# Patient Record
Sex: Male | Born: 1970 | Race: White | Hispanic: No | Smoking: Never smoker
Health system: Southern US, Community
[De-identification: ages and names within clinical notes are randomized; demographics above are authoritative.]

## PROBLEM LIST (undated history)

## (undated) DIAGNOSIS — E119 Type 2 diabetes mellitus without complications: Secondary | ICD-10-CM

## (undated) DIAGNOSIS — C801 Malignant (primary) neoplasm, unspecified: Secondary | ICD-10-CM

## (undated) DIAGNOSIS — L4050 Arthropathic psoriasis, unspecified: Secondary | ICD-10-CM

## (undated) DIAGNOSIS — I499 Cardiac arrhythmia, unspecified: Secondary | ICD-10-CM

## (undated) DIAGNOSIS — I471 Supraventricular tachycardia, unspecified: Secondary | ICD-10-CM

## (undated) DIAGNOSIS — M1990 Unspecified osteoarthritis, unspecified site: Secondary | ICD-10-CM

## (undated) DIAGNOSIS — I10 Essential (primary) hypertension: Secondary | ICD-10-CM

## (undated) DIAGNOSIS — K219 Gastro-esophageal reflux disease without esophagitis: Secondary | ICD-10-CM

## (undated) DIAGNOSIS — Z794 Long term (current) use of insulin: Secondary | ICD-10-CM

## (undated) DIAGNOSIS — E781 Pure hyperglyceridemia: Secondary | ICD-10-CM

## (undated) DIAGNOSIS — Z8042 Family history of malignant neoplasm of prostate: Secondary | ICD-10-CM

## (undated) DIAGNOSIS — F419 Anxiety disorder, unspecified: Secondary | ICD-10-CM

## (undated) DIAGNOSIS — Z800 Family history of malignant neoplasm of digestive organs: Secondary | ICD-10-CM

## (undated) HISTORY — DX: Family history of malignant neoplasm of digestive organs: Z80.0

## (undated) HISTORY — DX: Essential (primary) hypertension: I10

## (undated) HISTORY — PX: UPPER GASTROINTESTINAL ENDOSCOPY: SHX188

## (undated) HISTORY — DX: Type 2 diabetes mellitus without complications: E11.9

## (undated) HISTORY — DX: Supraventricular tachycardia, unspecified: I47.10

## (undated) HISTORY — DX: Supraventricular tachycardia: I47.1

## (undated) HISTORY — DX: Malignant (primary) neoplasm, unspecified: C80.1

## (undated) HISTORY — DX: Gastro-esophageal reflux disease without esophagitis: K21.9

## (undated) HISTORY — DX: Type 2 diabetes mellitus without complications: Z79.4

## (undated) HISTORY — DX: Family history of malignant neoplasm of prostate: Z80.42

## (undated) HISTORY — DX: Pure hyperglyceridemia: E78.1

---
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
## 2019-02-23 ENCOUNTER — Other Ambulatory Visit: Payer: Self-pay

## 2019-02-27 ENCOUNTER — Encounter: Payer: Self-pay | Admitting: Family Medicine

## 2019-02-27 ENCOUNTER — Other Ambulatory Visit: Payer: Self-pay

## 2019-02-27 ENCOUNTER — Ambulatory Visit: Payer: Managed Care, Other (non HMO) | Admitting: Family Medicine

## 2019-02-27 VITALS — BP 160/82 | HR 97 | Temp 97.6°F | Resp 18 | Ht 67.5 in | Wt 276.0 lb

## 2019-02-27 DIAGNOSIS — Z23 Encounter for immunization: Secondary | ICD-10-CM | POA: Diagnosis not present

## 2019-02-27 DIAGNOSIS — L4050 Arthropathic psoriasis, unspecified: Secondary | ICD-10-CM

## 2019-02-27 DIAGNOSIS — E119 Type 2 diabetes mellitus without complications: Secondary | ICD-10-CM

## 2019-02-27 DIAGNOSIS — I10 Essential (primary) hypertension: Secondary | ICD-10-CM | POA: Diagnosis not present

## 2019-02-27 DIAGNOSIS — Z794 Long term (current) use of insulin: Secondary | ICD-10-CM | POA: Insufficient documentation

## 2019-02-27 DIAGNOSIS — I471 Supraventricular tachycardia: Secondary | ICD-10-CM | POA: Diagnosis not present

## 2019-02-27 MED ORDER — AMLODIPINE BESYLATE 5 MG PO TABS: 5.0000 mg | ORAL_TABLET | Freq: Every day | ORAL | 1 refills | Status: DC

## 2019-02-27 MED ORDER — GLIPIZIDE 10 MG PO TABS: 10.0000 mg | ORAL_TABLET | Freq: Every day | ORAL | 1 refills | Status: DC

## 2019-02-27 MED ORDER — LANTUS SOLOSTAR 100 UNIT/ML ~~LOC~~ SOPN: 50.0000 [IU] | PEN_INJECTOR | Freq: Every day | SUBCUTANEOUS | 3 refills | Status: DC

## 2019-02-27 MED ORDER — METOPROLOL SUCCINATE ER 100 MG PO TB24: 100.0000 mg | ORAL_TABLET | Freq: Every day | ORAL | 1 refills | Status: DC

## 2019-02-27 MED ORDER — HYDROCHLOROTHIAZIDE 25 MG PO TABS: 25.0000 mg | ORAL_TABLET | Freq: Every day | ORAL | 1 refills | Status: DC

## 2019-02-27 NOTE — Progress Notes (Signed)
Subjective:    Patient ID: Dominic [name], male    DOB: 05-06-1971, 48 y.o.   MRN: [id number]  HPI   Patient presents to clinic to establish with PCP.  He recently moved to area from [facility].  He has a history of type 2 diabetes, hypertension, SVT and psoriatic arthritis.  States his last A1c a little over 3 months ago was 9%, this was before he was on his current diabetes medication.  Currently is on glipizide 10 mg/day and Lantus 50 units at night.  For his blood pressure he takes amlodipine/HCTZ and for SVT & Blood pressure he uses metoprolol.  States he has never seen a cardiologist.  He does follow regularly with rheumatology who prescribes him his Humira, Duobrii Topical and quinapril.  Past medical, social, surgical family history reviewed and updated accordingly in chart  Patient Active Problem List   Diagnosis Date Noted  . Type 2 diabetes mellitus without complication, with long-term current use of insulin ([place]) 02/27/2019  . SVT (supraventricular tachycardia) ([place]) 02/27/2019  . Essential hypertension 02/27/2019  . Psoriatic arthritis ([place]) 02/27/2019   Social History   Tobacco Use  . Smoking status: Never Smoker  . Smokeless tobacco: Never Used  Substance Use Topics  . Alcohol use: Yes   Family History  Problem Relation Age of Onset  . Arthritis Mother   . Heart disease Mother   . Diabetes Father    History reviewed. No pertinent surgical history.   Review of Systems  Constitutional: Negative for chills, fatigue and fever.  HENT: Negative for congestion, ear pain, sinus pain and sore throat.   Eyes: Negative.   Respiratory: Negative for cough, shortness of breath and wheezing.   Cardiovascular: Negative for chest pain, palpitations and leg swelling.  Gastrointestinal: Negative for abdominal pain, diarrhea, nausea and vomiting.  Genitourinary: Negative for dysuria, frequency and urgency.  Musculoskeletal: Negative for arthralgias and  myalgias.  Skin: Negative for color change, pallor and rash.  Neurological: Negative for syncope, light-headedness and headaches.  Psychiatric/Behavioral: The patient is not nervous/anxious.       Objective:   Physical Exam Vitals signs and nursing note reviewed.  Constitutional:      General: He is not in acute distress.    Appearance: He is not toxic-appearing.  HENT:     Head: Normocephalic and atraumatic.  Eyes:     General: No scleral icterus.    Extraocular Movements: Extraocular movements intact.     Pupils: Pupils are equal, round, and reactive to light.  Cardiovascular:     Rate and Rhythm: Normal rate and regular rhythm.  Pulmonary:     Effort: Pulmonary effort is normal. No respiratory distress.     Breath sounds: Normal breath sounds. No stridor.  Abdominal:     General: Bowel sounds are normal. There is no distension.     Palpations: Abdomen is soft.     Tenderness: There is no abdominal tenderness. There is no guarding or rebound.  Musculoskeletal:     Right lower leg: No edema.     Left lower leg: No edema.  Skin:    General: Skin is warm and dry.     Coloration: Skin is not jaundiced or pale.     Findings: Rash (left knee, psoriarsis) present.  Neurological:     General: No focal deficit present.     Mental Status: He is alert and oriented to person, place, and time.  Psychiatric:  Mood and Affect: Mood normal.        Behavior: Behavior normal.        Thought Content: Thought content normal.    Today's Vitals   02/27/19 0815 02/27/19 0832  BP: (!) 142/84 (!) 160/82  Pulse:  97  Resp:  18  Temp:  97.6 F (36.4 C)  TempSrc:  Temporal  SpO2:  96%  Weight:  276 lb (125.2 kg)  Height:  5' 7.5" (1.715 m)   Body mass index is 42.59 kg/m.     Assessment & Plan:    Type 2 diabetes-she will continue glipizide and Lantus at this time.  We will get new blood work and make any medication adjustments once we have new labs.  Hypertension/SVT-she  will continue metoprolol to control SVT and will continue amlodipine and hydrochlorothiazide for hypertension.  Psoriatic arthritis- he will continue regular follow-up with rheumatology.  Patient is a Armed forces logistics/support/administrative officer, lab orders placed and he will go to [facility] to get them drawn.  Tdap given in clinic today  Reviewed healthy diet including monitoring carbohydrates, salt intake, lots of lean meats and vegetables to help promote good blood pressure, good blood sugar and weight loss.  Recommended physical activity including walking, some sort of aerobic activity at least 3 to 5 days a week and also lifting small weights.  We will base next follow-up appointment off of lab results, most likely will follow-up in the next 1 to 2 months.

## 2019-02-27 NOTE — Patient Instructions (Signed)
Get labs at [facility]   We will base new meds and next follow up on lab results

## 2019-03-03 ENCOUNTER — Telehealth: Payer: Self-pay | Admitting: *Deleted

## 2019-03-03 ENCOUNTER — Other Ambulatory Visit: Payer: Self-pay | Admitting: Family Medicine

## 2019-03-03 DIAGNOSIS — I10 Essential (primary) hypertension: Secondary | ICD-10-CM

## 2019-03-03 DIAGNOSIS — E119 Type 2 diabetes mellitus without complications: Secondary | ICD-10-CM

## 2019-03-03 DIAGNOSIS — I471 Supraventricular tachycardia: Secondary | ICD-10-CM

## 2019-03-03 NOTE — Progress Notes (Signed)
Orders changed to no not future for [facility]

## 2019-03-03 NOTE — Telephone Encounter (Signed)
Copied from [place] [phone] [phone]. Topic: General - Other >> Mar 03, 2019 10:05 AM Rayann Heman wrote: Reason for CRM: pt called and stated that he needs to have lab work done and an order needed to be sent to [facility] but they have not received. Please advise

## 2019-03-03 NOTE — Telephone Encounter (Signed)
New orders in. Not sure if they need to be faxed.

## 2019-03-04 LAB — CBC WITH DIFFERENTIAL/PLATELET
Basophils Absolute: 0.1 10*3/uL (ref 0.0–0.2)
Basos: 1 %
EOS (ABSOLUTE): 0.2 10*3/uL (ref 0.0–0.4)
Eos: 2 %
Hematocrit: 48.8 % (ref 37.5–51.0)
Hemoglobin: 16.2 g/dL (ref 13.0–17.7)
Immature Grans (Abs): 0 10*3/uL (ref 0.0–0.1)
Immature Granulocytes: 0 %
Lymphocytes Absolute: 2.5 10*3/uL (ref 0.7–3.1)
Lymphs: 27 %
MCH: 28.6 pg (ref 26.6–33.0)
MCHC: 33.2 g/dL (ref 31.5–35.7)
MCV: 86 fL (ref 79–97)
Monocytes Absolute: 0.5 10*3/uL (ref 0.1–0.9)
Monocytes: 6 %
Neutrophils Absolute: 6.1 10*3/uL (ref 1.4–7.0)
Neutrophils: 64 %
Platelets: 259 10*3/uL (ref 150–450)
RBC: 5.66 x10E6/uL (ref 4.14–5.80)
RDW: 13.6 % (ref 11.6–15.4)
WBC: 9.3 10*3/uL (ref 3.4–10.8)

## 2019-03-04 LAB — COMPREHENSIVE METABOLIC PANEL
ALT: 18 IU/L (ref 0–44)
AST: 13 IU/L (ref 0–40)
Albumin/Globulin Ratio: 1.9 (ref 1.2–2.2)
Albumin: 4.5 g/dL (ref 4.0–5.0)
Alkaline Phosphatase: 79 IU/L (ref 39–117)
BUN/Creatinine Ratio: 19 (ref 9–20)
BUN: 15 mg/dL (ref 6–24)
Bilirubin Total: 0.6 mg/dL (ref 0.0–1.2)
CO2: 24 mmol/L (ref 20–29)
Calcium: 9.4 mg/dL (ref 8.7–10.2)
Chloride: 98 mmol/L (ref 96–106)
Creatinine, Ser: 0.81 mg/dL (ref 0.76–1.27)
GFR calc Af Amer: 121 mL/min/{1.73_m2} (ref >59)
GFR calc non Af Amer: 104 mL/min/{1.73_m2} (ref >59)
Globulin, Total: 2.4 g/dL (ref 1.5–4.5)
Glucose: 165 mg/dL — ABNORMAL HIGH (ref 65–99)
Potassium: 4 mmol/L (ref 3.5–5.2)
Sodium: 138 mmol/L (ref 134–144)
Total Protein: 6.9 g/dL (ref 6.0–8.5)

## 2019-03-04 LAB — HEMOGLOBIN A1C
Est. average glucose Bld gHb Est-mCnc: 163 mg/dL
Hgb A1c MFr Bld: 7.3 % — ABNORMAL HIGH (ref 4.8–5.6)

## 2019-03-04 LAB — VITAMIN D 25 HYDROXY (VIT D DEFICIENCY, FRACTURES): Vit D, 25-Hydroxy: 34.8 ng/mL (ref 30.0–100.0)

## 2019-03-04 LAB — LIPID PANEL
Chol/HDL Ratio: 4.7 ratio (ref 0.0–5.0)
Cholesterol, Total: 161 mg/dL (ref 100–199)
HDL: 34 mg/dL — ABNORMAL LOW (ref >39)
LDL Calculated: 88 mg/dL (ref 0–99)
Triglycerides: 197 mg/dL — ABNORMAL HIGH (ref 0–149)
VLDL Cholesterol Cal: 39 mg/dL (ref 5–40)

## 2019-03-06 ENCOUNTER — Telehealth: Payer: Self-pay | Admitting: Lab

## 2019-03-06 DIAGNOSIS — I10 Essential (primary) hypertension: Secondary | ICD-10-CM

## 2019-03-06 DIAGNOSIS — E119 Type 2 diabetes mellitus without complications: Secondary | ICD-10-CM

## 2019-03-06 MED ORDER — AMLODIPINE BESYLATE 5 MG PO TABS: 5.0000 mg | ORAL_TABLET | Freq: Every day | ORAL | 1 refills | Status: DC

## 2019-03-06 MED ORDER — GLIPIZIDE 10 MG PO TABS: 10.0000 mg | ORAL_TABLET | Freq: Every day | ORAL | 1 refills | Status: DC

## 2019-03-06 NOTE — Telephone Encounter (Signed)
Called Pt with results he stated he understood. Pt stated he will need more of the Glipizide since he's taking 2 a day instead of 1 a day. Pt also stated th Amlodapine was not sent into the pharmacy yet.

## 2019-03-06 NOTE — Telephone Encounter (Signed)
Refills sent to his mail away pharmacy

## 2019-03-08 ENCOUNTER — Other Ambulatory Visit: Payer: Self-pay | Admitting: Lab

## 2019-03-08 ENCOUNTER — Telehealth: Payer: Self-pay | Admitting: Family Medicine

## 2019-03-08 MED ORDER — QUINAPRIL HCL 40 MG PO TABS: 40.0000 mg | ORAL_TABLET | Freq: Every day | ORAL | 1 refills | Status: DC

## 2019-03-08 NOTE — Telephone Encounter (Signed)
Pt stated that he needs a refill on quinapril (ACCUPRIL) 40 MG tablet sent to his mail order

## 2019-03-13 ENCOUNTER — Other Ambulatory Visit: Payer: Self-pay | Admitting: Lab

## 2019-03-13 MED ORDER — QUINAPRIL HCL 40 MG PO TABS: 40.0000 mg | ORAL_TABLET | Freq: Every day | ORAL | 1 refills | Status: DC

## 2019-03-13 NOTE — Telephone Encounter (Signed)
Rx sent to mail order

## 2019-04-14 NOTE — Progress Notes (Deleted)
   New Outpatient Visit Date: 04/17/2019  Referring Provider: Jodelle Green, FNP [address] [place],  [place] [address]  Chief Complaint: ***  HPI:  Dominic [name] is a 48 y.o. male who is being seen today for the evaluation of SVT at the request of Guse, Jacquelynn Cree, FNP. He has a history of SVT, hypertension, type 2 diabetes mellitus, and psoriatic arthritis.  He recently moved to the area from [place], [place].    --------------------------------------------------------------------------------------------------  Cardiovascular History & Procedures: Cardiovascular Problems:  ***  Risk Factors:  ***  Cath/PCI:  ***  CV Surgery:  ***  EP Procedures and Devices:  ***  Non-Invasive Evaluation(s):  ***  Recent CV Pertinent Labs: Lab Results  Component Value Date   CHOL 161 03/03/2019   HDL 34 (L) 03/03/2019   LDLCALC 88 03/03/2019   TRIG 197 (H) 03/03/2019   CHOLHDL 4.7 03/03/2019   K 4.0 03/03/2019   BUN 15 03/03/2019   CREATININE 0.81 03/03/2019    --------------------------------------------------------------------------------------------------  Past Medical History:  Diagnosis Date  . Arthritis   . Diabetes mellitus without complication ([place])   . Hypertension     No past surgical history on file.  No outpatient medications have been marked as taking for the 04/17/19 encounter (Appointment) with Beaux Wedemeyer, Dominic Gave, MD.    Allergies: Patient has no allergy information on record.  Social History   Tobacco Use  . Smoking status: Never Smoker  . Smokeless tobacco: Never Used  Substance Use Topics  . Alcohol use: Yes  . Drug use: Never    Family History  Problem Relation Age of Onset  . Arthritis Mother   . Heart disease Mother   . Diabetes Father     Review of Systems: A 12-system review of systems was performed and was negative except as noted in the HPI.   --------------------------------------------------------------------------------------------------  Physical Exam: There were no vitals taken for this visit.  General:  *** HEENT: No conjunctival pallor or scleral icterus. Moist mucous membranes. OP clear. Neck: Supple without lymphadenopathy, thyromegaly, JVD, or HJR. No carotid bruit. Lungs: Normal work of breathing. Clear to auscultation bilaterally without wheezes or crackles. Heart: Regular rate and rhythm without murmurs, rubs, or gallops. Non-displaced PMI. Abd: Bowel sounds present. Soft, NT/ND without hepatosplenomegaly Ext: No lower extremity edema. Radial, PT, and DP pulses are 2+ bilaterally Skin: Warm and dry without rash. Neuro: CNIII-XII intact. Strength and fine-touch sensation intact in upper and lower extremities bilaterally. Psych: Normal mood and affect.  EKG:  ***  Lab Results  Component Value Date   WBC 9.3 03/03/2019   HGB 16.2 03/03/2019   HCT 48.8 03/03/2019   MCV 86 03/03/2019   PLT 259 03/03/2019    Lab Results  Component Value Date   NA 138 03/03/2019   K 4.0 03/03/2019   CL 98 03/03/2019   CO2 24 03/03/2019   BUN 15 03/03/2019   CREATININE 0.81 03/03/2019   GLUCOSE 165 (H) 03/03/2019   ALT 18 03/03/2019    Lab Results  Component Value Date   CHOL 161 03/03/2019   HDL 34 (L) 03/03/2019   LDLCALC 88 03/03/2019   TRIG 197 (H) 03/03/2019   CHOLHDL 4.7 03/03/2019     --------------------------------------------------------------------------------------------------  ASSESSMENT AND PLAN: Dominic [name] Marilouise Densmore, MD 04/14/2019 2:19 PM

## 2019-04-17 ENCOUNTER — Ambulatory Visit: Payer: Managed Care, Other (non HMO) | Admitting: Internal Medicine

## 2019-04-24 NOTE — Progress Notes (Deleted)
   New Outpatient Visit Date: 04/26/2019  Referring Provider: Jodelle Green, FNP [address] [place],  [place] [address]  Chief Complaint: ***  HPI:  Dominic [name] is a 48 y.o. male who is being seen today for the evaluation of SVT at the request of Guse, Jacquelynn Cree, FNP. He has a history of SVT, hypertension, type 2 diabetes mellitus, and psoriatic arthritis.  He recently moved to the area from [place], [place].    --------------------------------------------------------------------------------------------------  Cardiovascular History & Procedures: Cardiovascular Problems:  ***  Risk Factors:  ***  Cath/PCI:  ***  CV Surgery:  ***  EP Procedures and Devices:  ***  Non-Invasive Evaluation(s):  ***  Recent CV Pertinent Labs: Lab Results  Component Value Date   CHOL 161 03/03/2019   HDL 34 (L) 03/03/2019   LDLCALC 88 03/03/2019   TRIG 197 (H) 03/03/2019   CHOLHDL 4.7 03/03/2019   K 4.0 03/03/2019   BUN 15 03/03/2019   CREATININE 0.81 03/03/2019    --------------------------------------------------------------------------------------------------  Past Medical History:  Diagnosis Date  . Arthritis   . Diabetes mellitus without complication ([place])   . Hypertension     No past surgical history on file.  No outpatient medications have been marked as taking for the 04/26/19 encounter (Appointment) with Norma Ignasiak, Dominic Gave, MD.    Allergies: Patient has no allergy information on record.  Social History   Tobacco Use  . Smoking status: Never Smoker  . Smokeless tobacco: Never Used  Substance Use Topics  . Alcohol use: Yes  . Drug use: Never    Family History  Problem Relation Age of Onset  . Arthritis Mother   . Heart disease Mother   . Diabetes Father     Review of Systems: A 12-system review of systems was performed and was negative except as noted in the HPI.   --------------------------------------------------------------------------------------------------  Physical Exam: There were no vitals taken for this visit.  General:  *** HEENT: No conjunctival pallor or scleral icterus. Moist mucous membranes. OP clear. Neck: Supple without lymphadenopathy, thyromegaly, JVD, or HJR. No carotid bruit. Lungs: Normal work of breathing. Clear to auscultation bilaterally without wheezes or crackles. Heart: Regular rate and rhythm without murmurs, rubs, or gallops. Non-displaced PMI. Abd: Bowel sounds present. Soft, NT/ND without hepatosplenomegaly Ext: No lower extremity edema. Radial, PT, and DP pulses are 2+ bilaterally Skin: Warm and dry without rash. Neuro: CNIII-XII intact. Strength and fine-touch sensation intact in upper and lower extremities bilaterally. Psych: Normal mood and affect.  EKG:  ***  Lab Results  Component Value Date   WBC 9.3 03/03/2019   HGB 16.2 03/03/2019   HCT 48.8 03/03/2019   MCV 86 03/03/2019   PLT 259 03/03/2019    Lab Results  Component Value Date   NA 138 03/03/2019   K 4.0 03/03/2019   CL 98 03/03/2019   CO2 24 03/03/2019   BUN 15 03/03/2019   CREATININE 0.81 03/03/2019   GLUCOSE 165 (H) 03/03/2019   ALT 18 03/03/2019    Lab Results  Component Value Date   CHOL 161 03/03/2019   HDL 34 (L) 03/03/2019   LDLCALC 88 03/03/2019   TRIG 197 (H) 03/03/2019   CHOLHDL 4.7 03/03/2019     --------------------------------------------------------------------------------------------------  ASSESSMENT AND PLAN: Dominic Gave Eliani Leclere, MD 04/24/2019 7:53 AM

## 2019-04-26 ENCOUNTER — Ambulatory Visit: Payer: Managed Care, Other (non HMO) | Admitting: Internal Medicine

## 2019-05-05 ENCOUNTER — Telehealth: Payer: Self-pay | Admitting: Family Medicine

## 2019-05-05 ENCOUNTER — Encounter: Payer: Self-pay | Admitting: Family Medicine

## 2019-05-05 NOTE — Telephone Encounter (Signed)
Left two messages to reschedule pt appt for 11/23.[facility] Kitchen letter was mailed out and appt was cancelled

## 2019-06-05 ENCOUNTER — Ambulatory Visit: Payer: Managed Care, Other (non HMO) | Admitting: Family Medicine

## 2019-06-23 ENCOUNTER — Ambulatory Visit: Payer: Managed Care, Other (non HMO) | Admitting: Cardiology

## 2019-06-30 ENCOUNTER — Other Ambulatory Visit: Payer: Self-pay | Admitting: *Deleted

## 2019-06-30 DIAGNOSIS — Z794 Long term (current) use of insulin: Secondary | ICD-10-CM

## 2019-06-30 DIAGNOSIS — E119 Type 2 diabetes mellitus without complications: Secondary | ICD-10-CM

## 2019-06-30 DIAGNOSIS — I471 Supraventricular tachycardia: Secondary | ICD-10-CM

## 2019-06-30 DIAGNOSIS — I10 Essential (primary) hypertension: Secondary | ICD-10-CM

## 2019-06-30 NOTE — Telephone Encounter (Signed)
Okay to refill? Former Guse patient

## 2019-07-02 MED ORDER — GLIPIZIDE 10 MG PO TABS: 10.0000 mg | ORAL_TABLET | Freq: Every day | ORAL | 1 refills | Status: DC

## 2019-07-02 MED ORDER — HYDROCHLOROTHIAZIDE 25 MG PO TABS: 25.0000 mg | ORAL_TABLET | Freq: Every day | ORAL | 1 refills | Status: DC

## 2019-07-02 MED ORDER — AMLODIPINE BESYLATE 5 MG PO TABS: 5.0000 mg | ORAL_TABLET | Freq: Every day | ORAL | 1 refills | Status: DC

## 2019-07-02 MED ORDER — METOPROLOL SUCCINATE ER 100 MG PO TB24: 100.0000 mg | ORAL_TABLET | Freq: Every day | ORAL | 1 refills | Status: DC

## 2019-07-13 ENCOUNTER — Other Ambulatory Visit: Payer: Self-pay | Admitting: Family Medicine

## 2019-07-13 ENCOUNTER — Ambulatory Visit: Payer: Managed Care, Other (non HMO) | Attending: Internal Medicine

## 2019-07-13 DIAGNOSIS — E119 Type 2 diabetes mellitus without complications: Secondary | ICD-10-CM

## 2019-07-13 DIAGNOSIS — Z20822 Contact with and (suspected) exposure to covid-19: Secondary | ICD-10-CM

## 2019-07-13 MED ORDER — LANTUS SOLOSTAR 100 UNIT/ML ~~LOC~~ SOPN: 50.0000 [IU] | PEN_INJECTOR | Freq: Every day | SUBCUTANEOUS | 0 refills | Status: DC

## 2019-07-13 NOTE — Telephone Encounter (Signed)
Sent to pharmacy 

## 2019-07-13 NOTE — Telephone Encounter (Signed)
Patient has an appt to transfer care to [facility] on 07/18/19 last OV 8/20 ok to fill lantus.

## 2019-07-13 NOTE — Telephone Encounter (Signed)
[facility] told pt that we needed to send in a rx for Insulin Glargine (LANTUS SOLOSTAR) 100 UNIT/ML Solostar Pen for a 3 month refill

## 2019-07-15 LAB — NOVEL CORONAVIRUS, NAA: SARS-CoV-2, NAA: DETECTED — AB

## 2019-07-17 ENCOUNTER — Ambulatory Visit: Payer: Managed Care, Other (non HMO) | Admitting: Family

## 2019-07-17 NOTE — Progress Notes (Signed)
Virtual Visit via Video Note  I connected with@  on 07/18/19 at 11:30 AM EST by a video enabled telemedicine application and verified that I am speaking with the correct person using two identifiers.  Location patient: home Location provider:work  Persons participating in the virtual visit: patient, provider  I discussed the limitations of evaluation and management by telemedicine and the availability of in person appointments. The patient expressed understanding and agreed to proceed.   HPI: Establish care  Chief complaint of nausea  headache x 6 days.  Diarrhea x 3 days. Brown liquid , non bloody. 12 or more episodes. Has been taking pepto bismal with some relief. No vomiting. Mouth is moist. Urine is clear. NO decrease in urine. Drinking fluids, plans to start pedialyte. Has a roommate who is undergoing covid testing.  Loss of sense of taste x2 days. No eating much today.   Endorses chills, sore throat.  No sob, cough, fever.  NO h/o lung disease.   COVID positive 12/31; works for [employer].  Psoriatic arthritis- on [place]; follows with rheumatology, [facility].   SVT- felt palpitations today when waking up which is 'normal' for him. Wanders if related to diarrhea. HR was 105 this morning. Never been to cardiologist, diagnosed by PCP in [place], [place] and started on toprol. No associated CP, SOB, left arm numbness.   HTN - compliant with quinapril and glipizide.   DM- FBG has been 300's 3 days ago, improved to 234 today.Prior to covid FBG 180. Diet has worsened of late.  Polyuria has resolved. On lantus 50 units qpm.  Had been on metformin in the past however had diarrhea.  Had been on glipizide. Last a1c 7.3. No hypoglycemic episodes.   No decreased urinary, hesitancy.    ROS: See pertinent positives and negatives per HPI.  Past Medical History:  Diagnosis Date  . Arthritis   . Diabetes mellitus without complication ([place])   . Hypertension     History reviewed. No  pertinent surgical history.  Family History  Problem Relation Age of Onset  . Arthritis Mother   . Heart disease Mother   . Diabetes Father   . Prostate cancer Neg Hx     SOCIAL HX: never smoker   Current Outpatient Medications:  .  Adalimumab (HUMIRA PEN) 40 MG/0.4ML PNKT, Inject into the skin., Disp: , Rfl:  .  amLODipine (NORVASC) 5 MG tablet, Take 1 tablet (5 mg total) by mouth daily., Disp: 90 tablet, Rfl: 1 .  glipiZIDE (GLUCOTROL) 10 MG tablet, Take 1 tablet (10 mg total) by mouth daily before breakfast., Disp: 90 tablet, Rfl: 1 .  hydrochlorothiazide (HYDRODIURIL) 25 MG tablet, Take 1 tablet (25 mg total) by mouth daily., Disp: 90 tablet, Rfl: 1 .  Insulin Glargine (LANTUS SOLOSTAR) 100 UNIT/ML Solostar Pen, Inject 50 Units into the skin daily., Disp: 45 mL, Rfl: 0 .  metoprolol succinate (TOPROL-XL) 100 MG 24 hr tablet, Take 1 tablet (100 mg total) by mouth daily. Take with or immediately following a meal., Disp: 90 tablet, Rfl: 1 .  quinapril (ACCUPRIL) 40 MG tablet, Take 1 tablet (40 mg total) by mouth at bedtime., Disp: 90 tablet, Rfl: 1 .  Halobetasol Prop-Tazarotene (DUOBRII) 0.01-0.045 % LOTN, Apply topically., Disp: , Rfl:   EXAM:  VITALS per patient if applicable: BP Readings from Last 3 Encounters:  02/27/19 (!) 160/82    GENERAL: alert, oriented, appears well and in no acute distress  HEENT: atraumatic, conjunttiva clear, no obvious abnormalities on inspection of external  nose and ears  NECK: normal movements of the head and neck  LUNGS: on inspection no signs of respiratory distress, breathing rate appears normal, no obvious gross SOB, gasping or wheezing  CV: no obvious cyanosis  MS: moves all visible extremities without noticeable abnormality  PSYCH/NEURO: pleasant and cooperative, no obvious depression or anxiety, speech and thought processing grossly intact  ASSESSMENT AND PLAN:  Discussed the following assessment and plan: Problem List Items  Addressed This Visit      Cardiovascular and Mediastinum   SVT (supraventricular tachycardia) (HCC)    History of SVT.  An episode earlier today with heart rate 105.  Nnonsustained.  Patient describes typical for him to experience especially when he is sick.  no formal cardiology consult.  I placed referral today.  I shared my concern in regards to patient being dehydrated from frequent diarrhea, 12 episodes per day, and I am concerned that he may need stat electrolytes checked, EKG and possibly IV fluids.  Patient declines recommendation to go to emergency room.  He was going to have labs done through his employer, [facility], which is ordered today.  Advised him to have these done ASAP so I can look for electrolyte abnormalities.  He will stay vigilant in regards to any recurrence of palpitations, symptom onset of shortness of breath or chest pain.  If occurs, he understands to go directly to emergency room.       Relevant Orders   Comprehensive metabolic panel-FUTURE   TSH-future     Endocrine   Type 2 diabetes mellitus without complication, with long-term current use of insulin ([place])    Uncontrolled. Suspect stress response and failure to adhere to diabetic diet. Advised to stay on current dose of lantus with close monitoring of FBG, symptoms.  Hold glipizide if not eating regular meal. Close follow up and we will make further changes to his diabetic regimen once he recovers from illness and I have his A1c.      Relevant Orders   CBC with Differential/Platelet   Hemoglobin A1c   Lipid panel-future     Musculoskeletal and Integument   Psoriatic arthritis ([place])     Other   COVID-19 - Primary    Reassured as patient does not have any gross respiratory symptoms including shortness of breath, difficulty breathing.  He appears to have more of a GI presentation of COVID-19.  Based on his comorbidities, and that he is immunocompromised, we have made him an appointment with respiratory clinic  for tomorrow.  He is also under the covid home care monitoring      Relevant Orders   MyChart COVID-19 home monitoring program   Temperature monitoring   Ambulatory referral to Cardiology    Other Visit Diagnoses    Screening for prostate cancer       Relevant Orders   PSA-PSA-  AA start at 68, White 49 yo      -we discussed possible serious and likely etiologies, options for evaluation and workup, limitations of telemedicine visit vs in person visit, treatment, treatment risks and precautions. Pt prefers to treat via telemedicine empirically rather then risking or undertaking an in person visit at this moment. Patient agrees to seek prompt in person care if worsening, new symptoms arise, or if is not improving with treatment.   I discussed the assessment and treatment plan with the patient. The patient was provided an opportunity to ask questions and all were answered. The patient agreed with the plan and demonstrated an understanding  of the instructions.   The patient was advised to call back or seek an in-person evaluation if the symptoms worsen or if the condition fails to improve as anticipated.   Mable Paris, FNP

## 2019-07-18 ENCOUNTER — Other Ambulatory Visit: Payer: Self-pay

## 2019-07-18 ENCOUNTER — Encounter: Payer: Self-pay | Admitting: Family

## 2019-07-18 ENCOUNTER — Ambulatory Visit (INDEPENDENT_AMBULATORY_CARE_PROVIDER_SITE_OTHER): Payer: Managed Care, Other (non HMO) | Admitting: Family

## 2019-07-18 VITALS — HR 105 | Temp 97.9°F | Ht 67.5 in | Wt 280.6 lb

## 2019-07-18 DIAGNOSIS — Z794 Long term (current) use of insulin: Secondary | ICD-10-CM

## 2019-07-18 DIAGNOSIS — I471 Supraventricular tachycardia, unspecified: Secondary | ICD-10-CM

## 2019-07-18 DIAGNOSIS — L4050 Arthropathic psoriasis, unspecified: Secondary | ICD-10-CM

## 2019-07-18 DIAGNOSIS — E119 Type 2 diabetes mellitus without complications: Secondary | ICD-10-CM

## 2019-07-18 DIAGNOSIS — U071 COVID-19: Secondary | ICD-10-CM

## 2019-07-18 DIAGNOSIS — Z125 Encounter for screening for malignant neoplasm of prostate: Secondary | ICD-10-CM

## 2019-07-18 NOTE — Assessment & Plan Note (Addendum)
History of SVT.  An episode earlier today with heart rate 105.  Nnonsustained.  Patient describes typical for him to experience especially when he is sick.  no formal cardiology consult.  I placed referral today.  I shared my concern in regards to patient being dehydrated from frequent diarrhea, 12 episodes per day, and I am concerned that he may need stat electrolytes checked, EKG and possibly IV fluids.  Patient declines recommendation to go to emergency room.  He was going to have labs done through his employer, [facility], which is ordered today.  Advised him to have these done ASAP so I can look for electrolyte abnormalities.  He will stay vigilant in regards to any recurrence of palpitations, symptom onset of shortness of breath or chest pain.  If occurs, he understands to go directly to emergency room.

## 2019-07-18 NOTE — Assessment & Plan Note (Addendum)
Reassured as patient does not have any gross respiratory symptoms including shortness of breath, difficulty breathing.  He appears to have more of a GI presentation of COVID-19.  Based on his comorbidities, and that he is immunocompromised, we have made him an appointment with respiratory clinic for tomorrow.  He is also under the covid home care monitoring

## 2019-07-18 NOTE — Patient Instructions (Signed)
Labs at [facility] today or tomorrow.  Go to emergency for repeat or sustained elevated heartrate Monitor blood sugar and hold glipizide if not eating normally.   Close follow up in one week and let me know how you are doing.

## 2019-07-18 NOTE — Assessment & Plan Note (Addendum)
Uncontrolled. Suspect stress response and failure to adhere to diabetic diet. Advised to stay on current dose of lantus with close monitoring of FBG, symptoms.  Hold glipizide if not eating regular meal. Close follow up and we will make further changes to his diabetic regimen once he recovers from illness and I have his A1c.

## 2019-07-19 ENCOUNTER — Encounter (INDEPENDENT_AMBULATORY_CARE_PROVIDER_SITE_OTHER): Payer: Self-pay

## 2019-07-19 ENCOUNTER — Encounter: Payer: Self-pay | Admitting: Family Medicine

## 2019-07-19 ENCOUNTER — Ambulatory Visit: Payer: Managed Care, Other (non HMO)

## 2019-07-20 LAB — CBC WITH DIFFERENTIAL/PLATELET
Basophils Absolute: 0 10*3/uL (ref 0.0–0.2)
Basos: 0 %
EOS (ABSOLUTE): 0.1 10*3/uL (ref 0.0–0.4)
Eos: 1 %
Hematocrit: 52.1 % — ABNORMAL HIGH (ref 37.5–51.0)
Hemoglobin: 17.8 g/dL — ABNORMAL HIGH (ref 13.0–17.7)
Immature Grans (Abs): 0.1 10*3/uL (ref 0.0–0.1)
Immature Granulocytes: 2 %
Lymphocytes Absolute: 1.9 10*3/uL (ref 0.7–3.1)
Lymphs: 31 %
MCH: 29.5 pg (ref 26.6–33.0)
MCHC: 34.2 g/dL (ref 31.5–35.7)
MCV: 86 fL (ref 79–97)
Monocytes Absolute: 0.6 10*3/uL (ref 0.1–0.9)
Monocytes: 11 %
Neutrophils Absolute: 3.4 10*3/uL (ref 1.4–7.0)
Neutrophils: 55 %
Platelets: 181 10*3/uL (ref 150–450)
RBC: 6.04 x10E6/uL — ABNORMAL HIGH (ref 4.14–5.80)
RDW: 13.6 % (ref 11.6–15.4)
WBC: 6.1 10*3/uL (ref 3.4–10.8)

## 2019-07-20 LAB — LIPID PANEL
Chol/HDL Ratio: 5.7 ratio — ABNORMAL HIGH (ref 0.0–5.0)
Cholesterol, Total: 149 mg/dL (ref 100–199)
HDL: 26 mg/dL — ABNORMAL LOW (ref >39)
LDL Chol Calc (NIH): 58 mg/dL (ref 0–99)
Triglycerides: 424 mg/dL — ABNORMAL HIGH (ref 0–149)
VLDL Cholesterol Cal: 65 mg/dL — ABNORMAL HIGH (ref 5–40)

## 2019-07-20 LAB — COMPREHENSIVE METABOLIC PANEL
ALT: 41 IU/L (ref 0–44)
AST: 28 IU/L (ref 0–40)
Albumin/Globulin Ratio: 1.2 (ref 1.2–2.2)
Albumin: 4.1 g/dL (ref 4.0–5.0)
Alkaline Phosphatase: 83 IU/L (ref 39–117)
BUN/Creatinine Ratio: 13 (ref 9–20)
BUN: 11 mg/dL (ref 6–24)
Bilirubin Total: 0.7 mg/dL (ref 0.0–1.2)
CO2: 24 mmol/L (ref 20–29)
Calcium: 9.1 mg/dL (ref 8.7–10.2)
Chloride: 92 mmol/L — ABNORMAL LOW (ref 96–106)
Creatinine, Ser: 0.85 mg/dL (ref 0.76–1.27)
GFR calc Af Amer: 118 mL/min/{1.73_m2} (ref >59)
GFR calc non Af Amer: 102 mL/min/{1.73_m2} (ref >59)
Globulin, Total: 3.4 g/dL (ref 1.5–4.5)
Glucose: 279 mg/dL — ABNORMAL HIGH (ref 65–99)
Potassium: 3.5 mmol/L (ref 3.5–5.2)
Sodium: 135 mmol/L (ref 134–144)
Total Protein: 7.5 g/dL (ref 6.0–8.5)

## 2019-07-20 LAB — HEMOGLOBIN A1C
Est. average glucose Bld gHb Est-mCnc: 246 mg/dL
Hgb A1c MFr Bld: 10.2 % — ABNORMAL HIGH (ref 4.8–5.6)

## 2019-07-20 LAB — PSA: Prostate Specific Ag, Serum: 0.4 ng/mL (ref 0.0–4.0)

## 2019-07-20 LAB — TSH: TSH: 0.861 u[IU]/mL (ref 0.450–4.500)

## 2019-07-21 ENCOUNTER — Encounter: Payer: Self-pay | Admitting: Family

## 2019-07-21 ENCOUNTER — Telehealth: Payer: Self-pay | Admitting: Family

## 2019-07-21 ENCOUNTER — Encounter (INDEPENDENT_AMBULATORY_CARE_PROVIDER_SITE_OTHER): Payer: Self-pay

## 2019-07-21 DIAGNOSIS — U071 COVID-19: Secondary | ICD-10-CM

## 2019-07-21 DIAGNOSIS — Z794 Long term (current) use of insulin: Secondary | ICD-10-CM

## 2019-07-21 DIAGNOSIS — E119 Type 2 diabetes mellitus without complications: Secondary | ICD-10-CM

## 2019-07-21 MED ORDER — ALBUTEROL SULFATE HFA 108 (90 BASE) MCG/ACT IN AERS: 2.0000 | INHALATION_SPRAY | Freq: Four times a day (QID) | RESPIRATORY_TRACT | 1 refills | Status: DC | PRN

## 2019-07-21 MED ORDER — ONETOUCH ULTRASOFT LANCETS MISC: 12 refills | Status: DC

## 2019-07-21 MED ORDER — GLUCOSE BLOOD VI STRP: 1.0000 | ORAL_STRIP | 3 refills | Status: DC | PRN

## 2019-07-21 MED ORDER — BENZONATATE 100 MG PO CAPS: 100.0000 mg | ORAL_CAPSULE | Freq: Three times a day (TID) | ORAL | 1 refills | Status: DC | PRN

## 2019-07-21 MED ORDER — GLIPIZIDE 10 MG PO TABS: 10.0000 mg | ORAL_TABLET | Freq: Two times a day (BID) | ORAL | 2 refills | Status: DC

## 2019-07-21 NOTE — Telephone Encounter (Signed)
Spoke with pt No respiratory distress, he is not labored in speech.  Sa02 96%.  No chest pain.  Dry coughing now, some wheezing over the past couple days Felt 'SVT' last night and now has 'calmed down'. Drinking plenty of water.  Done okay with albuterol in the past.  Would like to try for his cough,  Seeing Dr End 08/10/18. On 70 units lantus ( from 66).  Did not check fasting blood glucose this morning.  No blurry vision. Eating more normally today.  Thinks glipizide 10mg  BID was more helpful and would like ot resume this.   I have sent lancets, test strips and increased dose of glipizide.  Emphasized the importance of monitoring oxygen if any episodes of chest pain, palpitations, shortness of breath or difficulty breathing, patient understands immediately to emergency room

## 2019-07-21 NOTE — Telephone Encounter (Signed)
Margaret,   Doubt I'll have an opening in my schedule to talk to him until February. My team will outreach to schedule.   If he is having symptoms of hyperglycemia, would recommend titrating insulin to achieve goal BG, as faster than adding GLP1. If not symptomatic, could consider adding GLP1 now.

## 2019-07-21 NOTE — Telephone Encounter (Signed)
Catie, I placed referral for this patient to see you, I hope that is okay. His diabetes particularly poorly uncontrolled.  I wanted help in titrating his Lantus or even consideration of starting his new like a GLP-1. Should I start with lantus first?    I wanted to your advice , as always!

## 2019-07-24 ENCOUNTER — Telehealth: Payer: Self-pay | Admitting: Family Medicine

## 2019-07-24 NOTE — Chronic Care Management (AMB) (Signed)
  Care Management   Outreach Note  07/24/2019 Name: Dominic [name] MRN: [id number] DOB: Jan 10, 1971  Referred by: Jodelle Green, FNP Reason for referral : Care Management (CM Initial outreach unsuccessful)   An unsuccessful telephone outreach was attempted today. The patient was referred to the case management team by for assistance with care management and care coordination.   Follow Up Plan: A HIPPA compliant phone message was left for the patient providing contact information and requesting a return call.  The care management team will reach out to the patient again over the next 7 days.  If patient returns call to provider office, please advise to call Embedded Care Management Care Guide Glenna Durand LPN at [phone]  Emric Kowalewski, LPN Health Advisor, [place] Management ??[email or web address] ??[phone]

## 2019-07-25 ENCOUNTER — Other Ambulatory Visit: Payer: Self-pay

## 2019-07-25 DIAGNOSIS — E119 Type 2 diabetes mellitus without complications: Secondary | ICD-10-CM

## 2019-07-25 MED ORDER — ONETOUCH ULTRASOFT LANCETS MISC: 12 refills | Status: DC

## 2019-07-26 ENCOUNTER — Ambulatory Visit (INDEPENDENT_AMBULATORY_CARE_PROVIDER_SITE_OTHER): Payer: Managed Care, Other (non HMO) | Admitting: Family

## 2019-07-26 ENCOUNTER — Encounter: Payer: Self-pay | Admitting: Family

## 2019-07-26 DIAGNOSIS — I471 Supraventricular tachycardia: Secondary | ICD-10-CM | POA: Diagnosis not present

## 2019-07-26 DIAGNOSIS — U071 COVID-19: Secondary | ICD-10-CM | POA: Diagnosis not present

## 2019-07-26 DIAGNOSIS — E119 Type 2 diabetes mellitus without complications: Secondary | ICD-10-CM | POA: Diagnosis not present

## 2019-07-26 DIAGNOSIS — Z794 Long term (current) use of insulin: Secondary | ICD-10-CM

## 2019-07-26 DIAGNOSIS — I10 Essential (primary) hypertension: Secondary | ICD-10-CM

## 2019-07-26 NOTE — Patient Instructions (Signed)
Send me log blood pressure, as concerned we need make changes there.

## 2019-07-26 NOTE — Assessment & Plan Note (Signed)
Symptomatically improved.  Patient will let me know of any concerns

## 2019-07-26 NOTE — Assessment & Plan Note (Signed)
No recurrence of symptoms. Seeing cardiology this month. Will follow

## 2019-07-26 NOTE — Assessment & Plan Note (Signed)
Symptomatically appears somewhat improved however patient has not been checking blood sugars at home.  Advised him to check blood sugars TID and follow-up in 2 weeks to  discuss changes to his diabetic regimen

## 2019-07-26 NOTE — Assessment & Plan Note (Signed)
Advised to keep BP log and call with readings.  He verbalized understanding of the importance of this.

## 2019-07-26 NOTE — Progress Notes (Signed)
Virtual Visit via Video Note  I connected with@  on 07/26/19 at 12:00 PM EST by a video enabled telemedicine application and verified that I am speaking with the correct person using two identifiers.  Location patient: home Location provider:work  Persons participating in the virtual visit: patient, provider  I discussed the limitations of evaluation and management by telemedicine and the availability of in person appointments. The patient expressed understanding and agreed to proceed. Interactive audio and video telecommunications were attempted between this provider and patient, however failed, due to patient having technical difficulties or patient did not have access to video capability.  We continued and completed visit with audio only.    HPI: Follow up positive COVID.  At work today Feels 'much better'. Still has decreased taste of smell. Occasional cough. No fever.  Little winded from walking to parking lot to hospital this morning for work. Didn't feel he needed inhaler, hasnt used during covid course.  hasnt been able to check blood sugar as hasnt picked up lancets yet. Frequent urination has improved and feels blood sugar improved. Increased thirst as improved. Some dysuria, however this has resolved.Drinking plenty of water, increased today.   No further heart palpitations. Today HR 90 bpm. Sa02 96% at home.  Appt with Dr Saunders Revel 08/11/19.  ROS: See pertinent positives and negatives per HPI.  Past Medical History:  Diagnosis Date  . Arthritis   . Diabetes mellitus without complication ([facility])   . Hypertension     History reviewed. No pertinent surgical history.  Family History  Problem Relation Age of Onset  . Arthritis Mother   . Heart disease Mother   . Supraventricular tachycardia Mother        s/p ablation  . Diabetes Father   . Prostate cancer Neg Hx     SOCIAL HX: never smoker   Current Outpatient Medications:  .  Adalimumab (HUMIRA PEN) 40 MG/0.4ML PNKT,  Inject into the skin., Disp: , Rfl:  .  albuterol (VENTOLIN HFA) 108 (90 Base) MCG/ACT inhaler, Inhale 2 puffs into the lungs every 6 (six) hours as needed for wheezing or shortness of breath., Disp: 6.7 g, Rfl: 1 .  amLODipine (NORVASC) 5 MG tablet, Take 1 tablet (5 mg total) by mouth daily., Disp: 90 tablet, Rfl: 1 .  benzonatate (TESSALON PERLES) 100 MG capsule, Take 1 capsule (100 mg total) by mouth 3 (three) times daily as needed for cough., Disp: 30 capsule, Rfl: 1 .  glipiZIDE (GLUCOTROL) 10 MG tablet, Take 1 tablet (10 mg total) by mouth 2 (two) times daily before a meal., Disp: 120 tablet, Rfl: 2 .  glucose blood test strip, 1 each by Other route as needed for other. Use to test blood sugar twice daily., Disp: 100 each, Rfl: 3 .  Halobetasol Prop-Tazarotene (DUOBRII) 0.01-0.045 % LOTN, Apply topically., Disp: , Rfl:  .  hydrochlorothiazide (HYDRODIURIL) 25 MG tablet, Take 1 tablet (25 mg total) by mouth daily., Disp: 90 tablet, Rfl: 1 .  Insulin Glargine (LANTUS SOLOSTAR) 100 UNIT/ML Solostar Pen, Inject 50 Units into the skin daily., Disp: 45 mL, Rfl: 0 .  Lancets (ONETOUCH ULTRASOFT) lancets, Used to check blood sugars twice daily., Disp: 100 each, Rfl: 12 .  metoprolol succinate (TOPROL-XL) 100 MG 24 hr tablet, Take 1 tablet (100 mg total) by mouth daily. Take with or immediately following a meal., Disp: 90 tablet, Rfl: 1 .  quinapril (ACCUPRIL) 40 MG tablet, Take 1 tablet (40 mg total) by mouth at bedtime., Disp: 90  tablet, Rfl: 1  EXAM:  VITALS per patient if applicable: BP Readings from Last 3 Encounters:  02/27/19 (!) 160/82      ASSESSMENT AND PLAN:  Discussed the following assessment and plan:  Problem List Items Addressed This Visit      Cardiovascular and Mediastinum   Essential hypertension    Advised to keep BP log and call with readings.  He verbalized understanding of the importance of this.       SVT (supraventricular tachycardia) (HCC)    No recurrence of  symptoms. Seeing cardiology this month. Will follow        Endocrine   Type 2 diabetes mellitus without complication, with long-term current use of insulin (HCC)    Symptomatically appears somewhat improved however patient has not been checking blood sugars at home.  Advised him to check blood sugars TID and follow-up in 2 weeks to  discuss changes to his diabetic regimen        Other   COVID-19    Symptomatically improved.  Patient will let me know of any concerns         -we discussed possible serious and likely etiologies, options for evaluation and workup, limitations of telemedicine visit vs in person visit, treatment, treatment risks and precautions. Pt prefers to treat via telemedicine empirically rather then risking or undertaking an in person visit at this moment. Patient agrees to seek prompt in person care if worsening, new symptoms arise, or if is not improving with treatment.   I discussed the assessment and treatment plan with the patient. The patient was provided an opportunity to ask questions and all were answered. The patient agreed with the plan and demonstrated an understanding of the instructions.   The patient was advised to call back or seek an in-person evaluation if the symptoms worsen or if the condition fails to improve as anticipated.   Mable Paris, FNP  I spent 15 min non face to face w/ pt.

## 2019-07-31 NOTE — Chronic Care Management (AMB) (Signed)
  Care Management   Note  07/31/2019 Name: [name] MRN: [id number] DOB: [birth year]  Dominic [name] Companion is a 49 y.o. year old male who is a primary care patient of Burnard Hawthorne, FNP. I reached out to [name] by phone today in response to a referral sent by Mr. Lindal [employer] health plan.    Mr. Sheffler was given information about care management services today including:  1. Care management services include personalized support from designated clinical staff supervised by his physician, including individualized plan of care and coordination with other care providers 2. 24/7 contact phone numbers for assistance for urgent and routine care needs. 3. The patient may stop care management services at any time by phone call to the office staff.  Patient agreed to services and verbal consent obtained.   Follow up plan: Telephone appointment with care management team member scheduled for:09/04/2019  Glenna Durand, LPN Health Advisor, [facility] Management ??[email or web address] ??[phone]

## 2019-07-31 NOTE — Chronic Care Management (AMB) (Signed)
  Care Management   Outreach Note  07/31/2019 Name: Dominic [name] MRN: [id number] DOB: [birth year]  Referred by: Burnard Hawthorne, FNP Reason for referral :  Care Management (CM 2nd Initial outreach unsuccessful)   A second unsuccessful telephone outreach [name] attempted today. The patient [name] referred to the case management team for assistance with care management and care coordination.   Follow Up Plan: A HIPPA compliant phone message [name] left for the patient providing contact information and requesting a return call.  The care management team will reach out to the patient again over the next 7 days.  If patient returns call to provider office, please advise to call Embedded Care Management Care Guide Glenna Durand LPN at [phone]  Leman Martinek, LPN Health Advisor, [place] Management ??[email or web address] ??[phone]

## 2019-08-10 NOTE — Progress Notes (Signed)
New Outpatient Visit Date: 08/11/2019  Referring Provider: Burnard Hawthorne, FNP [address] [place],  [place] [address]  Chief Complaint: Chest pain and history of SVT  HPI:  [name] is a 49 y.o. male who is being seen today for the evaluation of palpitations at the request of Ms. Arnett. He has a history of supraventricular tachycardia, hypertension, diabetes mellitus, arthritis, and COVID-19 infection 4 weeks ago.  Today, the Gai reports that he has been having chest pain for about a week.  He describes a dull ache in the center of his chest that waxes and wanes in intensity.  He also develops "burning in my lungs" with mild activity.  Discomfort sometimes radiates to the left arm.  This has been present for about the same time.  He notes worsening exertional dyspnea.  He had somewhat similar symptoms when he first developed COVID, though they had resolved after about a week.  He has also had increasing palpitations and some episodes of sustained tachycardia consistent with prior SVT.  Mr. Aughenbaugh reports he was diagnosed with SVT about 12 years ago by a primary care provider in [place], [place].  He was placed on metoprolol but never saw a cardiologist.  He has never undergone cardiac testing.  He reports having a sleep study about 2 years ago that did not show sleep apnea.  Mr. Dyce notes increasing swelling and a 20 pound weight gain over the last month.  He wonders if some of his chest pain could be due to acid reflux, prompting him to use over-the-counter omeprazole.  He is unsure if this has helped much.  He has a history of psoriatic arthritis for which he uses Humira.  However, he has not taken this for the last month on account of his COVID-19 infection.  --------------------------------------------------------------------------------------------------  Cardiovascular History & Procedures: Cardiovascular Problems:  Supraventricular tachycardia  Chest pain  Risk  Factors:  Hypertension, diabetes mellitus, male gender, and morbid obesity  Cath/PCI:  None  CV Surgery:  None  EP Procedures and Devices:  None  Non-Invasive Evaluation(s):  None  Recent CV Pertinent Labs: Lab Results  Component Value Date   CHOL 149 07/19/2019   HDL 26 (L) 07/19/2019   LDLCALC 58 07/19/2019   TRIG 424 (H) 07/19/2019   CHOLHDL 5.7 (H) 07/19/2019   K 3.5 07/19/2019   BUN 11 07/19/2019   CREATININE 0.85 07/19/2019    --------------------------------------------------------------------------------------------------  Past Medical History:  Diagnosis Date  . Arthritis   . Diabetes mellitus without complication ([facility])   . GERD (gastroesophageal reflux disease)   . Hypertension   . SVT (supraventricular tachycardia) ([facility])     History reviewed. No pertinent surgical history.  Current Meds  Medication Sig  . albuterol (VENTOLIN HFA) 108 (90 Base) MCG/ACT inhaler Inhale 2 puffs into the lungs every 6 (six) hours as needed for wheezing or shortness of breath.  Marland Kitchen amLODipine (NORVASC) 5 MG tablet Take 1 tablet (5 mg total) by mouth daily.  . benzonatate (TESSALON PERLES) 100 MG capsule Take 1 capsule (100 mg total) by mouth 3 (three) times daily as needed for cough.  Marland Kitchen glipiZIDE (GLUCOTROL) 10 MG tablet Take 1 tablet (10 mg total) by mouth 2 (two) times daily before a meal.  . glucose blood test strip 1 each by Other route as needed for other. Use to test blood sugar twice daily.  Marland Kitchen Halobetasol Prop-Tazarotene (DUOBRII) 0.01-0.045 % LOTN Apply topically.  . hydrochlorothiazide (HYDRODIURIL) 25 MG tablet Take  1 tablet (25 mg total) by mouth daily.  . Insulin Glargine (LANTUS SOLOSTAR) 100 UNIT/ML Solostar Pen Inject 50 Units into the skin daily.  . Lancets (ONETOUCH ULTRASOFT) lancets Used to check blood sugars twice daily.  Marland Kitchen [name] Take by mouth daily.  . metoprolol succinate (TOPROL-XL) 100 MG 24 hr tablet Take 1 tablet (100 mg total) by mouth  daily. Take with or immediately following a meal.  . Multiple Vitamin (MULTIVITAMIN) tablet Take 1 tablet by mouth daily.  . quinapril (ACCUPRIL) 40 MG tablet Take 1 tablet (40 mg total) by mouth at bedtime.  Marland Kitchen VITAMIN D PO Take by mouth daily.    Allergies: Patient has no known allergies.  Social History   Tobacco Use  . Smoking status: Never Smoker  . Smokeless tobacco: Never Used  Substance Use Topics  . Alcohol use: Yes    Alcohol/week: 1.0 standard drinks    Types: 1 Glasses of wine per week  . Drug use: Never    Family History  Problem Relation Age of Onset  . Arthritis Mother   . Heart disease Mother   . Supraventricular tachycardia Mother        s/p ablation  . Diabetes Father   . Heart attack Maternal Uncle   . Heart attack Maternal Uncle   . Heart attack Maternal Uncle   . Prostate cancer Neg Hx     Review of Systems: A 12-system review of systems was performed and was negative except as noted in the HPI.  --------------------------------------------------------------------------------------------------  Physical Exam: BP (!) 202/104 (BP Location: Right Arm, Patient Position: Sitting, Cuff Size: Normal)   Pulse 99   Ht 5' 8.5" (1.74 m)   Wt 296 lb 8 oz (134.5 kg)   SpO2 99%   BMI 44.43 kg/m   General: Morbidly obese man, seated comfortably in the exam room. HEENT: No conjunctival pallor or scleral icterus. Facemask in place. Neck: Supple without lymphadenopathy, thyromegaly, JVD, or HJR. No carotid bruit. Lungs: Normal work of breathing. Clear to auscultation bilaterally without wheezes or crackles. Heart: Distant heart sounds.  Regular rate and rhythm without murmurs, rubs, or gallops.  Unable to assess PMI due to body habitus. Abd: Bowel sounds present.  Obese with mild left upper quadrant tenderness.  No rebound or guarding.  Unable to assess HSM due to body habitus. Ext: 1+ pretibial edema bilaterally. Radial, PT, and DP pulses are 2+  bilaterally Skin: Warm and dry without rash. Neuro: CNIII-XII intact. Strength and fine-touch sensation intact in upper and lower extremities bilaterally. Psych: Normal mood and affect.  EKG: Normal sinus rhythm with nonspecific ST segment changes.  No previous tracing available for comparison.  Lab Results  Component Value Date   WBC 6.1 07/19/2019   HGB 17.8 (H) 07/19/2019   HCT 52.1 (H) 07/19/2019   MCV 86 07/19/2019   PLT 181 07/19/2019    Lab Results  Component Value Date   NA 135 07/19/2019   K 3.5 07/19/2019   CL 92 (L) 07/19/2019   CO2 24 07/19/2019   BUN 11 07/19/2019   CREATININE 0.85 07/19/2019   GLUCOSE 279 (H) 07/19/2019   ALT 41 07/19/2019    Lab Results  Component Value Date   CHOL 149 07/19/2019   HDL 26 (L) 07/19/2019   LDLCALC 58 07/19/2019   TRIG 424 (H) 07/19/2019   CHOLHDL 5.7 (H) 07/19/2019     --------------------------------------------------------------------------------------------------  ASSESSMENT AND PLAN: Unstable angina: Mr. Cubitt reports fluctuating chest pain over the  last week as well as burning in the chest with activity.  While the pain is somewhat atypical and could be related to recent COVID-19 infection, Mr. Vandyken has numerous cardiac risk factors including hypertension, diabetes, and morbid obesity.  EKG shows subtle nonspecific ST segment changes.  In the setting of ongoing chest pain, we have administered aspirin 324 mg x 1 in the clinic and will take him to the emergency department for further evaluation.  If he continues to have chest discomfort, particularly if troponin is elevated, he may need to undergo catheterization later today.  Otherwise, noninvasive ischemia testing during this hospitalization or in the near future as an outpatient will need to be considered.  I also think echocardiogram to exclude cardiomyopathy in the setting of recent COVID-19 infection, weight gain, and edema is indicated.  Subtle nonspecific ST  segment changes.  History of SVT: This diagnosis was reportedly made by her primary care provider over 10 years ago.  EKG today does not show arrhythmia.  It may be useful to consider ambulatory cardiac monitoring to better understand his arrhythmia and determine the need for escalation of metoprolol versus EP consultation to discuss ablation.  Hypertension: Blood pressure poorly controlled today.  I will defer medication changes pending ED evaluation.  Type 2 diabetes mellitus: Continue management per Ms. Arnett.  Morbid obesity: BMI greater than 40 with multiple comorbidities (hypertension and diabetes mellitus).  Encourage weight loss through diet and exercise.  Follow-up: To be determined based on outcome of ED visit.  Nelva Bush, MD 08/11/2019 9:12 AM

## 2019-08-11 ENCOUNTER — Other Ambulatory Visit: Payer: Self-pay | Admitting: Nurse Practitioner

## 2019-08-11 ENCOUNTER — Encounter: Payer: Self-pay | Admitting: Internal Medicine

## 2019-08-11 ENCOUNTER — Ambulatory Visit (INDEPENDENT_AMBULATORY_CARE_PROVIDER_SITE_OTHER): Payer: Managed Care, Other (non HMO) | Admitting: Internal Medicine

## 2019-08-11 ENCOUNTER — Emergency Department: Payer: Managed Care, Other (non HMO)

## 2019-08-11 ENCOUNTER — Encounter: Payer: Self-pay | Admitting: Emergency Medicine

## 2019-08-11 ENCOUNTER — Other Ambulatory Visit: Payer: Self-pay

## 2019-08-11 ENCOUNTER — Emergency Department
Admission: EM | Admit: 2019-08-11 | Discharge: 2019-08-11 | Disposition: A | Discharge status: Home / Self Care | Payer: Managed Care, Other (non HMO) | Attending: Emergency Medicine | Admitting: Emergency Medicine

## 2019-08-11 VITALS — BP 202/104 | HR 99 | Ht 68.5 in | Wt 296.5 lb

## 2019-08-11 DIAGNOSIS — Z8679 Personal history of other diseases of the circulatory system: Secondary | ICD-10-CM

## 2019-08-11 DIAGNOSIS — E119 Type 2 diabetes mellitus without complications: Secondary | ICD-10-CM

## 2019-08-11 DIAGNOSIS — R0609 Other forms of dyspnea: Secondary | ICD-10-CM

## 2019-08-11 DIAGNOSIS — I200 Unstable angina: Secondary | ICD-10-CM

## 2019-08-11 DIAGNOSIS — Z794 Long term (current) use of insulin: Secondary | ICD-10-CM | POA: Insufficient documentation

## 2019-08-11 DIAGNOSIS — R079 Chest pain, unspecified: Secondary | ICD-10-CM

## 2019-08-11 DIAGNOSIS — Z79899 Other long term (current) drug therapy: Secondary | ICD-10-CM | POA: Insufficient documentation

## 2019-08-11 DIAGNOSIS — I471 Supraventricular tachycardia: Secondary | ICD-10-CM

## 2019-08-11 DIAGNOSIS — R0789 Other chest pain: Secondary | ICD-10-CM | POA: Insufficient documentation

## 2019-08-11 DIAGNOSIS — I10 Essential (primary) hypertension: Secondary | ICD-10-CM | POA: Insufficient documentation

## 2019-08-11 HISTORY — DX: Morbid (severe) obesity due to excess calories: E66.01

## 2019-08-11 HISTORY — DX: Arthropathic psoriasis, unspecified: L40.50

## 2019-08-11 LAB — CBC WITH DIFFERENTIAL/PLATELET
Abs Immature Granulocytes: 0.03 10*3/uL (ref 0.00–0.07)
Basophils Absolute: 0 10*3/uL (ref 0.0–0.1)
Basophils Relative: 1 %
Eosinophils Absolute: 0.2 10*3/uL (ref 0.0–0.5)
Eosinophils Relative: 2 %
HCT: 45.8 % (ref 39.0–52.0)
Hemoglobin: 15.8 g/dL (ref 13.0–17.0)
Immature Granulocytes: 0 %
Lymphocytes Relative: 30 %
Lymphs Abs: 2.5 10*3/uL (ref 0.7–4.0)
MCH: 28.6 pg (ref 26.0–34.0)
MCHC: 34.5 g/dL (ref 30.0–36.0)
MCV: 82.8 fL (ref 80.0–100.0)
Monocytes Absolute: 0.6 10*3/uL (ref 0.1–1.0)
Monocytes Relative: 7 %
Neutro Abs: 5.1 10*3/uL (ref 1.7–7.7)
Neutrophils Relative %: 60 %
Platelets: 173 10*3/uL (ref 150–400)
RBC: 5.53 MIL/uL (ref 4.22–5.81)
RDW: 12.8 % (ref 11.5–15.5)
WBC: 8.5 10*3/uL (ref 4.0–10.5)
nRBC: 0 % (ref 0.0–0.2)

## 2019-08-11 LAB — BASIC METABOLIC PANEL
Anion gap: 11 (ref 5–15)
BUN: 20 mg/dL (ref 6–20)
CO2: 27 mmol/L (ref 22–32)
Calcium: 9 mg/dL (ref 8.9–10.3)
Chloride: 96 mmol/L — ABNORMAL LOW (ref 98–111)
Creatinine, Ser: 0.86 mg/dL (ref 0.61–1.24)
GFR calc Af Amer: >60 mL/min (ref >60)
GFR calc non Af Amer: >60 mL/min (ref >60)
Glucose, Bld: 281 mg/dL — ABNORMAL HIGH (ref 70–99)
Potassium: 3.8 mmol/L (ref 3.5–5.1)
Sodium: 134 mmol/L — ABNORMAL LOW (ref 135–145)

## 2019-08-11 LAB — BRAIN NATRIURETIC PEPTIDE: B Natriuretic Peptide: 53 pg/mL (ref 0.0–100.0)

## 2019-08-11 LAB — MAGNESIUM: Magnesium: 1.8 mg/dL (ref 1.7–2.4)

## 2019-08-11 LAB — TROPONIN I (HIGH SENSITIVITY)
Troponin I (High Sensitivity): 9 ng/L (ref <18)
Troponin I (High Sensitivity): 7 ng/L (ref <18)

## 2019-08-11 MED ORDER — METOPROLOL SUCCINATE ER 100 MG PO TB24: 100.0000 mg | ORAL_TABLET | Freq: Two times a day (BID) | ORAL | 6 refills | Status: DC

## 2019-08-11 MED ORDER — NITROGLYCERIN 0.4 MG SL SUBL
0.4000 mg | SUBLINGUAL_TABLET | SUBLINGUAL | Status: DC | PRN
  Administered 2019-08-11: 10:00:00 0.4 mg via SUBLINGUAL
  Filled 2019-08-11: qty 1

## 2019-08-11 MED ORDER — IOHEXOL 350 MG/ML SOLN
75.0000 mL | Freq: Once | INTRAVENOUS | Status: AC | PRN
  Administered 2019-08-11: 12:00:00 75 mL via INTRAVENOUS

## 2019-08-11 MED ORDER — ASPIRIN 81 MG PO CHEW
324.0000 mg | CHEWABLE_TABLET | Freq: Once | ORAL | Status: AC
  Administered 2019-08-11: 324 mg via ORAL

## 2019-08-11 MED ORDER — NITROGLYCERIN 0.4 MG SL SUBL: 0.4000 mg | SUBLINGUAL_TABLET | SUBLINGUAL | 3 refills | Status: DC | PRN

## 2019-08-11 NOTE — Patient Instructions (Signed)
To ED for evaluation.  Medication Instructions:  Your physician recommends that you continue on your current medications as directed. Please refer to the Current Medication list given to you today.  *If you need a refill on your cardiac medications before your next appointment, please call your pharmacy*  Lab Work: none If you have labs (blood work) drawn today and your tests are completely normal, you will receive your results only by: Marland Kitchen MyChart Message (if you have MyChart) OR . A paper copy in the mail If you have any lab test that is abnormal or we need to change your treatment, we will call you to review the results.  Testing/Procedures: none  Follow-Up: At [facility], you and your health needs are our priority.  As part of our continuing mission to provide you with exceptional heart care, we have created designated Provider Care Teams.  These Care Teams include your primary Cardiologist (physician) and Advanced Practice Providers (APPs -  Physician Assistants and Nurse Practitioners) who all work together to provide you with the care you need, when you need it.  Your next appointment:   To be determined  The format for your next appointment:   In Person  Provider:    You may see DR Harrell Gave END or one of the following Advanced Practice Providers on your designated Care Team:    Murray Hodgkins, NP  Christell Faith, PA-C  Marrianne Mood, PA-C

## 2019-08-11 NOTE — ED Provider Notes (Signed)
[facility] Emergency Department Provider Note   ____________________________________________   First MD Initiated Contact with Patient 08/11/19 [phone]     (approximate)  I have reviewed the triage vital signs and the nursing notes.   HISTORY  Chief Complaint Chest Pain    HPI Dominic [name] is a 49 y.o. male with past medical history of hypertension, diabetes, GERD, and SVT who presents to the ED complaining of chest pain.  Patient reports that he developed a dull ache in the center of his chest about 1 week ago.  It seems to radiate across both sides of his chest and is exacerbated when he goes to walk.  He also noticed some difficulty catching his breath when he is walking, which she describes as his "lungs burning".  He has noticed some mild swelling in his lower extremities, which she has never dealt with before.  He denies any fevers or cough, did test positive for Covid on December 31, but states his symptoms related to that have all since resolved.  He was seen by Dr. Saunders Revel of cardiology earlier today, who referred him to the ED for further evaluation and likely admission.  Patient was given a dose of aspirin prior to arrival, states his chest pain has nearly resolved and is rated 1 out of 10.        Past Medical History:  Diagnosis Date  . Diabetes mellitus without complication ([place])   . GERD (gastroesophageal reflux disease)   . Hypertension   . Morbid obesity ([place])   . Psoriatic arthritis ([place])   . SVT (supraventricular tachycardia) [facility])     Patient Active Problem List   Diagnosis Date Noted  . Unstable angina ([place]) 08/11/2019  . H/O supraventricular tachycardia 08/11/2019  . Morbid obesity ([place]) 08/11/2019  . COVID-19 07/18/2019  . Type 2 diabetes mellitus without complication, with long-term current use of insulin ([place]) 02/27/2019  . SVT (supraventricular tachycardia) ([place]) 02/27/2019  . Uncontrolled hypertension 02/27/2019  . Psoriatic  arthritis ([place]) 02/27/2019    History reviewed. No pertinent surgical history.  Prior to Admission medications   Medication Sig Start Date End Date Taking? Authorizing Provider  Adalimumab (HUMIRA PEN) 40 MG/0.4ML PNKT Inject into the skin.    [provider]  albuterol (VENTOLIN HFA) 108 (90 Base) MCG/ACT inhaler Inhale 2 puffs into the lungs every 6 (six) hours as needed for wheezing or shortness of breath. 07/21/19   Burnard Hawthorne, FNP  amLODipine (NORVASC) 5 MG tablet Take 1 tablet (5 mg total) by mouth daily. 07/02/19   Leone Haven, MD  benzonatate (TESSALON PERLES) 100 MG capsule Take 1 capsule (100 mg total) by mouth 3 (three) times daily as needed for cough. 07/21/19   Burnard Hawthorne, FNP  glipiZIDE (GLUCOTROL) 10 MG tablet Take 1 tablet (10 mg total) by mouth 2 (two) times daily before a meal. 07/21/19   Arnett, Yvetta Coder, FNP  glucose blood test strip 1 each by Other route as needed for other. Use to test blood sugar twice daily. 07/21/19   Burnard Hawthorne, FNP  Halobetasol Prop-Tazarotene (DUOBRII) 0.01-0.045 % LOTN Apply topically.    [provider]  hydrochlorothiazide (HYDRODIURIL) 25 MG tablet Take 1 tablet (25 mg total) by mouth daily. 07/02/19   Leone Haven, MD  Insulin Glargine (LANTUS SOLOSTAR) 100 UNIT/ML Solostar Pen Inject 50 Units into the skin daily. 07/13/19   Leone Haven, MD  Lancets [facility] ULTRASOFT) lancets Used to check blood  sugars twice daily. 07/25/19   Burnard Hawthorne, FNP  [name] Take by mouth daily.    [provider]  metoprolol succinate (TOPROL-XL) 100 MG 24 hr tablet Take 1 tablet (100 mg total) by mouth 2 (two) times daily. Take with or immediately following a meal. 08/11/19   Theora Gianotti, NP  Multiple Vitamin (MULTIVITAMIN) tablet Take 1 tablet by mouth daily.    [provider]  nitroGLYCERIN (NITROSTAT) 0.4 MG SL tablet Place 1 tablet (0.4 mg total) under the tongue  every 5 (five) minutes as needed for chest pain. 08/11/19 08/10/20  Theora Gianotti, NP  quinapril (ACCUPRIL) 40 MG tablet Take 1 tablet (40 mg total) by mouth at bedtime. 03/13/19   Jodelle Green, FNP  VITAMIN D PO Take by mouth daily.    [provider]    Allergies Patient has no known allergies.  Family History  Problem Relation Age of Onset  . Arthritis Mother   . Heart disease Mother   . Supraventricular tachycardia Mother        s/p ablation  . Diabetes Father   . Heart attack Maternal Uncle   . Heart attack Maternal Uncle   . Heart attack Maternal Uncle   . Prostate cancer Neg Hx     Social History Social History   Tobacco Use  . Smoking status: Never Smoker  . Smokeless tobacco: Never Used  Substance Use Topics  . Alcohol use: Yes    Alcohol/week: 1.0 standard drinks    Types: 1 Glasses of wine per week  . Drug use: Never    Review of Systems  Constitutional: No fever/chills Eyes: No visual changes. ENT: No sore throat. Cardiovascular: Positive for chest pain. Respiratory: Positive for shortness of breath. Gastrointestinal: No abdominal pain.  No nausea, no vomiting.  No diarrhea.  No constipation. Genitourinary: Negative for dysuria. Musculoskeletal: Negative for back pain.  Positive for leg swelling.   Skin: Negative for rash. Neurological: Negative for headaches, focal weakness or numbness.  ____________________________________________   PHYSICAL EXAM:  VITAL SIGNS: ED Triage Vitals [08/11/19 0945]  Enc Vitals Group     BP      Pulse      Resp      Temp      Temp src      SpO2      Weight 296 lb 8.3 oz (134.5 kg)     Height      Head Circumference      Peak Flow      Pain Score      Pain Loc      Pain Edu?      Excl. in [place]?     Constitutional: Alert and oriented. Eyes: Conjunctivae are normal. Head: Atraumatic. Nose: No congestion/rhinnorhea. Mouth/Throat: Mucous membranes are moist. Neck: Normal  ROM Cardiovascular: Normal rate, regular rhythm. Grossly normal heart sounds. Respiratory: Normal respiratory effort.  No retractions. Lungs CTAB. Gastrointestinal: Soft and nontender. No distention. Genitourinary: deferred Musculoskeletal: No lower extremity tenderness.  Trace pitting edema to bilateral lower extremities. Neurologic:  Normal speech and language. No gross focal neurologic deficits are appreciated. Skin:  Skin is warm, dry and intact. No rash noted. Psychiatric: Mood and affect are normal. Speech and behavior are normal.  ____________________________________________   LABS (all labs ordered are listed, but only abnormal results are displayed)  Labs Reviewed  BASIC METABOLIC PANEL - Abnormal; Notable for the following components:      Result Value  Sodium 134 (*)    Chloride 96 (*)    Glucose, Bld 281 (*)    All other components within normal limits  CBC WITH DIFFERENTIAL/PLATELET  BRAIN NATRIURETIC PEPTIDE  MAGNESIUM  TROPONIN I (HIGH SENSITIVITY)  TROPONIN I (HIGH SENSITIVITY)   ____________________________________________  EKG  ED ECG REPORT I, Blake Divine, the attending physician, personally viewed and interpreted this ECG.   Date: 08/11/2019  EKG Time: 9:48  Rate: 96  Rhythm: normal sinus rhythm  Axis: Normal  Intervals:none  ST&T Change: Minimal ST depressions inferolaterally   PROCEDURES  Procedure(s) performed (including Critical Care):  Procedures   ____________________________________________   INITIAL IMPRESSION / ASSESSMENT AND PLAN / ED COURSE       49 year old male with history of hypertension and diabetes presents to the ED for 1 week of intermittent chest pressure, worse when ambulating.  Initial EKG shows borderline ST depressions inferolaterally and patient was sent to the ED for further evaluation for ACS by cardiology.  He describes very minimal chest pain at this time rated as a 1 out of 10, was subsequently given a  dose of nitroglycerin and pain seems to have resolved.  Chest x-ray negative for acute process, initial troponin within normal limits.  He was given an aspirin load prior to arrival.  Patient now also stating that pain seems to get worse when he takes a deep breath, will check CTA to rule out PE.  Case was discussed with Dr. Rockey Situ of cardiology, who states that if work-up is unremarkable here in the ED, given patient's minimal pain, he would be appropriate for further outpatient work-up with cardiology.  They will plan on scheduling stress testing early next week.  Repeat troponin within normal limits and CTA negative for PE or other acute pathology.  Patient remains chest pain-free and I have counseled him to follow-up closely with cardiology.  Patient agrees to return to the ED for worsening chest pain or other concerning symptoms.      ____________________________________________   FINAL CLINICAL IMPRESSION(S) / ED DIAGNOSES  Final diagnoses:  Nonspecific chest pain  Dyspnea on exertion  Type 2 diabetes mellitus without complication, with long-term current use of insulin [facility])     ED Discharge Orders         Ordered    metoprolol succinate (TOPROL-XL) 100 MG 24 hr tablet  2 times daily     08/11/19 1351    nitroGLYCERIN (NITROSTAT) 0.4 MG SL tablet  Every 5 min PRN     08/11/19 1351           Note:  This document was prepared using Dragon voice recognition software and may include unintentional dictation errors.   Blake Divine, MD 08/11/19 1539

## 2019-08-11 NOTE — Consult Note (Signed)
Cardiology Consult    Patient ID: Dominic [name] MRN: [id number], DOB/AGE: Aug 09, 1970   Admit date: 08/11/2019 Date of Consult: 08/11/2019  Primary Physician: Burnard Hawthorne, FNP Primary Cardiologist: Nelva Bush, MD Requesting Provider: Colin Rhein, MD  Patient Profile    Dominic [name] is a 49 y.o. male with a history of SVT, HTN, DMII, psoriatic arthritis, obesity, and recent COVID-19 infection, who is being seen today for the evaluation of chest pain at the request of Dr. Charna Archer.  Past Medical History   Past Medical History:  Diagnosis Date  . Diabetes mellitus without complication ([place])   . GERD (gastroesophageal reflux disease)   . Hypertension   . Morbid obesity ([place])   . Psoriatic arthritis ([place])   . SVT (supraventricular tachycardia) ([place])     History reviewed. No pertinent surgical history.   Allergies  No Known Allergies  History of Present Illness    49 y/o ? with a h/o SVT, HTN, DMII, psoriatic arthritis, obesity, and recent COVID-19 infection.  He was dx w/ SVT ~ 12 yrs ago by his then PCP in [place], [place].  He was placed on metoprolol but was never seen by cardiology or underwent any cardiac testing.  He typically experiences brief episodes of palpitations/skipped beats on a daily basis and ~ 1x/month he will have a more prolonged episode, often in the setting of too much caffeine intake or missing his metoprolol, lasting several hours and on several occasions, > 1 day.  More prolonged episodes have been assoc w/ chest discomfort and dyspnea.  Palpitations typically respond to vagal maneuvers, but on at least one occasion in the past few yrs, he had to go to the ED to receive adenosine.    Over the past year, he has been less active and more sedentary.  In that setting, he has gained weight.  On 12/28, he developed fatigue, diarrhea, anosmia, and ageusia.  He was dx w/ COVID-19.  He remained @ home and out of work for 10 days.  His most notable symptoms was  fatigue, and he says that he did very little during his period of quarantine.  During COVID-19 infection, he noted more frequent bouts of palpitations and elevated heart rates in general.  He attributed this to diarrhea and dehydration, which lasted ~ 2 days.  At one point, he had a sustained HR of 159 bpm for 15 hrs.  He had considered coming to the ED, until it broke spontaneously.  During COVID-19 infection, he also noted a dull chest pain that was worse with deep breathing.  This seemed to improve following recovery, but about 1 wk ago, he noted its return.  Over the past week, he has noted very mild, exertional - 1-2/10 dull retrosternal chest aching associated with dyspnea and a sensation that his lungs are burning, occurring daily, lasting about 5-10 mins, and resolving w/ rest.  Over the past month, he has also noted a 20 lbs wt gain and increasing lower ext edema.  He notes that he has been more sedentary since his COVID-19 infection, and in the setting of ongoing anosmia and ageusia, he has been eating more salty and sugary foods.  [name] was seen by Dr. Saunders Revel earlier today and reported recurrent chest aching w/ dypnea, that started while walking into the office, but persisted during the visit.  ECG w/ nonspecific ST changes.  He was referred to the ED for evaluation and admission.  Here, labs are unremarkable.  HsTrop 9.  CTA  chest pending.  Pt is resting comfortably and says discomfort is currently a 0.5/10.  Home Medications      Prior to Admission medications   Medication Sig Start Date End Date Taking? Authorizing Provider  Adalimumab (HUMIRA PEN) 40 MG/0.4ML PNKT Inject into the skin.    [provider]  albuterol (VENTOLIN HFA) 108 (90 Base) MCG/ACT inhaler Inhale 2 puffs into the lungs every 6 (six) hours as needed for wheezing or shortness of breath. 07/21/19   Burnard Hawthorne, FNP  amLODipine (NORVASC) 5 MG tablet Take 1 tablet (5 mg total) by mouth daily. 07/02/19    Leone Haven, MD  benzonatate (TESSALON PERLES) 100 MG capsule Take 1 capsule (100 mg total) by mouth 3 (three) times daily as needed for cough. 07/21/19   Burnard Hawthorne, FNP  glipiZIDE (GLUCOTROL) 10 MG tablet Take 1 tablet (10 mg total) by mouth 2 (two) times daily before a meal. 07/21/19   Arnett, Yvetta Coder, FNP  glucose blood test strip 1 each by Other route as needed for other. Use to test blood sugar twice daily. 07/21/19   Burnard Hawthorne, FNP  Halobetasol Prop-Tazarotene (DUOBRII) 0.01-0.045 % LOTN Apply topically.    [provider]  hydrochlorothiazide (HYDRODIURIL) 25 MG tablet Take 1 tablet (25 mg total) by mouth daily. 07/02/19   Leone Haven, MD  Insulin Glargine (LANTUS SOLOSTAR) 100 UNIT/ML Solostar Pen Inject 50 Units into the skin daily. 07/13/19   Leone Haven, MD  Lancets [facility] ULTRASOFT) lancets Used to check blood sugars twice daily. 07/25/19   Burnard Hawthorne, FNP  [name] Take by mouth daily.    [provider]  metoprolol succinate (TOPROL-XL) 100 MG 24 hr tablet Take 1 tablet (100 mg total) by mouth daily. Take with or immediately following a meal. 07/02/19   Leone Haven, MD  Multiple Vitamin (MULTIVITAMIN) tablet Take 1 tablet by mouth daily.    [provider]  quinapril (ACCUPRIL) 40 MG tablet Take 1 tablet (40 mg total) by mouth at bedtime. 03/13/19   Jodelle Green, FNP  VITAMIN D PO Take by mouth daily.    [provider]      Family History    Family History  Problem Relation Age of Onset  . Arthritis Mother   . Heart disease Mother   . Supraventricular tachycardia Mother        s/p ablation  . Diabetes Father   . Heart attack Maternal Uncle   . Heart attack Maternal Uncle   . Heart attack Maternal Uncle   . Prostate cancer Neg Hx    He indicated that his mother is alive. He indicated that his father is deceased. He indicated that all of his three maternal uncles are deceased. He  indicated that the status of his neg hx is unknown.   Social History    Social History   Socioeconomic History  . Marital status: Single    Spouse name: Not on file  . Number of children: Not on file  . Years of education: Not on file  . Highest education level: Not on file  Occupational History  . Not on file  Tobacco Use  . Smoking status: Never Smoker  . Smokeless tobacco: Never Used  Substance and Sexual Activity  . Alcohol use: Yes    Alcohol/week: 1.0 standard drinks    Types: 1 Glasses of wine per week  . Drug use: Never  . Sexual activity: Not  on file  Other Topics Concern  . Not on file  Social History Narrative   Lives in [place] w/ a roommate.  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  Does not routinely exercise.   Social Determinants of Health   Financial Resource Strain:   . Difficulty of Paying Living Expenses: Not on file  Food Insecurity:   . Worried About Charity fundraiser in the Last Year: Not on file  . Ran Out of Food in the Last Year: Not on file  Transportation Needs:   . Lack of Transportation (Medical): Not on file  . Lack of Transportation (Non-Medical): Not on file  Physical Activity:   . Days of Exercise per Week: Not on file  . Minutes of Exercise per Session: Not on file  Stress:   . Feeling of Stress : Not on file  Social Connections:   . Frequency of Communication with Friends and Family: Not on file  . Frequency of Social Gatherings with Friends and Family: Not on file  . Attends Religious Services: Not on file  . Active Member of Clubs or Organizations: Not on file  . Attends Archivist Meetings: Not on file  . Marital Status: Not on file  Intimate Partner Violence:   . Fear of Current or Ex-Partner: Not on file  . Emotionally Abused: Not on file  . Physically Abused: Not on file  . Sexually Abused: Not on file     Review of Systems    General:  No chills, fever, night sweats or weight changes.  Cardiovascular:  +++ chest pain,  +++ dyspnea on exertion, +++ edema, no orthopnea, palpitations, paroxysmal nocturnal dyspnea. Dermatological: No rash, lesions/masses Respiratory: No cough, +++ dyspnea Urologic: No hematuria, dysuria Abdominal:   No nausea, vomiting, diarrhea, bright red blood per rectum, melena, or hematemesis Neurologic:  No visual changes, wkns, changes in mental status. All other systems reviewed and are otherwise negative except as noted above.  Physical Exam    Blood pressure (!) 173/84, pulse 88, resp. rate (!) 24, weight 134.5 kg, SpO2 96 %.  General: Pleasant, NAD Psych: Normal affect. Neuro: Alert and oriented X 3. Moves all extremities spontaneously. HEENT: Normal  Neck: Supple without bruits.  Obese, difficult to gauge JVP. Lungs:  Resp regular and unlabored, CTA. Heart: RRR no s3, s4, or murmurs. Abdomen: Obese, Soft, non-tender, non-distended, BS + x 4.  Extremities: No clubbing, cyanosis. 1+ bilat edema. DP/PT/Radials 2+ and equal bilaterally.  Labs    Cardiac Enzymes Recent Labs  Lab 08/11/19 1007  TROPONINIHS 9      Lab Results  Component Value Date   WBC 8.5 08/11/2019   HGB 15.8 08/11/2019   HCT 45.8 08/11/2019   MCV 82.8 08/11/2019   PLT 173 08/11/2019    Recent Labs  Lab 08/11/19 1007  NA 134*  K 3.8  CL 96*  CO2 27  BUN 20  CREATININE 0.86  CALCIUM 9.0  GLUCOSE 281*   Lab Results  Component Value Date   CHOL 149 07/19/2019   HDL 26 (L) 07/19/2019   LDLCALC 58 07/19/2019   TRIG 424 (H) 07/19/2019     Radiology Studies    CT Angio Chest PE W/Cm &/Or Wo Cm  Result Date: 08/11/2019 CLINICAL DATA:  Shortness of breath.  Recent COVID-19 positive EXAM: CT ANGIOGRAPHY CHEST WITH CONTRAST TECHNIQUE: Multidetector CT imaging of the chest was performed using the standard protocol during bolus administration of intravenous contrast. Multiplanar CT image reconstructions and  MIPs were obtained to evaluate the vascular anatomy. CONTRAST:  26mL OMNIPAQUE IOHEXOL  350 MG/ML SOLN COMPARISON:  Chest radiograph August 11, 2019 FINDINGS: Cardiovascular: There is no demonstrable pulmonary embolus. There is no thoracic aortic aneurysm or dissection. Visualized great vessels appear unremarkable. No pericardial effusion or pericardial thickening. Mediastinum/Nodes: Visualized thyroid appears normal. No thoracic adenopathy evident. No esophageal lesions appreciable. Lungs/Pleura: There is slight atelectatic change along the medial aspect of the superior segment of the right lower lobe. Lungs elsewhere are clear. No pleural effusions are evident. Upper Abdomen: There is hepatic steatosis. Spleen is incompletely visualized but appears prominent. Spleen measures 15 cm from anterior to posterior dimension. There is slight reflux of contrast into the inferior vena cava and hepatic veins. Musculoskeletal: There is degenerative change in the thoracic spine. There are no blastic or lytic bone lesions. No chest wall lesions are evident. Review of the MIP images confirms the above findings. IMPRESSION: 1. No demonstrable pulmonary embolus. No thoracic aortic aneurysm or dissection. 2. Slight atelectasis in the right lower lobe. Lungs elsewhere clear. 3.  No evident adenopathy. 4.  Hepatic steatosis. 5. Spleen appears somewhat prominent although incompletely visualized. 6. Mild reflux into the inferior vena cava and hepatic veins may indicate a degree of increase in right heart pressure. Electronically Signed   By: Lowella Grip III M.D.   On: 08/11/2019 12:34   DG Chest Portable 1 View  Result Date: 08/11/2019 CLINICAL DATA:  Chest pain arriving from heart clinic with 1 week history of chest pain, history of recent COVID diagnosis EXAM: PORTABLE CHEST 1 VIEW COMPARISON:  None FINDINGS: Cardiomediastinal contours and hilar structures are normal. Lungs are clear.  No signs of pleural effusion. Visualized skeletal structures are unremarkable. IMPRESSION: No acute cardiopulmonary disease.  Electronically Signed   By: Zetta Bills M.D.   On: 08/11/2019 10:40    ECG & Cardiac Imaging    RSR, 96, LAE, delayed R progression - personally reviewed.  Assessment & Plan    1.  Unstable Angina:  Pt presented to cardiology clinic today with a 1 wk h/o daily, exertional, dull chest aching and dyspnea, typically lasting 5-10 mins, but was more prolonged this AM.  He had first developed chest pain symptoms during recent COVID-19 infection, though symptoms resolved prior to returning in the past week.  There is a pleuritic component to his chest pain and in that setting, CTA of the chest was performed and is neg for PE.  Coronary calcifications noted.  Initial HsTrop is normal @ 9. Repeat pending.  Provided that f/u HsTrop remains normal, pt could likely be discharged from the ED and we can arrange for outpt echocardiogram and lexiscan myoview.  With resting HRs in the 90's despite oral  blocker, he would not likely be a good candidate for cardiac CTA.  If however, HsTrop returns elevated, he will need admission and probable cath on Monday.  Cont  blocker, acei.  Add asa.  2.  Essential HTN:  Poorly controlled currently.  Cont  blocker, acei, diuretic, and amlodipine.  Can titrate metoprolol to 100 bid.  Discussed increased salt intake since COVID-19 in the setting of ageusia.  Suspect that this is contributing to lower ext swelling.  3.  PSVT:  Dx ~ 12 yrs ago.  He has been on metoprolol since then but notes that he has some amt of palpitations daily - usually brief - and about 6x/yr, he has more prolonged episodes lasting hours to more than 1/2 a  day.  Cont  blocker and titrate to 100mg  bid for improved HR control.  He is open to the idea of outpatient event monitoring to assist in firmly diagnosing his SVT followed by EP referral for consideration of catheter ablation.  4.  DM II:  A1c 10.2 earlier this month.  Being managed by primary care.  5.  HTG: TG 424 on 1/6.  TC only 149 w/ LDL of  58.  He is aware that he needs to cut carbs, lose weight, and improve glycemic control.  6. DOE/Edema:  F/u echo - inpt vs outpt pending additional labs in ED.  We discussed the importance of daily weights, sodium restriction, medication compliance, and symptom reporting and he verbalizes understanding.  BNP wnl.  CXR w/o CHF.   Signed, Murray Hodgkins, NP 08/11/2019, 12:56 PM  For questions or updates, please contact   Please consult [email or web address] for contact info under Cardiology/STEMI.

## 2019-08-11 NOTE — ED Triage Notes (Signed)
Arrives from heart clinic with c/o one week history of chest pain and htn.  Patient diagnosed with COVID one month ago.  325 mg ASA given PTA by heart clinic.  AAOx3.  Skin warm and dry. No SOB/ DOE.  NAD

## 2019-08-11 NOTE — Discharge Instructions (Signed)
Please schedule follow-up within the next week with cardiology so that you may undergo stress testing.  If the pain in your chest worsens in the meantime, please return to the ER for reevaluation.  Please also return to the ER for worsening shortness of breath, fever, cough, or any other concerning symptoms.

## 2019-08-14 ENCOUNTER — Telehealth: Payer: Self-pay | Admitting: Internal Medicine

## 2019-08-14 ENCOUNTER — Ambulatory Visit (INDEPENDENT_AMBULATORY_CARE_PROVIDER_SITE_OTHER): Payer: Managed Care, Other (non HMO) | Admitting: Family

## 2019-08-14 ENCOUNTER — Encounter: Payer: Self-pay | Admitting: Family

## 2019-08-14 VITALS — BP 150/80 | HR 80 | Ht 68.5 in | Wt 298.0 lb

## 2019-08-14 DIAGNOSIS — I10 Essential (primary) hypertension: Secondary | ICD-10-CM | POA: Diagnosis not present

## 2019-08-14 DIAGNOSIS — Z794 Long term (current) use of insulin: Secondary | ICD-10-CM

## 2019-08-14 DIAGNOSIS — I200 Unstable angina: Secondary | ICD-10-CM

## 2019-08-14 DIAGNOSIS — E119 Type 2 diabetes mellitus without complications: Secondary | ICD-10-CM

## 2019-08-14 DIAGNOSIS — K760 Fatty (change of) liver, not elsewhere classified: Secondary | ICD-10-CM | POA: Diagnosis not present

## 2019-08-14 MED ORDER — JARDIANCE 10 MG PO TABS: 10.0000 mg | ORAL_TABLET | Freq: Every morning | ORAL | 1 refills | Status: DC

## 2019-08-14 NOTE — Telephone Encounter (Signed)
Called patient and he verbalized understanding of the information below.   [place]  Your caregiver has ordered a Stress Test with nuclear imaging. The purpose of this test is to evaluate the blood supply to your heart muscle. This procedure is referred to as a "Non-Invasive Stress Test." This is because other than having an IV started in your vein, nothing is inserted or "invades" your body. Cardiac stress tests are done to find areas of poor blood flow to the heart by determining the extent of coronary artery disease (CAD). Some patients exercise on a treadmill, which naturally increases the blood flow to your heart, while others who are  unable to walk on a treadmill due to physical limitations have a pharmacologic/chemical stress agent called Lexiscan . This medicine will mimic walking on a treadmill by temporarily increasing your coronary blood flow.   Please note: these test may take anywhere between 2-4 hours to complete  PLEASE REPORT TO [place] AT THE FIRST DESK WILL DIRECT YOU WHERE TO GO  Date of Procedure:____2/5/21_____________  Arrival Time for Procedure:______07:45am__________  Instructions regarding medication:   _x_ : Hold diabetes medication morning of procedure - Metformin, Glipizide  - Take 1/2 dose of Lantus insulin the night before.   ____:  Hold other medications as follows:_______HCTZ_______  PLEASE NOTIFY THE OFFICE AT LEAST 69 HOURS IN ADVANCE IF YOU ARE UNABLE TO KEEP YOUR APPOINTMENT.  [phone] AND  PLEASE NOTIFY NUCLEAR MEDICINE AT [facility] AT LEAST 24 HOURS IN ADVANCE IF YOU ARE UNABLE TO KEEP YOUR APPOINTMENT. [phone]  How to prepare for your Myoview test:  1. Do not eat or drink after midnight 2. No caffeine for 24 hours prior to test 3. No smoking 24 hours prior to test. 4. Your medication may be taken with water.  If your doctor stopped a medication because of this test, do not take that  medication. 5. Ladies, please do not wear dresses.  Skirts or pants are appropriate. Please wear a short sleeve shirt. 6. No perfume, cologne or lotion. 7. Wear comfortable walking shoes.

## 2019-08-14 NOTE — Telephone Encounter (Signed)
Schedule ed fu testing .  Patient wants to go over lexi instructions.  Please call.

## 2019-08-14 NOTE — Patient Instructions (Signed)
Nonalcoholic Fatty Liver Disease Diet Introduction Nonalcoholic fatty liver disease is a condition that causes fat to accumulate in and around the liver. The disease makes it harder for the liver to work the way that it should. Following a healthy diet can help to keep nonalcoholic fatty liver disease under control. It can also help to prevent or improve conditions that are associated with the disease, such as heart disease, diabetes, high blood pressure, and abnormal cholesterol levels. Along with regular exercise, this diet:  Promotes weight loss.  Helps to control blood sugar levels.  Helps to improve the way that the body uses insulin. What do I need to know about this diet?  Use the glycemic index (GI) to plan your meals. The index tells you how quickly a food will raise your blood sugar. Choose low-GI foods. These foods take a longer time to raise blood sugar.  Keep track of how many calories you take in. Eating the right amount of calories will help you to achieve a healthy weight.  You may want to follow a Mediterranean diet. This diet includes a lot of vegetables, lean meats or fish, whole grains, fruits, and healthy oils and fats. What foods can I eat? Grains  Whole grains, such as whole-wheat or whole-grain breads, crackers, tortillas, cereals, and pasta. Stone-ground whole wheat. Pumpernickel bread. Unsweetened oatmeal. Bulgur. Barley. Quinoa. Brown or wild rice. Corn or whole-wheat flour tortillas. Vegetables  Lettuce. Spinach. Peas. Beets. Cauliflower. Cabbage. Broccoli. Carrots. Tomatoes. Squash. Eggplant. Herbs. Peppers. Onions. Cucumbers. Brussels sprouts. Yams and sweet potatoes. Beans. Lentils. Fruits  Bananas. Apples. Oranges. Grapes. Papaya. Mango. Pomegranate. Kiwi. Grapefruit. Cherries. Meats and Other Protein Sources  Seafood and shellfish. Lean meats. Poultry. Tofu. Dairy  Low-fat or fat-free dairy products, such as yogurt, cottage cheese, and  cheese. Beverages  Water. Sugar-free drinks. Tea. Coffee. Low-fat or skim milk. Milk alternatives, such as soy or almond milk. Real fruit juice. Condiments  Mustard. Relish. Low-fat, low-sugar ketchup and barbecue sauce. Low-fat or fat-free mayonnaise. Sweets and Desserts  Sugar-free sweets. Fats and Oils  Avocado. Canola or olive oil. Nuts and nut butters. Seeds. The items listed above may not be a complete list of recommended foods or beverages. Contact your dietitian for more options.  What foods are not recommended? Palm oil and coconut oil. Processed foods. Fried foods. Sweetened drinks, such as sweet tea, milkshakes, snow cones, iced sweet drinks, and sodas. Alcohol. Sweets. Foods that contain a lot of salt or sodium. The items listed above may not be a complete list of foods and beverages to avoid. Contact your dietitian for more information.  This information is not intended to replace advice given to you by your health care provider. Make sure you discuss any questions you have with your health care provider. Document Released: 11/13/2014 Document Revised: 12/05/2015 Document Reviewed: 07/24/2014  2017 Elsevier  Fatty Liver Introduction  Fatty liver, also called hepatic steatosis or steatohepatitis, is a condition in which too much fat has built up in your liver cells. The liver removes harmful substances from your bloodstream. It produces fluids your body needs. It also helps your body use and store energy from the food you eat. In many cases, fatty liver does not cause symptoms or problems. It is often diagnosed when tests are being done for other reasons. However, over time, fatty liver can cause inflammation that may lead to more serious liver problems, such as scarring of the liver (cirrhosis). What are the causes? Causes of fatty  liver may include:  Drinking too much alcohol.  Poor nutrition.  Obesity.  Cushing  syndrome.  Diabetes.  Hyperlipidemia.  Pregnancy.  Certain drugs.  Poisons.  Some viral infections. What increases the risk? You may be more likely to develop fatty liver if you:  Abuse alcohol.  Are pregnant.  Are overweight.  Have diabetes.  Have hepatitis.  Have a high triglyceride level. What are the signs or symptoms? Fatty liver often does not cause any symptoms. In cases where symptoms develop, they can include:  Fatigue.  Weakness.  Weight loss.  Confusion.  Abdominal pain.  Yellowing of your skin and the white parts of your eyes (jaundice).  Nausea and vomiting. How is this diagnosed? Fatty liver may be diagnosed by:  Physical exam and medical history.  Blood tests.  Imaging tests, such as an ultrasound, CT scan, or MRI.  Liver biopsy. A small sample of liver tissue is removed using a needle. The sample is then looked at under a microscope. How is this treated? Fatty liver is often caused by other health conditions. Treatment for fatty liver may involve medicines and lifestyle changes to manage conditions such as:  Alcoholism.  High cholesterol.  Diabetes.  Being overweight or obese. Follow these instructions at home:  Eat a healthy diet as directed by your health care provider.  Exercise regularly. This can help you lose weight and control your cholesterol and diabetes. Talk to your health care provider about an exercise plan and which activities are best for you.  Do not drink alcohol.  Take medicines only as directed by your health care provider. Contact a health care provider if: You have difficulty controlling your:  Blood sugar.  Cholesterol.  Alcohol consumption. Get help right away if:  You have abdominal pain.  You have jaundice.  You have nausea and vomiting. This information is not intended to replace advice given to you by your health care provider. Make sure you discuss any questions you have with your health  care provider.

## 2019-08-14 NOTE — Progress Notes (Signed)
Virtual Visit via Video Note  I connected with@  on 08/15/19 at  4:00 PM EST by a video enabled telemedicine application and verified that I am speaking with the correct person using two identifiers.  Location patient: home Location provider:work  Persons participating in the virtual visit: patient, provider  I discussed the limitations of evaluation and management by telemedicine and the availability of in person appointments. The patient expressed understanding and agreed to proceed.   HPI: 'Lungs are burning' as much today and energy improved. 'Some pressure in chest.' has stress test in 5 days and echo is in 2 weeks per patient.    Has increased metoprolol and HR has improved.  BP 150/80, HR 76. Denies numbness or tingling radiating to left arm or jaw,  dizziness, frequent headaches, changes in vision, or shortness of breath.   Bilateral leg swelling has improved. When seeing Dr Saunders Revel, he felt that he 'had covid' and felt like 'veins in chest were on fire.' CP would move left and right or breast bone.   Seen by Dr. Saunders Revel 1/29 unstable angina, and sent to emergency room for ischemia work-up.  EKG showed subtle nonspecific ST segment changes.   Went to ED for ACS evaluation. CXR no acute process. CTA negative PE, hepatic steatosis shown. Trop x 2 negative  given nitro rx.   DM- FBG 180. Taking lantus - will take 50 units of lantus. However not sure about accuracy of glucometer. Planning to purchase new glucometer. No blurry vision, polyuria. Notes indiscretion with carbs.   ROS: See pertinent positives and negatives per HPI.  Past Medical History:  Diagnosis Date  . Diabetes mellitus without complication ([place])   . GERD (gastroesophageal reflux disease)   . Hypertension   . Morbid obesity ([place])   . Psoriatic arthritis ([place])   . SVT (supraventricular tachycardia) ([place])     History reviewed. No pertinent surgical history.  Family History  Problem Relation Age of Onset  . Arthritis  Mother   . Heart disease Mother   . Supraventricular tachycardia Mother        s/p ablation  . Diabetes Father   . Heart attack Maternal Uncle   . Heart attack Maternal Uncle   . Heart attack Maternal Uncle   . Prostate cancer Neg Hx     SOCIAL HX: never smoker   Current Outpatient Medications:  .  Adalimumab (HUMIRA PEN) 40 MG/0.4ML PNKT, Inject into the skin., Disp: , Rfl:  .  albuterol (VENTOLIN HFA) 108 (90 Base) MCG/ACT inhaler, Inhale 2 puffs into the lungs every 6 (six) hours as needed for wheezing or shortness of breath., Disp: 6.7 g, Rfl: 1 .  amLODipine (NORVASC) 5 MG tablet, Take 1 tablet (5 mg total) by mouth daily., Disp: 90 tablet, Rfl: 1 .  aspirin 81 MG chewable tablet, Chew by mouth daily., Disp: , Rfl:  .  Cholecalciferol (VITAMIN D-3) 125 MCG (5000 UT) TABS, Take 1 tablet by mouth daily., Disp: , Rfl:  .  glipiZIDE (GLUCOTROL) 10 MG tablet, Take 1 tablet (10 mg total) by mouth 2 (two) times daily before a meal., Disp: 120 tablet, Rfl: 2 .  glucose blood test strip, 1 each by Other route as needed for other. Use to test blood sugar twice daily., Disp: 100 each, Rfl: 3 .  Halobetasol Prop-Tazarotene (DUOBRII) 0.01-0.045 % LOTN, Apply topically., Disp: , Rfl:  .  hydrochlorothiazide (HYDRODIURIL) 25 MG tablet, Take 1 tablet (25 mg total) by mouth daily., Disp: 90 tablet,  Rfl: 1 .  Insulin Glargine (LANTUS SOLOSTAR) 100 UNIT/ML Solostar Pen, Inject 50 Units into the skin daily., Disp: 45 mL, Rfl: 0 .  Lancets (ONETOUCH ULTRASOFT) lancets, Used to check blood sugars twice daily., Disp: 100 each, Rfl: 12 .  [name], Take by mouth daily., Disp: , Rfl:  .  metoprolol succinate (TOPROL-XL) 100 MG 24 hr tablet, Take 1 tablet (100 mg total) by mouth 2 (two) times daily. Take with or immediately following a meal., Disp: 60 tablet, Rfl: 6 .  Multiple Vitamin (MULTIVITAMIN) tablet, Take 1 tablet by mouth daily., Disp: , Rfl:  .  nitroGLYCERIN (NITROSTAT) 0.4 MG SL tablet, Place  1 tablet (0.4 mg total) under the tongue every 5 (five) minutes as needed for chest pain., Disp: 25 tablet, Rfl: 3 .  quinapril (ACCUPRIL) 40 MG tablet, Take 1 tablet (40 mg total) by mouth at bedtime., Disp: 90 tablet, Rfl: 1 .  empagliflozin (JARDIANCE) 10 MG TABS tablet, Take 10 mg by mouth every morning., Disp: 90 tablet, Rfl: 1  EXAM:  VITALS per patient if applicable:  GENERAL: alert, oriented, appears well and in no acute distress  HEENT: atraumatic, conjunttiva clear, no obvious abnormalities on inspection of external nose and ears  NECK: normal movements of the head and neck  LUNGS: on inspection no signs of respiratory distress, breathing rate appears normal, no obvious gross SOB, gasping or wheezing  CV: no obvious cyanosis  MS: moves all visible extremities without noticeable abnormality  PSYCH/NEURO: pleasant and cooperative, no obvious depression or anxiety, speech and thought processing grossly intact  ASSESSMENT AND PLAN:  Discussed the following assessment and plan:  Uncontrolled hypertension  Hepatic steatosis - Plan: Ambulatory referral to Gastroenterology  Type 2 diabetes mellitus without complication, with long-term current use of insulin ([place]) - Plan: empagliflozin (JARDIANCE) 10 MG TABS tablet  Unstable angina (HCC) Problem List Items Addressed This Visit      Cardiovascular and Mediastinum   Uncontrolled hypertension - Primary     Improving however not at goal.   Dr. Saunders Revel has recently increased metoprolol from 100 daily to 100 twice daily ( 08/11/19).  Per patient, heart rate has improved. Advised that we need to give this medication one week prior to making any changes to regimen.       Relevant Medications   aspirin 81 MG chewable tablet   Unstable angina (HCC)    Continues to have 'lungs burning' and 'some pressure in chest.' He was seen in ED 08/11/19 for acute ACS.  Reviewed his ED visit with him.  Advised close vigilance regarding symptoms and any  worsening of symptoms or new symptoms, and thus return to ED; patient verbalized understanding of this.  Will await echo and stress test with Dr. Saunders Revel. Will follow.       Relevant Medications   aspirin 81 MG chewable tablet     Digestive   Hepatic steatosis    New.  Discussed diet /lifestyle management and progression of disease.  We jointly agreed based on history of uncontrolled diabetes, hepatic stenosis and GERD that GI referral is appropriate.  This has been placed today      Relevant Orders   Ambulatory referral to Gastroenterology     Endocrine   Type 2 diabetes mellitus without complication, with long-term current use of insulin (HCC)    FBG 180.  Since Covid, blood glucose does appear to be improving.  Discussed the addition of Jardiance for additional protection in regards cardiovascular disease. I  have prescribed this medication. Patient is purchasing a new glucometer and have asked him to take blood sugars 4 times a day and to bring these to follow-up so we can continue to adjust his Lantus.  He declines referral to diabetic nutritionist at this time. he will stay vigilant in regards to any hypoglycemic episodes.  Also based on his age (nearly 15) and cardiovascular risk, I think it is prudent to start a baby aspirin.  He will start this today.  Close follow-up      Relevant Medications   empagliflozin (JARDIANCE) 10 MG TABS tablet   aspirin 81 MG chewable tablet      -we discussed possible serious and likely etiologies, options for evaluation and workup, limitations of telemedicine visit vs in person visit, treatment, treatment risks and precautions. Pt prefers to treat via telemedicine empirically rather then risking or undertaking an in person visit at this moment. Patient agrees to seek prompt in person care if worsening, new symptoms arise, or if is not improving with treatment.   I discussed the assessment and treatment plan with the patient. The patient was provided an  opportunity to ask questions and all were answered. The patient agreed with the plan and demonstrated an understanding of the instructions.   The patient was advised to call back or seek an in-person evaluation if the symptoms worsen or if the condition fails to improve as anticipated.   Mable Paris, FNP

## 2019-08-15 ENCOUNTER — Encounter: Payer: Self-pay | Admitting: Family

## 2019-08-15 ENCOUNTER — Ambulatory Visit: Payer: Managed Care, Other (non HMO) | Admitting: Family

## 2019-08-15 ENCOUNTER — Encounter: Payer: Self-pay | Admitting: Gastroenterology

## 2019-08-15 NOTE — Assessment & Plan Note (Signed)
New.  Discussed diet /lifestyle management and progression of disease.  We jointly agreed based on history of uncontrolled diabetes, hepatic stenosis and GERD that GI referral is appropriate.  This has been placed today

## 2019-08-15 NOTE — Assessment & Plan Note (Addendum)
Improving however not at goal.   Dr. Saunders Revel has recently increased metoprolol from 100 daily to 100 twice daily ( 08/11/19).  Per patient, heart rate has improved. Advised that we need to give this medication one week prior to making any changes to regimen.

## 2019-08-15 NOTE — Assessment & Plan Note (Addendum)
FBG 180.  Since Covid, blood glucose does appear to be improving.  Discussed the addition of Jardiance for additional protection in regards cardiovascular disease. I have prescribed this medication. Patient is purchasing a new glucometer and have asked him to take blood sugars 4 times a day and to bring these to follow-up so we can continue to adjust his Lantus.  He declines referral to diabetic nutritionist at this time. he will stay vigilant in regards to any hypoglycemic episodes.  Also based on his age (nearly 47) and cardiovascular risk, I think it is prudent to start a baby aspirin.  He will start this today.  Close follow-up

## 2019-08-15 NOTE — Assessment & Plan Note (Addendum)
Continues to have 'lungs burning' and 'some pressure in chest.' He was seen in ED 08/11/19 for acute ACS.  Reviewed his ED visit with him.  Advised close vigilance regarding symptoms and any worsening of symptoms or new symptoms, and thus return to ED; patient verbalized understanding of this.  Will await echo and stress test with Dr. Saunders Revel. Will follow.

## 2019-08-18 ENCOUNTER — Encounter
Admission: RE | Admit: 2019-08-18 | Discharge: 2019-08-18 | Disposition: A | Discharge status: Home / Self Care | Payer: Managed Care, Other (non HMO) | Source: Ambulatory Visit | Attending: Nurse Practitioner | Admitting: Nurse Practitioner

## 2019-08-18 ENCOUNTER — Other Ambulatory Visit: Payer: Self-pay

## 2019-08-18 DIAGNOSIS — I200 Unstable angina: Secondary | ICD-10-CM | POA: Insufficient documentation

## 2019-08-18 LAB — NM MYOCAR MULTI W/SPECT W/WALL MOTION / EF
LV dias vol: 160 mL (ref 62–150)
LV sys vol: 68 mL
Peak HR: 103 {beats}/min
Percent HR: 60 %
Rest HR: 82 {beats}/min
SDS: 0
SRS: 3
SSS: 1
TID: 0.98

## 2019-08-18 MED ORDER — TECHNETIUM TC 99M TETROFOSMIN IV KIT
32.8900 | PACK | Freq: Once | INTRAVENOUS | Status: AC | PRN
  Administered 2019-08-18: 32.89 via INTRAVENOUS

## 2019-08-18 MED ORDER — TECHNETIUM TC 99M TETROFOSMIN IV KIT
10.4150 | PACK | Freq: Once | INTRAVENOUS | Status: AC | PRN
  Administered 2019-08-18: 08:00:00 10.415 via INTRAVENOUS

## 2019-08-18 MED ORDER — REGADENOSON 0.4 MG/5ML IV SOLN
0.4000 mg | Freq: Once | INTRAVENOUS | Status: AC
  Administered 2019-08-18: 0.4 mg via INTRAVENOUS

## 2019-08-21 ENCOUNTER — Telehealth: Payer: Self-pay

## 2019-08-21 NOTE — Telephone Encounter (Signed)
Call to patient to discuss stress test results. Pt verbalized understanding and was appreciative of call. He had seen results and was concerned for possible low EF. Pt has Echo scheduled for later this month. He is anxious to see results and asked to be moved up to earlier appt if possible.   I will route to scheduling to see if they have any openings at an earlier date.   Advised pt to call for any further questions or concerns.

## 2019-08-21 NOTE — Telephone Encounter (Signed)
-----   Message from Theora Gianotti, NP sent at 08/20/2019  5:48 PM EST ----- No evidence of poor blood flow to heart muscle with stress, which is great news.  His heart squeezing function is recorded as being moderately reduced, which is most likely due to artifact on the scan, however, in order to be sure, pls arrange for an echocardiogram to more formally evaluate his heart squeezing function.

## 2019-08-22 NOTE — Telephone Encounter (Signed)
Moved him to 2/10 and his fu will now be 2/15 with Gilford Rile

## 2019-08-23 ENCOUNTER — Ambulatory Visit (INDEPENDENT_AMBULATORY_CARE_PROVIDER_SITE_OTHER): Payer: Managed Care, Other (non HMO)

## 2019-08-23 ENCOUNTER — Other Ambulatory Visit: Payer: Self-pay

## 2019-08-23 DIAGNOSIS — R0609 Other forms of dyspnea: Secondary | ICD-10-CM

## 2019-08-23 DIAGNOSIS — R0600 Dyspnea, unspecified: Secondary | ICD-10-CM | POA: Diagnosis not present

## 2019-08-23 MED ORDER — PERFLUTREN LIPID MICROSPHERE
2.0000 mL | INTRAVENOUS | Status: AC | PRN
  Administered 2019-08-23: 2 mL via INTRAVENOUS

## 2019-08-24 ENCOUNTER — Telehealth: Payer: Self-pay

## 2019-08-24 NOTE — Progress Notes (Signed)
Patient scheduled for one month f/u & asked to make sure that he brings blood sugar logs.

## 2019-08-24 NOTE — Telephone Encounter (Signed)
Call to patient to review echo results. No further orders at this time. All questions were answered.   Confirmed mon appt with patient.   Advised pt to call for any further questions or concerns.

## 2019-08-24 NOTE — Telephone Encounter (Signed)
-----   Message from Rise Mu, PA-C sent at 08/23/2019  4:23 PM EST ----- Echo showed normal pump function with normal wall motion and relaxation of the heart.  There were no significant valvular abnormalities.  Mildly elevated pressure in the right side of the heart.  I will forward to Dr. Saunders Revel as an Juluis Rainier as he saw the patient on 1/29 and sent him to the ED.  It looks like he has a follow-up scheduled for next Monday and this appointment should be kept to reassess his symptoms.

## 2019-08-27 NOTE — Progress Notes (Addendum)
Office Visit    Patient Name: Dominic [name] Date of Encounter: 08/28/2019  Primary Care Provider:  Burnard Hawthorne, FNP Primary Cardiologist:  Nelva Bush, MD Electrophysiologist:  None   Chief Complaint    Dominic [name] is a 49 y.o. male with a hx of palpitations, chest pain, SVT, HTN, DM, arthritis, COVID19, obesity presents today for follow up after Lexiscan and echocardiogram.    Past Medical History    Past Medical History:  Diagnosis Date  . Diabetes mellitus without complication ([place])   . GERD (gastroesophageal reflux disease)   . Hypertension   . Morbid obesity ([place])   . Psoriatic arthritis ([place])   . SVT (supraventricular tachycardia) ([place])    History reviewed. No pertinent surgical history.  Allergies  No Known Allergies  History of Present Illness    Dominic [name] is a 49 y.o. male with a hx of palpitations, chest pain, SVT, HTN, DM, arthritis, COVID19, obesity last seen 08/11/19 by Dr. Saunders Revel.  Dx of SVT about 12 years ago by PCP. Placed on metoprolol by PCP but never saw cardiology. Sleep study approx 2019 with no sleep apnea. He was escorted to the ED due to chest pain and subtle EKG changes. Troponin negative x2. CTA negative for PE. CT did show slight atelectasis of RLL. CT showed hepatic steatosis for which was referred to GI by his PCP. Recommended for outpatient evaluation.   Lexiscan 08/18/19 was low risk with noted small mild fixed defect in apical region likely secondary to attenuation artifact and EF 40%. Echocardiogram 08/22/18 with LVEF 55-60%, no LV wall motion abnormalities, mildly elevated PASP, no significant valvular abnormalities.   Very pleasant gentleman who works for [employer].  We reviewed his [place] and echocardiogram results in detail.  All questions were answered.    Tells me his blood pressure at home was down to 140s/80s when checked with arm cuff.  However, this weekend with the weather his power was out for 48 hours so he did a lot of  eating out of high sodium foods.  Tells me he notices drastic difference in his blood pressure when he eats high sodium.  Still having chest pain that is improving. Tells me it is better when his blood pressure and blood sugar are lower.  Seems to correlate with episodes of elevated blood pressure.  Did have 1 episode of chest pain or he took 2 nitroglycerin-his blood pressure cuff is out of batteries unfortunately was not able to check blood pressure during that episode.  Endorses that he has arthritis in his neck and spine and he feels some of his chest pain may be coming from this.  Endorses intermittent lower extremity edema at the end of the day.  Tells me it is better when his blood pressure is well controlled needs low-sodium.  Reports his palpitations are doing very well.  Notices a difference with the increase metoprolol dose.  Reports no breakthrough episodes of rapid heart rates.  Tells me he will have palpitations that feel like a "jump "every once in a while with caffeine.  We discussed the possibility of ZIO monitor and he politely declines.  He has been doing a lot to watch his diet and encouraged him to continue.  We discussed how to add exercise back into his routine and he tells me he plans to start using a treadmill and doing some weightlifting.  EKGs/Labs/Other Studies Reviewed:   The following studies were reviewed today:  Lexiscan 08/18/19 Pharmacological myocardial perfusion  imaging study with no significant  Ischemia Small mild fixed defect apical region likely secondary to attenuation artifact Normal wall motion, EF estimated at 40% (depressed ejection fraction likely secondary to GI uptake artifact) No EKG changes concerning for ischemia at peak stress or in recovery. Low risk scan  Echo 08/23/19  1. Left ventricular ejection fraction, by estimation, is 55 to 60%. The  left ventricle has normal function. The left ventrical has no regional  wall motion abnormalities.  Left ventricular diastolic parameters were  normal.   2. Right ventricular systolic function is normal. The right ventricular  size is normal. There is mildly elevated pulmonary artery systolic  pressure.   3. No left atrial/left atrial appendage thrombus was detected.   4. The mitral valve is normal in structure and function. no evidence of  mitral valve regurgitation.   5. The aortic valve is tricuspid. Aortic valve regurgitation is not  visualized.   6. The inferior vena cava is normal in size with greater than 50%  respiratory variability, suggesting right atrial pressure of 3 mmHg.   EKG: No EKG today.  Recent Labs: 07/19/2019: ALT 41; TSH 0.861 08/11/2019: B Natriuretic Peptide 53.0; BUN 20; Creatinine, Ser 0.86; Hemoglobin 15.8; Magnesium 1.8; Platelets 173; Potassium 3.8; Sodium 134  Recent Lipid Panel    Component Value Date/Time   CHOL 149 07/19/2019 1016   TRIG 424 (H) 07/19/2019 1016   HDL 26 (L) 07/19/2019 1016   CHOLHDL 5.7 (H) 07/19/2019 1016   LDLCALC 58 07/19/2019 1016   Home Medications   Current Meds  Medication Sig  . Adalimumab (HUMIRA PEN) 40 MG/0.4ML PNKT Inject into the skin.  Marland Kitchen albuterol (VENTOLIN HFA) 108 (90 Base) MCG/ACT inhaler Inhale 2 puffs into the lungs every 6 (six) hours as needed for wheezing or shortness of breath.  Marland Kitchen aspirin 81 MG chewable tablet Chew by mouth daily.  . Cholecalciferol (VITAMIN D-3) 125 MCG (5000 UT) TABS Take 1 tablet by mouth daily.  . empagliflozin (JARDIANCE) 10 MG TABS tablet Take 10 mg by mouth every morning.  Marland Kitchen glipiZIDE (GLUCOTROL) 10 MG tablet Take 1 tablet (10 mg total) by mouth 2 (two) times daily before a meal.  . glucose blood test strip 1 each by Other route as needed for other. Use to test blood sugar twice daily.  Marland Kitchen Halobetasol Prop-Tazarotene (DUOBRII) 0.01-0.045 % LOTN Apply topically.  . hydrochlorothiazide (HYDRODIURIL) 25 MG tablet Take 1 tablet (25 mg total) by mouth daily.  . Insulin Glargine (LANTUS  SOLOSTAR) 100 UNIT/ML Solostar Pen Inject 50 Units into the skin daily.  . Lancets (ONETOUCH ULTRASOFT) lancets Used to check blood sugars twice daily.  Marland Kitchen [name] Take by mouth daily.  . metoprolol succinate (TOPROL-XL) 100 MG 24 hr tablet Take 1 tablet (100 mg total) by mouth 2 (two) times daily. Take with or immediately following a meal.  . Multiple Vitamin (MULTIVITAMIN) tablet Take 1 tablet by mouth daily.  . nitroGLYCERIN (NITROSTAT) 0.4 MG SL tablet Place 1 tablet (0.4 mg total) under the tongue every 5 (five) minutes as needed for chest pain.  Marland Kitchen quinapril (ACCUPRIL) 40 MG tablet Take 1 tablet (40 mg total) by mouth at bedtime.  . [DISCONTINUED] amLODipine (NORVASC) 5 MG tablet Take 1 tablet (5 mg total) by mouth daily.    Review of Systems   Review of Systems  Constitution: Negative for chills, fever and malaise/fatigue.  Cardiovascular: Positive for chest pain, leg swelling and palpitations. Negative for dyspnea on exertion, near-syncope, orthopnea  and syncope.  Respiratory: Negative for cough, shortness of breath and wheezing.   Musculoskeletal: Positive for neck pain.  Gastrointestinal: Negative for nausea and vomiting.  Neurological: Negative for dizziness, light-headedness and weakness.   All other systems reviewed and are otherwise negative except as noted above.  Physical Exam    VS:  BP (!) 168/86 (BP Location: Left Arm, Patient Position: Sitting, Cuff Size: Large)   Pulse 74   Ht 5' 8.5" (1.74 m)   Wt 293 lb 8 oz (133.1 kg)   SpO2 98%   BMI 43.98 kg/m  , BMI Body mass index is 43.98 kg/m. GEN: Well nourished, overweight, well developed, in no acute distress. HEENT: normal. Neck: Supple, no JVD, carotid bruits, or masses. Cardiac: RRR, no murmurs, rubs, or gallops. No clubbing, cyanosis, edema.  Radials/DP/PT 2+ and equal bilaterally.  Respiratory:  Respirations regular and unlabored, clear to auscultation bilaterally. GI: Soft, nontender, nondistended, BS + x  4. MS: No deformity or atrophy. Skin: Warm and dry, no rash. Neuro:  Strength and sensation are intact. Psych: Normal affect.  Assessment & Plan    1. Uncontrolled HTN - Recent BP at home 140s/80s with arm cuff.  Discussed BP goal of less than 130/80. Echo 08/2019 LVEF 55-60%, normal LV diastolic parameters, borderline LVH, mildly elevated PASP, no significant valvular abnormalities.  We discussed the borderline LVH is likely due to his hypertension.  Continue low-sodium diet. Recommend regular cardiovascular exercise. The American Heart Association recommends 150 minutes of moderate intensity exercise weekly. Gradually increase up to  30 minutes of moderate intensity exercise 4-5 times per week.  Increase Amlodipine to 10 mg daily.  If BP remains above goal consider transition from metoprolol to Coreg or increased dose of Quinapril.  2. Chest pain in adult - Lexiscan 08/2119 low risk study.  Echo 08/2019 normal LVEF, no significant valvular abnormalities.  Reports intermittent episodes of chest pain which he associates with high blood pressure.  Anticipate his chest pain is multifactorial arthritis, uncontrolled hypertension, GERD.  No indication for further ischemic evaluation at this time.  3. DM2 - 07/19/19 A1c 10.2. Has been improving diet. Reports blood sugars at home have improved.  Continue to follow with PCP.  Appreciate inclusion of SGLT2 I for cardioprotective benefit.  4. Obesity - Weight loss via healthy diet and regular exercise encouraged.  Tells me he plans to start walking on the treadmill and lifting weights.  5. HLD - Lipid panel 07/19/19 total 149, HDL 26, LDL 58, triglycerides 424. A1c at this time 10.2 and medication as well as diet. Recommend recheck of lipid panel with recheck of A1c. In setting of obesity, DM2 would likely benefit from statin. However, if triglycerides remain severely elevated would consider Vascepa instead.  6. LE edema - Reports intermittent lower  extremity edema at the end of the day.  None present on exam today.  Recent echo with no evidence of heart failure.  Likely etiology venous insufficiency and dependent position.  Encourage low-sodium diet, elevating lower extremities when sitting. Low suspicion increasing Amlodipine dose will cause swelling, but will monitor carefully.  7. SVT/Palpitations - SVT diagnosed by previous PCP. Reports improvement since increased dose of Metoprolol. No breakthrough rapid heart rates. Occasional palpitation with caffeine. Likely etiology PVC. Discussed possible ZIO monitor and he politely declined. Continue Metoprolol 100mg  BID.  8. Hepatic steatosis -incidental finding on CT angio.  He has been referred to GI by his primary care provider has appointment upcoming.  Disposition: Send report  of blood pressures via MyChart in 2 weeks.  Follow up in 2 month(s) with Dr. Saunders Revel or APP.  Loel Dubonnet, NP 08/28/2019, 10:35 AM

## 2019-08-28 ENCOUNTER — Other Ambulatory Visit: Payer: Self-pay

## 2019-08-28 ENCOUNTER — Encounter: Payer: Self-pay | Admitting: Family

## 2019-08-28 ENCOUNTER — Ambulatory Visit (INDEPENDENT_AMBULATORY_CARE_PROVIDER_SITE_OTHER): Payer: Managed Care, Other (non HMO) | Admitting: Family

## 2019-08-28 VITALS — BP 168/86 | HR 74 | Ht 68.5 in | Wt 293.5 lb

## 2019-08-28 DIAGNOSIS — R600 Localized edema: Secondary | ICD-10-CM

## 2019-08-28 DIAGNOSIS — I10 Essential (primary) hypertension: Secondary | ICD-10-CM | POA: Diagnosis not present

## 2019-08-28 DIAGNOSIS — R079 Chest pain, unspecified: Secondary | ICD-10-CM

## 2019-08-28 DIAGNOSIS — Z794 Long term (current) use of insulin: Secondary | ICD-10-CM

## 2019-08-28 DIAGNOSIS — K760 Fatty (change of) liver, not elsewhere classified: Secondary | ICD-10-CM

## 2019-08-28 DIAGNOSIS — R002 Palpitations: Secondary | ICD-10-CM | POA: Diagnosis not present

## 2019-08-28 DIAGNOSIS — E119 Type 2 diabetes mellitus without complications: Secondary | ICD-10-CM

## 2019-08-28 MED ORDER — AMLODIPINE BESYLATE 10 MG PO TABS: 10.0000 mg | ORAL_TABLET | Freq: Every day | ORAL | 2 refills | Status: DC

## 2019-08-28 MED ORDER — AMLODIPINE BESYLATE 10 MG PO TABS: 10.0000 mg | ORAL_TABLET | Freq: Every day | ORAL | 0 refills | Status: DC

## 2019-08-28 NOTE — Patient Instructions (Addendum)
Medication Instructions:  Your physician has recommended you make the following change in your medication:   CHANGE Amlodipine to 10mg  daily  You may use up your 5mg  by taking 2 tablets   *If you need a refill on your cardiac medications before your next appointment, please call your pharmacy*  Lab Work: No lab work today.   Testing/Procedures: None ordered today.  Your stress test and echocardiogram both looked good!  Follow-Up: At [facility], you and your health needs are our priority.  As part of our continuing mission to provide you with exceptional heart care, we have created designated Provider Care Teams.  These Care Teams include your primary Cardiologist (physician) and Advanced Practice Providers (APPs -  Physician Assistants and Nurse Practitioners) who all work together to provide you with the care you need, when you need it.  Your next appointment:   2 month(s)  The format for your next appointment:   In Person  Provider:    You may see Nelva Bush, MD or one of the following Advanced Practice Providers on your designated Care Team:    Murray Hodgkins, NP  Christell Faith, PA-C  Marrianne Mood, PA-C   Other Instructions   Send a report of your blood pressures via MyChart in 2 weeks. Our goal is for your blood pressure is less than 130/80.    If you are having more palpitations, let us know.   Medication and palpitation 1. Avoid all over-the-counter antihistamines except Claritin/Loratadine and Zyrtec/Cetrizine. 2. Avoid all combination including cold sinus allergies flu decongestant and sleep medications 3. You can use Robitussin DM Mucinex and Mucinex DM for cough. 4. can use Tylenol aspirin ibuprofen and naproxen but no combinations such as sleep or sinus.   DASH Eating Plan DASH stands for "Dietary Approaches to Stop Hypertension." The DASH eating plan is a healthy eating plan that has been shown to reduce high blood pressure (hypertension).  It may also reduce your risk for type 2 diabetes, heart disease, and stroke. The DASH eating plan may also help with weight loss. What are tips for following this plan?  General guidelines  Avoid eating more than 2,300 mg (milligrams) of salt (sodium) a day. If you have hypertension, you may need to reduce your sodium intake to 1,500 mg a day.  Limit alcohol intake to no more than 1 drink a day for nonpregnant women and 2 drinks a day for men. One drink equals 12 oz of beer, 5 oz of wine, or 1 oz of hard liquor.  Work with your health care provider to maintain a healthy body weight or to lose weight. Ask what an ideal weight is for you.  Get at least 30 minutes of exercise that causes your heart to beat faster (aerobic exercise) most days of the week. Activities may include walking, swimming, or biking.  Work with your health care provider or diet and nutrition specialist (dietitian) to adjust your eating plan to your individual calorie needs. Reading food labels   Check food labels for the amount of sodium per serving. Choose foods with less than 5 percent of the Daily Value of sodium. Generally, foods with less than 300 mg of sodium per serving fit into this eating plan.  To find whole grains, look for the word "whole" as the first word in the ingredient list. Shopping  Buy products labeled as "low-sodium" or "no salt added."  Buy fresh foods. Avoid canned foods and premade or frozen meals. Cooking  Avoid adding  salt when cooking. Use salt-free seasonings or herbs instead of table salt or sea salt. Check with your health care provider or pharmacist before using salt substitutes.  Do not fry foods. Cook foods using healthy methods such as baking, boiling, grilling, and broiling instead.  Cook with heart-healthy oils, such as olive, canola, soybean, or sunflower oil. Meal planning  Eat a balanced diet that includes: ? 5 or more servings of fruits and vegetables each day. At each  meal, try to fill half of your plate with fruits and vegetables. ? Up to 6-8 servings of whole grains each day. ? Less than 6 oz of lean meat, poultry, or fish each day. A 3-oz serving of meat is about the same size as a deck of cards. One egg equals 1 oz. ? 2 servings of low-fat dairy each day. ? A serving of nuts, seeds, or beans 5 times each week. ? Heart-healthy fats. Healthy fats called Omega-3 fatty acids are found in foods such as flaxseeds and coldwater fish, like sardines, salmon, and mackerel.  Limit how much you eat of the following: ? Canned or prepackaged foods. ? Food that is high in trans fat, such as fried foods. ? Food that is high in saturated fat, such as fatty meat. ? Sweets, desserts, sugary drinks, and other foods with added sugar. ? Full-fat dairy products.  Do not salt foods before eating.  Try to eat at least 2 vegetarian meals each week.  Eat more home-cooked food and less restaurant, buffet, and fast food.  When eating at a restaurant, ask that your food be prepared with less salt or no salt, if possible. What foods are recommended? The items listed may not be a complete list. Talk with your dietitian about what dietary choices are best for you. Grains Whole-grain or whole-wheat bread. Whole-grain or whole-wheat pasta. Brown rice. [place]. Bulgur. Whole-grain and low-sodium cereals. Pita bread. Low-fat, low-sodium crackers. Whole-wheat flour tortillas. Vegetables Fresh or frozen vegetables (raw, steamed, roasted, or grilled). Low-sodium or reduced-sodium tomato and vegetable juice. Low-sodium or reduced-sodium tomato sauce and tomato paste. Low-sodium or reduced-sodium canned vegetables. Fruits All fresh, dried, or frozen fruit. Canned fruit in natural juice (without added sugar). Meat and other protein foods Skinless chicken or [place]. Ground chicken or [place]. Pork with fat trimmed off. Fish and seafood. Egg whites. Dried beans, peas, or lentils.  Unsalted nuts, nut butters, and seeds. Unsalted canned beans. Lean cuts of beef with fat trimmed off. Low-sodium, lean deli meat. Dairy Low-fat (1%) or fat-free (skim) milk. Fat-free, low-fat, or reduced-fat cheeses. Nonfat, low-sodium ricotta or cottage cheese. Low-fat or nonfat yogurt. Low-fat, low-sodium cheese. Fats and oils Soft margarine without trans fats. Vegetable oil. Low-fat, reduced-fat, or light mayonnaise and salad dressings (reduced-sodium). Canola, safflower, olive, soybean, and sunflower oils. Avocado. Seasoning and other foods Herbs. Spices. Seasoning mixes without salt. Unsalted popcorn and pretzels. Fat-free sweets. What foods are not recommended? The items listed may not be a complete list. Talk with your dietitian about what dietary choices are best for you. Grains Baked goods made with fat, such as croissants, muffins, or some breads. Dry pasta or rice meal packs. Vegetables Creamed or fried vegetables. Vegetables in a cheese sauce. Regular canned vegetables (not low-sodium or reduced-sodium). Regular canned tomato sauce and paste (not low-sodium or reduced-sodium). Regular tomato and vegetable juice (not low-sodium or reduced-sodium). Angie Fava. Olives. Fruits Canned fruit in a light or heavy syrup. Fried fruit. Fruit in cream or butter sauce. Meat and other  protein foods Fatty cuts of meat. Ribs. Fried meat. Berniece Salines. Sausage. Bologna and other processed lunch meats. Salami. Fatback. Hotdogs. Bratwurst. Salted nuts and seeds. Canned beans with added salt. Canned or smoked fish. Whole eggs or egg yolks. Chicken or [place] with skin. Dairy Whole or 2% milk, cream, and half-and-half. Whole or full-fat cream cheese. Whole-fat or sweetened yogurt. Full-fat cheese. Nondairy creamers. Whipped toppings. Processed cheese and cheese spreads. Fats and oils Butter. Stick margarine. Lard. Shortening. Ghee. Bacon fat. Tropical oils, such as coconut, palm kernel, or palm oil. Seasoning and  other foods Salted popcorn and pretzels. Onion salt, garlic salt, seasoned salt, table salt, and sea salt. Worcestershire sauce. Tartar sauce. Barbecue sauce. Teriyaki sauce. Soy sauce, including reduced-sodium. Steak sauce. Canned and packaged gravies. Fish sauce. Oyster sauce. Cocktail sauce. Horseradish that you find on the shelf. Ketchup. Mustard. Meat flavorings and tenderizers. Bouillon cubes. Hot sauce and Tabasco sauce. Premade or packaged marinades. Premade or packaged taco seasonings. Relishes. Regular salad dressings. Where to find more information:  National Heart, Lung, and [place]: https://wilson-eaton.com/  American Heart Association: www.heart.org Summary  The DASH eating plan is a healthy eating plan that has been shown to reduce high blood pressure (hypertension). It may also reduce your risk for type 2 diabetes, heart disease, and stroke.  With the DASH eating plan, you should limit salt (sodium) intake to 2,300 mg a day. If you have hypertension, you may need to reduce your sodium intake to 1,500 mg a day.  When on the DASH eating plan, aim to eat more fresh fruits and vegetables, whole grains, lean proteins, low-fat dairy, and heart-healthy fats.  Work with your health care provider or diet and nutrition specialist (dietitian) to adjust your eating plan to your individual calorie needs. This information is not intended to replace advice given to you by your health care provider. Make sure you discuss any questions you have with your health care provider. Document Revised: 06/11/2017 Document Reviewed: 06/22/2016 Elsevier Patient Education  2020 [employer].

## 2019-09-01 ENCOUNTER — Other Ambulatory Visit: Payer: Managed Care, Other (non HMO)

## 2019-09-04 ENCOUNTER — Ambulatory Visit: Payer: Managed Care, Other (non HMO) | Admitting: Pharmacist

## 2019-09-04 DIAGNOSIS — I10 Essential (primary) hypertension: Secondary | ICD-10-CM

## 2019-09-04 DIAGNOSIS — E119 Type 2 diabetes mellitus without complications: Secondary | ICD-10-CM

## 2019-09-04 DIAGNOSIS — Z794 Long term (current) use of insulin: Secondary | ICD-10-CM

## 2019-09-04 NOTE — Progress Notes (Signed)
Agree with plan and to maximize jardiance. He has follow up with me in March.   Mable Paris, NP

## 2019-09-04 NOTE — Patient Instructions (Signed)
Visit Information  Goals Addressed            This Visit's Progress     Patient Stated   . "I want to work on my blood sugars" (pt-stated)       Dominic [name] (see longtitudinal plan of care for additional care plan information)  Current Barriers:  . Diabetes: uncontrolled; complicated by chronic medical conditions including HTN, SVT, overweight, most recent A1c 10.2% o Notes that he has not started Jardiance yet after reading side effects about gangrene . Most recent eGFR: ~102 . Current antihyperglycemic regimen: Jardiance 10 mg (not taking), glipizide 10 mg BID, Lantus 50 units daily  o Metformin XR - GI problems (diarrhea) . Denies hypoglycemic episodes of symptoms . Reports hyperglycemic symptoms, including polyuria, nocturia x3 last night . Current meal patterns: o Power outage o Uses a calorie tracker app  o Breakfast: 2 eggs; coffee + splenda o Lunch: Salad; greek salad/gyro w/o pita, olives o Supper: Retail buyer; likes a cabbage meal o Drinks: diet drinks, notes he has been trying to drink more water; half and half tea sometimes  . Current exercise: notes he wants to go back to the gym, takes the stairs more;  . Current blood glucose readings:  o Fasting: 160, 140, 199 o Not checking 2 hour post prandial regularly . Cardiovascular risk reduction: o Current hypertensive regimen: Quinapril 40 mg QAM metoprolol succinate 100 mg BID, HCTZ 25 mg QAM, amlodipine 10 mg QAM (just increased by Terie Purser, NP) o Current hyperlipidemia regimen: none right now; LDL 58, though TG 424, likely related to elevated A1c o Current antiplatelet regimen: ASA 81 mg daily  Pharmacist Clinical Goal(s):  Marland Kitchen Over the next 90 days, patient will work with PharmD and primary care provider to address   Interventions: . Comprehensive medication review performed, medication list updated in electronic medical record . Reviewed goal A1c, goal fasting glucose, goal 2 hour post prandial glucose.  Patient will start checking sugars BID to provide to PCP at next visit . Recommend d/c of glipizide, as patient notes he has been of for >15 years. 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 . Discussed addition of GLP1 to target weight loss, greater glycemic benefit vs starting Jardiance prescription. Patient elects to start Jardiance first. Counseled to take QAM and stay well hydrated.  . Continue Lantus 50 units QAM.  . At f/u with PCP, recommend titration of SGLT2 vs titration of insulin vs initiation of GLP1. Goal to reduce insulin burden to reduce risk of hypoglycemia and weight gain. Discussed positive benefits of weight loss w/ DM, HTN, and GERD  Patient Self Care Activities:  . Patient will check blood glucose BID, document, and provide at future appointments . Patient will take medications as prescribed . Patient will contact provider with any episodes of hypoglycemia . Patient will report any questions or concerns to provider   Initial goal documentation        Patient verbalizes understanding of instructions provided today.   Plan: - Scheduled f/u call 10/23/19 (~4 weeks after pt visit w/ PCP)  Catie Darnelle Maffucci, PharmD, BCACP, CPP Clinical Pharmacist [place] [phone]

## 2019-09-04 NOTE — Chronic Care Management (AMB) (Signed)
**Note Dominic-Identified via Obfuscation** Chronic Care Management   Note  09/04/2019 Name: Dominic [name] MRN: [id number] DOB: 1971/01/09   Subjective:  Dominic [name] is a 49 y.o. year old male who is a primary care patient of Dominic Hawthorne, FNP. The CCM team was consulted for assistance with chronic disease management and care coordination needs.    Contacted patient for medication management review as scheduled.   Review of patient status, including review of consultants reports, laboratory and other test data, was performed as part of comprehensive evaluation and provision of chronic care management services.   SDOH (Social Determinants of Health) assessments and interventions performed:  no  Objective:  Lab Results  Component Value Date   CREATININE 0.86 08/11/2019   CREATININE 0.85 07/19/2019   CREATININE 0.81 03/03/2019    Lab Results  Component Value Date   HGBA1C 10.2 (H) 07/19/2019       Component Value Date/Time   CHOL 149 07/19/2019 1016   TRIG 424 (H) 07/19/2019 1016   HDL 26 (L) 07/19/2019 1016   CHOLHDL 5.7 (H) 07/19/2019 1016   LDLCALC 58 07/19/2019 1016    Clinical ASCVD: No  The 10-year ASCVD risk score Dominic [name] DC Jr., et al., 2013) is: 14.5%   Values used to calculate the score:     Age: 20 years     Sex: Male     Is Non-Hispanic African American: No     Diabetic: Yes     Tobacco smoker: No     Systolic Blood Pressure: 253 mmHg     Is BP treated: Yes     HDL Cholesterol: 26 mg/dL     Total Cholesterol: 149 mg/dL    BP Readings from Last 3 Encounters:  08/28/19 (!) 168/86  08/14/19 (!) 150/80  08/11/19 (!) 156/81    No Known Allergies  Medications Reviewed Today    Reviewed by Dominic [name], [place] (Pharmacist) on 09/04/19 at Dominic [name] List Status: <None>  Medication Order Taking? Sig Documenting Provider Last Dose Status Informant  Adalimumab (HUMIRA PEN) 40 MG/0.4ML PNKT [id number] Yes Inject into the skin. [provider] Taking Active   albuterol (VENTOLIN HFA)  108 (90 Base) MCG/ACT inhaler [id number] No Inhale 2 puffs into the lungs every 6 (six) hours as needed for wheezing or shortness of breath.  Patient not taking: Reported on 09/04/2019   Dominic Hawthorne, FNP Not Taking Active   amLODipine (NORVASC) 10 MG tablet [id number] Yes Take 1 tablet (10 mg total) by mouth daily. Dominic Dubonnet, NP Taking Active   aspirin 81 MG chewable tablet [id number] No Chew by mouth daily. [provider] Not Taking Active   Cholecalciferol (VITAMIN D-3) 125 MCG (5000 UT) TABS [id number] Yes Take 1 tablet by mouth daily. [provider] Taking Active   empagliflozin (JARDIANCE) 10 MG TABS tablet [id number] No Take 10 mg by mouth every morning.  Patient not taking: Reported on 09/04/2019   Dominic Hawthorne, FNP Not Taking Active   glipiZIDE (GLUCOTROL) 10 MG tablet [id number] Yes Take 1 tablet (10 mg total) by mouth 2 (two) times daily before a meal. Dominic Hawthorne, FNP Taking Active   glucose blood test strip [id number] Yes 1 each by Other route as needed for other. Use to test blood sugar twice daily. Dominic Hawthorne, FNP Taking Active         Discontinued 09/04/19 [phone] (Completed Course)   hydrochlorothiazide (HYDRODIURIL) 25 MG tablet [id number] Yes Take 1 tablet (25 mg total) by mouth  daily. Dominic Haven, MD Taking Active   Insulin Glargine (LANTUS SOLOSTAR) 100 UNIT/ML Solostar Pen [id number] Yes Inject 50 Units into the skin daily. Dominic Haven, MD Taking Active   Lancets (Dominic [name]) lancets [id number] Yes Used to check blood sugars twice daily. Dominic Hawthorne, FNP Taking Active   [name] [id number] Yes Take 5 mg by mouth every evening.  [provider] Taking Active   metoprolol succinate (TOPROL-XL) 100 MG 24 hr tablet [id number] Yes Take 1 tablet (100 mg total) by mouth 2 (two) times daily. Take with or immediately following a meal. Dominic Gianotti, NP Taking Active   Multiple Vitamin  (MULTIVITAMIN) tablet [id number] No Take 1 tablet by mouth daily. [provider] Not Taking Active   nitroGLYCERIN (NITROSTAT) 0.4 MG SL tablet [id number] No Place 1 tablet (0.4 mg total) under the tongue every 5 (five) minutes as needed for chest pain.  Patient not taking: Reported on 09/04/2019   Dominic Gianotti, NP Not Taking Active   omeprazole (PRILOSEC) 20 MG capsule [id number] Yes Take 20 mg by mouth daily. [provider] Taking Active   quinapril (ACCUPRIL) 40 MG tablet [id number] Yes Take 1 tablet (40 mg total) by mouth at bedtime. Dominic Green, FNP Taking Active            Assessment:   Goals Addressed            This Visit's Progress     Patient Stated   . "I want to work on my blood sugars" (pt-stated)       Dominic [name] (see longtitudinal plan of care for additional care plan information)  Current Barriers:  . Diabetes: uncontrolled; complicated by chronic medical conditions including HTN, SVT, overweight, most recent A1c 10.2% o Notes that he has not started Jardiance yet after reading side effects about gangrene . Most recent eGFR: ~102 . Current antihyperglycemic regimen: Jardiance 10 mg (not taking), glipizide 10 mg BID, Lantus 50 units daily  o Metformin XR - GI problems (diarrhea) . Denies hypoglycemic episodes of symptoms . Reports hyperglycemic symptoms, including polyuria, nocturia x3 last night . Current meal patterns: o Power outage o Uses a calorie tracker app  o Breakfast: 2 eggs; coffee + splenda o Lunch: Salad; greek salad/gyro w/o pita, olives o Supper: Retail buyer; likes a cabbage meal o Drinks: diet drinks, notes he has been trying to drink more water; half and half tea sometimes  . Current exercise: notes he wants to go back to the gym, takes the stairs more;  . Current blood glucose readings:  o Fasting: 160, 140, 199 o Not checking 2 hour post prandial regularly . Cardiovascular risk reduction: o Current  hypertensive regimen: Quinapril 40 mg QAM metoprolol succinate 100 mg BID, HCTZ 25 mg QAM, amlodipine 10 mg QAM (just increased by Dominic Purser, NP) o Current hyperlipidemia regimen: none right now; LDL 58, though TG 424, likely related to elevated A1c o Current antiplatelet regimen: ASA 81 mg daily  Pharmacist Clinical Goal(s):  Dominic Kitchen Over the next 90 days, patient will work with PharmD and primary care provider to address   Interventions: . Comprehensive medication review performed, medication list updated in electronic medical record . Reviewed goal A1c, goal fasting glucose, goal 2 hour post prandial glucose. Patient will start checking sugars BID to provide to PCP at next visit . Recommend d/c of glipizide, as patient notes he has been of for >15 years. 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 . Discussed addition of GLP1 to target weight loss, greater glycemic benefit vs starting Jardiance prescription. Patient elects to start Jardiance first. Counseled to take QAM and stay well hydrated.  . Continue Lantus 50 units QAM.  . At f/u with PCP, recommend titration of SGLT2 vs titration of insulin vs initiation of GLP1. Goal to reduce insulin burden to reduce risk of hypoglycemia and weight gain. Discussed positive benefits of weight loss w/ DM, HTN, and GERD  Patient Self Care Activities:  . Patient will check blood glucose BID, document, and provide at future appointments . Patient will take medications as prescribed . Patient will contact provider with any episodes of hypoglycemia . Patient will report any questions or concerns to provider   Initial goal documentation        Plan: - Scheduled f/u call 10/23/19 (~4 weeks after pt visit w/ PCP)  Catie Darnelle Maffucci, PharmD, BCACP, CPP Clinical Pharmacist [place] [phone]

## 2019-09-05 LAB — HM DIABETES EYE EXAM

## 2019-09-06 ENCOUNTER — Ambulatory Visit: Payer: Managed Care, Other (non HMO) | Admitting: Internal Medicine

## 2019-09-12 ENCOUNTER — Ambulatory Visit: Payer: Managed Care, Other (non HMO) | Admitting: Gastroenterology

## 2019-09-25 ENCOUNTER — Ambulatory Visit: Payer: Managed Care, Other (non HMO) | Admitting: Family

## 2019-10-03 ENCOUNTER — Other Ambulatory Visit: Payer: Self-pay | Admitting: Family Medicine

## 2019-10-03 DIAGNOSIS — E119 Type 2 diabetes mellitus without complications: Secondary | ICD-10-CM

## 2019-10-03 DIAGNOSIS — Z794 Long term (current) use of insulin: Secondary | ICD-10-CM

## 2019-10-17 ENCOUNTER — Encounter: Payer: Self-pay | Admitting: Family

## 2019-10-17 ENCOUNTER — Other Ambulatory Visit: Payer: Self-pay

## 2019-10-17 ENCOUNTER — Ambulatory Visit: Payer: Managed Care, Other (non HMO) | Admitting: Family

## 2019-10-17 VITALS — BP 168/90 | HR 79 | Temp 96.8°F | Ht 68.5 in | Wt 282.2 lb

## 2019-10-17 DIAGNOSIS — Z794 Long term (current) use of insulin: Secondary | ICD-10-CM

## 2019-10-17 DIAGNOSIS — E119 Type 2 diabetes mellitus without complications: Secondary | ICD-10-CM

## 2019-10-17 DIAGNOSIS — I471 Supraventricular tachycardia: Secondary | ICD-10-CM | POA: Diagnosis not present

## 2019-10-17 DIAGNOSIS — K760 Fatty (change of) liver, not elsewhere classified: Secondary | ICD-10-CM | POA: Diagnosis not present

## 2019-10-17 DIAGNOSIS — I10 Essential (primary) hypertension: Secondary | ICD-10-CM

## 2019-10-17 MED ORDER — FREESTYLE LIBRE 2 READER DEVI: 1.0000 | Freq: Every day | 0 refills | Status: DC

## 2019-10-17 MED ORDER — QUINAPRIL HCL 40 MG PO TABS: 40.0000 mg | ORAL_TABLET | Freq: Every day | ORAL | 1 refills | Status: DC

## 2019-10-17 MED ORDER — FREESTYLE LIBRE 2 SENSOR MISC: 2.0000 | 3 refills | Status: DC

## 2019-10-17 NOTE — Patient Instructions (Addendum)
Please call [facility] to schedule appointment for fatty liver disease at [phone]  Blood pressure is quite high today.  I would like for you to start the quinapril 40 mg as prescribed.  When you drop off a week blood sugars as discussed please also include blood pressure you can titrate blood pressure medications as needed as well.  KEYS TO REMEMBER WITH INSULIN THERAPY:   BG monitoring instructions: Patient is instructed to check her blood sugars 4 times a day, fasting in the morning 2 hours after lunch, 2 hours after dinner and at bedtime    If you have any symptoms of low blood sugar ( sweating, shakiness, lightheaded, dizzy) that you notify me. If you have a low, please drink a glass of orange juice and recheck blood sugar every 5 minutes until you don't feel symptomatic AND blood sugar is above 80.   Pair protein ( such as egg, peanut butter, avocado, [place] bacon, yogurt, deli meat) with your breakfast , particularly if primary consists of carbs.  This will stabilize blood sugar and also keep you fuller for longer.   PLENTY OF WATER.    Always carry glucose tablets with you. You may find these over the counter and in lots of different flavors.   Please check  blood sugars total of 4 times a day, fasting in the morning, before lunch, before lunch and at bedtime  Fasting blood sugar goal in the morning is greater than 80 and less than 120.   Before you eat, if you check blood sugar and it less than 120, DO NOT TAKE insulin aspart with meal.   If eating low carb meal, take LESS fast acting insulin.   Call [facility] clinic if: BG < 70 or > 300.  BOTTOM line: if most of your blood sugars fall between 140-150 whether fasting in the morning, before meals, then we will reach our goal of an a1c of 6.5%,    If you have any symptoms of low blood sugar ( sweating, shakiness, lightheaded, dizzy) , please notify me. If you have a low blood sugar, please drink a glass  of orange juice and recheck blood sugar every 5 minutes until you don't feel symptomatic AND blood sugar is above 80.   Call me with any concerns.     Managing Your Hypertension Hypertension is commonly called high blood pressure. This is when the force of your blood pressing against the walls of your arteries is too strong. Arteries are blood vessels that carry blood from your heart throughout your body. Hypertension forces the heart to work harder to pump blood, and may cause the arteries to become narrow or stiff. Having untreated or uncontrolled hypertension can cause heart attack, stroke, kidney disease, and other problems. What are blood pressure readings? A blood pressure reading consists of a higher number over a lower number. Ideally, your blood pressure should be below 120/80. The first ("top") number is called the systolic pressure. It is a measure of the pressure in your arteries as your heart beats. The second ("bottom") number is called the diastolic pressure. It is a measure of the pressure in your arteries as the heart relaxes. What does my blood pressure reading mean? Blood pressure is classified into four stages. Based on your blood pressure reading, your health care provider may use the following stages to determine what type of treatment you need, if any. Systolic pressure and diastolic pressure are measured in a unit called mm Hg.  Normal  Systolic pressure: below 123456.  Diastolic pressure: below 80. Elevated  Systolic pressure: [id number].  Diastolic pressure: below 80. Hypertension stage 1  Systolic pressure: 0000000.  Diastolic pressure: [id number]. Hypertension stage 2  Systolic pressure: [id number] or above.  Diastolic pressure: 90 or above. What health risks are associated with hypertension? Managing your hypertension is an important responsibility. Uncontrolled hypertension can lead to:  A heart attack.  A stroke.  A weakened blood vessel (aneurysm).  Heart  failure.  Kidney damage.  Eye damage.  Metabolic syndrome.  Memory and concentration problems. What changes can I make to manage my hypertension? Hypertension can be managed by making lifestyle changes and possibly by taking medicines. Your health care provider will help you make a plan to bring your blood pressure within a normal range. Eating and drinking   Eat a diet that is high in fiber and potassium, and low in salt (sodium), added sugar, and fat. An example eating plan is called the DASH (Dietary Approaches to Stop Hypertension) diet. To eat this way: ? Eat plenty of fresh fruits and vegetables. Try to fill half of your plate at each meal with fruits and vegetables. ? Eat whole grains, such as whole wheat pasta, brown rice, or whole grain bread. Fill about one quarter of your plate with whole grains. ? Eat low-fat diary products. ? Avoid fatty cuts of meat, processed or cured meats, and poultry with skin. Fill about one quarter of your plate with lean proteins such as fish, chicken without skin, beans, eggs, and tofu. ? Avoid premade and processed foods. These tend to be higher in sodium, added sugar, and fat.  Reduce your daily sodium intake. Most people with hypertension should eat less than 1,500 mg of sodium a day.  Limit alcohol intake to no more than 1 drink a day for nonpregnant women and 2 drinks a day for men. One drink equals 12 oz of beer, 5 oz of wine, or 1 oz of hard liquor. Lifestyle  Work with your health care provider to maintain a healthy body weight, or to lose weight. Ask what an ideal weight is for you.  Get at least 30 minutes of exercise that causes your heart to beat faster (aerobic exercise) most days of the week. Activities may include walking, swimming, or biking.  Include exercise to strengthen your muscles (resistance exercise), such as weight lifting, as part of your weekly exercise routine. Try to do these types of exercises for 30 minutes at least  3 days a week.  Do not use any products that contain nicotine or tobacco, such as cigarettes and e-cigarettes. If you need help quitting, ask your health care provider.  Control any long-term (chronic) conditions you have, such as high cholesterol or diabetes. Monitoring  Monitor your blood pressure at home as told by your health care provider. Your personal target blood pressure may vary depending on your medical conditions, your age, and other factors.  Have your blood pressure checked regularly, as often as told by your health care provider. Working with your health care provider  Review all the medicines you take with your health care provider because there may be side effects or interactions.  Talk with your health care provider about your diet, exercise habits, and other lifestyle factors that may be contributing to hypertension.  Visit your health care provider regularly. Your health care provider can help you create and adjust your plan for managing hypertension. Will I need medicine to control my blood  pressure? Your health care provider may prescribe medicine if lifestyle changes are not enough to get your blood pressure under control, and if:  Your systolic blood pressure is 130 or higher.  Your diastolic blood pressure is 80 or higher. Take medicines only as told by your health care provider. Follow the directions carefully. Blood pressure medicines must be taken as prescribed. The medicine does not work as well when you skip doses. Skipping doses also puts you at risk for problems. Contact a health care provider if:  You think you are having a reaction to medicines you have taken.  You have repeated (recurrent) headaches.  You feel dizzy.  You have swelling in your ankles.  You have trouble with your vision. Get help right away if:  You develop a severe headache or confusion.  You have unusual weakness or numbness, or you feel faint.  You have severe pain in your  chest or abdomen.  You vomit repeatedly.  You have trouble breathing. Summary  Hypertension is when the force of blood pumping through your arteries is too strong. If this condition is not controlled, it may put you at risk for serious complications.  Your personal target blood pressure may vary depending on your medical conditions, your age, and other factors. For most people, a normal blood pressure is less than 120/80.  Hypertension is managed by lifestyle changes, medicines, or both. Lifestyle changes include weight loss, eating a healthy, low-sodium diet, exercising more, and limiting alcohol. This information is not intended to replace advice given to you by your health care provider. Make sure you discuss any questions you have with your health care provider. Document Revised: 10/21/2018 Document Reviewed: 05/27/2016 Elsevier Patient Education  [place].

## 2019-10-17 NOTE — Assessment & Plan Note (Addendum)
Symptomatically improved on increased dose of metoprolol.  No changes to current regimen and patient understands to continue follow-up with cardiology

## 2019-10-17 NOTE — Assessment & Plan Note (Signed)
Uncontrolled today.  Patient has not been taking quinapril.  Advised to restart this medication and to keep a daily blood pressure log as we may need to adjust blood pressure medications

## 2019-10-17 NOTE — Assessment & Plan Note (Addendum)
Severely uncontrolled.  Discussed with patient very important to make lifestyle changes.  But declines referral nutrition at this time.  I have asked him to keep a diligent blood glucose log with 4 readings per day so we can safely adjust insulin.  Do not feel that increase Lantus to be useful at this time.  I think patient likely needs prandial insulin, SSI.  We have ordered a CGM in place over referral to chronic care management to help initiation of prandial insulin and surveillance thereof.

## 2019-10-17 NOTE — Progress Notes (Signed)
Subjective:    Patient ID: Dominic [name], male    DOB: [birth year], 49 y.o.   MRN: [id number]  CC: Dominic [name] is a 49 y.o. male who presents today for follow up.   HPI: Primarily concerned with elevated blood sugars.  Endorses polyuria, blurry vision, polydipsia, fatigue. Limiting carbs.  DM- compliant with jardiance, lantus 50 units  At night.  NO hypoglycemic episodes.  FBG 200 In the evenings may be as high as 400 2 hours after the meal. Typical post prandial is 300.   Breakfast- eggs , ice coffee from [employer] with 50/50 sugar  Lunch- take out from [employer]- spaghetti Very little snacking. Drinking diet soda. Occassional normal ( with sugar) Snapple.  Biggest meal is dinner and eats late as gets off from work at [employer] around 9pm. Bedtime at midnight.   HTN- at home 150/90. Compliant with amlodipine, HCTZ. Ran out of the quinlapril.  Every once in a while has BLE edema, improved today.Not worse with increase of amlodipine.    SVT-improved.  feels well on metoprolol 100mg  BID. Resting pulse in 70's  Only one episode of palpitations in past 3 months in which he had had caffeine. Hasnt used nitro in a couple of months since Feburary 2021.   Hepatic steatosis-  Missed appointment with GI    HISTORY:  Past Medical History:  Diagnosis Date  . Diabetes mellitus without complication ([place])   . GERD (gastroesophageal reflux disease)   . Hypertension   . Morbid obesity ([place])   . Psoriatic arthritis ([place])   . SVT (supraventricular tachycardia) ([place])    History reviewed. No pertinent surgical history. Family History  Problem Relation Age of Onset  . Arthritis Mother   . Heart disease Mother   . Supraventricular tachycardia Mother        s/p ablation  . Diabetes Father   . Heart attack Maternal Uncle   . Heart attack Maternal Uncle   . Heart attack Maternal Uncle   . Prostate cancer Neg Hx     Allergies: Patient has no known allergies. Current Outpatient  Medications on File Prior to Visit  Medication Sig Dispense Refill  . Adalimumab (HUMIRA PEN) 40 MG/0.4ML PNKT Inject into the skin.    Marland Kitchen amLODipine (NORVASC) 10 MG tablet Take 1 tablet (10 mg total) by mouth daily. 90 tablet 2  . aspirin 81 MG chewable tablet Chew by mouth daily.    . Cholecalciferol (VITAMIN D-3) 125 MCG (5000 UT) TABS Take 1 tablet by mouth daily.    . empagliflozin (JARDIANCE) 10 MG TABS tablet Take 10 mg by mouth every morning. 90 tablet 1  . glucose blood test strip 1 each by Other route as needed for other. Use to test blood sugar twice daily. 100 each 3  . hydrochlorothiazide (HYDRODIURIL) 25 MG tablet Take 1 tablet (25 mg total) by mouth daily. 90 tablet 1  . Lancets (ONETOUCH ULTRASOFT) lancets Used to check blood sugars twice daily. 100 each 12  . LANTUS SOLOSTAR 100 UNIT/ML Solostar Pen INJECT SUBCUTANEOUSLY 50  UNITS DAILY 45 mL 3  . [name] Take 5 mg by mouth every evening.     . metoprolol succinate (TOPROL-XL) 100 MG 24 hr tablet Take 1 tablet (100 mg total) by mouth 2 (two) times daily. Take with or immediately following a meal. 60 tablet 6  . Multiple Vitamin (MULTIVITAMIN) tablet Take 1 tablet by mouth daily.    . nitroGLYCERIN (NITROSTAT) 0.4 MG SL tablet  Place 1 tablet (0.4 mg total) under the tongue every 5 (five) minutes as needed for chest pain. 25 tablet 3  . omeprazole (PRILOSEC) 20 MG capsule Take 20 mg by mouth daily.    Marland Kitchen albuterol (VENTOLIN HFA) 108 (90 Base) MCG/ACT inhaler Inhale 2 puffs into the lungs every 6 (six) hours as needed for wheezing or shortness of breath. (Patient not taking: Reported on 10/17/2019) 6.7 g 1   No current facility-administered medications on file prior to visit.    Social History   Tobacco Use  . Smoking status: Never Smoker  . Smokeless tobacco: Never Used  Substance Use Topics  . Alcohol use: Yes    Alcohol/week: 1.0 standard drinks    Types: 1 Glasses of wine per week    Comment: very occasional  . Drug  use: Never    Review of Systems  Constitutional: Negative for chills and fever.  Eyes: Positive for visual disturbance.  Respiratory: Negative for cough and shortness of breath.   Cardiovascular: Positive for palpitations and leg swelling (improved). Negative for chest pain.  Gastrointestinal: Negative for nausea and vomiting.  Endocrine: Positive for polydipsia and polyuria.      Objective:    BP (!) 168/90   Pulse 79   Temp (!) 96.8 F (36 C) (Temporal)   Ht 5' 8.5" (1.74 m)   Wt 282 lb 3.2 oz (128 kg)   SpO2 98%   BMI 42.28 kg/m  BP Readings from Last 3 Encounters:  10/17/19 (!) 168/90  08/28/19 (!) 168/86  08/14/19 (!) 150/80   Wt Readings from Last 3 Encounters:  10/17/19 282 lb 3.2 oz (128 kg)  08/28/19 293 lb 8 oz (133.1 kg)  08/14/19 298 lb (135.2 kg)    Physical Exam Vitals reviewed.  Constitutional:      Appearance: He is well-developed.  Cardiovascular:     Rate and Rhythm: Regular rhythm.     Heart sounds: Normal heart sounds.  Pulmonary:     Effort: Pulmonary effort is normal. No respiratory distress.     Breath sounds: Normal breath sounds. No wheezing, rhonchi or rales.  Musculoskeletal:     Right lower leg: No edema.     Left lower leg: No edema.  Skin:    General: Skin is warm and dry.  Neurological:     Mental Status: He is alert.  Psychiatric:        Speech: Speech normal.        Behavior: Behavior normal.        Assessment & Plan:   Problem List Items Addressed This Visit      Cardiovascular and Mediastinum   SVT (supraventricular tachycardia) ([place])    Symptomatically improved on increased dose of metoprolol.  No changes to current regimen and patient understands to continue follow-up with cardiology      Uncontrolled hypertension - Primary    Uncontrolled today.  Patient has not been taking quinapril.  Advised to restart this medication and to keep a daily blood pressure log as we may need to adjust blood pressure medications        Relevant Orders   Comprehensive metabolic panel     Digestive   Hepatic steatosis    Again reemphasized the importance of this and given phone number to [facility].  Patient will call to schedule        Endocrine   Type 2 diabetes mellitus without complication, with long-term current use of insulin (HCC)    Severely  uncontrolled.  Discussed with patient very important to make lifestyle changes.  But declines referral nutrition at this time.  I have asked him to keep a diligent blood glucose log with 4 readings per day so we can safely adjust insulin.  Do not feel that increase Lantus to be useful at this time.  I think patient likely needs prandial insulin, SSI.  We have ordered a CGM in place over referral to chronic care management to help initiation of prandial insulin and surveillance thereof.      Relevant Orders   Referral to Chronic Care Management Services   Comprehensive metabolic panel   Lipid panel       I have discontinued Shanon Brow Naas's glipiZIDE. I am also having him maintain his Humira Pen, hydrochlorothiazide, albuterol, glucose blood, onetouch ultrasoft, [name], multivitamin, metoprolol succinate, nitroGLYCERIN, Vitamin D-3, Jardiance, aspirin, amLODipine, omeprazole, and Lantus SoloStar.   No orders of the defined types were placed in this encounter.   Return precautions given.   Risks, benefits, and alternatives of the medications and treatment plan prescribed today were discussed, and patient expressed understanding.   Education regarding symptom management and diagnosis given to patient on AVS.  Continue to follow with Burnard Hawthorne, FNP for routine health maintenance.   [name] and I agreed with plan.   Mable Paris, FNP

## 2019-10-17 NOTE — Assessment & Plan Note (Signed)
Again reemphasized the importance of this and given phone number to [facility].  Patient will call to schedule

## 2019-10-18 ENCOUNTER — Telehealth: Payer: Self-pay | Admitting: Family

## 2019-10-18 ENCOUNTER — Other Ambulatory Visit
Admission: RE | Admit: 2019-10-18 | Discharge: 2019-10-18 | Disposition: A | Discharge status: Home / Self Care | Payer: Managed Care, Other (non HMO) | Source: Ambulatory Visit | Attending: Family | Admitting: Family

## 2019-10-18 DIAGNOSIS — E875 Hyperkalemia: Secondary | ICD-10-CM

## 2019-10-18 DIAGNOSIS — E785 Hyperlipidemia, unspecified: Secondary | ICD-10-CM

## 2019-10-18 LAB — COMPREHENSIVE METABOLIC PANEL
ALT: 24 IU/L (ref 0–44)
AST: 22 IU/L (ref 0–40)
Albumin/Globulin Ratio: 1.5 (ref 1.2–2.2)
Albumin: 4.4 g/dL (ref 4.0–5.0)
Alkaline Phosphatase: 85 IU/L (ref 39–117)
BUN/Creatinine Ratio: 15 (ref 9–20)
BUN: 14 mg/dL (ref 6–24)
Bilirubin Total: 0.6 mg/dL (ref 0.0–1.2)
CO2: 23 mmol/L (ref 20–29)
Calcium: 9.7 mg/dL (ref 8.7–10.2)
Chloride: 96 mmol/L (ref 96–106)
Creatinine, Ser: 0.93 mg/dL (ref 0.76–1.27)
GFR calc Af Amer: 111 mL/min/{1.73_m2} (ref >59)
GFR calc non Af Amer: 96 mL/min/{1.73_m2} (ref >59)
Globulin, Total: 3 g/dL (ref 1.5–4.5)
Glucose: 246 mg/dL — ABNORMAL HIGH (ref 65–99)
Potassium: 5.7 mmol/L — ABNORMAL HIGH (ref 3.5–5.2)
Sodium: 137 mmol/L (ref 134–144)
Total Protein: 7.4 g/dL (ref 6.0–8.5)

## 2019-10-18 LAB — BASIC METABOLIC PANEL
Anion gap: 10 (ref 5–15)
BUN: 16 mg/dL (ref 6–20)
CO2: 25 mmol/L (ref 22–32)
Calcium: 8.9 mg/dL (ref 8.9–10.3)
Chloride: 99 mmol/L (ref 98–111)
Creatinine, Ser: 0.93 mg/dL (ref 0.61–1.24)
GFR calc Af Amer: >60 mL/min (ref >60)
GFR calc non Af Amer: >60 mL/min (ref >60)
Glucose, Bld: 377 mg/dL — ABNORMAL HIGH (ref 70–99)
Potassium: 3.7 mmol/L (ref 3.5–5.1)
Sodium: 134 mmol/L — ABNORMAL LOW (ref 135–145)

## 2019-10-18 LAB — LIPID PANEL
Chol/HDL Ratio: 8.7 ratio — ABNORMAL HIGH (ref 0.0–5.0)
Cholesterol, Total: 251 mg/dL — ABNORMAL HIGH (ref 100–199)
HDL: 29 mg/dL — ABNORMAL LOW (ref >39)
Triglycerides: 862 mg/dL (ref 0–149)

## 2019-10-18 MED ORDER — FENOFIBRATE 48 MG PO TABS: 48.0000 mg | ORAL_TABLET | Freq: Every day | ORAL | 1 refills | Status: DC

## 2019-10-18 NOTE — Telephone Encounter (Signed)
Call pt How is his bp today? He was to start back on the quinapril 40mg  since he had been off for 3 days when we saw him.

## 2019-10-18 NOTE — Telephone Encounter (Signed)
I spoke with patient & he has not yet got his atenolol for mail order. He did say that he was actually only on 5mg  of amlodipine at the time of BP check. He realized that he had been finishing up his old bottle. He sad that when his BP was checked yesterday at work it was 160/92. He was unsure why potassium was so high? He has drank a lot of Powerade sugar free the last few days. He also has used a salt substitute once last week. Other than that has not clue why. He said that he is having frequent urination at night as well as leg cramps he forgot to mention at visit. I told him that his blood sugars still running high could be why he urinates more due to being thirsty.   I let him know that I will call when I had labs back & knew how you wanted to treat.

## 2019-10-18 NOTE — Telephone Encounter (Signed)
Call pt Discussed potassium with Dr Derrel Nip We need to repeat STAT as elevated potassium can cause deadly heart arrhythmias.  Please ask him to [facility] it and return it to me STAT . He needs to do this TODAY.  If elevated above 5.5, I will send medication kayexalte which causes loose stools, thus rids of potassium STOP all salt substitutes.  Provide education that if he has palpitations  ( he has a h/o SVT), CP, sob, left arm numbness, vision changes, HA, he needs to call 911 or go to nearest ED.   In regards to blood pressure regimen, please start the amlodipine 10mg  as prescribed. I would like to see his blood pressure on this with HCTZ, metoprolol .   I know he doesn't have the quinapril since mail order yet, however ask him to HOLD this until his potassium is normal.   Lastly, his triglycerides are very high placing him at risk for pancreatitis. This should improve as sugars improve.  I have sent in medication , fenofibrate for him to start

## 2019-10-18 NOTE — Telephone Encounter (Signed)
noted 

## 2019-10-18 NOTE — Telephone Encounter (Signed)
I called patient & he was actually at the hospital at the time for work, so he would head into lab to get potassium rechecked. I explained to him this could cause deadly heart arrhythmias & needed to be drawn ASAP. He stated he had some palpitations on Sunday after drinking a lot of caffeine. He said that he does have some vision changes tha he mentioned when blood sugars are high. Patient advised to go to ED if an symptoms of chest pain, palpitations,SOB, HA, vision changes he needs to call 911 or go to ED. Pt verbalized understanding.  I let patient know to hold quinapril for now & to make sure he starts taking the 10mg  amlodipine.   Also he was informed that script for fenofibrate was went in for him to start for his triglycerides. Pt verbalized understanding.

## 2019-10-19 LAB — HEMOGLOBIN A1C
Hgb A1c MFr Bld: 10.6 % — ABNORMAL HIGH (ref 4.8–5.6)
Mean Plasma Glucose: 258 mg/dL

## 2019-10-23 ENCOUNTER — Encounter: Payer: Self-pay | Admitting: Pharmacist

## 2019-10-23 ENCOUNTER — Encounter: Payer: Self-pay | Admitting: Family

## 2019-10-23 ENCOUNTER — Ambulatory Visit: Payer: Managed Care, Other (non HMO) | Admitting: Pharmacist

## 2019-10-23 DIAGNOSIS — Z794 Long term (current) use of insulin: Secondary | ICD-10-CM

## 2019-10-23 DIAGNOSIS — E119 Type 2 diabetes mellitus without complications: Secondary | ICD-10-CM

## 2019-10-23 DIAGNOSIS — I10 Essential (primary) hypertension: Secondary | ICD-10-CM

## 2019-10-23 NOTE — Chronic Care Management (AMB) (Signed)
Chronic Care Management   Follow Up Note   10/23/2019 Name: Dominic [name] MRN: [id number] DOB: Jun 20, 1971  Referred by: Burnard Hawthorne, FNP Reason for referral : Chronic Care Management (Medication Management)   [name] is a 49 y.o. year old male who is a primary care patient of Burnard Hawthorne, FNP. The CCM team was consulted for assistance with chronic disease management and care coordination needs.    Contacted patient for medication management review.   Review of patient status, including review of consultants reports, relevant laboratory and other test results, and collaboration with appropriate care team members and the patient's provider was performed as part of comprehensive patient evaluation and provision of chronic care management services.    SDOH (Social Determinants of Health) assessments performed: No See Care Plan activities for detailed interventions related to [facility])     Outpatient Encounter Medications as of 10/23/2019  Medication Sig  . amLODipine (NORVASC) 10 MG tablet Take 1 tablet (10 mg total) by mouth daily.  . Cholecalciferol (VITAMIN D-3) 125 MCG (5000 UT) TABS Take 1 tablet by mouth daily.  . Continuous Blood Gluc Receiver (FREESTYLE LIBRE 2 READER) DEVI 1 Device by Does not apply route daily.  . empagliflozin (JARDIANCE) 10 MG TABS tablet Take 10 mg by mouth every morning.  . fenofibrate (TRICOR) 48 MG tablet Take 1 tablet (48 mg total) by mouth daily.  . hydrochlorothiazide (HYDRODIURIL) 25 MG tablet Take 1 tablet (25 mg total) by mouth daily.  Marland Kitchen LANTUS SOLOSTAR 100 UNIT/ML Solostar Pen INJECT SUBCUTANEOUSLY 50  UNITS DAILY  . metoprolol succinate (TOPROL-XL) 100 MG 24 hr tablet Take 1 tablet (100 mg total) by mouth 2 (two) times daily. Take with or immediately following a meal.  . quinapril (ACCUPRIL) 40 MG tablet Take 1 tablet (40 mg total) by mouth at bedtime.  . Adalimumab (HUMIRA PEN) 40 MG/0.4ML PNKT Inject into the skin.  Marland Kitchen albuterol  (VENTOLIN HFA) 108 (90 Base) MCG/ACT inhaler Inhale 2 puffs into the lungs every 6 (six) hours as needed for wheezing or shortness of breath. (Patient not taking: Reported on 10/17/2019)  . aspirin 81 MG chewable tablet Chew by mouth daily.  . Continuous Blood Gluc Sensor (FREESTYLE LIBRE 2 SENSOR) MISC 2 Devices by Does not apply route every 14 (fourteen) days.  Marland Kitchen glucose blood test strip 1 each by Other route as needed for other. Use to test blood sugar twice daily.  . Lancets (ONETOUCH ULTRASOFT) lancets Used to check blood sugars twice daily.  Marland Kitchen [name] Take 5 mg by mouth every evening.   . Multiple Vitamin (MULTIVITAMIN) tablet Take 1 tablet by mouth daily.  . nitroGLYCERIN (NITROSTAT) 0.4 MG SL tablet Place 1 tablet (0.4 mg total) under the tongue every 5 (five) minutes as needed for chest pain.  Marland Kitchen omeprazole (PRILOSEC) 20 MG capsule Take 20 mg by mouth daily.   No facility-administered encounter medications on file as of 10/23/2019.     Objective:   Goals Addressed            This Visit's Progress     Patient Stated   . "I want to work on my blood sugars" (pt-stated)       Dominic [name] (see longtitudinal plan of care for additional care plan information)  Current Barriers:  . Diabetes: uncontrolled; complicated by chronic medical conditions including HTN, SVT, overweight, most recent A1c 10.6% . Most recent eGFR: >60 . Current antihyperglycemic regimen: Jardiance 10 mg, Lantus 50 units daily  o Metformin XR - GI problems (diarrhea) . 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 Current blood glucose readings; FreeStyle Libre prescribed, but he has not heard anything from [facility] regarding this order.  o Fasting: Low 200s o Evening: 261 1.5 hours after supper; 242  . Cardiovascular risk reduction: o Current hypertensive regimen: quinapril 40 mg QAM, metoprolol succinate 100 mg BID, HCTZ 25 mg QAM, amlodipine 10 mg QAM; reports home readings 140-150s/80-90s. Has f/u with cardiology  later this week, reports that he will have to reschedulet his o Current hyperlipidemia regimen: fenofibrate 48 mg; TG >800, LDL unable to be calculated o Current antiplatelet regimen: ASA 81 mg daily  Pharmacist Clinical Goal(s):  Marland Kitchen Over the next 90 days, patient will work with PharmD and primary care provider to address   Interventions: . Reviewed glucose readings. Discussed increasing insulin vs addition of GLP1. Recommend addition of Ozempic to target both fasting and prandial sugar elevations. Patient is in agreement with this plan. Will collaborate w/ PCP.  Marland Kitchen Counseled that he only needs FreeStyle Libre sensor script, as he can download the [employer] on his phone to serve as the reader. He will contact [facility] regarding this today . Stressed the micro and macrovascular complications of uncontrolled diabetes . Reviewed importance of continuing to follow w/ cardiology. Discussed likely goal BP <130/80. Could consider changing HCTZ to chlorthalidone for greater potency. Could also change quinapril to 40 mg BID.  Marland Kitchen Moving forward, recommend addition of statin d/t hypercholesterolemia. Will continue to follow and counsel.  Patient Self Care Activities:  . Patient will check blood glucose BID, document, and provide at future appointments . Patient will take medications as prescribed . Patient will contact provider with any episodes of hypoglycemia . Patient will report any questions or concerns to provider   Please see past updates related to this goal by clicking on the "Past Updates" button in the selected goal          Plan:  - Scheduled f/u call 11/30/19  Catie Darnelle Maffucci, PharmD, BCACP, [place] Pharmacist [place] [phone]

## 2019-10-23 NOTE — Patient Instructions (Addendum)
Visit Information  Goals Addressed            This Visit's Progress     Patient Stated   . "I want to work on my blood sugars" (pt-stated)       [place] (see longtitudinal plan of care for additional care plan information)  Current Barriers:  . Diabetes: uncontrolled; complicated by chronic medical conditions including HTN, SVT, overweight, most recent A1c 10.6% . Most recent eGFR: >60 . Current antihyperglycemic regimen: Jardiance 10 mg, Lantus 50 units daily  o Metformin XR - GI problems (diarrhea) . 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 Current blood glucose readings; FreeStyle Libre prescribed, but he has not heard anything from [facility] regarding this order.  o Fasting: Low 200s o Evening: 261 1.5 hours after supper; 242  . Cardiovascular risk reduction: o Current hypertensive regimen: quinapril 40 mg QAM, metoprolol succinate 100 mg BID, HCTZ 25 mg QAM, amlodipine 10 mg QAM; reports home readings 140-150s/80-90s. Has f/u with cardiology later this week, reports that he will have to reschedulet his o Current hyperlipidemia regimen: fenofibrate 48 mg; TG >800, LDL unable to be calculated o Current antiplatelet regimen: ASA 81 mg daily  Pharmacist Clinical Goal(s):  Marland Kitchen Over the next 90 days, patient will work with PharmD and primary care provider to address   Interventions: . Reviewed glucose readings. Discussed increasing insulin vs addition of GLP1. Recommend addition of Ozempic to target both fasting and prandial sugar elevations. Patient is in agreement with this plan. Will collaborate w/ PCP.  Marland Kitchen Counseled that he only needs FreeStyle Libre sensor script, as he can download the [employer] on his phone to serve as the reader. He will contact [facility] regarding this today . Stressed the micro and macrovascular complications of uncontrolled diabetes . Reviewed importance of continuing to follow w/ cardiology. Discussed likely goal BP <130/80. Could consider changing HCTZ to  chlorthalidone for greater potency. Could also change quinapril to 40 mg BID.  Marland Kitchen Moving forward, recommend addition of statin d/t hypercholesterolemia. Will continue to follow and counsel.  Patient Self Care Activities:  . Patient will check blood glucose BID, document, and provide at future appointments . Patient will take medications as prescribed . Patient will contact provider with any episodes of hypoglycemia . Patient will report any questions or concerns to provider   Please see past updates related to this goal by clicking on the "Past Updates" button in the selected goal         Patient verbalizes understanding of instructions provided today.  Plan:  - Scheduled f/u call 11/30/19  Catie Darnelle Maffucci, PharmD, BCACP, CPP  Clinical Pharmacist  [place]  [phone]

## 2019-10-23 NOTE — Progress Notes (Signed)
Agree with plan. Tiny Rietz, NP  

## 2019-10-24 ENCOUNTER — Encounter: Payer: Self-pay | Admitting: Family

## 2019-10-25 ENCOUNTER — Encounter: Payer: Self-pay | Admitting: Family

## 2019-10-25 ENCOUNTER — Other Ambulatory Visit: Payer: Self-pay | Admitting: Family

## 2019-10-25 DIAGNOSIS — E119 Type 2 diabetes mellitus without complications: Secondary | ICD-10-CM

## 2019-10-25 MED ORDER — OZEMPIC (0.25 OR 0.5 MG/DOSE) 2 MG/1.5ML ~~LOC~~ SOPN: 0.2500 mg | PEN_INJECTOR | SUBCUTANEOUS | 3 refills | Status: DC

## 2019-10-27 ENCOUNTER — Ambulatory Visit: Payer: Managed Care, Other (non HMO) | Admitting: Internal Medicine

## 2019-11-10 ENCOUNTER — Telehealth: Payer: Self-pay | Admitting: Family

## 2019-11-10 NOTE — Telephone Encounter (Signed)
Left voicemail to reschedule 01/17/20 appointment

## 2019-11-30 ENCOUNTER — Telehealth: Payer: Managed Care, Other (non HMO)

## 2019-11-30 ENCOUNTER — Ambulatory Visit: Payer: Self-pay | Admitting: Pharmacist

## 2019-11-30 NOTE — Chronic Care Management (AMB) (Signed)
  Chronic Care Management   Note  11/30/2019 Name: Dominic [name] MRN: [id number] DOB: October 17, 1970  Dominic [name] is a 49 y.o. year old male who is a primary care patient of Burnard Hawthorne, FNP. The CCM team was consulted for assistance with chronic disease management and care coordination needs.    Attempted to contact patient for medication management review. Left HIPAA compliant message for patient to return my call at their convenience.   Plan: - Will collaborate with Care Guide to outreach to schedule follow up with me  Catie Darnelle Maffucci, PharmD, [place], [place] Pharmacist [place] [phone]

## 2019-12-01 ENCOUNTER — Telehealth: Payer: Self-pay | Admitting: Family

## 2019-12-01 NOTE — Chronic Care Management (AMB) (Signed)
  Care Management   Note  12/01/2019 Name: Dominic [name] MRN: [id number] DOB: 1971/03/01  Dominic [name] is a 49 y.o. year old male who is a primary care patient of Vidal Schwalbe, Yvetta Coder, FNP and is actively engaged with the care management team. I reached out to [name] by phone today to assist with re-scheduling a follow up visit with the Pharmacist  Follow up plan: Telephone appointment with care management team member scheduled for:01/12/2020  Noreene Larsson, [place], [place], [place] [address] Direct Dial: [phone] [email or web address]

## 2020-01-12 ENCOUNTER — Telehealth: Payer: Managed Care, Other (non HMO)

## 2020-01-12 ENCOUNTER — Ambulatory Visit: Payer: Self-pay | Admitting: Pharmacist

## 2020-01-12 NOTE — Chronic Care Management (AMB) (Signed)
  Chronic Care Management   Note  01/12/2020 Name: Dominic [name] MRN: [id number] DOB: 1971/01/06  Dominic [name] is a 49 y.o. year old male who is a primary care patient of Burnard Hawthorne, FNP. The CCM team was consulted for assistance with chronic disease management and care coordination needs.    Attempted to contact patient for medication management review. Left HIPAA compliant message for patient to return my call at their convenience.   Plan: - Will collaborate with Care Guide to outreach to schedule follow up with me  Catie Darnelle Maffucci, PharmD, [place], [place] Pharmacist [place] [phone]

## 2020-01-16 ENCOUNTER — Telehealth: Payer: Self-pay | Admitting: Family

## 2020-01-16 NOTE — Chronic Care Management (AMB) (Signed)
  Care Management   Note  01/16/2020 Name: Dominic [name] MRN: [id number] DOB: August 07, 1970  Dominic [name] is a 50 y.o. year old male who is a primary care patient of Vidal Schwalbe, Yvetta Coder, FNP and is actively engaged with the care management team. I reached out to [name] by phone today to assist with re-scheduling a follow up visit with the Pharmacist  Follow up plan: Telephone appointment with care management team member scheduled for:03/11/2020  Noreene Larsson, [place], [place], [place] [address] Direct Dial: [phone] [email or web address]: [email or web address]

## 2020-01-17 ENCOUNTER — Ambulatory Visit: Payer: Managed Care, Other (non HMO) | Admitting: Family

## 2020-01-30 ENCOUNTER — Ambulatory Visit: Payer: Managed Care, Other (non HMO) | Admitting: Family

## 2020-02-10 ENCOUNTER — Other Ambulatory Visit: Payer: Self-pay | Admitting: Family

## 2020-02-10 DIAGNOSIS — Z794 Long term (current) use of insulin: Secondary | ICD-10-CM

## 2020-02-10 DIAGNOSIS — E119 Type 2 diabetes mellitus without complications: Secondary | ICD-10-CM

## 2020-03-05 ENCOUNTER — Ambulatory Visit: Payer: Managed Care, Other (non HMO) | Admitting: Family

## 2020-03-07 ENCOUNTER — Other Ambulatory Visit: Payer: Self-pay | Admitting: Family

## 2020-03-07 ENCOUNTER — Other Ambulatory Visit: Payer: Self-pay

## 2020-03-07 ENCOUNTER — Ambulatory Visit: Payer: Managed Care, Other (non HMO) | Admitting: Family

## 2020-03-07 ENCOUNTER — Other Ambulatory Visit: Payer: Self-pay | Admitting: Family Medicine

## 2020-03-07 ENCOUNTER — Encounter: Payer: Self-pay | Admitting: Family

## 2020-03-07 VITALS — BP 142/90 | HR 94 | Temp 98.3°F | Resp 16 | Ht 68.5 in | Wt 269.4 lb

## 2020-03-07 DIAGNOSIS — E119 Type 2 diabetes mellitus without complications: Secondary | ICD-10-CM

## 2020-03-07 DIAGNOSIS — Z794 Long term (current) use of insulin: Secondary | ICD-10-CM | POA: Diagnosis not present

## 2020-03-07 DIAGNOSIS — I471 Supraventricular tachycardia: Secondary | ICD-10-CM

## 2020-03-07 DIAGNOSIS — E785 Hyperlipidemia, unspecified: Secondary | ICD-10-CM | POA: Diagnosis not present

## 2020-03-07 DIAGNOSIS — I10 Essential (primary) hypertension: Secondary | ICD-10-CM

## 2020-03-07 DIAGNOSIS — K219 Gastro-esophageal reflux disease without esophagitis: Secondary | ICD-10-CM

## 2020-03-07 DIAGNOSIS — E781 Pure hyperglyceridemia: Secondary | ICD-10-CM

## 2020-03-07 MED ORDER — FAMOTIDINE 20 MG PO TABS: 20.0000 mg | ORAL_TABLET | Freq: Two times a day (BID) | ORAL | 1 refills | Status: DC

## 2020-03-07 MED ORDER — FENOFIBRATE 48 MG PO TABS: 48.0000 mg | ORAL_TABLET | Freq: Every day | ORAL | 1 refills | Status: DC

## 2020-03-07 NOTE — Assessment & Plan Note (Signed)
Elevated today. He has not been on metoprolol BID. He will resume. If on 3 maximum doses of medications next week and we still do not have control, will consult cardiology and order US renal for resistant HTN work up.

## 2020-03-07 NOTE — Assessment & Plan Note (Addendum)
Frequency appears to be the same for patient. He has decreased metoprolol 100mg  to once daily and I have advised him to resume to twice daily. We have follow up in one week to  Monitor burden of palpitations and blood pressure. We are working on making an appointment with cardiology for follow up as he missed his 2 month follow up.

## 2020-03-07 NOTE — Assessment & Plan Note (Signed)
Severely elevated and doubt improved due to elevated glucose and he has not started fenofibrate.  Resent fenofibrate and explained importance of starting this medication

## 2020-03-07 NOTE — Patient Instructions (Addendum)
Please start metoprolol twice per day to see if blood pressure improves.   Please call and make a follow up with cardiology.   Stop jardiance as blood sugars had been so high; we may start this back once you are better controlled.   Check fasting blood sugars in the mornings, and then also post prandial ( 2 hours ) after lunch and dinner. Goal of post prandial < 180 and goal fasting is < 120.  Please start checking blood sugar at bedtime as well.   Please complete blood sugar logs so we can adjust insulin and look at starting meal dosed insulin. Bring this by in one week.   Decrease the ozempic to 0.25mg .   start pepcid ac  Please start fenofibrate.

## 2020-03-07 NOTE — Assessment & Plan Note (Signed)
Exacerbated by eating late and perhaps the ozempic 0.5mg ; we have decreased ozempic and may opt to discontinue all together is symptoms do not improve. Advised him to start pepcid BID to see if symptoms of GERD improve.

## 2020-03-07 NOTE — Assessment & Plan Note (Signed)
Severely uncontrolled and I have counseled on risks of end organ disease. Advised to hold jardiance until we gain control of blood sugar. I have asked Catie, pharmD to attend our follow up. Emphasized the importance of bringing blood sugar log with 4 blood glucose readings so we can consider prandial insulin. He will stay on lantus 60units, glipizide 10mg  bid, and ozempic .025 mg until follow up. I have also placed a referral to endocrine for insight as I have not been able to get patient's DM controlled.

## 2020-03-07 NOTE — Progress Notes (Signed)
I called and made patient appointment with Dominic [name] for 9/1. Pt is aware. Their office requested a referral be placed to them so they could link to patient's chart.

## 2020-03-07 NOTE — Progress Notes (Signed)
Subjective:    Patient ID: Dominic [name], male    DOB: 08-05-1970, 49 y.o.   MRN: [id number]  CC: Dominic [name] is a 49 y.o. male who presents today for follow up.   HPI:  Complains of epigastric burning worsened of late, worse at bedtime.Endorses that he belching more.  Eats late as gets off at 9pm. Feels that palpitaitons are aggrevated by epigastic burning. Takes prilosec twice per week as has been trying to limit use to side effects. Prilosec works well for him. NO regurgitation, nausea.   HTN- compliant with HCTZ 25mg  however twice per month notes leg cramps, and may miss a dose of HCTZ. He has only been taking metoprolol 100mg  daily. Compliant with Amlodipine 10mg , quinapril 40mg . Not checking blood pressure at home.    DM-Feels blood glucose has improved.  FBG 140 -200. Checks prior to dinner , around 200-300.  States largest meal is lunch. Drinking more water.  on glipizide 10mg  BID and lantus 60 units ( self increased from 50units). Recently increased  ozempic 0.5mg  however felt bloated on 0.5mg , felt better 0.25mg . No constipation.  Had been on jardiance however had intense urinary frequency, has since stopped.  No hypoglycemic episodes. Carries glucose tablets. No vision changes.   Never received the [facility] and fine with checking with glucometer.   Elevated triglycerides- hasnt started fenofibrate. Never received medication.     SVT-Feels 'stable'. SVT is not more frequent.  Continue to be more noticeable at bedtime. Has been taking metoprolol 100mg  daily for past week as read medication can cause palpitations.  Denies exertional chest pain or pressure, numbness or tingling radiating to left arm or jaw,dizziness, frequent headaches, changes in vision, or shortness of breath.   Gastroenterology 04/10/20  08/2019 Dominic [name], increased amlodipine to 10mg        HISTORY:  Past Medical History:  Diagnosis Date  . Diabetes mellitus without complication (Dominic [name])   .  GERD (gastroesophageal reflux disease)   . Hypertension   . Morbid obesity (Dominic [name])   . Psoriatic arthritis (Dominic [name])   . SVT (supraventricular tachycardia) (HCC)    No past surgical history on file. Family History  Problem Relation Age of Onset  . Arthritis Mother   . Heart disease Mother   . Supraventricular tachycardia Mother        s/p ablation  . Diabetes Father   . Heart attack Maternal Uncle   . Throat cancer Maternal Uncle   . Heart attack Maternal Uncle   . Heart attack Maternal Uncle   . Prostate cancer Neg Hx   . Thyroid cancer Neg Hx     Allergies: Patient has no known allergies. Current Outpatient Medications on File Prior to Visit  Medication Sig Dispense Refill  . Adalimumab (HUMIRA PEN) 40 MG/0.4ML PNKT Inject into the skin.    Marland Kitchen albuterol (VENTOLIN HFA) 108 (90 Base) MCG/ACT inhaler Inhale 2 puffs into the lungs every 6 (six) hours as needed for wheezing or shortness of breath. 6.7 g 1  . aspirin 81 MG chewable tablet Chew by mouth daily.    . Cholecalciferol (VITAMIN D-3) 125 MCG (5000 UT) TABS Take 1 tablet by mouth daily.    . Continuous Blood Gluc Receiver (FREESTYLE LIBRE 2 READER) DEVI 1 Device by Does not apply route daily. 1 each 0  . Continuous Blood Gluc Sensor (FREESTYLE LIBRE 2 SENSOR) MISC 2 Devices by Does not apply route every 14 (fourteen) days. 2 each 3  . glipiZIDE (GLUCOTROL) 10 MG  tablet Take 10 mg by mouth 2 (two) times daily before a meal.    . glucose blood test strip 1 each by Other route as needed for other. Use to test blood sugar twice daily. 100 each 3  . hydrochlorothiazide (HYDRODIURIL) 25 MG tablet Take 1 tablet (25 mg total) by mouth daily. 90 tablet 1  . Lancets (ONETOUCH ULTRASOFT) lancets Used to check blood sugars twice daily. 100 each 12  . LANTUS SOLOSTAR 100 UNIT/ML Solostar Pen INJECT SUBCUTANEOUSLY 50  UNITS DAILY (Patient taking differently: Inject 60 Units into the skin. ) 45 mL 3  . [name] Take 5 mg by mouth every  evening.     . metoprolol succinate (TOPROL-XL) 100 MG 24 hr tablet Take 1 tablet (100 mg total) by mouth 2 (two) times daily. Take with or immediately following a meal. 60 tablet 6  . Multiple Vitamin (MULTIVITAMIN) tablet Take 1 tablet by mouth daily.    . nitroGLYCERIN (NITROSTAT) 0.4 MG SL tablet Place 1 tablet (0.4 mg total) under the tongue every 5 (five) minutes as needed for chest pain. 25 tablet 3  . omeprazole (PRILOSEC) 20 MG capsule Take 20 mg by mouth daily.    . quinapril (ACCUPRIL) 40 MG tablet Take 1 tablet (40 mg total) by mouth at bedtime. 90 tablet 1  . Semaglutide,0.25 or 0.5MG /DOS, (OZEMPIC, 0.25 OR 0.5 MG/DOSE,) 2 MG/1.5ML SOPN Inject 0.25 mg into the skin once a week. After 4 weeks of 0.25mg  qwk, you may increase to 0.50mg  qwk. 3 pen 3  . amLODipine (NORVASC) 10 MG tablet Take 1 tablet (10 mg total) by mouth daily. 90 tablet 2   No current facility-administered medications on file prior to visit.    Social History   Tobacco Use  . Smoking status: Never Smoker  . Smokeless tobacco: Never Used  Vaping Use  . Vaping Use: Never used  Substance Use Topics  . Alcohol use: Yes    Alcohol/week: 1.0 standard drink    Types: 1 Glasses of wine per week    Comment: very occasional  . Drug use: Never    Review of Systems  Constitutional: Negative for chills and fever.  Respiratory: Negative for cough and shortness of breath.   Cardiovascular: Positive for palpitations (chronic intermittent). Negative for chest pain.  Gastrointestinal: Negative for nausea and vomiting.      Objective:    BP (!) 142/90   Pulse 94   Temp 98.3 F (36.8 C) (Oral)   Resp 16   Ht 5' 8.5" (1.74 m)   Wt 269 lb 6.4 oz (122.2 kg)   SpO2 99%   BMI 40.37 kg/m  BP Readings from Last 3 Encounters:  03/07/20 (!) 142/90  10/17/19 (!) 168/90  08/28/19 (!) 168/86   Wt Readings from Last 3 Encounters:  03/07/20 269 lb 6.4 oz (122.2 kg)  10/17/19 282 lb 3.2 oz (128 kg)  08/28/19 293 lb 8 oz  (133.1 kg)    Physical Exam Vitals reviewed.  Constitutional:      Appearance: Normal appearance. He is well-developed.  Cardiovascular:     Rate and Rhythm: Regular rhythm.     Heart sounds: Normal heart sounds.  Pulmonary:     Effort: Pulmonary effort is normal. No respiratory distress.     Breath sounds: Normal breath sounds. No wheezing or rales.  Abdominal:     General: Bowel sounds are normal. There is no distension.     Palpations: Abdomen is soft. Abdomen is not  rigid. There is no fluid wave or mass.     Tenderness: There is no abdominal tenderness. There is no guarding or rebound. Negative signs include Murphy's sign and McBurney's sign.  Skin:    General: Skin is warm and dry.  Neurological:     Mental Status: He is alert.  Psychiatric:        Speech: Speech normal.        Behavior: Behavior normal.        Assessment & Plan:   Problem List Items Addressed This Visit      Cardiovascular and Mediastinum   SVT (supraventricular tachycardia) ([place])    Frequency appears to be the same for patient. He has decreased metoprolol 100mg  to once daily and I have advised him to resume to twice daily. We have follow up in one week to  Monitor burden of palpitations and blood pressure. We are working on making an appointment with cardiology for follow up as he missed his 2 month follow up.       Relevant Medications   fenofibrate (TRICOR) 48 MG tablet   Uncontrolled hypertension    Elevated today. He has not been on metoprolol BID. He will resume. If on 3 maximum doses of medications next week and we still do not have control, will consult cardiology and order US renal for resistant HTN work up.       Relevant Medications   fenofibrate (TRICOR) 48 MG tablet     Digestive   GERD (gastroesophageal reflux disease)    Exacerbated by eating late and perhaps the ozempic 0.5mg ; we have decreased ozempic and may opt to discontinue all together is symptoms do not improve. Advised him  to start pepcid BID to see if symptoms of GERD improve.       Relevant Medications   famotidine (PEPCID) 20 MG tablet   Other Relevant Orders   Comprehensive metabolic panel     Endocrine   Type 2 diabetes mellitus without complication, with long-term current use of insulin (HCC) - Primary    Severely uncontrolled and I have counseled on risks of end organ disease. Advised to hold jardiance until we gain control of blood sugar. I have asked Catie, pharmD to attend our follow up. Emphasized the importance of bringing blood sugar log with 4 blood glucose readings so we can consider prandial insulin. He will stay on lantus 60units, glipizide 10mg  bid, and ozempic .025 mg until follow up. I have also placed a referral to endocrine for insight as I have not been able to get patient's DM controlled.       Relevant Medications   glipiZIDE (GLUCOTROL) 10 MG tablet   Other Relevant Orders   Ambulatory referral to Endocrinology   Hemoglobin A1c     Other   Hypertriglyceridemia    Severely elevated and doubt improved due to elevated glucose and he has not started fenofibrate.  Resent fenofibrate and explained importance of starting this medication      Relevant Medications   fenofibrate (TRICOR) 48 MG tablet    Other Visit Diagnoses    Hyperlipidemia, unspecified hyperlipidemia type       Relevant Medications   fenofibrate (TRICOR) 48 MG tablet   Other Relevant Orders   Comprehensive metabolic panel   Lipid panel       I have discontinued Thomasene Ripple. I am also having him start on famotidine. Additionally, I am having him maintain his Humira Pen, hydrochlorothiazide, albuterol, glucose blood, onetouch ultrasoft, MELATONIN  PO, multivitamin, metoprolol succinate, nitroGLYCERIN, Vitamin D-3, aspirin, amLODipine, omeprazole, Lantus SoloStar, FreeStyle Libre 2 Reader, FreeStyle Libre 2 Sensor, quinapril, Ozempic (0.25 or 0.5 MG/DOSE), glipiZIDE, and fenofibrate.   Meds ordered  this encounter  Medications  . famotidine (PEPCID) 20 MG tablet    Sig: Take 1 tablet (20 mg total) by mouth 2 (two) times daily.    Dispense:  60 tablet    Refill:  1    Order Specific Question:   Supervising Provider    Answer:   Deborra Medina L [[id number]]  . fenofibrate (TRICOR) 48 MG tablet    Sig: Take 1 tablet (48 mg total) by mouth daily.    Dispense:  90 tablet    Refill:  1    Order Specific Question:   Supervising Provider    Answer:   Crecencio Mc [[id number]]    Return precautions given.   Risks, benefits, and alternatives of the medications and treatment plan prescribed today were discussed, and patient expressed understanding.   Education regarding symptom management and diagnosis given to patient on AVS.  Continue to follow with Burnard Hawthorne, FNP for routine health maintenance.   [name] and I agreed with plan.   Mable Paris, FNP

## 2020-03-08 ENCOUNTER — Telehealth: Payer: Self-pay | Admitting: Family

## 2020-03-08 ENCOUNTER — Other Ambulatory Visit: Payer: Self-pay | Admitting: Family

## 2020-03-08 ENCOUNTER — Other Ambulatory Visit: Payer: Self-pay | Admitting: Family Medicine

## 2020-03-08 DIAGNOSIS — R748 Abnormal levels of other serum enzymes: Secondary | ICD-10-CM

## 2020-03-08 DIAGNOSIS — I471 Supraventricular tachycardia: Secondary | ICD-10-CM

## 2020-03-08 LAB — COMPREHENSIVE METABOLIC PANEL
ALT: 25 IU/L (ref 0–44)
AST: 27 IU/L (ref 0–40)
Albumin/Globulin Ratio: 1.2 (ref 1.2–2.2)
Albumin: 4.2 g/dL (ref 4.0–5.0)
Alkaline Phosphatase: 179 IU/L — ABNORMAL HIGH (ref 48–121)
BUN/Creatinine Ratio: 16 (ref 9–20)
BUN: 13 mg/dL (ref 6–24)
Bilirubin Total: 0.4 mg/dL (ref 0.0–1.2)
CO2: 25 mmol/L (ref 20–29)
Calcium: 9.4 mg/dL (ref 8.7–10.2)
Chloride: 94 mmol/L — ABNORMAL LOW (ref 96–106)
Creatinine, Ser: 0.79 mg/dL (ref 0.76–1.27)
GFR calc Af Amer: 121 mL/min/{1.73_m2} (ref >59)
GFR calc non Af Amer: 105 mL/min/{1.73_m2} (ref >59)
Globulin, Total: 3.4 g/dL (ref 1.5–4.5)
Glucose: 109 mg/dL — ABNORMAL HIGH (ref 65–99)
Potassium: 4.5 mmol/L (ref 3.5–5.2)
Sodium: 135 mmol/L (ref 134–144)
Total Protein: 7.6 g/dL (ref 6.0–8.5)

## 2020-03-08 LAB — LIPID PANEL
Chol/HDL Ratio: 6.8 ratio — ABNORMAL HIGH (ref 0.0–5.0)
Cholesterol, Total: 226 mg/dL — ABNORMAL HIGH (ref 100–199)
HDL: 33 mg/dL — ABNORMAL LOW (ref >39)
LDL Chol Calc (NIH): 170 mg/dL — ABNORMAL HIGH (ref 0–99)
Triglycerides: 123 mg/dL (ref 0–149)
VLDL Cholesterol Cal: 23 mg/dL (ref 5–40)

## 2020-03-08 LAB — HEMOGLOBIN A1C
Est. average glucose Bld gHb Est-mCnc: 246 mg/dL
Hgb A1c MFr Bld: 10.2 % — ABNORMAL HIGH (ref 4.8–5.6)

## 2020-03-08 NOTE — Addendum Note (Signed)
Addended by: Tor Netters I on: 03/08/2020 04:59 PM   Modules accepted: Orders

## 2020-03-08 NOTE — Telephone Encounter (Signed)
I added more labs Can they be added to yesterday's draw?

## 2020-03-11 ENCOUNTER — Telehealth: Payer: Managed Care, Other (non HMO)

## 2020-03-12 ENCOUNTER — Other Ambulatory Visit: Payer: Self-pay | Admitting: Family

## 2020-03-12 DIAGNOSIS — R7989 Other specified abnormal findings of blood chemistry: Secondary | ICD-10-CM

## 2020-03-13 ENCOUNTER — Other Ambulatory Visit: Payer: Self-pay

## 2020-03-13 ENCOUNTER — Ambulatory Visit (INDEPENDENT_AMBULATORY_CARE_PROVIDER_SITE_OTHER): Payer: Managed Care, Other (non HMO)

## 2020-03-13 ENCOUNTER — Encounter: Payer: Self-pay | Admitting: Family

## 2020-03-13 ENCOUNTER — Ambulatory Visit: Payer: Managed Care, Other (non HMO) | Admitting: Pharmacist

## 2020-03-13 ENCOUNTER — Ambulatory Visit: Payer: Managed Care, Other (non HMO) | Admitting: Family

## 2020-03-13 VITALS — BP 130/76 | HR 90 | Temp 98.1°F | Ht 68.5 in | Wt 269.9 lb

## 2020-03-13 VITALS — BP 146/92 | HR 92 | Ht 68.5 in | Wt 269.8 lb

## 2020-03-13 DIAGNOSIS — E119 Type 2 diabetes mellitus without complications: Secondary | ICD-10-CM

## 2020-03-13 DIAGNOSIS — K760 Fatty (change of) liver, not elsewhere classified: Secondary | ICD-10-CM

## 2020-03-13 DIAGNOSIS — Z23 Encounter for immunization: Secondary | ICD-10-CM | POA: Diagnosis not present

## 2020-03-13 DIAGNOSIS — R7989 Other specified abnormal findings of blood chemistry: Secondary | ICD-10-CM

## 2020-03-13 DIAGNOSIS — I10 Essential (primary) hypertension: Secondary | ICD-10-CM

## 2020-03-13 DIAGNOSIS — R140 Abdominal distension (gaseous): Secondary | ICD-10-CM

## 2020-03-13 DIAGNOSIS — Z794 Long term (current) use of insulin: Secondary | ICD-10-CM

## 2020-03-13 DIAGNOSIS — L4050 Arthropathic psoriasis, unspecified: Secondary | ICD-10-CM

## 2020-03-13 DIAGNOSIS — Z8679 Personal history of other diseases of the circulatory system: Secondary | ICD-10-CM

## 2020-03-13 DIAGNOSIS — R002 Palpitations: Secondary | ICD-10-CM | POA: Diagnosis not present

## 2020-03-13 MED ORDER — TELMISARTAN 40 MG PO TABS: 40.0000 mg | ORAL_TABLET | Freq: Every day | ORAL | 2 refills | Status: DC

## 2020-03-13 MED ORDER — INSULIN ASPART 100 UNIT/ML FLEXPEN: PEN_INJECTOR | SUBCUTANEOUS | 11 refills | Status: DC

## 2020-03-13 NOTE — Assessment & Plan Note (Signed)
Severely uncontrolled. Will start prandial insulin with lunch and dinner. Stop ozempic. Catie, pharm D gave him and applied libre CGM today. Education provided on frequency of scanning for glucose and parameters for glucose readings. He declines nutrition consult today. Catie will call him next week and he will follow up with me in October.

## 2020-03-13 NOTE — Progress Notes (Signed)
Office Visit    Patient Name: Dominic [name] Date of Encounter: 03/13/2020  Primary Care Provider:  Burnard Hawthorne, FNP Primary Cardiologist:  Nelva Bush, MD Electrophysiologist:  None   Chief Complaint    Dominic [name] is a 49 y.o. male with a hx of palpitations, chest pain, SVT, HTN, DM 2, arthritis, COVID-19, obesity presents today for worsening palpitations.  Past Medical History    Past Medical History:  Diagnosis Date  . Diabetes mellitus without complication ([place])   . GERD (gastroesophageal reflux disease)   . Hypertension   . Morbid obesity ([place])   . Psoriatic arthritis ([place])   . SVT (supraventricular tachycardia) ([place])    History reviewed. No pertinent surgical history.  Allergies  No Known Allergies  History of Present Illness    Dominic [name] is a 49 y.o. male with a hx of palpitations, chest pain, SVT, HTN, DM 2, arthritis, COVID-19, obesity last seen 08/28/2019.  Dx of SVT about 12 years ago by PCP. Placed on metoprolol by PCP but never saw cardiology. Sleep study approx 2019 with no sleep apnea. He was escorted to the ED due to chest pain and subtle EKG changes. Troponin negative x2. CTA negative for PE. CT did show slight atelectasis of RLL. CT showed hepatic steatosis for which was referred to GI by his PCP. Recommended for outpatient evaluation.    Lexiscan 08/18/19 was low risk with noted small mild fixed defect in apical region likely secondary to attenuation artifact and EF 40%. Echocardiogram 08/22/18 with LVEF 55-60%, no LV wall motion abnormalities, mildly elevated PASP, no significant valvular abnormalities.   Very pleasant gentleman who works for [employer].  At last clinic visit 08/28/2019 his amlodipine was increased to 10 mg daily due to uncontrolled blood pressure.  Since that time his primary care provider has increased his antihypertensive regimen as well without optimal blood pressure control.  Endorses worsening palpitations.  Tells me he feels  like his heart beats twice, skips a beat, beats twice, skips a beat and this makes him feel nauseous and fatigued.   He has upcoming appoint with GI.  Tells me he notices a "knot "underneath his xiphoid process that is more uncomfortable after eating.  He also has elevated alkaline phosphatase of 179.  He has an abdominal ultrasound upcoming  Does not snore at night. Wakes feeling mostly well rested.  Previous sleep study a number of years ago.  Tells me he does not feel sleep apnea is contributory to his problems.  EKGs/Labs/Other Studies Reviewed:   The following studies were reviewed today: Lexiscan 08/18/19 Pharmacological myocardial perfusion imaging study with no significant  Ischemia Small mild fixed defect apical region likely secondary to attenuation artifact Normal wall motion, EF estimated at 40% (depressed ejection fraction likely secondary to GI uptake artifact) No EKG changes concerning for ischemia at peak stress or in recovery. Low risk scan   Echo 08/23/19  1. Left ventricular ejection fraction, by estimation, is 55 to 60%. The  left ventricle has normal function. The left ventrical has no regional  wall motion abnormalities. Left ventricular diastolic parameters were  normal.   2. Right ventricular systolic function is normal. The right ventricular  size is normal. There is mildly elevated pulmonary artery systolic  pressure.   3. No left atrial/left atrial appendage thrombus was detected.   4. The mitral valve is normal in structure and function. no evidence of  mitral valve regurgitation.   5. The aortic valve is tricuspid. Aortic  valve regurgitation is not  visualized.   6. The inferior vena cava is normal in size with greater than 50%  respiratory variability, suggesting right atrial pressure of 3 mmHg.  Was already found another episode EKG:  EKG is ordered today.  The ekg ordered today demonstrates SR 92 bpm with occasional PVC and no acute ST/T wave changes.    Recent Labs: 07/19/2019: TSH 0.861 08/11/2019: B Natriuretic Peptide 53.0; Hemoglobin 15.8; Magnesium 1.8; Platelets 173 03/07/2020: ALT 25; BUN 13; Creatinine, Ser 0.79; Potassium 4.5; Sodium 135  Recent Lipid Panel    Component Value Date/Time   CHOL 226 (H) 03/07/2020 0902   TRIG 123 03/07/2020 0902   HDL 33 (L) 03/07/2020 0902   CHOLHDL 6.8 (H) 03/07/2020 0902   LDLCALC 170 (H) 03/07/2020 0902    Home Medications   Current Meds  Medication Sig  . Adalimumab (HUMIRA PEN) 40 MG/0.4ML PNKT Inject into the skin.  Marland Kitchen albuterol (VENTOLIN HFA) 108 (90 Base) MCG/ACT inhaler Inhale 2 puffs into the lungs every 6 (six) hours as needed for wheezing or shortness of breath.  Marland Kitchen amLODipine (NORVASC) 10 MG tablet Take 1 tablet (10 mg total) by mouth daily.  Marland Kitchen aspirin 81 MG chewable tablet Chew by mouth daily.  . Cholecalciferol (VITAMIN D-3) 125 MCG (5000 UT) TABS Take 1 tablet by mouth daily.  . Continuous Blood Gluc Receiver (FREESTYLE LIBRE 2 READER) DEVI 1 Device by Does not apply route daily.  . Continuous Blood Gluc Sensor (FREESTYLE LIBRE 2 SENSOR) MISC 2 Devices by Does not apply route every 14 (fourteen) days.  . famotidine (PEPCID) 20 MG tablet Take 1 tablet (20 mg total) by mouth 2 (two) times daily.  . fenofibrate (TRICOR) 48 MG tablet Take 1 tablet (48 mg total) by mouth daily.  Marland Kitchen glipiZIDE (GLUCOTROL) 10 MG tablet Take 10 mg by mouth 2 (two) times daily before a meal.  . glucose blood test strip 1 each by Other route as needed for other. Use to test blood sugar twice daily.  . hydrochlorothiazide (HYDRODIURIL) 25 MG tablet TAKE 1 TABLET BY MOUTH  DAILY  . Lancets (ONETOUCH ULTRASOFT) lancets Used to check blood sugars twice daily.  Marland Kitchen LANTUS SOLOSTAR 100 UNIT/ML Solostar Pen INJECT SUBCUTANEOUSLY 50  UNITS DAILY (Patient taking differently: Inject 60 Units into the skin. )  . [name] Take 5 mg by mouth every evening.   . metoprolol succinate (TOPROL-XL) 100 MG 24 hr tablet TAKE  1 TABLET BY MOUTH  DAILY WITH OR IMMEDIATELY  FOLLOWING A MEAL  . Multiple Vitamin (MULTIVITAMIN) tablet Take 1 tablet by mouth daily.  . nitroGLYCERIN (NITROSTAT) 0.4 MG SL tablet Place 1 tablet (0.4 mg total) under the tongue every 5 (five) minutes as needed for chest pain.  Marland Kitchen omeprazole (PRILOSEC) 20 MG capsule Take 20 mg by mouth daily.  . quinapril (ACCUPRIL) 40 MG tablet TAKE 1 TABLET BY MOUTH AT  BEDTIME  . Semaglutide,0.25 or 0.5MG /DOS, (OZEMPIC, 0.25 OR 0.5 MG/DOSE,) 2 MG/1.5ML SOPN Inject 0.25 mg into the skin once a week. After 4 weeks of 0.25mg  qwk, you may increase to 0.50mg  qwk.    Review of Systems   Review of Systems  Constitutional: Positive for malaise/fatigue. Negative for chills and fever.  Cardiovascular: Positive for dyspnea on exertion and palpitations. Negative for chest pain, leg swelling, near-syncope, orthopnea and syncope.  Respiratory: Negative for cough, shortness of breath and wheezing.   Gastrointestinal: Negative for nausea and vomiting.  Neurological: Negative for dizziness, light-headedness  and weakness.   All other systems reviewed and are otherwise negative except as noted above.  Physical Exam    VS:  BP (!) 146/92 (BP Location: Left Arm, Patient Position: Sitting, Cuff Size: Normal)   Pulse 92   Ht 5' 5.5" (1.664 m)   Wt 269 lb 12.8 oz (122.4 kg)   SpO2 97%   BMI 44.21 kg/m  , BMI Body mass index is 44.21 kg/m. GEN: Well nourished, overweight, well developed, in no acute distress. HEENT: normal. Neck: Supple, no JVD, carotid bruits, or masses. Cardiac: RRR, no murmurs, rubs, or gallops. No clubbing, cyanosis, edema.  Radials/DP/PT 2+ and equal bilaterally.  Respiratory:  Respirations regular and unlabored, clear to auscultation bilaterally. GI: Soft, nontender, nondistended, BS + x 4. MS: No deformity or atrophy. Skin: Warm and dry, no rash. Neuro:  Strength and sensation are intact. Psych: Normal affect.  Assessment & Plan     1. Uncontrolled hypertension-BP goal less than 130/80.  Echo 08/2019 EF 55 to 60%, normal LV diastolic parameters, borderline LVH, mildly elevated PASP, no significant valvular abnormalities. Continue Amlodipine 10mg  daily, Toprol 100mg  daily. Stop Benazepril. Start Telmisartan 40mg  daily. MyChart Message in 2 weeks to check in on BP. Defer transition from Toprol to Coreg as he has previously had difficulty with BID dosing. May be able to utilize alternate CCB such as Diltiazem, but await result of ZIO. Low suspicion OSA as has restorative sleep and no snoring. TSH 07/2019 normal. If unable to control BP, consider renal artery ultrasound.   2. DM2 - 03/07/20 A1c 10.2. Upcoming appointment with endocrinology.  3. Obesity -weight loss via healthy diet and regular exercise encouraged.  4. HLD - Lipid panel 03/07/20 total cholesterol 226, HDL 33, LDL 170, triglycerides 123. Fenofibrate discontinued by PCP as triglycerides well controlled and elevated akaline phosphatase. Would benefit from addition of statin. Discuss addition of statin at follow up pending evaluation with GI for elevated alkaline phosphatase.   5. SVT/palpitations -SVT diagnosed by previous PCP.  Notes increased palpitations and fatigue.  Continue present metoprolol dose.  14-day ZIO monitor placed in clinic. Normal electrolytes 03/07/20.   6. Hepatic steatosis/abdominal discomfort -incidental finding on CT angio.  Appointment with GI upcoming.  Abdominal ultrasound upcoming.  Disposition: Follow up in 6 week(s) with dr. Saunders Revel or APP   Loel Dubonnet, NP 03/13/2020, 11:17 AM

## 2020-03-13 NOTE — Assessment & Plan Note (Addendum)
Patient well appearing. Benign exam. No pain elicited on exam. He is not toxic in appearance. Differentials include side effect from ozempic ( haved dced for now), constipation, GERD. Presentation less consistent with PUD, [pancreatitis. Advised to start pepcid in addition to prilosec otc. Pending plain film today to evaluated for stool burden. He has upcoming [place] RUQ scheduled as well as GI appointment. Pending labs today. Close follow up.

## 2020-03-13 NOTE — Patient Instructions (Addendum)
Please start pepcid with prilosec and if you continue to have pain or worsening abdominal pain, let me know immediately.   Start aspart 10 unit with lunch and dinner.  Hold if blood glucose < 100 prior to meal OR if you are skipping a meal.  Please can libre 4 times per day.  One week follow up with Catie, pharmacist.   KEYS TO REMEMBER WITH INSULIN THERAPY:   Pair protein ( such as egg, peanut butter, avocado, [place] bacon, yogurt, deli meat) with your breakfast , particularly if primary consists of carbs.  This will stabilize blood sugar and also keep you fuller for longer.   PLENTY OF WATER.    Always carry glucose tablets with you. You may find these over the counter and in lots of different flavors.   Please check  blood sugars total of 4 times a day, fasting in the morning, before lunch, before lunch and at bedtime  Fasting blood sugar goal in the morning is greater than 80 and less than 120.   Before you eat, if you check blood sugar and it less than 120, DO NOT TAKE insulin aspart with meal.   If eating low carb meal, take LESS fast acting insulin.   Call [facility] clinic if: BG < 70 or > 300.  BOTTOM line: if most of your blood sugars fall between 140-150 whether fasting in the morning, before meals, then we will reach our goal of an a1c of 6.5%,    If you have any symptoms of low blood sugar ( sweating, shakiness, lightheaded, dizzy) , please notify me. If you have a low blood sugar, please drink a glass of orange juice and recheck blood sugar every 5 minutes until you don't feel symptomatic AND blood sugar is above 80.   Call me with any concerns.

## 2020-03-13 NOTE — Assessment & Plan Note (Signed)
Improved from prior. He will start telmisartan per cardiology.

## 2020-03-13 NOTE — Progress Notes (Signed)
Subjective:    Patient ID: Dominic [name], male    DOB: June 06, 1971, 49 y.o.   MRN: [id number]  CC: Dominic [name] is a 49 y.o. male who presents today for follow up.   HPI: Complains of dull bloating feeling across the upper abdomen for couple of weeks, slightly improved. Waxes and wanes. No fever. On prillosec 20mg  OTC. No epigastric burning.  Last bm was a day ago. Passing gas. More belching and gas past couple of days.  Took ozempic 0.25mg  10 days ago which is what he thought was causing pain as pain has improved. Plans to start pepcid ac today.  Pain is not exacerbated by eating, activity. Pain is not severe. No nausea, vomiting.   DM- with glucometer reports Fasting 242, 218, 141, 176, 112, 128, 168, Post prandial 300, 300, 220, 273; notes  [employer] breakfast on days in which 300.  lantus 60 units. No hypoglycemic episodes  Skips breakfast. Lunch is biggest meal. Eats dinner at 9pm   HLD-  Hasnt started Fenofibrate.   Wearing holter monitor and saw cardiology today. Caitlin changed Quinapril to Telmisartan 40mg .     [place] scheduled Upcoming appointment with Dr Dominic [name] , GI 04/10/20 HISTORY:  Past Medical History:  Diagnosis Date  . Diabetes mellitus without complication ([place])   . GERD (gastroesophageal reflux disease)   . Hypertension   . Morbid obesity ([place])   . Psoriatic arthritis ([place])   . SVT (supraventricular tachycardia) ([place])    History reviewed. No pertinent surgical history. Family History  Problem Relation Age of Onset  . Arthritis Mother   . Heart disease Mother   . Supraventricular tachycardia Mother        s/p ablation  . Diabetes Father   . Heart attack Maternal Uncle   . Throat cancer Maternal Uncle   . Heart attack Maternal Uncle   . Heart attack Maternal Uncle   . Prostate cancer Neg Hx   . Thyroid cancer Neg Hx     Allergies: Patient has no known allergies. Current Outpatient Medications on File Prior to Visit  Medication Sig Dispense Refill  .  Adalimumab (HUMIRA PEN) 40 MG/0.4ML PNKT Inject into the skin. (Patient not taking: Reported on 03/13/2020)    . albuterol (VENTOLIN HFA) 108 (90 Base) MCG/ACT inhaler Inhale 2 puffs into the lungs every 6 (six) hours as needed for wheezing or shortness of breath. 6.7 g 1  . amLODipine (NORVASC) 10 MG tablet Take 1 tablet (10 mg total) by mouth daily. 90 tablet 2  . aspirin 81 MG chewable tablet Chew by mouth daily. (Patient not taking: Reported on 03/13/2020)    . Cholecalciferol (VITAMIN D-3) 125 MCG (5000 UT) TABS Take 1 tablet by mouth daily. (Patient not taking: Reported on 03/13/2020)    . famotidine (PEPCID) 20 MG tablet Take 1 tablet (20 mg total) by mouth 2 (two) times daily. 60 tablet 1  . glipiZIDE (GLUCOTROL) 10 MG tablet Take 10 mg by mouth 2 (two) times daily before a meal.    . glucose blood test strip 1 each by Other route as needed for other. Use to test blood sugar twice daily. 100 each 3  . hydrochlorothiazide (HYDRODIURIL) 25 MG tablet TAKE 1 TABLET BY MOUTH  DAILY 90 tablet 3  . Lancets (ONETOUCH ULTRASOFT) lancets Used to check blood sugars twice daily. 100 each 12  . LANTUS SOLOSTAR 100 UNIT/ML Solostar Pen INJECT SUBCUTANEOUSLY 50  UNITS DAILY (Patient taking differently: Inject 60 Units into the skin. )  45 mL 3  . [name] Take 5 mg by mouth every evening.  (Patient not taking: Reported on 03/13/2020)    . metoprolol succinate (TOPROL-XL) 100 MG 24 hr tablet TAKE 1 TABLET BY MOUTH  DAILY WITH OR IMMEDIATELY  FOLLOWING A MEAL 90 tablet 3  . Multiple Vitamin (MULTIVITAMIN) tablet Take 1 tablet by mouth daily. (Patient not taking: Reported on 03/13/2020)    . nitroGLYCERIN (NITROSTAT) 0.4 MG SL tablet Place 1 tablet (0.4 mg total) under the tongue every 5 (five) minutes as needed for chest pain. 25 tablet 3  . Continuous Blood Gluc Receiver (FREESTYLE LIBRE 2 READER) DEVI 1 Device by Does not apply route daily. (Patient not taking: Reported on 03/13/2020) 1 each 0  . Continuous Blood  Gluc Sensor (FREESTYLE LIBRE 2 SENSOR) MISC 2 Devices by Does not apply route every 14 (fourteen) days. (Patient not taking: Reported on 03/13/2020) 2 each 3   No current facility-administered medications on file prior to visit.    Social History   Tobacco Use  . Smoking status: Never Smoker  . Smokeless tobacco: Never Used  Vaping Use  . Vaping Use: Never used  Substance Use Topics  . Alcohol use: Yes    Alcohol/week: 1.0 standard drink    Types: 1 Glasses of wine per week    Comment: very occasional  . Drug use: Never    Review of Systems  Constitutional: Negative for chills and fever.  Respiratory: Negative for cough and shortness of breath.   Cardiovascular: Negative for chest pain and palpitations.  Gastrointestinal: Positive for abdominal distention and constipation. Negative for abdominal pain, diarrhea, nausea and vomiting.      Objective:    BP 130/76   Pulse 90   Temp 98.1 F (36.7 C)   Ht 5' 8.5" (1.74 m)   Wt 269 lb 14.4 oz (122.4 kg)   SpO2 97%   BMI 40.44 kg/m  BP Readings from Last 3 Encounters:  03/13/20 130/76  03/13/20 (!) 146/92  03/07/20 (!) 142/90   Wt Readings from Last 3 Encounters:  03/13/20 269 lb 14.4 oz (122.4 kg)  03/13/20 269 lb 12.8 oz (122.4 kg)  03/07/20 269 lb 6.4 oz (122.2 kg)    Physical Exam Vitals reviewed.  Constitutional:      Appearance: Normal appearance. He is well-developed.  Cardiovascular:     Rate and Rhythm: Regular rhythm.     Heart sounds: Normal heart sounds.  Pulmonary:     Effort: Pulmonary effort is normal. No respiratory distress.     Breath sounds: Normal breath sounds. No wheezing or rales.  Abdominal:     General: Bowel sounds are normal. There is no distension.     Palpations: Abdomen is soft. Abdomen is not rigid. There is no fluid wave or mass.     Tenderness: There is no abdominal tenderness. There is no guarding or rebound. Negative signs include Murphy's sign and McBurney's sign.        Comments: Area noted epigastric where patient feels bloating. No pain with palpation today. Abdomen is soft, obese   Skin:    General: Skin is warm and dry.  Neurological:     Mental Status: He is alert.  Psychiatric:        Speech: Speech normal.        Behavior: Behavior normal.        Assessment & Plan:   Problem List Items Addressed This Visit      Cardiovascular and  Mediastinum   Uncontrolled hypertension    Improved from prior. He will start telmisartan per cardiology.         Endocrine   Type 2 diabetes mellitus without complication, with long-term current use of insulin (HCC)    Severely uncontrolled. Will start prandial insulin with lunch and dinner. Stop ozempic. Catie, pharm D gave him and applied libre CGM today. Education provided on frequency of scanning for glucose and parameters for glucose readings. He declines nutrition consult today. Catie will call him next week and he will follow up with me in October.       Relevant Medications   insulin aspart (NOVOLOG) 100 UNIT/ML FlexPen     Other   Bloating - Primary    Patient well appearing. Benign exam. No pain elicited on exam. He is not toxic in appearance. Differentials include side effect from ozempic ( haved dced for now), constipation, GERD. Presentation less consistent with PUD, [pancreatitis. Advised to start pepcid in addition to prilosec otc. Pending plain film today to evaluated for stool burden. He has upcoming [place] RUQ scheduled as well as GI appointment. Pending labs today. Close follow up.       Relevant Orders   DG Abd 1 View    Other Visit Diagnoses    Need for immunization against influenza       Relevant Orders   Flu Vaccine QUAD 36+ mos IM (Completed)   Elevated LFTs           I have discontinued Ileene Rubens Ozempic (0.25 or 0.5 MG/DOSE) and fenofibrate. I am also having him start on insulin aspart. Additionally, I am having him maintain his Humira Pen, albuterol, glucose blood, onetouch  ultrasoft, [name], multivitamin, nitroGLYCERIN, Vitamin D-3, aspirin, amLODipine, Lantus SoloStar, FreeStyle Libre 2 Reader, [employer] 2 Sensor, glipiZIDE, famotidine, hydrochlorothiazide, metoprolol succinate, and telmisartan.   Meds ordered this encounter  Medications  . insulin aspart (NOVOLOG) 100 UNIT/ML FlexPen    Sig: Take 10 units [place] 10 minutes prior to lunch and dinner.hold if glucose if less than 150    Dispense:  15 mL    Refill:  11    Order Specific Question:   Supervising Provider    Answer:   Crecencio Mc [[id number]]    Return precautions given.   Risks, benefits, and alternatives of the medications and treatment plan prescribed today were discussed, and patient expressed understanding.   Education regarding symptom management and diagnosis given to patient on AVS.  Continue to follow with Burnard Hawthorne, FNP for routine health maintenance.   [name] and I agreed with plan.   Mable Paris, FNP

## 2020-03-13 NOTE — Patient Instructions (Addendum)
Medication Instructions:  Your provider has recommended you make the following change in your medication:   STOP Quinapril   START Telmisartan 40mg  daily  Laurann Montana, NP will send you a MyChart message in 2 weeks to check on your blood pressure. Please check your blood pressure daily.  *If you need a refill on your cardiac medications before your next appointment, please call your pharmacy*  Lab Work: No lab work today  Testing/Procedures: Your EKG today shows normal sinus rhythm with occasional early beat in the bottom chambers called a PVC.  Please wear your ZIO monitor for 1-2 weeks then return in the mail. If you have significant symptoms the first week, go ahead and remove. If not, wear for 2 weeks.  Follow-Up: At [facility], you and your health needs are our priority.  As part of our continuing mission to provide you with exceptional heart care, we have created designated Provider Care Teams.  These Care Teams include your primary Cardiologist (physician) and Advanced Practice Providers (APPs -  Physician Assistants and Nurse Practitioners) who all work together to provide you with the care you need, when you need it.  We recommend signing up for the patient portal called "MyChart".  Sign up information is provided on this After Visit Summary.  MyChart is used to connect with patients for Virtual Visits (Telemedicine).  Patients are able to view lab/test results, encounter notes, upcoming appointments, etc.  Non-urgent messages can be sent to your provider as well.   To learn more about what you can do with MyChart, go to [email or web address].    Your next appointment:   6 week(s)  The format for your next appointment:   In Person  Provider:    You may see Nelva Bush, MD or one of the following Advanced Practice Providers on your designated Care Team:    Murray Hodgkins, NP  Christell Faith, PA-C  Marrianne Mood, PA-C  Laurann Montana, NP  Other  Instructions  Tips to Measure your Blood Pressure Correctly  To determine whether you have hypertension, a medical professional will take a blood pressure reading. How you prepare for the test, the position of your arm, and other factors can change a blood pressure reading by 10% or more. That could be enough to hide high blood pressure, start you on a drug you don't really need, or lead your doctor to incorrectly adjust your medications.  National and international guidelines offer specific instructions for measuring blood pressure. If a doctor, nurse, or medical assistant isn't doing it right, don't hesitate to ask him or her to get with the guidelines.  Here's what you can do to ensure a correct reading: . Don't drink a caffeinated beverage or smoke during the 30 minutes before the test. . Sit quietly for five minutes before the test begins. . During the measurement, sit in a chair with your feet on the floor and your arm supported so your elbow is at about heart level. . The inflatable part of the cuff should completely cover at least 80% of your upper arm, and the cuff should be placed on bare skin, not over a shirt. . Don't talk during the measurement. . Have your blood pressure measured twice, with a brief break in between. If the readings are different by 5 points or more, have it done a third time.  In 2017, new guidelines from the [place], the [employer] of Cardiology, and nine other health organizations lowered the diagnosis of high  blood pressure to 130/80 mm Hg or higher for all adults. The guidelines also redefined the various blood pressure categories to now include normal, elevated, Stage 1 hypertension, Stage 2 hypertension, and hypertensive crisis (see "Blood pressure categories").  Blood pressure categories  Blood pressure category SYSTOLIC (upper number)  DIASTOLIC (lower number)  Normal Less than 120 mm Hg and Less than 80 mm Hg  Elevated 120-129  mm Hg and Less than 80 mm Hg  High blood pressure: Stage 1 hypertension 130-139 mm Hg or 80-89 mm Hg  High blood pressure: Stage 2 hypertension 140 mm Hg or higher or 90 mm Hg or higher  Hypertensive crisis (consult your doctor immediately) Higher than 180 mm Hg and/or Higher than 120 mm Hg  Source: American Heart Association and American Stroke Association. For more on getting your blood pressure under control, buy Controlling Your Blood Pressure, a Special Health Report from [facility].   Blood Pressure Log   Date   Time  Blood Pressure  Position  Example: Nov 1 9 AM 124/78 sitting

## 2020-03-13 NOTE — Patient Instructions (Signed)
Visit Information  Goals Addressed              This Visit's Progress     Patient Stated   .  "I want to work on my blood sugars" (pt-stated)        Dominic [name] (see longtitudinal plan of care for additional care plan information)  Current Barriers:  . Social, financial, and community barriers: o Reports that [employer] is not letting him order [place], but he has not called to ask what the issue is . Diabetes: uncontrolled; complicated by chronic medical conditions including HTN, SVT, overweight, most recent A1c 10.2% . Most recent eGFR: >60 . Current antihyperglycemic regimen: Lantus 60 units daily, was on Ozempic 0.25 mg, has been off for a week and continues to have bloating, dull pain. Has been 10 weeks w/o and still concerns w/ nausea, stomach bloating  o Metformin XR - GI problems (diarrhea) . 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 Current blood glucose readings; FreeStyle Libre prescribed, but he has not heard anything from [facility] regarding this order.  o Fasting: 150s-202 o Evening: 250-300s . Cardiovascular risk reduction: sees cardiology. Wearing Zio today o Current hypertensive regimen: telmisartan 40 mg daily, metoprolol succinate 100 mg BID, HCTZ 25 mg QAM, amlodipine 10 mg QAM;  o Current hyperlipidemia regimen: never started fenofibrate; LDL significantly elevated at 170, but alk phos elevated on CMP o Current antiplatelet regimen: ASA 81 mg daily  Pharmacist Clinical Goal(s):  Marland Kitchen Over the next 90 days, patient will work with PharmD and primary care provider to address   Interventions: . Comprehensive medication review performed, medication list updated in electronic medical record . Inter-disciplinary care team collaboration (see longitudinal plan of care) . Reviewed sugar readings. Cannot use metformin d/t hx diarrhea w/ XR. Will continue to hold GLP1 given stomach upset. Appropriate to start prandial insulin at this time. Continue Lantus 60 units daily. Start  Novolog 10 units up to TID with meals.  . Provided with sample of FreeStyle Libre CGM 2 to start today. Patient to call pharmacy to f/u on [facility] sensor. Started on [employer]  . Reviewed cholesterol labs. Agree w/ PCP to work up cause of elevated alk phos prior to starting statin therapy.   Patient Self Care Activities:  . Patient will check blood glucose QID, document, and provide at future appointments . Patient will take medications as prescribed . Patient will contact provider with any episodes of hypoglycemia . Patient will report any questions or concerns to provider   Please see past updates related to this goal by clicking on the "Past Updates" button in the selected goal         The patient verbalized understanding of instructions provided today and declined a print copy of patient instruction materials.   Plan:  - Scheduled f/u call in 1 week to review sugars.   Catie Darnelle Maffucci, PharmD, [place], CPP Clinical Pharmacist [place] [phone]

## 2020-03-13 NOTE — Chronic Care Management (AMB) (Signed)
Chronic Care Management   Follow Up Note   03/13/2020 Name: Dominic [name] MRN: [id number] DOB: 1970-11-06  Referred by: Dominic Hawthorne, FNP Reason for referral : Chronic Care Management (Medication Management)   Dominic [name] is a 49 y.o. year old male who is a primary care patient of Dominic Hawthorne, FNP. The CCM team was consulted for assistance with chronic disease management and care coordination needs.    Met with patient face to face w/ PCP for co-visit.   Review of patient status, including review of consultants reports, relevant laboratory and other test results, and collaboration with appropriate care team members and the patient's provider was performed as part of comprehensive patient evaluation and provision of chronic care management services.    SDOH (Social Determinants of Health) assessments performed: Yes See Care Plan activities for detailed interventions related to SDOH)  SDOH Interventions     Most Recent Value  SDOH Interventions  Financial Strain Interventions Intervention Not Indicated       Outpatient Encounter Medications as of 03/13/2020  Medication Sig Note  . amLODipine (NORVASC) 10 MG tablet Take 1 tablet (10 mg total) by mouth daily.   . famotidine (PEPCID) 20 MG tablet Take 1 tablet (20 mg total) by mouth 2 (two) times daily.   Marland Kitchen glipiZIDE (GLUCOTROL) 10 MG tablet Take 10 mg by mouth 2 (two) times daily before a meal.   . hydrochlorothiazide (HYDRODIURIL) 25 MG tablet TAKE 1 TABLET BY MOUTH  DAILY   . LANTUS SOLOSTAR 100 UNIT/ML Solostar Pen INJECT SUBCUTANEOUSLY 50  UNITS DAILY (Patient taking differently: Inject 60 Units into the skin. ) 03/13/2020: 60 units daily  . metoprolol succinate (TOPROL-XL) 100 MG 24 hr tablet TAKE 1 TABLET BY MOUTH  DAILY WITH OR IMMEDIATELY  FOLLOWING A MEAL   . Adalimumab (HUMIRA PEN) 40 MG/0.4ML PNKT Inject into the skin. (Patient not taking: Reported on 03/13/2020)   . albuterol (VENTOLIN HFA) 108 (90 Base) MCG/ACT  inhaler Inhale 2 puffs into the lungs every 6 (six) hours as needed for wheezing or shortness of breath.   Marland Kitchen aspirin 81 MG chewable tablet Chew by mouth daily. (Patient not taking: Reported on 03/13/2020)   . Cholecalciferol (VITAMIN D-3) 125 MCG (5000 UT) TABS Take 1 tablet by mouth daily. (Patient not taking: Reported on 03/13/2020)   . Continuous Blood Gluc Receiver (FREESTYLE LIBRE 2 READER) DEVI 1 Device by Does not apply route daily. (Patient not taking: Reported on 03/13/2020)   . Continuous Blood Gluc Sensor (FREESTYLE LIBRE 2 SENSOR) MISC 2 Devices by Does not apply route every 14 (fourteen) days. (Patient not taking: Reported on 03/13/2020)   . glucose blood test strip 1 each by Other route as needed for other. Use to test blood sugar twice daily.   . Lancets (ONETOUCH ULTRASOFT) lancets Used to check blood sugars twice daily.   Marland Kitchen [name] Take 5 mg by mouth every evening.  (Patient not taking: Reported on 03/13/2020)   . Multiple Vitamin (MULTIVITAMIN) tablet Take 1 tablet by mouth daily. (Patient not taking: Reported on 03/13/2020)   . nitroGLYCERIN (NITROSTAT) 0.4 MG SL tablet Place 1 tablet (0.4 mg total) under the tongue every 5 (five) minutes as needed for chest pain.   Marland Kitchen telmisartan (MICARDIS) 40 MG tablet Take 1 tablet (40 mg total) by mouth daily.   . [DISCONTINUED] fenofibrate (TRICOR) 48 MG tablet Take 1 tablet (48 mg total) by mouth daily. (Patient not taking: Reported on 03/13/2020)   . [DISCONTINUED]  omeprazole (PRILOSEC) 20 MG capsule Take 20 mg by mouth daily.   . [DISCONTINUED] Semaglutide,0.25 or 0.5MG/DOS, (OZEMPIC, 0.25 OR 0.5 MG/DOSE,) 2 MG/1.5ML SOPN Inject 0.25 mg into the skin once a week. After 4 weeks of 0.50m qwk, you may increase to 0.552mqwk. (Patient not taking: Reported on 03/13/2020)    No facility-administered encounter medications on file as of 03/13/2020.     Objective:   Goals Addressed              This Visit's Progress     Patient Stated   .  "I want  to work on my blood sugars" (pt-stated)        [place] long[place] plan of care for additional care plan information)  Current Barriers:  . Social, financial, and community barriers: o Reports that [employer] not letting him order [place] he has not called to ask what the issue is . Diabetes: uncontrolled; complicated by chronic medical conditions including HTN, SVT, overweight, most recent A1c 10.2% . Most recent eGFR: >60 . Current antihyperglycemic regimen: Lantus 60 units daily, was on Ozempic 0.25 mg, has been off for a week and continues to have bloating, dull pain. Has been 10 weeks w/o and still concerns w/ nausea, stomach bloating  o Metformin XR - GI problems (diarrhea) . 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 Current blood glucose readings; FreeStyle Libre prescribed, but he has not heard anything from [facility] regarding this order.  o Fasting: 150s-202 o Evening: 250-300s . Cardiovascular risk reduction: sees cardiology. Wearing Zio today o Current hypertensive regimen: telmisartan 40 mg daily, metoprolol succinate 100 mg BID, HCTZ 25 mg QAM, amlodipine 10 mg QAM;  o Current hyperlipidemia regimen: never started fenofibrate; LDL significantly elevated at 170, but alk phos elevated on CMP o Current antiplatelet regimen: ASA 81 mg daily  Pharmacist Clinical Goal(s):  . Marland Kitchenver the next 90 days, patient will work with PharmD and primary care provider to address   Interventions: . Comprehensive medication review performed, medication list updated in electronic medical record . Inter-disciplinary care team collaboration (see longitudinal plan of care) . Reviewed sugar readings. Cannot use metformin d/t hx diarrhea w/ XR. Will continue to hold GLP1 given stomach upset. Appropriate to start prandial insulin at this time. Continue Lantus 60 units daily. Start Novolog 10 units up to TID with meals.  . Provided with sample of FreeStyle Libre CGM 2 to start today. [facility] Started on [employer]. Reviewed cholesterol labs. Agree w/ PCP to work up cause of elevated alk phos prior to starting statin therapy.   Patient Self Care Activities:  . Patient will check blood glucose QID, document, and provide at future appointments . Patient will take medications as prescribed . Patient will contact provider with any episodes of hypoglycemia . Patient will report any questions or concerns to provider   Please see past updates related to this goal by clicking on the "Past Updates" button in the selected goal          Plan:  - Scheduled f/u call in 1 week to review sugars.   Catie TrDarnelle MaffucciPharmD, [facility] NorthwestCPP Clinical Pharmacist [phone]

## 2020-03-14 ENCOUNTER — Telehealth: Payer: Self-pay

## 2020-03-14 NOTE — Telephone Encounter (Signed)
We received a PA for Novolog for patient. Okay to switch to Humalog pens?

## 2020-03-15 ENCOUNTER — Ambulatory Visit: Payer: Managed Care, Other (non HMO)

## 2020-03-15 ENCOUNTER — Telehealth: Payer: Self-pay | Admitting: Family

## 2020-03-15 ENCOUNTER — Other Ambulatory Visit: Payer: Self-pay

## 2020-03-15 MED ORDER — INSULIN LISPRO (1 UNIT DIAL) 100 UNIT/ML (KWIKPEN): PEN_INJECTOR | SUBCUTANEOUS | 5 refills | Status: DC

## 2020-03-15 NOTE — Telephone Encounter (Signed)
Add on sheet faxed 

## 2020-03-15 NOTE — Progress Notes (Signed)
Agree with plan. Dula Havlik, NP  

## 2020-03-15 NOTE — Telephone Encounter (Signed)
Did I write the novolog wrong?   Otherwise I will jist switch to humalog.

## 2020-03-15 NOTE — Telephone Encounter (Signed)
For some reason the lipase and amylase were not drawn 9 /1, is there  anyway this could be added?

## 2020-03-15 NOTE — Telephone Encounter (Signed)
Good morning Dominic [name]! Dominic [name] will need to come back and be redrawn. Those labs that are incomplete were labs that were added on 8/26 or 8/27.[facility] just did not have enough blood to support those test. After the initial blood draw was performed on the original labs ordered.

## 2020-03-15 NOTE — Telephone Encounter (Signed)
Absolutely OK to switch to Humalog if preferred. Let patient know please!

## 2020-03-15 NOTE — Telephone Encounter (Signed)
I have sent in Humalog kwikpens to [facility] for patient. Patient notified of this as well.

## 2020-03-16 LAB — ALKALINE PHOSPHATASE, ISOENZYMES
Alkaline Phosphatase: 172 IU/L — ABNORMAL HIGH (ref 48–121)
BONE FRACTION: 19 % (ref 12–68)
INTESTINAL FRAC.: 0 % (ref 0–18)
LIVER FRACTION: 81 % (ref 13–88)

## 2020-03-16 LAB — GAMMA GT: GGT: 159 IU/L — ABNORMAL HIGH (ref 0–65)

## 2020-03-19 ENCOUNTER — Telehealth: Payer: Self-pay | Admitting: Family

## 2020-03-19 DIAGNOSIS — R140 Abdominal distension (gaseous): Secondary | ICD-10-CM

## 2020-03-19 NOTE — Telephone Encounter (Signed)
Dominic [name] pt How is feeling?  Please review mychart result note regarding alk phos that I released today.  Please sch lipase, amylase lab as not enough blood when I tried to add on last week  Dominic [name],  Can we get Dominic [name] [place] RUQ scheduled asap?

## 2020-03-19 NOTE — Telephone Encounter (Signed)
I called [facility] & they have not gotten to the add on request yet. They are submitting a new request & will fax over a auth form to run test if they have enough sample left & if the sample is stable.

## 2020-03-19 NOTE — Telephone Encounter (Signed)
Ok received previous msg. Let me know if scheduling is needed. Thank you!

## 2020-03-19 NOTE — Telephone Encounter (Signed)
For some reason, I dont have lipase, amylase lab results yet Can you check on this?

## 2020-03-19 NOTE — Telephone Encounter (Signed)
Patient stated that his RUQ pain was dull & 90% gone. He said that he only feels it some right after he eats. He said that his bowel movements had gotten better since he increased fiber. Pt actually was the one who cancelled [place] of RUQ due to cost of $700. He wanted to wait until he saw GI on the 29th to see if he could have done there & what they order may cover more than just the liver? He was wanting the upper part of his stomach looked at as well.

## 2020-03-20 ENCOUNTER — Other Ambulatory Visit (INDEPENDENT_AMBULATORY_CARE_PROVIDER_SITE_OTHER): Payer: Managed Care, Other (non HMO)

## 2020-03-20 ENCOUNTER — Other Ambulatory Visit: Payer: Self-pay

## 2020-03-20 DIAGNOSIS — R140 Abdominal distension (gaseous): Secondary | ICD-10-CM | POA: Diagnosis not present

## 2020-03-20 LAB — AMYLASE

## 2020-03-20 LAB — SPECIMEN STATUS REPORT

## 2020-03-20 NOTE — Telephone Encounter (Signed)
Patient notified

## 2020-03-20 NOTE — Addendum Note (Signed)
Addended by: Tor Netters I on: 03/20/2020 10:55 AM   Modules accepted: Orders

## 2020-03-20 NOTE — Telephone Encounter (Signed)
Noted   Call pt  Yes,  Dr Loletha Carrow will cover more than liver and can discuss evaluation of stomach as well

## 2020-03-22 ENCOUNTER — Ambulatory Visit: Payer: Managed Care, Other (non HMO) | Admitting: Pharmacist

## 2020-03-22 DIAGNOSIS — E781 Pure hyperglyceridemia: Secondary | ICD-10-CM

## 2020-03-22 DIAGNOSIS — I10 Essential (primary) hypertension: Secondary | ICD-10-CM

## 2020-03-22 DIAGNOSIS — E119 Type 2 diabetes mellitus without complications: Secondary | ICD-10-CM

## 2020-03-22 DIAGNOSIS — Z794 Long term (current) use of insulin: Secondary | ICD-10-CM

## 2020-03-22 NOTE — Chronic Care Management (AMB) (Signed)
Chronic Care Management   Follow Up Note   03/22/2020 Name: Dominic [name] MRN: [id number] DOB: June 24, 1971  Referred by: Burnard Hawthorne, FNP Reason for referral : Chronic Care Management (Medication Management)   Dominic [name] is a 49 y.o. year old male who is a primary care patient of Burnard Hawthorne, FNP. The CCM team was consulted for assistance with chronic disease management and care coordination needs.    Contacted patient for medication management review.  Review of patient status, including review of consultants reports, relevant laboratory and other test results, and collaboration with appropriate care team members and the patient's provider was performed as part of comprehensive patient evaluation and provision of chronic care management services.    SDOH (Social Determinants of Health) assessments performed: No See Care Plan activities for detailed interventions related to [facility])     Outpatient Encounter Medications as of 03/22/2020  Medication Sig Note  . albuterol (VENTOLIN HFA) 108 (90 Base) MCG/ACT inhaler Inhale 2 puffs into the lungs every 6 (six) hours as needed for wheezing or shortness of breath.   Marland Kitchen amLODipine (NORVASC) 10 MG tablet Take 1 tablet (10 mg total) by mouth daily.   Marland Kitchen aspirin 81 MG chewable tablet Chew by mouth daily.    . Cholecalciferol (VITAMIN D-3) 125 MCG (5000 UT) TABS Take 1 tablet by mouth daily.    . Continuous Blood Gluc Receiver (FREESTYLE LIBRE 2 READER) DEVI 1 Device by Does not apply route daily.   . Continuous Blood Gluc Sensor (FREESTYLE LIBRE 2 SENSOR) MISC 2 Devices by Does not apply route every 14 (fourteen) days.   . famotidine (PEPCID) 20 MG tablet Take 1 tablet (20 mg total) by mouth 2 (two) times daily.   Marland Kitchen glipiZIDE (GLUCOTROL) 10 MG tablet Take 10 mg by mouth 2 (two) times daily before a meal.   . glucose blood test strip 1 each by Other route as needed for other. Use to test blood sugar twice daily.   . hydrochlorothiazide  (HYDRODIURIL) 25 MG tablet TAKE 1 TABLET BY MOUTH  DAILY   . insulin lispro (HUMALOG KWIKPEN) 100 UNIT/ML KwikPen Inject 10 units into the skin 10 minutes prior to lunch & dinner. Hold if glucose is less than 150.   Marland Kitchen Lancets (ONETOUCH ULTRASOFT) lancets Used to check blood sugars twice daily.   Marland Kitchen LANTUS SOLOSTAR 100 UNIT/ML Solostar Pen INJECT SUBCUTANEOUSLY 50  UNITS DAILY (Patient taking differently: Inject 60 Units into the skin. ) 03/22/2020: 50-60 units  . [name] Take 5 mg by mouth every evening.    . metoprolol succinate (TOPROL-XL) 100 MG 24 hr tablet TAKE 1 TABLET BY MOUTH  DAILY WITH OR IMMEDIATELY  FOLLOWING A MEAL   . Multiple Vitamin (MULTIVITAMIN) tablet Take 1 tablet by mouth daily.    . nitroGLYCERIN (NITROSTAT) 0.4 MG SL tablet Place 1 tablet (0.4 mg total) under the tongue every 5 (five) minutes as needed for chest pain.   Marland Kitchen telmisartan (MICARDIS) 40 MG tablet Take 1 tablet (40 mg total) by mouth daily.   . Adalimumab (HUMIRA PEN) 40 MG/0.4ML PNKT Inject into the skin. (Patient not taking: Reported on 03/13/2020)    No facility-administered encounter medications on file as of 03/22/2020.     Objective:   Goals Addressed              This Visit's Progress     Patient Stated   .  "I want to work on my blood sugars" (pt-stated)  CARE PLAN ENTRY (see longtitudinal plan of care for additional care plan information)  Current Barriers:  . Social, financial, and community barriers: o Reports that [facility] was supposed to be mailing CGM, but he hasn't heard on that. Plans to follow up w/ [facility] today . Diabetes: uncontrolled; complicated by chronic medical conditions including HTN, SVT, overweight, most recent A1c 10.2% . Most recent eGFR: >60 . Current antihyperglycemic regimen: Lantus 50 units daily, Humalog 10 units TID w/ meals, but holding if pre-meal BG <150, glipizide 10 mg BID o Metformin XR - GI problems (diarrhea) o Ozempic - severe GI upset . Reports  significant polyura  . Current blood glucose readings; FreeStyle Libre prescribed, but he has not heard anything from [facility] regarding this order.  o Fasting: 130-150s, 90-100s through the day o 2 hour post prandial: >250 if he skips Humalog d/t pre-meal BG being <150 . Cardiovascular risk reduction: sees cardiology. Wearing Zio today o Current hypertensive regimen: telmisartan 40 mg daily, metoprolol succinate 100 mg BID, HCTZ 25 mg QAM, amlodipine 10 mg QAM;  o Current hyperlipidemia regimen: never started fenofibrate; LDL significantly elevated at 170, but alk phos elevated on CMP- GI appt pending o Current antiplatelet regimen: ASA 81 mg daily  Pharmacist Clinical Goal(s):  Marland Kitchen Over the next 90 days, patient will work with PharmD and primary care provider to address   Interventions: . Comprehensive medication review performed, medication list updated in electronic medical record . Inter-disciplinary care team collaboration (see longitudinal plan of care) . Reviewed current regimen. Due to risk of hypoglycemia, recommend d/c glipizide. Given significant elevations if patient skips Humalog, recommend patient use Humalog with EVERY meal. Reviewed pathophysiology of endogenous insulin production and need for basal and bolus therapy. Discussed that if 2 hour post prandial elevations are >200, contact me or contact the office for titration recommendations for Humalog insulin. He verbalized understanding . Patient has not connected to office [facility] account. Re-sent email invite.   Patient Self Care Activities:  . Patient will check blood glucose QID, document, and provide at future appointments . Patient will take medications as prescribed . Patient will contact provider with any episodes of hypoglycemia . Patient will report any questions or concerns to provider   Please see past updates related to this goal by clicking on the "Past Updates" button in the selected goal          Plan:    - Scheduled f/u call in ~ 6 weeks (PCP f/u in ~ 3 weeks)  Catie Darnelle Maffucci, PharmD, [place], CPP Clinical Pharmacist [place] [phone]

## 2020-03-22 NOTE — Patient Instructions (Signed)
Visit Information  Goals Addressed              This Visit's Progress     Patient Stated     "I want to work on my blood sugars" (pt-stated)        Dominic [name] (see longtitudinal plan of care for additional care plan information)  Current Barriers:   Social, financial, and community barriers: o Reports that [facility] was supposed to be mailing CGM, but he hasn't heard on that. Plans to follow up w/ [facility] today  Diabetes: uncontrolled; complicated by chronic medical conditions including HTN, SVT, overweight, most recent A1c 10.2%  Most recent eGFR: >60  Current antihyperglycemic regimen: Lantus 50 units daily, Humalog 10 units TID w/ meals, but holding if pre-meal BG <150, glipizide 10 mg BID o Metformin XR - GI problems (diarrhea) o Ozempic - severe GI upset  Reports significant polyura   Current blood glucose readings; FreeStyle Libre prescribed, but he has not heard anything from [facility] regarding this order.  o Fasting: 130-150s, 90-100s through the day o 2 hour post prandial: >250 if he skips Humalog d/t pre-meal BG being <150  Cardiovascular risk reduction: sees cardiology. Wearing Zio today o Current hypertensive regimen: telmisartan 40 mg daily, metoprolol succinate 100 mg BID, HCTZ 25 mg QAM, amlodipine 10 mg QAM;  o Current hyperlipidemia regimen: never started fenofibrate; LDL significantly elevated at 170, but alk phos elevated on CMP- GI appt pending o Current antiplatelet regimen: ASA 81 mg daily  Pharmacist Clinical Goal(s):   Over the next 90 days, patient will work with PharmD and primary care provider to address   Interventions:  Comprehensive medication review performed, medication list updated in electronic medical record  Inter-disciplinary care team collaboration (see longitudinal plan of care)  Reviewed current regimen. Due to risk of hypoglycemia, recommend d/c glipizide. Given significant elevations if patient skips Humalog, recommend  patient use Humalog with EVERY meal. Reviewed pathophysiology of endogenous insulin production and need for basal and bolus therapy. Discussed that if 2 hour post prandial elevations are >200, contact me or contact the office for titration recommendations for Humalog insulin. He verbalized understanding  Patient has not connected to office [facility] account. Re-sent email invite.   Patient Self Care Activities:   Patient will check blood glucose QID, document, and provide at future appointments  Patient will take medications as prescribed  Patient will contact provider with any episodes of hypoglycemia  Patient will report any questions or concerns to provider   Please see past updates related to this goal by clicking on the "Past Updates" button in the selected goal         The patient verbalized understanding of instructions provided today and declined a print copy of patient instruction materials.   Plan:  - Scheduled f/u call in ~ 6 weeks (PCP f/u in ~ 3 weeks)  Catie Darnelle Maffucci, PharmD, [place], [place] Pharmacist [place] [phone]

## 2020-03-25 NOTE — Progress Notes (Signed)
I have collaborated with the care management provider regarding care management and care coordination activities outlined in this encounter and have reviewed this encounter including documentation in the note and care plan. Agree with Humalog d/t pre-meal BG being <150 and calling if blood sugar > 200 post prandial.    I am certifying that I agree with the content of this note and encounter as primary care provider.   Mable Paris, NP

## 2020-03-26 ENCOUNTER — Encounter: Payer: Self-pay | Admitting: Family

## 2020-04-10 ENCOUNTER — Other Ambulatory Visit: Payer: Managed Care, Other (non HMO)

## 2020-04-10 ENCOUNTER — Ambulatory Visit: Payer: Managed Care, Other (non HMO) | Admitting: Gastroenterology

## 2020-04-10 ENCOUNTER — Encounter: Payer: Self-pay | Admitting: Gastroenterology

## 2020-04-10 VITALS — BP 126/72 | HR 89 | Ht 68.5 in | Wt 265.1 lb

## 2020-04-10 DIAGNOSIS — R1013 Epigastric pain: Secondary | ICD-10-CM | POA: Diagnosis not present

## 2020-04-10 DIAGNOSIS — R140 Abdominal distension (gaseous): Secondary | ICD-10-CM | POA: Diagnosis not present

## 2020-04-10 DIAGNOSIS — R12 Heartburn: Secondary | ICD-10-CM

## 2020-04-10 DIAGNOSIS — R945 Abnormal results of liver function studies: Secondary | ICD-10-CM

## 2020-04-10 DIAGNOSIS — R110 Nausea: Secondary | ICD-10-CM | POA: Diagnosis not present

## 2020-04-10 DIAGNOSIS — K529 Noninfective gastroenteritis and colitis, unspecified: Secondary | ICD-10-CM | POA: Diagnosis not present

## 2020-04-10 DIAGNOSIS — R7989 Other specified abnormal findings of blood chemistry: Secondary | ICD-10-CM

## 2020-04-10 LAB — SPECIMEN STATUS REPORT

## 2020-04-10 LAB — AMYLASE: Amylase: 19 U/L — ABNORMAL LOW (ref 31–110)

## 2020-04-10 LAB — LIPASE: Lipase: 22 U/L (ref 13–78)

## 2020-04-10 NOTE — Patient Instructions (Signed)
If you are age 50 or older, your body mass index should be between 23-30. Your Body mass index is 39.73 kg/m. If this is out of the aforementioned range listed, please consider follow up with your Primary Care Provider.  If you are age 27 or younger, your body mass index should be between 19-25. Your Body mass index is 39.73 kg/m. If this is out of the aformentioned range listed, please consider follow up with your Primary Care Provider.   You have been scheduled for an endoscopy. Please follow written instructions given to you at your visit today. If you use inhalers (even only as needed), please bring them with you on the day of your procedure.  Your provider has requested that you go to the basement level for lab work before leaving today. Press "B" on the elevator. The lab is located at the first door on the left as you exit the elevator.   You have been scheduled for a CT scan of the abdomen and pelvis at [place] ([address]---this is in the same building as [employer]).   You are scheduled on 04-19-2020 at 930am. You should arrive 15 minutes prior to your appointment time for registration. Please follow the written instructions below on the day of your exam:  WARNING: IF YOU ARE ALLERGIC TO IODINE/X-RAY DYE, PLEASE NOTIFY RADIOLOGY IMMEDIATELY AT [phone]! YOU WILL BE GIVEN A 13 HOUR PREMEDICATION PREP.  1) Do not eat or drink anything after 530am (4 hours prior to your test) 2) You have been given 2 bottles of oral contrast to drink. The solution may taste better if refrigerated, but do NOT add ice or any other liquid to this solution. Shake well before drinking.     Drink 1 bottle of contrast @ 830am (1 hour prior to your exam)  You may take any medications as prescribed with a small amount of water, if necessary. If you take any of the following medications: METFORMIN, GLUCOPHAGE, GLUCOVANCE, AVANDAMET, RIOMET, FORTAMET, [place] MET, JANUMET,  GLUMETZA or METAGLIP, you MAY be asked to HOLD this medication 48 hours AFTER the exam.  The purpose of you drinking the oral contrast is to aid in the visualization of your intestinal tract. The contrast solution may cause some diarrhea. Depending on your individual set of symptoms, you may also receive an intravenous injection of x-ray contrast/dye. Plan on being at [facility] for 30 minutes or longer, depending on the type of exam you are having performed.  This test typically takes 30-45 minutes to complete.  If you have any questions regarding your exam or if you need to reschedule, you may call the CT department at [phone] between the hours of 8:00 am and 5:00 pm, Monday-Friday.  ________________________________________________________________________  Due to recent changes in healthcare laws, you may see the results of your imaging and laboratory studies on MyChart before your provider has had a chance to review them.  We understand that in some cases there may be results that are confusing or concerning to you. Not all laboratory results come back in the same time frame and the provider may be waiting for multiple results in order to interpret others.  Please give [place] 48 hours in order for your provider to thoroughly review all the results before contacting the office for clarification of your results.   It was a pleasure to see you today!  Dr. Loletha Carrow

## 2020-04-10 NOTE — Progress Notes (Signed)
[facility] Consult Note:  History: Dominic [name] 04/10/2020  Referring provider: Burnard Hawthorne, FNP  Reason for consult/chief complaint: Elevated Hepatic Enzymes (Patient is here to dicuss eleveated live enzymes), Abdominal Pain (RUQ abdominal that is intermittent and is dull ), and Gastroesophageal Reflux (Heart burn and acid reflux )   Subjective  HPI:  This is a very pleasant 49 year old man referred by primary care for abdominal pain and elevated alkaline phosphatase.  Several months ago he slowly developed bandlike upper abdominal dull discomfort with nausea that he attributed to starting Ozempic.  He was having frequent nausea at the time and that has improved along with the pain since stopping Ozempic over 2 months ago.  However, the symptoms still continue somewhat.  He was apparently having a clay colored stool that since stopped and continues to have dark urine.  He has had longstanding reflux symptoms of heartburn and regurgitation treated with OTC PPIs and H2 blockers with reasonably good symptom control.  He has had diabetes about 15 years, and says the control has been much worse since he had Covid in January of this year.  There has been occasional loose stool with no blood.  He also recently stopped his Humira (for psoriatic arthritis) because he thought it might be contributing to his digestive symptoms.  Alkaline phosphatase was recently noted to be elevated along with elevated GGT.  Right upper quadrant ultrasound was scheduled, but he canceled it until he came to this visit for advice.  From 03/13/2020 PCP P office note: " ROS:HPI: Complains of dull bloating feeling across the upper abdomen for couple of weeks, slightly improved. Waxes and wanes. No fever. On prillosec 20mg  OTC. No epigastric burning.  Last bm was a day ago. Passing gas. More belching and gas past couple of days.  Took ozempic 0.25mg  10 days ago which is what he thought was causing  pain as pain has improved. Plans to start pepcid ac today.  Pain is not exacerbated by eating, activity. Pain is not severe. No nausea, vomiting.    DM- with glucometer reports Fasting 242, 218, 141, 176, 112, 128, 168, Post prandial 300, 300, 220, 273; notes  [employer] breakfast on days in which 300.  lantus 60 units. No hypoglycemic episodes  Skips breakfast. Lunch is biggest meal. Eats dinner at 9pm"  Ozempic was discontinued and insulin adjusted  Review of Systems  Constitutional: Positive for fatigue. Negative for appetite change and unexpected weight change.  HENT: Negative for mouth sores and voice change.   Eyes: Negative for pain and redness.  Respiratory: Negative for cough and shortness of breath.   Cardiovascular: Negative for chest pain and palpitations.  Genitourinary: Negative for dysuria and hematuria.  Musculoskeletal: Positive for arthralgias and myalgias.  Skin: Negative for pallor and rash.  Neurological: Negative for weakness and headaches.  Hematological: Negative for adenopathy.     Past Medical History: Past Medical History:  Diagnosis Date   Diabetes mellitus without complication (HCC)    GERD (gastroesophageal reflux disease)    High triglycerides    Hypertension    Long-term insulin use in type 2 diabetes ([place])    Morbid obesity (HCC)    Psoriatic arthritis (HCC)    SVT (supraventricular tachycardia) ([place])      Past Surgical History: History reviewed. No pertinent surgical history.   Family History: Family History  Problem Relation Age of Onset   Arthritis Mother    Heart disease Mother    Supraventricular tachycardia Mother  s/p ablation   Diabetes Father    Heart attack Maternal Uncle    Throat cancer Maternal Uncle    Heart attack Maternal Uncle    Heart attack Maternal Uncle    Prostate cancer Neg Hx    Thyroid cancer Neg Hx     Social History: Social History   Socioeconomic History   Marital  status: Single    Spouse name: Not on file   Number of children: Not on file   Years of education: Not on file   Highest education level: Not on file  Occupational History   Not on file  Tobacco Use   Smoking status: Never Smoker   Smokeless tobacco: Never Used  Vaping Use   Vaping Use: Never used  Substance and Sexual Activity   Alcohol use: Yes    Alcohol/week: 1.0 standard drink    Types: 1 Glasses of wine per week    Comment: very occasional   Drug use: Never   Sexual activity: Not on file  Other Topics Concern   Not on file  Social History Narrative   Lives in [place] w/ a roommate.  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  Does not routinely exercise.   Social Determinants of Health   Financial Resource Strain: Low Risk    Difficulty of Paying Living Expenses: Not hard at all  Food Insecurity:    Worried About Charity fundraiser in the Last Year: Not on file   [employer] of Food in the Last Year: Not on file  Transportation Needs:    Lack of Transportation (Medical): Not on file   Lack of Transportation (Non-Medical): Not on file  Physical Activity:    Days of Exercise per Week: Not on file   Minutes of Exercise per Session: Not on file  Stress:    Feeling of Stress : Not on file  Social Connections:    Frequency of Communication with Friends and Family: Not on file   Frequency of Social Gatherings with Friends and Family: Not on file   Attends Religious Services: Not on file   Active Member of Clubs or Organizations: Not on file   Attends Archivist Meetings: Not on file   Marital Status: Not on file   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 6.7 g 1   famotidine (PEPCID) 20 MG tablet  Take 1 tablet (20 mg total) by mouth 2 (two) times daily. 60 tablet 1   glucose blood test strip 1 each by Other route as needed for other. Use to test blood sugar twice daily. 100 each 3   hydrochlorothiazide (HYDRODIURIL) 25 MG tablet TAKE 1 TABLET BY MOUTH  DAILY 90 tablet 3   insulin lispro (HUMALOG KWIKPEN) 100 UNIT/ML KwikPen Inject 10 units into the skin 10 minutes prior to lunch & dinner. Hold if glucose is less than 150. 15 mL 5   Lancets (ONETOUCH ULTRASOFT) lancets Used to check blood sugars twice daily. 100 each 12   LANTUS SOLOSTAR 100 UNIT/ML Solostar Pen INJECT SUBCUTANEOUSLY 50  UNITS DAILY (Patient taking differently: Inject 60 Units into the skin. ) 45 mL 3   [name] Take 5 mg by mouth every evening.      metoprolol succinate (  TOPROL-XL) 100 MG 24 hr tablet TAKE 1 TABLET BY MOUTH  DAILY WITH OR IMMEDIATELY  FOLLOWING A MEAL 90 tablet 3   telmisartan (MICARDIS) 40 MG tablet Take 1 tablet (40 mg total) by mouth daily. 30 tablet 2   amLODipine (NORVASC) 10 MG tablet Take 1 tablet (10 mg total) by mouth daily. 90 tablet 2   No current facility-administered medications for this visit.      ___________________________________________________________________ Objective   Exam:  BP 126/72    Pulse 89    Ht 5' 8.5" (1.74 m)    Wt 265 lb 2 oz (120.3 kg)    BMI 39.73 kg/m    General: Not acutely ill-appearing, normal vocal quality  Eyes: sclera anicteric, no redness  ENT: oral mucosa moist without lesions, no cervical or supraclavicular lymphadenopathy  CV: RRR without murmur, S1/S2, no JVD, no peripheral edema  Resp: clear to auscultation bilaterally, normal RR and effort noted  GI: soft, mild bandlike upper tenderness, with active bowel sounds. No guarding or palpable organomegaly noted, limited by body habitus  Skin; warm and dry, no rash or jaundice noted  Neuro: awake, alert and oriented x 3. Normal gross motor function and fluent speech  Labs:  CMP  Latest Ref Rng & Units 03/13/2020 03/07/2020 10/18/2019  Glucose 65 - 99 mg/dL - 109(H) 377(H)  BUN 6 - 24 mg/dL - 13 16  Creatinine 0.76 - 1.27 mg/dL - 0.79 0.93  Sodium 134 - 144 mmol/L - 135 134(L)  Potassium 3.5 - 5.2 mmol/L - 4.5 3.7  Chloride 96 - 106 mmol/L - 94(L) 99  CO2 20 - 29 mmol/L - 25 25  Calcium 8.7 - 10.2 mg/dL - 9.4 8.9  Total Protein 6.0 - 8.5 g/dL - 7.6 -  Total Bilirubin 0.0 - 1.2 mg/dL - 0.4 -  Alkaline Phos 48 - 121 IU/L 172(H) 179(H) -  AST 0 - 40 IU/L - 27 -  ALT 0 - 44 IU/L - 25 -   Alkaline phosphatase was normal in April of this year and on multiple prior values  GGT elevated at 159 (normal up to 65)    CBC Latest Ref Rng & Units 08/11/2019 07/19/2019 03/03/2019  WBC 4.0 - 10.5 K/uL 8.5 6.1 9.3  Hemoglobin 13.0 - 17.0 g/dL 15.8 17.8(H) 16.2  Hematocrit 39 - 52 % 45.8 52.1(H) 48.8  Platelets 150 - 400 K/uL 173 181 259   Lipid Panel     Component Value Date/Time   CHOL 226 (H) 03/07/2020 0902   TRIG 123 03/07/2020 0902   HDL 33 (L) 03/07/2020 0902   CHOLHDL 6.8 (H) 03/07/2020 0902   LDLCALC 170 (H) 03/07/2020 0902   LABVLDL 23 03/07/2020 0902   Lab Results  Component Value Date   HGBA1C 10.2 (H) 03/07/2020    Radiologic Studies: No abdominal imaging  Assessment: Encounter Diagnoses  Name Primary?   Epigastric pain Yes   Nausea in adult    Abdominal bloating    Chronic diarrhea    LFTs abnormal    Heartburn     Several months of upper abdominal discomfort with nausea and bloating, much of which improved after stopping Ozempic but persists.  His poorly controlled diabetes is most likely at least part of this problem. Isolated elevation of alkaline phosphatase along with elevated GGT is of unclear cause in relation to the upper digestive symptoms.  None of his medicines seem likely culprits, including the Ozempic which was long since discontinued. Has psoriatic arthritis, so  must consider autoimmune liver disease.  If related to upper  abdominal pain, consider gallstones and choledocholithiasis, less likely neoplasm. Plan:  CT abdomen and pelvis with oral and IV contrast as a more detailed imaging study for these various symptoms then ultrasound would be. Upper endoscopy.  He was agreeable after discussion of procedure and risks.  The benefits and risks of the planned procedure were described in detail with the patient or (when appropriate) their health care proxy.  Risks were outlined as including, but not limited to, bleeding, infection, perforation, adverse medication reaction leading to cardiac or pulmonary decompensation, pancreatitis (if ERCP).  The limitation of incomplete mucosal visualization was also discussed.  No guarantees or warranties were given.  Patient at increased risk for cardiopulmonary complications of procedure due to medical comorbidities.  I also brought up colon cancer screening and the need for colonoscopy, but he would like to hold off on that and focus on the upper digestive symptoms first.  After the above work-up and when he follows up, he will need serologies for autoimmune liver disease if the alkaline phosphatase does not normalize.  Thank you for the courtesy of this consult.  Please call me with any questions or concerns.  Nelida Meuse III  CC: Referring provider noted above

## 2020-04-11 LAB — CREATININE, SERUM
Creatinine, Ser: 0.91 mg/dL (ref 0.76–1.27)
GFR calc Af Amer: 113 mL/min/{1.73_m2} (ref >59)
GFR calc non Af Amer: 98 mL/min/{1.73_m2} (ref >59)

## 2020-04-11 LAB — BUN: BUN: 18 mg/dL (ref 6–24)

## 2020-04-15 ENCOUNTER — Telehealth: Payer: Self-pay | Admitting: Family

## 2020-04-15 MED ORDER — METOPROLOL SUCCINATE ER 25 MG PO TB24: ORAL_TABLET | ORAL | 3 refills | Status: DC

## 2020-04-15 NOTE — Telephone Encounter (Signed)
Loel Dubonnet, NP  04/15/2020 8:26 AM EDT     Monitor shows predominantly normal sinus rhythm. Occasional early beats in the bottom chamber of his heart called PVC's. Most triggered events were associated with sinus tachycardia or PVCs. Add Metoprolol Succinate 25mg  at bedtime (presently taking 100mg  QAM) for additional control.

## 2020-04-15 NOTE — Telephone Encounter (Signed)
I spoke with the patient regarding his ZIO monitor results as per Laurann Montana, NP.  I have advised him of Caitlin's recommendations to: 1) Add metoprolol succinate 25 mg at bedtime (& continue his metoprolol succinate 100 mg daily in the AM).   The patient voices understanding and is agreeable. He was scheduled for a follow up appointment on 10/14 with [facility], but has rescheduled this to 10/20 due to an endoscopy procedure that has been scheduled for 10/14.  He would like his RX to be updated at [facility].

## 2020-04-17 ENCOUNTER — Telehealth: Payer: Self-pay | Admitting: Gastroenterology

## 2020-04-17 NOTE — Telephone Encounter (Signed)
Ct has been scheduled. He would like to try for the [place], however he said when his PCP tried to order is was a cost to him over $700. He would like to schedule for the week of 04-22-2020 Nona Dell or Friday

## 2020-04-17 NOTE — Telephone Encounter (Signed)
Please give him this information.  His insurance company denied the CT scan because they have their rules to govern when they feel it is the appropriate test to get.  I may disagree, but they have the final decision.  As often occurs, they feel that an abdominal ultrasound should be done first and , only if that fails to diagnose the problem, might they then approve the CT scan.  I think that is not the most clinically appropriate and cost-effective approach , but again, those are his insurance company's rules.  So here are his choices:  1 - Have the abdominal ultrasound done and, if it fails to give a diagnosis, try to gt CT scan approval.  1 - Forego imaging altogether.  I will proceed however he chooses.

## 2020-04-17 NOTE — Telephone Encounter (Signed)
CT of the abdomen has been denied. Case# [id number] Phone: [phone] All records were faxed and reviewed.  74160 1 CT ABDOMEN; with contrast material Denied We did not receive results of an ultrasound (picture study using sound waves) that were limited or failed to diagnose (find the source of) your problem. You must have one of the following. -A history of a type of cancer known to spread to the abdomen. -A fever of at least 101 degrees or higher. -Elevated inflammatory markers. This would include a white blood count that is higher than the upper limit of normal range. -Your doctor felt a mass Based on [employer] Abdomen Imaging Guidelines Section(s): AB 2.5 Epigastric Pain and Dyspepsia, we cannot approve this request. Your records show that you have pain in the upper part of your belly (abdomen). The request cannot be approved because(growth) on physical exam. - Blood in your stool. - Moderate or severe tenderness noted on physical exam. -Peritoneal signs such as pain shown through guarding (pulling away) or rebound (pain that occurs when your doctor is releasing pressure after pushing down on your abdomen). The peritoneum is the lining of your organs and your abdominal cavity. - A suspected complication of weight loss surgery. -The ordering provider has noted that you have a surgical abdomen (A sudden problem with the abdomen that requires surgery to resolve)

## 2020-04-19 ENCOUNTER — Inpatient Hospital Stay: Admission: RE | Admit: 2020-04-19 | Payer: Managed Care, Other (non HMO) | Source: Ambulatory Visit

## 2020-04-22 ENCOUNTER — Telehealth: Payer: Self-pay | Admitting: Family

## 2020-04-22 NOTE — Telephone Encounter (Signed)
Rejection Reason - Patient Declined - PT CA APPT" [facility] said on Apr 22, 2020 3:50 PM

## 2020-04-23 ENCOUNTER — Encounter: Payer: Self-pay | Admitting: Gastroenterology

## 2020-04-24 ENCOUNTER — Other Ambulatory Visit: Payer: Self-pay

## 2020-04-24 ENCOUNTER — Encounter: Payer: Self-pay | Admitting: Family

## 2020-04-24 ENCOUNTER — Telehealth (INDEPENDENT_AMBULATORY_CARE_PROVIDER_SITE_OTHER): Payer: Managed Care, Other (non HMO) | Admitting: Family

## 2020-04-24 VITALS — BP 115/66 | HR 98 | Ht 68.5 in | Wt 255.8 lb

## 2020-04-24 DIAGNOSIS — I471 Supraventricular tachycardia: Secondary | ICD-10-CM | POA: Diagnosis not present

## 2020-04-24 DIAGNOSIS — Z794 Long term (current) use of insulin: Secondary | ICD-10-CM

## 2020-04-24 DIAGNOSIS — E119 Type 2 diabetes mellitus without complications: Secondary | ICD-10-CM | POA: Diagnosis not present

## 2020-04-24 DIAGNOSIS — I10 Essential (primary) hypertension: Secondary | ICD-10-CM | POA: Diagnosis not present

## 2020-04-24 MED ORDER — PEN NEEDLES 32G X 6 MM MISC: 10 refills | Status: AC

## 2020-04-24 MED ORDER — METFORMIN HCL ER 500 MG PO TB24: ORAL_TABLET | ORAL | 3 refills | Status: DC

## 2020-04-24 NOTE — Assessment & Plan Note (Signed)
Stable. Compliant metoprolol succinate. Following with cardiology

## 2020-04-24 NOTE — Assessment & Plan Note (Addendum)
Well controlled. Patient had an episode of dizziness. Since has been holding HCTZ. Advised appropriate to trial stop hctz and continue to monitor blood pressure. Continue amlodipine 10mg , telmisartan 40mg  qd.Close follow up

## 2020-04-24 NOTE — Patient Instructions (Addendum)
Call me with blood glucose readings.   Start metformin STOP hctz   Referral to nutrition  Let us know if you dont hear back within a week in regards to an appointment being scheduled.

## 2020-04-24 NOTE — Assessment & Plan Note (Signed)
Severely uncontrolled. Continue lantus 60 units, humalog 25 units with lunch and dinner. Start metformin xr 500mg  and increase to 1000 mg in one week, goal to maximize as patient brings in blood glucose logs. Referral to nutrition as we are no making improvement as would like and patient has increased basal, bolus insulin without change. Again advised that patient needs to see endocrine. He would prefer to make above changes prior to seeing endocrine. He will bring blood glucose weekly so I can make adjustments to regimen.

## 2020-04-24 NOTE — Progress Notes (Signed)
Virtual Visit via Video Note  I connected with@  on 04/24/20 at  9:00 AM EDT by a video enabled telemedicine application and verified that I am speaking with the correct person using two identifiers.  Location patient: home Location provider:work  Persons participating in the virtual visit: patient, provider  I discussed the limitations of evaluation and management by telemedicine and the availability of in person appointments. The patient expressed understanding and agreed to proceed.   HPI: Follow up DM CGM costs $1000 and has not been able to purchase.   lantus 60 units, humalog 25 units with lunch and dinner,  hasnt had to hold due to pre meal < 150. Doesn't eat breakfast due to nausea.  OFF of glipizide 10mg  bid.  Unable to tolerate metformin due to diarrhea in the past.  Has noted blood glucose is lower after vegetable dinner. Working on eating less carbs. Doesn't drink soda, sweet soda.   He shared all of his readings:  Fasting 224, 192,197,232,188, 220 Before dinner 173,160,377 Post prandial dinner 438  HTN- improved.  metoprolol succinate 25mg  at bedtome and continue 100mg  qam, telmisartan, hctz 25mg , amlodipine 10mg  . [place] amusement [place] 4 days [place], in which felt dizzy. He held hctz and bp at home 115/68.  No syncope. Not drinking enough water. He had since held HCTZ and dizziness improved.  No leg swelling, CP    Completed wearing Zio monitor, caitlin advised starting metoprolol succinate 25mg  at bedtome and continue 100mg  qam.  Continues to have bloating, nausea. Pending EGD, CT ad with Dr Loletha Carrow; last consult 04/10/20 ( note incomplete)  ROS: See pertinent positives and negatives per HPI.    EXAM:  VITALS per patient if applicable: BP 382/50   Pulse 98   Ht 5' 8.5" (1.74 m)   Wt 255 lb 12.8 oz (116 kg)   BMI 38.33 kg/m  BP Readings from Last 3 Encounters:  04/24/20 115/66  04/10/20 126/72  03/13/20 130/76   Wt Readings from Last 3 Encounters:   04/24/20 255 lb 12.8 oz (116 kg)  04/10/20 265 lb 2 oz (120.3 kg)  03/13/20 269 lb 14.4 oz (122.4 kg)    GENERAL: alert, oriented, appears well and in no acute distress  HEENT: atraumatic, conjunttiva clear, no obvious abnormalities on inspection of external nose and ears  NECK: normal movements of the head and neck  LUNGS: on inspection no signs of respiratory distress, breathing rate appears normal, no obvious gross SOB, gasping or wheezing  CV: no obvious cyanosis  MS: moves all visible extremities without noticeable abnormality  PSYCH/NEURO: pleasant and cooperative, no obvious depression or anxiety, speech and thought processing grossly intact  ASSESSMENT AND PLAN:  Discussed the following assessment and plan:  Problem List Items Addressed This Visit      Cardiovascular and Mediastinum   SVT (supraventricular tachycardia) (HCC)    Stable. Compliant metoprolol succinate. Following with cardiology       Uncontrolled hypertension    Well controlled. Patient had an episode of dizziness. Since has been holding HCTZ. Advised appropriate to trial stop hctz and continue to monitor blood pressure. Continue amlodipine 10mg , telmisartan 40mg  qd.Close follow up        Endocrine   Type 2 diabetes mellitus without complication, with long-term current use of insulin ([place]) - Primary    Severely uncontrolled. Continue lantus 60 units, humalog 25 units with lunch and dinner. Start metformin xr 500mg  and increase to 1000 mg in one week, goal to  maximize as patient brings in blood glucose logs. Referral to nutrition as we are no making improvement as would like and patient has increased basal, bolus insulin without change. Again advised that patient needs to see endocrine. He would prefer to make above changes prior to seeing endocrine. He will bring blood glucose weekly so I can make adjustments to regimen.       Relevant Medications   metFORMIN (GLUCOPHAGE XR) 500 MG 24 hr tablet    Other Relevant Orders   Referral to Nutrition and Diabetes Services      -we discussed possible serious and likely etiologies, options for evaluation and workup, limitations of telemedicine visit vs in person visit, treatment, treatment risks and precautions. Pt prefers to treat via telemedicine empirically rather then risking or undertaking an in person visit at this moment.  .   I discussed the assessment and treatment plan with the patient. The patient was provided an opportunity to ask questions and all were answered. The patient agreed with the plan and demonstrated an understanding of the instructions.   The patient was advised to call back or seek an in-person evaluation if the symptoms worsen or if the condition fails to improve as anticipated.   Mable Paris, FNP

## 2020-04-25 ENCOUNTER — Encounter: Payer: Self-pay | Admitting: Gastroenterology

## 2020-04-25 ENCOUNTER — Ambulatory Visit: Payer: Managed Care, Other (non HMO) | Admitting: Family

## 2020-04-25 ENCOUNTER — Other Ambulatory Visit: Payer: Self-pay

## 2020-04-25 ENCOUNTER — Other Ambulatory Visit: Payer: Managed Care, Other (non HMO)

## 2020-04-25 ENCOUNTER — Ambulatory Visit (AMBULATORY_SURGERY_CENTER): Payer: Managed Care, Other (non HMO) | Admitting: Gastroenterology

## 2020-04-25 VITALS — BP 133/53 | HR 90 | Temp 97.2°F | Resp 26 | Ht 68.0 in | Wt 265.0 lb

## 2020-04-25 DIAGNOSIS — K317 Polyp of stomach and duodenum: Secondary | ICD-10-CM | POA: Diagnosis present

## 2020-04-25 DIAGNOSIS — K2970 Gastritis, unspecified, without bleeding: Secondary | ICD-10-CM

## 2020-04-25 DIAGNOSIS — R1013 Epigastric pain: Secondary | ICD-10-CM

## 2020-04-25 DIAGNOSIS — R7989 Other specified abnormal findings of blood chemistry: Secondary | ICD-10-CM

## 2020-04-25 DIAGNOSIS — R1012 Left upper quadrant pain: Secondary | ICD-10-CM

## 2020-04-25 DIAGNOSIS — K319 Disease of stomach and duodenum, unspecified: Secondary | ICD-10-CM

## 2020-04-25 DIAGNOSIS — R1011 Right upper quadrant pain: Secondary | ICD-10-CM

## 2020-04-25 DIAGNOSIS — R945 Abnormal results of liver function studies: Secondary | ICD-10-CM

## 2020-04-25 MED ORDER — SODIUM CHLORIDE 0.9 % IV SOLN: 500.0000 mL | Freq: Once | INTRAVENOUS | Status: AC

## 2020-04-25 NOTE — Op Note (Signed)
[facility] Patient Name: Dominic [name] Procedure Date: 04/25/2020 8:47 AM MRN: [id number] Endoscopist: Mallie Mussel L. Loletha Carrow , MD Age: 49 Referring MD:  Date of Birth: [birth year] Gender: Male Account #: [id number] Procedure:                Upper GI endoscopy Indications:              Epigastric abdominal pain Medicines:                Monitored Anesthesia Care Procedure:                Pre-Anesthesia Assessment:                           - Prior to the procedure, a History and Physical                            was performed, and patient medications and                            allergies were reviewed. The patient's tolerance of                            previous anesthesia was also reviewed. The risks                            and benefits of the procedure and the sedation                            options and risks were discussed with the patient.                            All questions were answered, and informed consent                            was obtained. Prior Anticoagulants: The patient has                            taken no previous anticoagulant or antiplatelet                            agents. ASA Grade Assessment: II - A patient with                            mild systemic disease. After reviewing the risks                            and benefits, the patient was deemed in                            satisfactory condition to undergo the procedure.                           After obtaining informed consent, the endoscope was  passed under direct vision. Throughout the                            procedure, the patient's blood pressure, pulse, and                            oxygen saturations were monitored continuously. The                            Endoscope was introduced through the mouth, and                            advanced to the second part of duodenum. The upper                            GI endoscopy was accomplished  without difficulty.                            The patient tolerated the procedure well. Scope In: Scope Out: Findings:                 The larynx was normal.                           The esophagus was normal.                           Patchy mild inflammation characterized by adherent                            blood was found in the gastric fundus. Several                            biopsies were obtained in the gastric body and in                            the gastric antrum with cold forceps for histology.                            (one pathology jar)                           Multiple small sessile fundic gland polyps were                            found in the gastric fundus and in the gastric body.                           The exam of the stomach was otherwise normal,                            including on retroflexion.                           The examined duodenum was normal. Complications:  No immediate complications. Estimated Blood Loss:     Estimated blood loss was minimal. Impression:               - Normal larynx.                           - Normal esophagus.                           - Gastritis. Unclear if this explains patient's                            symptoms.                           - Multiple fundic gland polyps.                           - Normal examined duodenum.                           - Several biopsies were obtained in the gastric                            body and in the gastric antrum. Recommendation:           - Patient has a contact number available for                            emergencies. The signs and symptoms of potential                            delayed complications were discussed with the                            patient. Return to normal activities tomorrow.                            Written discharge instructions were provided to the                            patient.                           - Resume previous  diet.                           - Continue present medications.                           - Await pathology results.                           - Lab draw today for further workup of elevated                            alkaline phosphatase/GGT.  Ultrasound of abdomen will be scheduled. Sirinity Outland L. Loletha Carrow, MD 04/25/2020 9:14:08 AM This report has been signed electronically.

## 2020-04-25 NOTE — Telephone Encounter (Signed)
[place] has been scheduled for 04-30-2020 at [facility] radiology at 11am. Detailed information has been sent to patient via my-chart

## 2020-04-25 NOTE — Telephone Encounter (Signed)
Patient still needs abdominal ultrasound scheduled. I saw him for EGD today and he would like to proceed with [place] since insurance denied CT scan abdomen. Upper abdominal pain and elevated LFTs

## 2020-04-25 NOTE — Patient Instructions (Signed)
Discharge instructions given. Biopsies taken. Patient to Lab after discharge. Resume previous medications. YOU HAD AN ENDOSCOPIC PROCEDURE TODAY AT [facility]:   Refer to the procedure report that was given to you for any specific questions about what was found during the examination.  If the procedure report does not answer your questions, please call your gastroenterologist to clarify.  If you requested that your care partner not be given the details of your procedure findings, then the procedure report has been included in a sealed envelope for you to review at your convenience later.  YOU SHOULD EXPECT: Some feelings of bloating in the abdomen. Passage of more gas than usual.  Walking can help get rid of the air that was put into your GI tract during the procedure and reduce the bloating. If you had a lower endoscopy (such as a colonoscopy or flexible sigmoidoscopy) you may notice spotting of blood in your stool or on the toilet paper. If you underwent a bowel prep for your procedure, you may not have a normal bowel movement for a few days.  Please Note:  You might notice some irritation and congestion in your nose or some drainage.  This is from the oxygen used during your procedure.  There is no need for concern and it should clear up in a day or so.  SYMPTOMS TO REPORT IMMEDIATELY:   Following upper endoscopy (EGD)  Vomiting of blood or coffee ground material  New chest pain or pain under the shoulder blades  Painful or persistently difficult swallowing  New shortness of breath  Fever of 100F or higher  Black, tarry-looking stools  For urgent or emergent issues, a gastroenterologist can be reached at any hour by calling [phone]. Do not use MyChart messaging for urgent concerns.    DIET:  We do recommend a small meal at first, but then you may proceed to your regular diet.  Drink plenty of fluids but you should avoid alcoholic beverages for 24  hours.  ACTIVITY:  You should plan to take it easy for the rest of today and you should NOT DRIVE or use heavy machinery until tomorrow (because of the sedation medicines used during the test).    FOLLOW UP: Our staff will call the number listed on your records 48-72 hours following your procedure to check on you and address any questions or concerns that you may have regarding the information given to you following your procedure. If we do not reach you, we will leave a message.  We will attempt to reach you two times.  During this call, we will ask if you have developed any symptoms of COVID 19. If you develop any symptoms (ie: fever, flu-like symptoms, shortness of breath, cough etc.) before then, please call [phone].  If you test positive for Covid 19 in the 2 weeks post procedure, please call and report this information to [place].    If any biopsies were taken you will be contacted by phone or by letter within the next 1-3 weeks.  Please call us at [phone] if you have not heard about the biopsies in 3 weeks.    SIGNATURES/CONFIDENTIALITY: You and/or your care partner have signed paperwork which will be entered into your electronic medical record.  These signatures attest to the fact that that the information above on your After Visit Summary has been reviewed and is understood.  Full responsibility of the confidentiality of this discharge information lies with you and/or your care-partner.

## 2020-04-25 NOTE — Addendum Note (Signed)
Addended by: Jacob Moores on: 04/25/2020 09:41 AM   Modules accepted: Orders

## 2020-04-25 NOTE — Progress Notes (Signed)
0849 Robinul 0.1 mg IV given due large amount of secretions upon assessment.  MD made aware, vss  

## 2020-04-25 NOTE — Progress Notes (Signed)
Report given to PACU, vss 

## 2020-04-26 LAB — HEPATIC FUNCTION PANEL
ALT: 17 IU/L (ref 0–44)
AST: 18 IU/L (ref 0–40)
Albumin: 3.1 g/dL — ABNORMAL LOW (ref 4.0–5.0)
Alkaline Phosphatase: 215 IU/L — ABNORMAL HIGH (ref 44–121)
Bilirubin Total: 0.7 mg/dL (ref 0.0–1.2)
Bilirubin, Direct: 0.47 mg/dL — ABNORMAL HIGH (ref 0.00–0.40)
Total Protein: 6.5 g/dL (ref 6.0–8.5)

## 2020-04-26 LAB — MITOCHONDRIAL ANTIBODIES: Mitochondrial Ab: <20 Units (ref 0.0–20.0)

## 2020-04-26 LAB — ANA: Anti Nuclear Antibody (ANA): NEGATIVE

## 2020-04-26 LAB — IGG: IgG (Immunoglobin G), Serum: 1095 mg/dL (ref 603–1613)

## 2020-04-26 LAB — ANTI-SMOOTH MUSCLE ANTIBODY, IGG: Smooth Muscle Ab: 11 Units (ref 0–19)

## 2020-04-29 ENCOUNTER — Telehealth: Payer: Self-pay

## 2020-04-29 NOTE — Telephone Encounter (Signed)
°  Follow up Call-  Call back number 04/25/2020  Post procedure Call Back phone  # [phone]  Permission to leave phone message Yes     Patient questions:  Do you have a fever, pain , or abdominal swelling? No. Pain Score  0 *  Have you tolerated food without any problems? Yes.    Have you been able to return to your normal activities? Yes.    Do you have any questions about your discharge instructions: Diet   No. Medications  No. Follow up visit  No.  Do you have questions or concerns about your Care? No.  Actions: * If pain score is 4 or above: No action needed, pain <4.  1. Have you developed a fever since your procedure? no  2.   Have you had an respiratory symptoms (SOB or cough) since your procedure? no  3.   Have you tested positive for COVID 19 since your procedure no  4.   Have you had any family members/close contacts diagnosed with the COVID 19 since your procedure?  no   If yes to any of these questions please route to Joylene John, RN and Joella Prince, RN

## 2020-04-30 ENCOUNTER — Ambulatory Visit (HOSPITAL_COMMUNITY)
Admission: RE | Admit: 2020-04-30 | Discharge: 2020-04-30 | Disposition: A | Discharge status: Home / Self Care | Payer: Managed Care, Other (non HMO) | Source: Ambulatory Visit | Attending: Family | Admitting: Family

## 2020-04-30 ENCOUNTER — Other Ambulatory Visit: Payer: Self-pay

## 2020-04-30 DIAGNOSIS — R7989 Other specified abnormal findings of blood chemistry: Secondary | ICD-10-CM | POA: Diagnosis present

## 2020-05-01 ENCOUNTER — Telehealth: Payer: Self-pay

## 2020-05-01 ENCOUNTER — Other Ambulatory Visit: Payer: Self-pay | Admitting: Family

## 2020-05-01 ENCOUNTER — Other Ambulatory Visit (INDEPENDENT_AMBULATORY_CARE_PROVIDER_SITE_OTHER): Payer: Managed Care, Other (non HMO)

## 2020-05-01 ENCOUNTER — Ambulatory Visit: Payer: Managed Care, Other (non HMO) | Admitting: Family

## 2020-05-01 ENCOUNTER — Telehealth: Payer: Self-pay | Admitting: Family

## 2020-05-01 DIAGNOSIS — K769 Liver disease, unspecified: Secondary | ICD-10-CM | POA: Diagnosis not present

## 2020-05-01 NOTE — Telephone Encounter (Signed)
I was afraid of something like this, especially with the Alk Phos elevation.  The [place] report did not come to me - perhaps because you originally ordered it.    Yes, MRI liver with and without contrast would be the best study. After we see the results, the question will be primary [place] vs metastatic lesions.    Needs alpha-fetoprotein level as well.  Please let me know when study is done b/c report will not come to me after you order it. I am working the hospital consult service this week but available by message.  Thanks   - H. Danis

## 2020-05-01 NOTE — Telephone Encounter (Signed)
Dr Loletha Carrow,  Thanks for your quick response.   Will absolutely keep you updated on results of MRI liver and plan to schedule asap  I have ordered alpha fetoprotein and will have in come in this week; will share result with you as well   Judson Roch, please call pt and have him come in for a lab study today , tomorrow if he can.

## 2020-05-01 NOTE — Addendum Note (Signed)
Addended by: Leeanne Rio on: 05/01/2020 02:04 PM   Modules accepted: Orders

## 2020-05-01 NOTE — Telephone Encounter (Signed)
Patient called & scheduled for lab at 2:15p.

## 2020-05-01 NOTE — Telephone Encounter (Signed)
Dr Loletha Carrow, Patient ultrasound is concerning for liver mass, cirrhosis. I have ordered stat MRI liver w and without contrast. Patient aware.   I know you had ordered CT ab w constrast.  Any reason I should order CT ab  or you are okay with MRI Liver only?

## 2020-05-01 NOTE — Telephone Encounter (Signed)
Pt MRI was approved getting scheduled now. Pt wants tomorrow.

## 2020-05-01 NOTE — Telephone Encounter (Signed)
Called pt and is aware of liver cirrhosis and liver mass I have ordered liver mri stat.  Dominic [name], how soon can liver mri be scheduled?

## 2020-05-01 NOTE — Telephone Encounter (Signed)
[facility] is call to inform provider of the results of [place] URQ.  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 Further characterization with MRI without and with contrast recommended.

## 2020-05-01 NOTE — Telephone Encounter (Signed)
noted 

## 2020-05-02 ENCOUNTER — Ambulatory Visit
Admission: RE | Admit: 2020-05-02 | Discharge: 2020-05-02 | Disposition: A | Discharge status: Home / Self Care | Payer: Managed Care, Other (non HMO) | Source: Ambulatory Visit | Attending: Family | Admitting: Family

## 2020-05-02 ENCOUNTER — Encounter: Payer: Self-pay | Admitting: Internal Medicine

## 2020-05-02 ENCOUNTER — Other Ambulatory Visit: Payer: Self-pay | Admitting: Internal Medicine

## 2020-05-02 ENCOUNTER — Other Ambulatory Visit: Payer: Self-pay

## 2020-05-02 DIAGNOSIS — D490 Neoplasm of unspecified behavior of digestive system: Secondary | ICD-10-CM | POA: Insufficient documentation

## 2020-05-02 DIAGNOSIS — K769 Liver disease, unspecified: Secondary | ICD-10-CM | POA: Insufficient documentation

## 2020-05-02 LAB — AFP TUMOR MARKER: AFP, Serum, Tumor Marker: 1.7 ng/mL (ref 0.0–8.3)

## 2020-05-02 MED ORDER — GADOBUTROL 1 MMOL/ML IV SOLN
10.0000 mL | Freq: Once | INTRAVENOUS | Status: AC | PRN
  Administered 2020-05-02: 10 mL via INTRAVENOUS

## 2020-05-02 NOTE — Telephone Encounter (Signed)
err

## 2020-05-03 NOTE — Progress Notes (Signed)
Patient on schedule for Liver Biopsy 05/08/2020. Called and spoke with patient on phone. Made aware to be here @ 0730, NPO after [place] prior to procedure as well as have driver for discharge post procedure/recovery,holding am dose insulin, half dose hs prior to procedure. Stated understanding,.

## 2020-05-06 ENCOUNTER — Encounter: Payer: Self-pay | Admitting: Family

## 2020-05-07 ENCOUNTER — Other Ambulatory Visit: Payer: Self-pay | Admitting: Radiology

## 2020-05-07 ENCOUNTER — Ambulatory Visit: Payer: Managed Care, Other (non HMO) | Admitting: Pharmacist

## 2020-05-07 ENCOUNTER — Other Ambulatory Visit: Payer: Self-pay

## 2020-05-07 ENCOUNTER — Other Ambulatory Visit: Payer: Self-pay | Admitting: Family

## 2020-05-07 ENCOUNTER — Encounter: Payer: Self-pay | Admitting: Nurse Practitioner

## 2020-05-07 ENCOUNTER — Ambulatory Visit: Payer: Managed Care, Other (non HMO) | Admitting: Nurse Practitioner

## 2020-05-07 ENCOUNTER — Telehealth: Payer: Self-pay | Admitting: Family

## 2020-05-07 VITALS — BP 160/82 | HR 115 | Temp 98.4°F | Ht 68.0 in | Wt 248.0 lb

## 2020-05-07 DIAGNOSIS — N410 Acute prostatitis: Secondary | ICD-10-CM

## 2020-05-07 DIAGNOSIS — R300 Dysuria: Secondary | ICD-10-CM | POA: Diagnosis not present

## 2020-05-07 DIAGNOSIS — E119 Type 2 diabetes mellitus without complications: Secondary | ICD-10-CM

## 2020-05-07 DIAGNOSIS — R110 Nausea: Secondary | ICD-10-CM

## 2020-05-07 DIAGNOSIS — K769 Liver disease, unspecified: Secondary | ICD-10-CM | POA: Diagnosis not present

## 2020-05-07 DIAGNOSIS — Z794 Long term (current) use of insulin: Secondary | ICD-10-CM

## 2020-05-07 DIAGNOSIS — K8689 Other specified diseases of pancreas: Secondary | ICD-10-CM

## 2020-05-07 LAB — CBC WITH DIFFERENTIAL/PLATELET
Basophils Absolute: 0 10*3/uL (ref 0.0–0.2)
Basos: 1 %
EOS (ABSOLUTE): 0.1 10*3/uL (ref 0.0–0.4)
Eos: 1 %
Hematocrit: 38.7 % (ref 37.5–51.0)
Hemoglobin: 12.7 g/dL — ABNORMAL LOW (ref 13.0–17.7)
Immature Grans (Abs): 0 10*3/uL (ref 0.0–0.1)
Immature Granulocytes: 0 %
Lymphocytes Absolute: 1.5 10*3/uL (ref 0.7–3.1)
Lymphs: 17 %
MCH: 26.9 pg (ref 26.6–33.0)
MCHC: 32.8 g/dL (ref 31.5–35.7)
MCV: 82 fL (ref 79–97)
Monocytes Absolute: 0.6 10*3/uL (ref 0.1–0.9)
Monocytes: 7 %
Neutrophils Absolute: 6.4 10*3/uL (ref 1.4–7.0)
Neutrophils: 74 %
Platelets: 428 10*3/uL (ref 150–450)
RBC: 4.72 x10E6/uL (ref 4.14–5.80)
RDW: 13.6 % (ref 11.6–15.4)
WBC: 8.6 10*3/uL (ref 3.4–10.8)

## 2020-05-07 LAB — POCT URINALYSIS DIPSTICK
Bilirubin, UA: NEGATIVE
Blood, UA: NEGATIVE
Glucose, UA: NEGATIVE
Nitrite, UA: POSITIVE
Protein, UA: NEGATIVE
Spec Grav, UA: 1.02 (ref 1.010–1.025)
Urobilinogen, UA: 2 E.U./dL — AB
pH, UA: 6 (ref 5.0–8.0)

## 2020-05-07 LAB — BASIC METABOLIC PANEL
BUN/Creatinine Ratio: 14 (ref 9–20)
BUN: 12 mg/dL (ref 6–24)
CO2: 21 mmol/L (ref 20–29)
Calcium: 9.2 mg/dL (ref 8.7–10.2)
Chloride: 98 mmol/L (ref 96–106)
Creatinine, Ser: 0.86 mg/dL (ref 0.76–1.27)
GFR calc Af Amer: 117 mL/min/{1.73_m2} (ref >59)
GFR calc non Af Amer: 101 mL/min/{1.73_m2} (ref >59)
Glucose: 175 mg/dL — ABNORMAL HIGH (ref 65–99)
Potassium: 4.4 mmol/L (ref 3.5–5.2)
Sodium: 136 mmol/L (ref 134–144)

## 2020-05-07 MED ORDER — ONDANSETRON 4 MG PO TBDP: 4.0000 mg | ORAL_TABLET | Freq: Three times a day (TID) | ORAL | 2 refills | Status: DC | PRN

## 2020-05-07 MED ORDER — FREESTYLE LIBRE 2 SENSOR MISC: 11 refills | Status: DC

## 2020-05-07 MED ORDER — SULFAMETHOXAZOLE-TRIMETHOPRIM 800-160 MG PO TABS: 1.0000 | ORAL_TABLET | Freq: Two times a day (BID) | ORAL | 0 refills | Status: DC

## 2020-05-07 NOTE — Telephone Encounter (Signed)
Called to check in on patient Overall feeling well He has started bactrim as prescribed from [place] today. He still complains of pain in the rectal area.  No fever.  Continues to have nausea which has improved slightly. No vomiting Biopsy tomorrow Informed him of referral to oncology and I wil check in with him tomorrow.  Advised I would send in zofran to be used prn for nausea

## 2020-05-07 NOTE — Patient Instructions (Signed)
Visit Information  Goals Addressed              This Visit's Progress     Patient Stated   .  "I want to work on my blood sugars" (pt-stated)        [place] (see longtitudinal plan of care for additional care plan information)  Current Barriers:  . Social, financial, and community barriers: o Struggling recently. Imaging showed pancreatic and liver lesions. Biopsy tomorrow.  o Appt this morning w/ NP for suspected prostatitis. Reports that script was sent to the wrong [facility].  . Diabetes: uncontrolled; complicated by chronic medical conditions including HTN, SVT, overweight, most recent A1c 10.2% . Most recent eGFR: >60 ml/min . Current antihyperglycemic regimen: Lantus 50 units daily, Humalog 25 units TID w/ meals o Ozempic - severe GI upset (though wonder if related to pancreatic diagnosis) . 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 Current blood glucose readings: CGM was too expensive on insurance o Fasting: 105 this morning, but generally 160-180s o 2 hour after meals: Reports fluctuant readings, sometimes low and at goal, sometimes >200 . Cardiovascular risk reduction: sees cardiology. o Current hypertensive regimen: telmisartan 40 mg daily, metoprolol succinate 100 mg QAM, 25 mg QPM, HCTZ 25 mg QAM, amlodipine 10 mg QAM; SBP readings generally running in 130 o Current hyperlipidemia regimen: LDL significantly elevated at 170, but alk phos elevated on CMP- GI appt pending o Current antiplatelet regimen: ASA 81 mg daily  Pharmacist Clinical Goal(s):  Marland Kitchen Over the next 90 days, patient will work with PharmD and primary care provider to address   Interventions: . Comprehensive medication review performed, medication list updated in electronic medical record . Inter-disciplinary care team collaboration (see longitudinal plan of care) . Discussed w/ PCP. Restart metformin. Start w/ 1 tab of metformin XR w/ meal daily. Continue for at least a week, if tolerated, increase to BID.  Continue Lantus 50 units daily, Humalog up to 25 units TID with meals. Discussed that mteformin should allow for reduction in insulin.  . Discussed benefit of CGM, especially with upcoming pancreatic evaluation, in allowing for more complete monitoring of glucose. Discussed that cash price is $75 - patient amenable to this. Discussed w/ PCP; [facility] [place] [facility] sensors [facility]. Encouraged to [facility] and ask them to transfer script.   Patient Self Care Activities:  . Patient will scan glucose QID, document, and provide at future appointments . Patient will take medications as prescribed . Patient will contact provider with any episodes of hypoglycemia . Patient will report any questions or concerns to provider   Please see past updates related to this goal by clicking on the "Past Updates" button in the selected goal         The patient verbalized understanding of instructions provided today and declined a print copy of patient instruction materials.    Plan:  - Scheduled f/u call in ~ 3 weeks  Catie Darnelle Maffucci, PharmD, [place], [place] Pharmacist [place] [phone]

## 2020-05-07 NOTE — Chronic Care Management (AMB) (Signed)
Chronic Care Management   Follow Up Note   05/07/2020 Name: Dominic [name] MRN: [id number] DOB: 1970/12/01  Referred by: Burnard Hawthorne, FNP Reason for referral : Chronic Care Management (Medication Management)   Dominic [name] is a 49 y.o. year old male who is a primary care patient of Burnard Hawthorne, FNP. The CCM team was consulted for assistance with chronic disease management and care coordination needs.    Contacted patient for medication management review.   Review of patient status, including review of consultants reports, relevant laboratory and other test results, and collaboration with appropriate care team members and the patient's provider was performed as part of comprehensive patient evaluation and provision of chronic care management services.    SDOH (Social Determinants of Health) assessments performed: No See Care Plan activities for detailed interventions related to [facility])     Outpatient Encounter Medications as of 05/07/2020  Medication Sig Note  . hydrochlorothiazide (HYDRODIURIL) 25 MG tablet Take 25 mg by mouth daily.   Marland Kitchen amLODipine (NORVASC) 10 MG tablet Take 1 tablet (10 mg total) by mouth daily. 04/25/2020: This morning.  . Ascorbic Acid (VITAMIN C) 1000 MG tablet Take 1,000 mg by mouth daily.   . Continuous Blood Gluc Sensor (FREESTYLE LIBRE 2 SENSOR) MISC Scan glucose at least QID   . famotidine (PEPCID) 20 MG tablet Take 1 tablet (20 mg total) by mouth 2 (two) times daily.   Marland Kitchen glucose blood test strip 1 each by Other route as needed for other. Use to test blood sugar twice daily.   . insulin lispro (HUMALOG KWIKPEN) 100 UNIT/ML KwikPen Inject 10 units into the skin 10 minutes prior to lunch & dinner. Hold if glucose is less than 150.   Marland Kitchen Insulin Pen Needle (PEN NEEDLES) 32G X 6 MM MISC Used to give insulin injections 4 times daily.   . Lancets (ONETOUCH ULTRASOFT) lancets Used to check blood sugars twice daily.   Marland Kitchen LANTUS SOLOSTAR 100 UNIT/ML Solostar  Pen INJECT SUBCUTANEOUSLY 50  UNITS DAILY (Patient taking differently: Inject 60 Units into the skin. ) 03/22/2020: 50-60 units  . [name] Take 5 mg by mouth every evening.    . metFORMIN (GLUCOPHAGE XR) 500 MG 24 hr tablet Start 556m PO qpm for one week. Then the next week, take 10068mqhs.   . metoprolol succinate (TOPROL-XL) 100 MG 24 hr tablet TAKE 1 TABLET BY MOUTH  DAILY WITH OR IMMEDIATELY  FOLLOWING A MEAL   . metoprolol succinate (TOPROL-XL) 25 MG 24 hr tablet Take 1 tablet (25 mg) by mouth once daily at bedtime   . sulfamethoxazole-trimethoprim (BACTRIM DS) 800-160 MG tablet Take 1 tablet by mouth 2 (two) times daily.   . Marland Kitchenelmisartan (MICARDIS) 40 MG tablet Take 1 tablet (40 mg total) by mouth daily.   . Turmeric 500 MG CAPS Take by mouth.    Facility-Administered Encounter Medications as of 05/07/2020  Medication  . 0.9 %  sodium chloride infusion     Objective:   Goals Addressed              This Visit's Progress     Patient Stated   .  "I want to work on my blood sugars" (pt-stated)        [place] long[place]itudinal plan of care for additional care plan information)  Current Barriers:  . Social, financial, and community barriers: o Struggling recently. Imaging showed pancreatic and liver lesions. Biopsy tomorrow.  o Appt this morning w/ NP  for suspected prostatitis. Reports that [facility].  . Diabetes: uncontrolled; complicated by chronic medical conditions including HTN, SVT, overweight, most recent A1c 10.2% . Most recent eGFR: >60 ml/min . Current antihyperglycemic regimen: Lantus 50 units daily, Humalog 25 units TID w/ meals o Ozempic - severe GI upset (though wonder if related to pancreatic diagnosis) . 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 Current blood glucose readings: CGM was too expensive on insurance o Fasting: 105 this morning, but generally 160-180s o 2 hour after meals: Reports fluctuant readings, sometimes low and  at goal, sometimes >200 . Cardiovascular risk reduction: sees cardiology. o Current hypertensive regimen: telmisartan 40 mg daily, metoprolol succinate 100 mg QAM, 25 mg QPM, HCTZ 25 mg QAM, amlodipine 10 mg QAM; SBP readings generally running in 130 o Current hyperlipidemia regimen: LDL significantly elevated at 170, but alk phos elevated on CMP- GI appt pending o Current antiplatelet regimen: ASA 81 mg daily  Pharmacist Clinical Goal(s):  Marland Kitchen Over the next 90 days, patient will work with PharmD and primary care provider to address   Interventions: . Comprehensive medication review performed, medication list updated in electronic medical record . Inter-disciplinary care team collaboration (see longitudinal plan of care) . Discussed w/ PCP. Restart metformin. Start w/ 1 tab of metformin XR w/ meal daily. Continue for at least a week, if tolerated, increase to BID. Continue Lantus 50 units daily, Humalog up to 25 units TID with meals. Discussed that mteformin should allow for reduction in insulin.  . Discussed benefit of CGM, especially with upcoming pancreatic evaluation, in allowing for more complete monitoring of glucose. Discussed that cash price is $75 - patient amenable to this. Discussed w/ PCP; [facility] [place] [facility] sensors [facility]ouraged [facility] and ask [facility] to [facility] script.   Patient Self Care Activities:  . Patient will scan glucose QID, document, and provide at future appointments . Patient will take medications as prescribed . Patient will contact provider with any episodes of hypoglycemia . Patient will report any questions or concerns to provider   Please see past updates related to this goal by clicking on the "Past Updates" button in the selected goal          Plan:  - Scheduled f/u call in ~ 3 weeks  Catie Darnelle Maffucci, PharmD, [place], [place] Pharmacist [place] [phone]

## 2020-05-07 NOTE — Progress Notes (Signed)
Established Patient Office Visit  Subjective:  Patient ID: Dominic [name], male    DOB: [birth year]  Age: 49 y.o. MRN: [id number]  CC:  Chief Complaint  Patient presents with  . Acute Visit    groin pain    HPI Dominic [name] is a 49 yo who presents for groin pain in the testicles and prostate area x1 month duration that seems to be getting worse.  He does have a bilateral dull ache in the lower testicle region and in the rectal area.  This is a mild but constant uncomfortable pressure sensation.  He feels it is uncomfortable to sit at times.   He has not taken any medication for this. He reports a good stream, and feels like he is emptying his bladder but he does have dribbling after he voids.  He always has nocturia which is baseline for years.  He is also having urinary frequency,  but he has had elevated blood sugars recently and thinks it is related to the hyperglycemia. The patient denies any penile discharge.  He has had no fevers, chills, vomiting, has had a little bit of nausea.  He has also had a little bit of abdominal discomfort likely related to the pancreas and liver findings. He has a male partner of 3 years.  He has not been sexually active in over 1 year. He also reports he has had total impotence for at least 1 month. He notes that his testicles and penis seem to be smaller in size than normal.   He reports his bowel movements are regular, but did have a little diarrhea this weekend but thinks it was over stress.  He has seen some blood in the stool.  He is followed by Dr.Henry Danis in GI and reports there is discussion about eventual colonoscopy.  Patient is undergoing work-up for liver lesions and has a liver biopsy planned for tomorrow.  There is also a pancreatic mass.  Patient is expecting diagnosis of malignancy.  He reports he has been anxious over this.  Past Medical History:  Diagnosis Date  . Diabetes mellitus without complication ([place])   . GERD (gastroesophageal  reflux disease)   . High triglycerides   . Hypertension   . Long-term insulin use in type 2 diabetes ([place])   . Morbid obesity ([place])   . Psoriatic arthritis ([place])   . SVT (supraventricular tachycardia) ([facility])     No past surgical history on file.  Family History  Problem Relation Age of Onset  . Arthritis Mother   . Heart disease Mother   . Supraventricular tachycardia Mother        s/p ablation  . Diabetes Father   . Heart attack Maternal Uncle   . Throat cancer Maternal Uncle   . Esophageal cancer Maternal Uncle   . Heart attack Maternal Uncle   . Heart attack Maternal Uncle   . Prostate cancer Neg Hx   . Thyroid cancer Neg Hx   . Colon cancer Neg Hx   . Rectal cancer Neg Hx   . Stomach cancer Neg Hx     Social History   Socioeconomic History  . Marital status: Single    Spouse name: Not on file  . Number of children: Not on file  . Years of education: Not on file  . Highest education level: Not on file  Occupational History  . Not on file  Tobacco Use  . Smoking status: Never Smoker  . Smokeless tobacco: Never Used  Vaping Use  . Vaping Use: Never used  Substance and Sexual Activity  . Alcohol use: Yes    Alcohol/week: 1.0 standard drink    Types: 1 Glasses of wine per week    Comment: very occasional  . Drug use: Never  . Sexual activity: Not on file  Other Topics Concern  . Not on file  Social History Narrative   Lives in [place] w/ a roommate.  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  Does not routinely exercise.   Social Determinants of Health   Financial Resource Strain: Low Risk   . Difficulty of Paying Living Expenses: Not hard at all  Food Insecurity:   . Worried About Charity fundraiser in the Last Year: Not on file  . Ran Out of Food in the Last Year: Not on file  Transportation Needs:   . Lack of Transportation (Medical): Not on file  . Lack of Transportation (Non-Medical): Not on file  Physical Activity:   . Days of Exercise per Week: Not on file  .  Minutes of Exercise per Session: Not on file  Stress:   . Feeling of Stress : Not on file  Social Connections:   . Frequency of Communication with Friends and Family: Not on file  . Frequency of Social Gatherings with Friends and Family: Not on file  . Attends Religious Services: Not on file  . Active Member of Clubs or Organizations: Not on file  . Attends Archivist Meetings: Not on file  . Marital Status: Not on file  Intimate Partner Violence:   . Fear of Current or Ex-Partner: Not on file  . Emotionally Abused: Not on file  . Physically Abused: Not on file  . Sexually Abused: Not on file    Outpatient Medications Prior to Visit  Medication Sig Dispense Refill  . amLODipine (NORVASC) 10 MG tablet Take 1 tablet (10 mg total) by mouth daily. 90 tablet 2  . Ascorbic Acid (VITAMIN C) 1000 MG tablet Take 1,000 mg by mouth daily.    . famotidine (PEPCID) 20 MG tablet Take 1 tablet (20 mg total) by mouth 2 (two) times daily. 60 tablet 1  . glucose blood test strip 1 each by Other route as needed for other. Use to test blood sugar twice daily. 100 each 3  . insulin lispro (HUMALOG KWIKPEN) 100 UNIT/ML KwikPen Inject 10 units into the skin 10 minutes prior to lunch & dinner. Hold if glucose is less than 150. 15 mL 5  . Insulin Pen Needle (PEN NEEDLES) 32G X 6 MM MISC Used to give insulin injections 4 times daily. 100 each 10  . Lancets (ONETOUCH ULTRASOFT) lancets Used to check blood sugars twice daily. 100 each 12  . LANTUS SOLOSTAR 100 UNIT/ML Solostar Pen INJECT SUBCUTANEOUSLY 50  UNITS DAILY (Patient taking differently: Inject 60 Units into the skin. ) 45 mL 3  . [name] Take 5 mg by mouth every evening.     . metFORMIN (GLUCOPHAGE XR) 500 MG 24 hr tablet Start 500mg  PO qpm for one week. Then the next week, take 1000mg  qhs. 90 tablet 3  . metoprolol succinate (TOPROL-XL) 100 MG 24 hr tablet TAKE 1 TABLET BY MOUTH  DAILY WITH OR IMMEDIATELY  FOLLOWING A MEAL 90 tablet 3  .  metoprolol succinate (TOPROL-XL) 25 MG 24 hr tablet Take 1 tablet (25 mg) by mouth once daily at bedtime 90 tablet 3  . telmisartan (MICARDIS) 40 MG tablet Take 1 tablet (40  mg total) by mouth daily. 30 tablet 2  . Turmeric 500 MG CAPS Take by mouth.     Facility-Administered Medications Prior to Visit  Medication Dose Route Frequency Provider Last Rate Last Admin  . 0.9 %  sodium chloride infusion  500 mL Intravenous Once Nelida Meuse III, MD        No Known Allergies  Review of Systems   Pertinent positives noted in history of present illness and otherwise negative. Objective:    Physical Exam Vitals reviewed. Exam conducted with a chaperone present.  Constitutional:      Appearance: Normal appearance.  Cardiovascular:     Rate and Rhythm: Regular rhythm. Tachycardia present.     Pulses: Normal pulses.     Heart sounds: Normal heart sounds.  Pulmonary:     Effort: Pulmonary effort is normal.     Breath sounds: Normal breath sounds.  Abdominal:     Palpations: Abdomen is soft. There is mass.     Tenderness: There is no abdominal tenderness. There is no rebound.     Comments: RUQ abdomen   Genitourinary:    Penis: Normal and circumcised. No erythema, tenderness, swelling or lesions.      Testes: Normal.     Prostate: Tender.     Rectum: Guaiac result positive. No mass, anal fissure, external hemorrhoid or internal hemorrhoid.  Musculoskeletal:        General: Normal range of motion.  Skin:    General: Skin is warm and dry.  Neurological:     General: No focal deficit present.     Mental Status: He is alert and oriented to person, place, and time.  Psychiatric:        Behavior: Behavior normal.        Thought Content: Thought content normal.        Judgment: Judgment normal.     Comments: anxious     BP (!) 160/82 (BP Location: Left Arm, Patient Position: Sitting, Cuff Size: Normal)   Pulse (!) 115   Temp 98.4 F (36.9 C) (Oral)   Ht 5\' 8"  (1.727 m)   Wt 248 lb  (112.5 kg)   SpO2 97%   BMI 37.71 kg/m  Wt Readings from Last 3 Encounters:  05/07/20 248 lb (112.5 kg)  04/25/20 265 lb (120.2 kg)  04/24/20 255 lb 12.8 oz (116 kg)   Pulse Readings from Last 3 Encounters:  05/07/20 (!) 115  04/25/20 90  04/24/20 98    BP Readings from Last 3 Encounters:  05/07/20 (!) 160/82  04/25/20 (!) 133/53  04/24/20 115/66    Lab Results  Component Value Date   CHOL 226 (H) 03/07/2020   HDL 33 (L) 03/07/2020   LDLCALC 170 (H) 03/07/2020   TRIG 123 03/07/2020   CHOLHDL 6.8 (H) 03/07/2020      Health Maintenance Due  Topic Date Due  . Hepatitis C Screening  Never done  . PNEUMOCOCCAL POLYSACCHARIDE VACCINE AGE 67-64 HIGH RISK  Never done  . FOOT EXAM  Never done  . HIV Screening  Never done  . COLONOSCOPY  Never done    There are no preventive care reminders to display for this patient.  Lab Results  Component Value Date   TSH 0.861 07/19/2019   Lab Results  Component Value Date   WBC 8.6 05/07/2020   HGB 12.7 (L) 05/07/2020   HCT 38.7 05/07/2020   MCV 82 05/07/2020   PLT 428 05/07/2020   Lab Results  Component Value Date   NA 136 05/07/2020   K 4.4 05/07/2020   CO2 21 05/07/2020   GLUCOSE 175 (H) 05/07/2020   BUN 12 05/07/2020   CREATININE 0.86 05/07/2020   BILITOT 0.7 04/25/2020   ALKPHOS 215 (H) 04/25/2020   AST 18 04/25/2020   ALT 17 04/25/2020   PROT 6.5 04/25/2020   ALBUMIN 3.1 (L) 04/25/2020   CALCIUM 9.2 05/07/2020   ANIONGAP 10 10/18/2019   Lab Results  Component Value Date   CHOL 226 (H) 03/07/2020   Lab Results  Component Value Date   HDL 33 (L) 03/07/2020   Lab Results  Component Value Date   LDLCALC 170 (H) 03/07/2020   Lab Results  Component Value Date   TRIG 123 03/07/2020   Lab Results  Component Value Date   CHOLHDL 6.8 (H) 03/07/2020   Lab Results  Component Value Date   HGBA1C 10.2 (H) 03/07/2020      Assessment & Plan:   Problem List Items Addressed This Visit      Digestive    Liver disease   Relevant Orders   CBC with Differential/Platelet (Completed)   Basic metabolic panel (Completed)     Genitourinary   Acute prostatitis   Relevant Orders   Ambulatory referral to Urology     Other   Dysuria - Primary   Relevant Orders   POCT Urinalysis Dipstick (Completed)   Ambulatory referral to Urology   Urinalysis, Routine w reflex microscopic   Urine Culture      Meds ordered this encounter  Medications  . sulfamethoxazole-trimethoprim (BACTRIM DS) 800-160 MG tablet    Sig: Take 1 tablet by mouth 2 (two) times daily.    Dispense:  20 tablet    Refill:  0    Order Specific Question:   Supervising Provider    Answer:   Einar Pheasant [[id number]]   Please go to the lab today.   I have started you on Bactrim for likely  urinary tract infection and likely prostate infection. He was given 10 days- and may need to be extended to 14 days or longer in follow up.   I have placed an Urgent referral in to Urology to help you with this.   Continue to follow up with your primary care and GI providers as planned.   Follow-up: Return in about 1 week (around 05/14/2020).   This visit occurred during the SARS-CoV-2 public health emergency.  Safety protocols were in place, including screening questions prior to the visit, additional usage of staff PPE, and extensive cleaning of exam room while observing appropriate contact time as indicated for disinfecting solutions.   Denice Paradise, NP

## 2020-05-07 NOTE — Patient Instructions (Addendum)
Please go to the lab today.   I have started you on Bactrim for likely  urinary tract infection and likely prostate infection.   I have placed an Urgent referral in to Urology to help you with this.   Continue to follow up with your primary care and GI providers as planned.      Prostatitis  Prostatitis is swelling or inflammation of the prostate gland. The prostate is a walnut-sized gland that is involved in the production of semen. It is located below a man's bladder, in front of the rectum. There are four types of prostatitis:  Chronic nonbacterial prostatitis. This is the most common type of prostatitis. It may be associated with a viral infection or autoimmune disorder.  Acute bacterial prostatitis. This is the least common type of prostatitis. It starts quickly and is usually associated with a bladder infection, high fever, and shaking chills. It can occur at any age.  Chronic bacterial prostatitis. This type usually results from acute bacterial prostatitis that happens repeatedly (is recurrent) or has not been treated properly. It can occur in men of any age but is most common among middle-aged men whose prostate has begun to get larger. The symptoms are not as severe as symptoms caused by acute bacterial prostatitis.  Prostatodynia or chronic pelvic pain syndrome (CPPS). This type is also called pelvic floor disorder. It is associated with increased muscular tone in the pelvis surrounding the prostate. What are the causes? Bacterial prostatitis is caused by infection from bacteria. Chronic nonbacterial prostatitis may be caused by:  Urinary tract infections (UTIs).  Nerve damage.  A response by the body's disease-fighting system (autoimmune response).  Chemicals in the urine. The causes of the other types of prostatitis are usually not known. What are the signs or symptoms? Symptoms of this condition vary depending upon the type of prostatitis. If you have acute bacterial  prostatitis, you may experience:  Urinary symptoms, such as: ? Painful urination. ? Burning during urination. ? Frequent and sudden urges to urinate. ? Inability to start urinating. ? A weak or interrupted stream of urine.  Vomiting.  Nausea.  Fever.  Chills.  Inability to empty the bladder completely.  Pain in the: ? Muscles or joints. ? Lower back. ? Lower abdomen. If you have any of the other types of prostatitis, you may experience:  Urinary symptoms, such as: ? Sudden urges to urinate. ? Frequent urination. ? Difficulty starting urination. ? Weak urine stream. ? Dribbling after urination.  Discharge from the urethra. The urethra is a tube that opens at the end of the penis.  Pain in the: ? Testicles. ? Penis or tip of the penis. ? Rectum. ? Area in front of the rectum and below the scrotum (perineum).  Problems with sexual function.  Painful ejaculation.  Bloody semen. How is this diagnosed? This condition may be diagnosed based on:  A physical and medical exam.  Your symptoms.  A urine test to check for bacteria.  An exam in which a health care provider uses a finger to feel the prostate (digital rectal exam).  A test of a sample of semen.  Blood tests.  Ultrasound.  Removal of prostate tissue to be examined under a microscope (biopsy).  Tests to check how your body handles urine (urodynamic tests).  A test to look inside your bladder or urethra (cystoscopy). How is this treated? Treatment for this condition depends on the type of prostatitis. Treatment may involve:  Medicines to relieve pain or  inflammation.  Medicines to help relax your muscles.  Physical therapy.  Heat therapy.  Techniques to help you control certain body functions (biofeedback).  Relaxation exercises.  Antibiotic medicine, if your condition is caused by bacteria.  Warm water baths (sitz baths). Sitz baths help with relaxing your pelvic floor muscles, which  helps to relieve pressure on the prostate. Follow these instructions at home:   Take over-the-counter and prescription medicines only as told by your health care provider.  If you were prescribed an antibiotic, take it as told by your health care provider. Do not stop taking the antibiotic even if you start to feel better.  If physical therapy, biofeedback, or relaxation exercises were prescribed, do exercises as instructed.  Take sitz baths as directed by your health care provider. For a sitz bath, sit in warm water that is deep enough to cover your hips and buttocks.  Keep all follow-up visits as told by your health care provider. This is important. Contact a health care provider if:  Your symptoms get worse.  You have a fever. Get help right away if:  You have chills.  You feel nauseous.  You vomit.  You feel light-headed or feel like you are going to faint.  You are unable to urinate.  You have blood or blood clots in your urine. This information is not intended to replace advice given to you by your health care provider. Make sure you discuss any questions you have with your health care provider. Document Revised: 09/11/2017 Document Reviewed: 03/19/2016 Elsevier Patient Education  [place].  Dysuria Dysuria is pain or discomfort while urinating. The pain or discomfort may be felt in the part of your body that drains urine from the bladder (urethra) or in the surrounding tissue of the genitals. The pain may also be felt in the groin area, lower abdomen, or lower back. You may have to urinate frequently or have the sudden feeling that you have to urinate (urgency). Dysuria can affect both men and women, but it is more common in women. Dysuria can be caused by many different things, including:  Urinary tract infection.  Kidney stones or bladder stones.  Certain sexually transmitted infections (STIs), such as chlamydia.  Dehydration.  Inflammation of the  tissues of the vagina.  Use of certain medicines.  Use of certain soaps or scented products that cause irritation. Follow these instructions at home: General instructions  Watch your condition for any changes.  Urinate often. Avoid holding urine for long periods of time.  After a bowel movement or urination, women should cleanse from front to back, using each tissue only once.  Urinate after sexual intercourse.  Keep all follow-up visits as told by your health care provider. This is important.  If you had any tests done to find the cause of dysuria, it is up to you to get your test results. Ask your health care provider, or the department that is doing the test, when your results will be ready. Eating and drinking   Drink enough fluid to keep your urine pale yellow.  Avoid caffeine, tea, and alcohol. They can irritate the bladder and make dysuria worse. In men, alcohol may irritate the prostate. Medicines  Take over-the-counter and prescription medicines only as told by your health care provider.  If you were prescribed an antibiotic medicine, take it as told by your health care provider. Do not stop taking the antibiotic even if you start to feel better. Contact a health care provider  if:  You have a fever.  You develop pain in your back or sides.  You have nausea or vomiting.  You have blood in your urine.  You are not urinating as often as you usually do. Get help right away if:  Your pain is severe and not relieved with medicines.  You cannot eat or drink without vomiting.  You are confused.  You have a rapid heartbeat while at rest.  You have shaking or chills.  You feel extremely weak. Summary  Dysuria is pain or discomfort while urinating. Many different conditions can lead to dysuria.  If you have dysuria, you may have to urinate frequently or have the sudden feeling that you have to urinate (urgency).  Watch your condition for any changes. Keep all  follow-up visits as told by your health care provider.  Make sure that you urinate often and drink enough fluid to keep your urine pale yellow. This information is not intended to replace advice given to you by your health care provider. Make sure you discuss any questions you have with your health care provider. Document Revised: 06/11/2017 Document Reviewed: 04/15/2017 Elsevier Patient Education  [place].

## 2020-05-08 ENCOUNTER — Ambulatory Visit
Admission: RE | Admit: 2020-05-08 | Discharge: 2020-05-08 | Disposition: A | Discharge status: Home / Self Care | Payer: Managed Care, Other (non HMO) | Source: Ambulatory Visit | Attending: Internal Medicine | Admitting: Internal Medicine

## 2020-05-08 ENCOUNTER — Other Ambulatory Visit: Payer: Self-pay

## 2020-05-08 DIAGNOSIS — Z8616 Personal history of COVID-19: Secondary | ICD-10-CM | POA: Insufficient documentation

## 2020-05-08 DIAGNOSIS — C787 Secondary malignant neoplasm of liver and intrahepatic bile duct: Secondary | ICD-10-CM | POA: Diagnosis not present

## 2020-05-08 DIAGNOSIS — K769 Liver disease, unspecified: Secondary | ICD-10-CM | POA: Diagnosis present

## 2020-05-08 DIAGNOSIS — D490 Neoplasm of unspecified behavior of digestive system: Secondary | ICD-10-CM

## 2020-05-08 LAB — PROTIME-INR
INR: 1.1 (ref 0.8–1.2)
Prothrombin Time: 13.7 seconds (ref 11.4–15.2)

## 2020-05-08 LAB — URINALYSIS, ROUTINE W REFLEX MICROSCOPIC
Bilirubin, UA: NEGATIVE
Glucose, UA: NEGATIVE
Leukocytes,UA: NEGATIVE
Nitrite, UA: NEGATIVE
Protein,UA: NEGATIVE
RBC, UA: NEGATIVE
Specific Gravity, UA: 1.011 (ref 1.005–1.030)
Urobilinogen, Ur: 0.2 mg/dL (ref 0.2–1.0)
pH, UA: 5.5 (ref 5.0–7.5)

## 2020-05-08 LAB — CBC
HCT: 39 % (ref 39.0–52.0)
Hemoglobin: 12.7 g/dL — ABNORMAL LOW (ref 13.0–17.0)
MCH: 26.2 pg (ref 26.0–34.0)
MCHC: 32.6 g/dL (ref 30.0–36.0)
MCV: 80.6 fL (ref 80.0–100.0)
Platelets: 443 10*3/uL — ABNORMAL HIGH (ref 150–400)
RBC: 4.84 MIL/uL (ref 4.22–5.81)
RDW: 13.7 % (ref 11.5–15.5)
WBC: 9.5 10*3/uL (ref 4.0–10.5)
nRBC: 0 % (ref 0.0–0.2)

## 2020-05-08 LAB — APTT: aPTT: 36 seconds (ref 24–36)

## 2020-05-08 LAB — GLUCOSE, CAPILLARY: Glucose-Capillary: 128 mg/dL — ABNORMAL HIGH (ref 70–99)

## 2020-05-08 MED ORDER — FENTANYL CITRATE (PF) 100 MCG/2ML IJ SOLN
INTRAMUSCULAR | Status: AC
  Filled 2020-05-08: qty 2

## 2020-05-08 MED ORDER — FENTANYL CITRATE (PF) 100 MCG/2ML IJ SOLN
INTRAMUSCULAR | Status: AC | PRN
Start: 2020-05-08 — End: 2020-05-08
  Administered 2020-05-08 (×2): 50 ug via INTRAVENOUS

## 2020-05-08 MED ORDER — OXYCODONE HCL 5 MG PO TABS: 5.0000 mg | ORAL_TABLET | ORAL | Status: DC | PRN

## 2020-05-08 MED ORDER — SODIUM CHLORIDE 0.9 % IV SOLN: INTRAVENOUS | Status: DC

## 2020-05-08 MED ORDER — MIDAZOLAM HCL 2 MG/2ML IJ SOLN
INTRAMUSCULAR | Status: AC | PRN
  Administered 2020-05-08 (×2): 1 mg via INTRAVENOUS

## 2020-05-08 MED ORDER — MIDAZOLAM HCL 2 MG/2ML IJ SOLN
INTRAMUSCULAR | Status: AC
  Filled 2020-05-08: qty 2

## 2020-05-08 NOTE — Progress Notes (Signed)
Patient clinically stable post Liver Biopsy per DR Anselm Pancoast, tolerated well.vitals stable pre and post procedure. Received Versed 2 mg along with Fentanyl 100 mcg IV given for procedure. Report given to Fransico Michael Rn post procedure in specials with questions answered.

## 2020-05-08 NOTE — Consult Note (Signed)
Chief Complaint: Patient was seen in consultation today for ultrasound-guided liver biopsy at the request of DominicTeresa [name]  Referring Physician(s): DominicTeresa [name]   Patient Status: [facility] - Out-pt  History of Present Illness: Dominic [name] is a 49 y.o. male with elevated liver enzymes and underwent an abdominal ultrasound.  Ultrasound demonstrated multiple liver lesions.  Subsequently, patient had a liver MRI that demonstrated multiple lesions that are suspicious for metastatic disease.  There is also concern for a pancreatic lesion.  Patient needs a tissue diagnosis.  History of Covid infection approximately 10 months ago and the patient has also undergone vaccination.  Patient's main complaint today is abdominal pain that he attributes with a greasy meal.  He denies chest pain or shortness of breath.  No bowel issues.  He denies fevers or chills.  He has had poor appetite with weight loss.  Past Medical History:  Diagnosis Date  . Diabetes mellitus without complication ([place])   . GERD (gastroesophageal reflux disease)   . High triglycerides   . Hypertension   . Long-term insulin use in type 2 diabetes ([place])   . Morbid obesity ([place])   . Psoriatic arthritis ([place])   . SVT (supraventricular tachycardia) ([facility])     No past surgical history on file.  Allergies: Patient has no known allergies.  Medications: Prior to Admission medications   Medication Sig Start Date End Date Taking? Authorizing Provider  Ascorbic Acid (VITAMIN C) 1000 MG tablet Take 1,000 mg by mouth daily.   Yes [provider]  Continuous Blood Gluc Sensor (FREESTYLE LIBRE 2 SENSOR) MISC Scan glucose at least QID 05/07/20  Yes Arnett, Yvetta Coder, FNP  famotidine (PEPCID) 20 MG tablet Take 1 tablet (20 mg total) by mouth 2 (two) times daily. 03/07/20  Yes Arnett, Yvetta Coder, FNP  glucose blood test strip 1 each by Other route as needed for other. Use to test blood sugar twice daily. 07/21/19  Yes Arnett,  Yvetta Coder, FNP  hydrochlorothiazide (HYDRODIURIL) 25 MG tablet Take 25 mg by mouth daily.   Yes [provider]  insulin lispro (HUMALOG KWIKPEN) 100 UNIT/ML KwikPen Inject 10 units into the skin 10 minutes prior to lunch & dinner. Hold if glucose is less than 150. 03/15/20  Yes Arnett, Yvetta Coder, FNP  Insulin Pen Needle (PEN NEEDLES) 32G X 6 MM MISC Used to give insulin injections 4 times daily. 04/24/20  Yes Arnett, Yvetta Coder, FNP  Lancets [facility] ULTRASOFT) lancets Used to check blood sugars twice daily. 07/25/19  Yes Arnett, Yvetta Coder, FNP  [name] Take 5 mg by mouth every evening.    Yes [provider]  metFORMIN (GLUCOPHAGE XR) 500 MG 24 hr tablet Start 500mg  PO qpm for one week. Then the next week, take 1000mg  qhs. 04/24/20  Yes Arnett, Yvetta Coder, FNP  metoprolol succinate (TOPROL-XL) 100 MG 24 hr tablet TAKE 1 TABLET BY MOUTH  DAILY WITH OR IMMEDIATELY  FOLLOWING A MEAL 03/08/20  Yes Leone Haven, MD  metoprolol succinate (TOPROL-XL) 25 MG 24 hr tablet Take 1 tablet (25 mg) by mouth once daily at bedtime 04/15/20  Yes Loel Dubonnet, NP  ondansetron (ZOFRAN ODT) 4 MG disintegrating tablet Take 1 tablet (4 mg total) by mouth every 8 (eight) hours as needed for nausea or vomiting. 05/07/20  Yes Arnett, Yvetta Coder, FNP  sulfamethoxazole-trimethoprim (BACTRIM DS) 800-160 MG tablet Take 1 tablet by mouth 2 (two) times daily. 05/07/20  Yes Marval Regal, NP  telmisartan ([place])  40 MG tablet Take 1 tablet (40 mg total) by mouth daily. 03/13/20  Yes Loel Dubonnet, NP  Turmeric 500 MG CAPS Take by mouth.   Yes [provider]  amLODipine (NORVASC) 10 MG tablet Take 1 tablet (10 mg total) by mouth daily. 08/28/19 05/07/20  Loel Dubonnet, NP  LANTUS SOLOSTAR 100 UNIT/ML Solostar Pen INJECT SUBCUTANEOUSLY 50  UNITS DAILY Patient taking differently: Inject 60 Units into the skin.  10/04/19   Burnard Hawthorne, FNP     Family History  Problem  Relation Age of Onset  . Arthritis Mother   . Heart disease Mother   . Supraventricular tachycardia Mother        s/p ablation  . Diabetes Father   . Heart attack Maternal Uncle   . Throat cancer Maternal Uncle   . Esophageal cancer Maternal Uncle   . Heart attack Maternal Uncle   . Heart attack Maternal Uncle   . Prostate cancer Neg Hx   . Thyroid cancer Neg Hx   . Colon cancer Neg Hx   . Rectal cancer Neg Hx   . Stomach cancer Neg Hx     Social History   Socioeconomic History  . Marital status: Single    Spouse name: Not on file  . Number of children: Not on file  . Years of education: Not on file  . Highest education level: Not on file  Occupational History  . Not on file  Tobacco Use  . Smoking status: Never Smoker  . Smokeless tobacco: Never Used  Vaping Use  . Vaping Use: Never used  Substance and Sexual Activity  . Alcohol use: Yes    Alcohol/week: 1.0 standard drink    Types: 1 Glasses of wine per week    Comment: very occasional  . Drug use: Never  . Sexual activity: Not on file  Other Topics Concern  . Not on file  Social History Narrative   Lives in [place] w/ a roommate.  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  Does not routinely exercise.   Social Determinants of Health   Financial Resource Strain: Low Risk   . Difficulty of Paying Living Expenses: Not hard at all  Food Insecurity:   . Worried About Charity fundraiser in the Last Year: Not on file  . Ran Out of Food in the Last Year: Not on file  Transportation Needs:   . Lack of Transportation (Medical): Not on file  . Lack of Transportation (Non-Medical): Not on file  Physical Activity:   . Days of Exercise per Week: Not on file  . Minutes of Exercise per Session: Not on file  Stress:   . Feeling of Stress : Not on file  Social Connections:   . Frequency of Communication with Friends and Family: Not on file  . Frequency of Social Gatherings with Friends and Family: Not on file  . Attends Religious  Services: Not on file  . Active Member of Clubs or Organizations: Not on file  . Attends Archivist Meetings: Not on file  . Marital Status: Not on file     Review of Systems  Constitutional: Positive for appetite change and unexpected weight change. Negative for chills and fever.  Respiratory: Negative.   Cardiovascular: Negative.   Gastrointestinal: Positive for abdominal pain.    Vital Signs: BP (!) 143/83   Pulse (!) 108   Temp 97.7 F (36.5 C) (Oral)   Resp 20   Ht 5\' 9"  (  1.753 m)   Wt 112.5 kg   SpO2 96%   BMI 36.62 kg/m   Physical Exam Vitals reviewed.  Constitutional:      Appearance: Normal appearance.  Cardiovascular:     Rate and Rhythm: Normal rate and regular rhythm.     Heart sounds: Normal heart sounds.  Pulmonary:     Effort: Pulmonary effort is normal.     Breath sounds: Normal breath sounds.  Abdominal:     General: There is no distension.     Palpations: Abdomen is soft.     Tenderness: There is no abdominal tenderness.  Neurological:     Mental Status: He is alert.     Imaging: MR LIVER W WO CONTRAST  Result Date: 05/02/2020 CLINICAL DATA:  Evaluate liver lesions identified on recent ultrasound. EXAM: MRI ABDOMEN WITHOUT AND WITH CONTRAST TECHNIQUE: Multiplanar multisequence MR imaging of the abdomen was performed both before and after the administration of intravenous contrast. CONTRAST:  41mL GADAVIST GADOBUTROL 1 MMOL/ML IV SOLN COMPARISON:  Abdominal sonogram 04/30/2020 FINDINGS: Lower chest: No acute findings. Hepatobiliary: There are numerous heterogeneous lesions identified throughout the liver. Lesions demonstrate peripheral and heterogeneous enhancement with central low signal intensity. Smaller lesions have a target like appearance. -Index lesion within segment 5 measures 8.6 x 6.7 cm, image 62/15. -large lesion within segment 8 measures 9.9 x 8.8 cm, image 29/15. -Lesion within the left lobe of liver measures 8.2 x 5.2 cm,  image 38/15. The gallbladder appears within normal limits. No gallstones or signs of gallbladder wall inflammation. Common bile duct is normal in caliber. Pancreas: Subtle, solid-appearing enhancing lesion within tail of pancreas is noted measuring 1.9 x 1.5 cm, image 49/13. Distal to this lesion is mild parenchymal atrophy with mild main duct and branch duct dilatation, image 17/3. Spleen: The spleen measures 13.1 x 5.5 x 15.6 cm (volume = 590 cm^3), image 13/3. Small cystic structure along the subcapsular aspect of the posterior spleen measures 2.1 cm, image 13/3. Adrenals/Urinary Tract: Normal appearance of the adrenal glands. No hydronephrosis identified bilaterally. Small bilateral T2 hyperintense kidney lesions are identified. These are technically too small to reliably characterize. The largest is in the left mid kidney measuring up to 0.8 cm. No hydronephrosis identified bilaterally. Stomach/Bowel: Stomach appears within normal limits. No dilated loops of bowel identified within the imaged portions of the abdomen and pelvis. Vascular/Lymphatic: Porta hepatic lymph node has a short axis of 1.5 cm, image 24/5. Portacaval lymph node measures 1.4 cm, image 28/5. No abdominal aortic aneurysm demonstrated. Other:  None. Musculoskeletal: No suspicious bone lesions identified. IMPRESSION: 1. There are numerous heterogeneous enhancing lesions throughout the liver concerning for metastatic disease. These do not have the typical imaging features of hepatoma and may be amendable to percutaneous tissue sampling under ultrasound guidance. 2. There is a solid-appearing enhancing lesion within tail of pancreas measuring 1.9 x 1.5 cm. There is atrophy of the distal tail of pancreas with mild increase caliber of the main duct and intrahepatic ducts. This is suspicious for primary pancreatic neoplasm. Further evaluation with endoscopic ultrasound and tissue sampling is advised. 3. Mild adenopathy noted within the porta hepatic  region and porta hepatic region. 4. Mild splenomegaly. Electronically Signed   By: Kerby Moors M.D.   On: 05/02/2020 13:05   US ABDOMEN LIMITED RUQ  Result Date: 04/30/2020 CLINICAL DATA:  49 year old male with elevated LFTs. EXAM: ULTRASOUND ABDOMEN LIMITED RIGHT UPPER QUADRANT COMPARISON:  None. FINDINGS: Gallbladder: No gallstones or wall thickening visualized. No  sonographic Percell Miller sign noted by sonographer. Common bile duct: Diameter: 5 mm. Liver: The liver demonstrates a heterogeneous echogenicity with lobulated contour in keeping with cirrhosis. Multiple echogenic liver masses noted with the largest mass measuring up to 8.0 x 9.3 x 9.3 cm in the right lobe most consistent with malignancy. Further characterization with MRI without and with contrast is recommended. Portal vein is patent on color Doppler imaging with normal direction of blood flow towards the liver. Other: None. 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 Further characterization with MRI without and with contrast recommended. These results will be called to the ordering clinician or representative by the Radiologist Assistant, and communication documented in the PACS or [employer]. Electronically Signed   By: Anner Crete M.D.   On: 04/30/2020 22:52    Labs:  CBC: Recent Labs    07/19/19 1016 08/11/19 1007 05/07/20 0933 05/08/20 0748  WBC 6.1 8.5 8.6 9.5  HGB 17.8* 15.8 12.7* 12.7*  HCT 52.1* 45.8 38.7 39.0  PLT 181 173 428 443*    COAGS: Recent Labs    05/08/20 0748  INR 1.1  APTT 36    BMP: Recent Labs    08/11/19 1007 10/17/19 0928 10/18/19 1444 03/07/20 0902 04/10/20 1021 05/07/20 0933  NA  --  137 134* 135  --  136  K  --  5.7* 3.7 4.5  --  4.4  CL  --  96 99 94*  --  98  CO2  --  23 25 25   --  21  GLUCOSE  --  246* 377* 109*  --  175*  BUN   < > 14 16 13 18 12   CALCIUM  --  9.7 8.9 9.4  --  9.2  CREATININE   < > 0.93 0.93 0.79 0.91 0.86    GFRNONAA   < > 96 >60 105 98 101  GFRAA   < > 111 >60 121 113 117   < > = values in this interval not displayed.    LIVER FUNCTION TESTS: Recent Labs    07/19/19 1016 07/19/19 1016 10/17/19 0928 03/07/20 0902 03/13/20 1255 04/25/20 0942  BILITOT 0.7  --  0.6 0.4  --  0.7  AST 28  --  22 27  --  18  ALT 41  --  24 25  --  17  ALKPHOS 83   < > 85 179* 172* 215*  PROT 7.5  --  7.4 7.6  --  6.5  ALBUMIN 4.1  --  4.4 4.2  --  3.1*   < > = values in this interval not displayed.    TUMOR MARKERS: No results for input(s): AFPTM, CEA, CA199, CHROMGRNA in the last 8760 hours.  Assessment and Plan:  49 year old with multiple liver lesions that are suspicious for metastatic disease.  Patient needs a tissue diagnosis.  Patient is scheduled for an ultrasound-guided liver lesion biopsy with moderate sedation.  Risks and benefits of ultrasound-guided liver biopsy was discussed with the patient and/or patient's family including, but not limited to bleeding, infection, damage to adjacent structures or low yield requiring additional tests.  All of the questions were answered and there is agreement to proceed. Consent signed and in chart.   Thank you for this interesting consult.  I greatly enjoyed meeting Mickey Esguerra and look forward to participating in their care.  A copy of this report was sent to the requesting provider on this date.  Electronically Signed: Stephan Minister  Anselm Pancoast, MD 05/08/2020, 8:46 AM   I spent a total of  10 minutes   in face to face in clinical consultation, greater than 50% of which was counseling/coordinating care for ultrasound-guided liver biopsy

## 2020-05-08 NOTE — Procedures (Signed)
Interventional Radiology Procedure:   Indications: Liver lesions  Procedure: US guided liver lesion biopsy  Findings: Numerous liver lesions, core biopsies from left hepatic lesion.  Complications: None     EBL: Minimal, less than 5 ml  Plan: Bedrest 3 hours, then discharge to home.    Dominic [name] R. Anselm Pancoast, MD  Pager: [phone]

## 2020-05-08 NOTE — Progress Notes (Signed)
I have collaborated with the care management provider regarding care management and care coordination activities outlined in this encounter and have reviewed this encounter including documentation in the note and care plan. I am certifying that I agree with the content of this note and encounter as primary care provider.   Mable Paris, NP

## 2020-05-09 LAB — URINE CULTURE: Organism ID, Bacteria: NO GROWTH

## 2020-05-10 ENCOUNTER — Encounter: Payer: Self-pay | Admitting: Oncology

## 2020-05-10 ENCOUNTER — Telehealth: Payer: Self-pay | Admitting: Family

## 2020-05-10 ENCOUNTER — Inpatient Hospital Stay: Payer: Managed Care, Other (non HMO)

## 2020-05-10 ENCOUNTER — Encounter: Payer: Self-pay | Admitting: Family

## 2020-05-10 ENCOUNTER — Inpatient Hospital Stay: Payer: Managed Care, Other (non HMO) | Attending: Oncology | Admitting: Oncology

## 2020-05-10 ENCOUNTER — Other Ambulatory Visit: Payer: Self-pay | Admitting: Pathology

## 2020-05-10 ENCOUNTER — Other Ambulatory Visit: Payer: Self-pay

## 2020-05-10 VITALS — BP 137/78 | HR 97 | Temp 98.0°F | Resp 18 | Ht 69.0 in | Wt 246.0 lb

## 2020-05-10 DIAGNOSIS — R599 Enlarged lymph nodes, unspecified: Secondary | ICD-10-CM | POA: Diagnosis not present

## 2020-05-10 DIAGNOSIS — Z801 Family history of malignant neoplasm of trachea, bronchus and lung: Secondary | ICD-10-CM

## 2020-05-10 DIAGNOSIS — E119 Type 2 diabetes mellitus without complications: Secondary | ICD-10-CM | POA: Diagnosis not present

## 2020-05-10 DIAGNOSIS — K869 Disease of pancreas, unspecified: Secondary | ICD-10-CM | POA: Diagnosis present

## 2020-05-10 DIAGNOSIS — C19 Malignant neoplasm of rectosigmoid junction: Secondary | ICD-10-CM

## 2020-05-10 DIAGNOSIS — R55 Syncope and collapse: Secondary | ICD-10-CM

## 2020-05-10 DIAGNOSIS — Z8261 Family history of arthritis: Secondary | ICD-10-CM | POA: Insufficient documentation

## 2020-05-10 DIAGNOSIS — K769 Liver disease, unspecified: Secondary | ICD-10-CM | POA: Diagnosis present

## 2020-05-10 DIAGNOSIS — Z7189 Other specified counseling: Secondary | ICD-10-CM

## 2020-05-10 DIAGNOSIS — R160 Hepatomegaly, not elsewhere classified: Secondary | ICD-10-CM

## 2020-05-10 DIAGNOSIS — Z833 Family history of diabetes mellitus: Secondary | ICD-10-CM | POA: Diagnosis not present

## 2020-05-10 DIAGNOSIS — Z794 Long term (current) use of insulin: Secondary | ICD-10-CM

## 2020-05-10 DIAGNOSIS — L4050 Arthropathic psoriasis, unspecified: Secondary | ICD-10-CM | POA: Diagnosis not present

## 2020-05-10 DIAGNOSIS — I10 Essential (primary) hypertension: Secondary | ICD-10-CM | POA: Insufficient documentation

## 2020-05-10 DIAGNOSIS — Z8249 Family history of ischemic heart disease and other diseases of the circulatory system: Secondary | ICD-10-CM | POA: Diagnosis not present

## 2020-05-10 DIAGNOSIS — F419 Anxiety disorder, unspecified: Secondary | ICD-10-CM

## 2020-05-10 DIAGNOSIS — Z79899 Other long term (current) drug therapy: Secondary | ICD-10-CM

## 2020-05-10 DIAGNOSIS — K219 Gastro-esophageal reflux disease without esophagitis: Secondary | ICD-10-CM

## 2020-05-10 DIAGNOSIS — K8689 Other specified diseases of pancreas: Secondary | ICD-10-CM

## 2020-05-10 MED ORDER — ALPRAZOLAM 0.5 MG PO TABS: ORAL_TABLET | ORAL | 0 refills | Status: DC

## 2020-05-10 NOTE — Progress Notes (Signed)
Briefly introduced nurse navigator services and provided contact information for future needs. Follow up TBD by Dr. Tasia Catchings. Liver biopsy results pending.

## 2020-05-10 NOTE — Telephone Encounter (Signed)
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  He would like medication to take prn after work so he can sleep and help calm his nerves.  His mother and partner have been very supportative During OV with Dr Tasia Catchings he felt lightheaded when asked to move from chair to table , he started to feel sweaty and then had syncopal episode. He said his blood pressure went from 130 SBP to 80s SBP. Denies sob, cp, palpitations He is drinking plenty of water and blood sugar had been in 200s this morning. He thinks the HCTZ may have been contributory. He declines being seen in ED. He feels well now and back at work. He will monitor BP, HR at home this weekend and let me know if we should de-escalate BP regimen.   We discussed xanax for prn and he is aware that it is a controlled substance. He doesn't drink alcohol. We discussed potentially adjuncting with zoloft.  He will let me know how is feeling over the weekend with all.    I looked up patient on [place] Controlled Substances Reporting System PMP AWARE and saw no activity that raised concern of inappropriate use.

## 2020-05-10 NOTE — Progress Notes (Signed)
Patient here for initial consult  For mass of pancreas. Pt currently on antibiotics for prostatitis

## 2020-05-10 NOTE — H&P (View-Only) (Signed)
Hematology/Oncology Consult note [facility] [phone] [phone]   Patient Care Team: Burnard Hawthorne, FNP as PCP - General (Family Medicine) End, Harrell Gave, MD as PCP - Cardiology (Cardiology) De Hollingshead, RPH-CPP (Pharmacist) Clent Jacks, RN as Oncology Nurse Navigator  REFERRING PROVIDER: Burnard Hawthorne, FNP  CHIEF COMPLAINTS/REASON FOR VISIT:  Evaluation of liver and pancreatic mass.   HISTORY OF PRESENTING ILLNESS:   Dominic [name] is a  49 y.o.  male with PMH listed below was seen in consultation at the request of  Burnard Hawthorne, FNP  for evaluation of liver and pancreatic mass  #reports symptoms of upper abdomen band like discomfort and bloating for a few months, improved symptoms with omeprazole,.Chronic constipation, he has blood in the stool occationally Seen by PCP in September 2021. AlK phosphatase was recently noted to be elevated as well well elevated GGT. He was referred to see gastroenteralgias on 04/10/2020.  04/25/2020 EGD showed gastric fundus mild inflammation. Biopsy showed reactive gastropathy.  04/30/2020 [place] RUQ showed cirrhosis with multiple hepatic masses.  05/01/2020 AFP 1.7 05/02/2020 MR liver w wo contrast showed numerous heterozygous enhancing liver lesions, throughout the liver, not typical for [facility], likely metastatic disease.  Solid appearing enhancing lesion in pancreatic tail 1.9x1.5cm, suspicious for primary neoplasm.  Mild adenopathy within porta hepatic region and porta hepatic region. Splenomegaly.   Patient has history of SVT, psoriatic arthritis, morbid obesity, GERD, DM.  Family history + maternal uncle with esophageal cancer.   + unintentional weight loss, no fever, chills. "sweats a lot". Currently on antibiotics for proctitis.  He works at [employer], lives in [place]. He lives with his partner.  During today's visit, he felt dizziness and lightheaded when sitting on the  examination table.  He lost consciousness briefly for about 2-3 minutes, regain consciousness spontaneously, perfusing sweating. SBP in 90s, and HR initially in 30-40s, later improved to 70s.  He denies any chest pain, SOB. Blood sugar was 207.    Review of Systems  Constitutional: Positive for fatigue and unexpected weight change. Negative for appetite change, chills and fever.  HENT:   Negative for hearing loss and voice change.   Eyes: Negative for eye problems and icterus.  Respiratory: Negative for chest tightness, cough and shortness of breath.   Cardiovascular: Negative for chest pain and leg swelling.  Gastrointestinal: Negative for abdominal distention and abdominal pain.  Endocrine: Negative for hot flashes.  Genitourinary: Negative for difficulty urinating, dysuria and frequency.   Musculoskeletal: Negative for arthralgias.  Skin: Negative for itching and rash.  Neurological: Positive for light-headedness. Negative for numbness.  Hematological: Negative for adenopathy. Does not bruise/bleed easily.  Psychiatric/Behavioral: Negative for confusion.    MEDICAL HISTORY:  Past Medical History:  Diagnosis Date  . Diabetes mellitus without complication ([place])   . GERD (gastroesophageal reflux disease)   . High triglycerides   . Hypertension   . Long-term insulin use in type 2 diabetes ([place])   . Morbid obesity ([place])   . Psoriatic arthritis ([place])   . SVT (supraventricular tachycardia) ([place])     SURGICAL HISTORY: History reviewed. No pertinent surgical history.  SOCIAL HISTORY: Social History   Socioeconomic History  . Marital status: Single    Spouse name: Not on file  . Number of children: Not on file  . Years of education: Not on file  . Highest education level: Not on file  Occupational History  . Not on file  Tobacco Use  . Smoking status:  Never Smoker  . Smokeless tobacco: Never Used  Vaping Use  . Vaping Use: Never used  Substance and Sexual Activity  .  Alcohol use: Yes    Alcohol/week: 1.0 standard drink    Types: 1 Glasses of wine per week    Comment: very occasional  . Drug use: Never  . Sexual activity: Not on file  Other Topics Concern  . Not on file  Social History Narrative   Lives in [place] partner [place].  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     Social Determinants of Health   Financial Resource Strain: Low Risk   . Difficulty of Paying Living Expenses: Not hard at all  Food Insecurity:   . Worried About Charity fundraiser in the Last Year: Not on file  . Ran Out of Food in the Last Year: Not on file  Transportation Needs:   . Lack of Transportation (Medical): Not on file  . Lack of Transportation (Non-Medical): Not on file  Physical Activity:   . Days of Exercise per Week: Not on file  . Minutes of Exercise per Session: Not on file  Stress:   . Feeling of Stress : Not on file  Social Connections:   . Frequency of Communication with Friends and Family: Not on file  . Frequency of Social Gatherings with Friends and Family: Not on file  . Attends Religious Services: Not on file  . Active Member of Clubs or Organizations: Not on file  . Attends Archivist Meetings: Not on file  . Marital Status: Not on file  Intimate Partner Violence:   . Fear of Current or Ex-Partner: Not on file  . Emotionally Abused: Not on file  . Physically Abused: Not on file  . Sexually Abused: Not on file    FAMILY HISTORY: Family History  Problem Relation Age of Onset  . Arthritis Mother   . Heart disease Mother   . Supraventricular tachycardia Mother        s/p ablation  . Hypertension Mother   . Diabetes Father   . Heart attack Maternal Uncle   . Throat cancer Maternal Uncle   . Esophageal cancer Maternal Uncle   . Heart attack Maternal Uncle   . Heart attack Maternal Uncle   . Prostate cancer Neg Hx   . Thyroid cancer Neg Hx   . Colon cancer Neg Hx   . Rectal cancer Neg Hx   . Stomach cancer Neg Hx     ALLERGIES:  has No  Known Allergies.  MEDICATIONS:  Current Outpatient Medications  Medication Sig Dispense Refill  . amLODipine (NORVASC) 10 MG tablet Take 1 tablet (10 mg total) by mouth daily. 90 tablet 2  . Ascorbic Acid (VITAMIN C) 1000 MG tablet Take 1,000 mg by mouth daily.    . Continuous Blood Gluc Sensor (FREESTYLE LIBRE 2 SENSOR) MISC Scan glucose at least QID 2 each 11  . famotidine (PEPCID) 20 MG tablet Take 1 tablet (20 mg total) by mouth 2 (two) times daily. 60 tablet 1  . glucose blood test strip 1 each by Other route as needed for other. Use to test blood sugar twice daily. 100 each 3  . hydrochlorothiazide (HYDRODIURIL) 25 MG tablet Take 25 mg by mouth daily.    . insulin lispro (HUMALOG KWIKPEN) 100 UNIT/ML KwikPen Inject 10 units into the skin 10 minutes prior to lunch & dinner. Hold if glucose is less than 150. (Patient taking differently: 25 Units. Inject 10 units  into the skin 10 minutes prior to lunch & dinner. Hold if glucose is less than 150.) 15 mL 5  . Insulin Pen Needle (PEN NEEDLES) 32G X 6 MM MISC Used to give insulin injections 4 times daily. 100 each 10  . Lancets (ONETOUCH ULTRASOFT) lancets Used to check blood sugars twice daily. 100 each 12  . LANTUS SOLOSTAR 100 UNIT/ML Solostar Pen INJECT SUBCUTANEOUSLY 50  UNITS DAILY (Patient taking differently: Inject 60 Units into the skin. ) 45 mL 3  . [name] Take 5 mg by mouth every evening.     . metoprolol succinate (TOPROL-XL) 100 MG 24 hr tablet TAKE 1 TABLET BY MOUTH  DAILY WITH OR IMMEDIATELY  FOLLOWING A MEAL 90 tablet 3  . sulfamethoxazole-trimethoprim (BACTRIM DS) 800-160 MG tablet Take 1 tablet by mouth 2 (two) times daily. 20 tablet 0  . telmisartan (MICARDIS) 40 MG tablet Take 1 tablet (40 mg total) by mouth daily. 30 tablet 2  . Turmeric 500 MG CAPS Take by mouth.    . ALPRAZolam (XANAX) 0.5 MG tablet Take half tablet to one tablet by mouth as needed daily for insomnia, anxiety. 30 tablet 0  . metFORMIN (GLUCOPHAGE XR)  500 MG 24 hr tablet Start 584m PO qpm for one week. Then the next week, take 10053mqhs. (Patient not taking: Reported on 05/10/2020) 90 tablet 3  . metoprolol succinate (TOPROL-XL) 25 MG 24 hr tablet Take 1 tablet (25 mg) by mouth once daily at bedtime (Patient not taking: Reported on 05/10/2020) 90 tablet 3  . ondansetron (ZOFRAN ODT) 4 MG disintegrating tablet Take 1 tablet (4 mg total) by mouth every 8 (eight) hours as needed for nausea or vomiting. (Patient not taking: Reported on 05/10/2020) 30 tablet 2   Current Facility-Administered Medications  Medication Dose Route Frequency Provider Last Rate Last Admin  . 0.9 %  sodium chloride infusion  500 mL Intravenous Once DaNelida MeuseII, MD         PHYSICAL EXAMINATION: ECOG PERFORMANCE STATUS: 1 - Symptomatic but completely ambulatory Vitals:   05/10/20 0901  BP: 137/78  Pulse: 97  Resp: 18  Temp: 98 F (36.7 C)   Filed Weights   05/10/20 0901  Weight: 246 lb (111.6 kg)    Physical Exam Constitutional:      General: He is not in acute distress. HENT:     Head: Normocephalic and atraumatic.  Eyes:     General: No scleral icterus. Cardiovascular:     Rate and Rhythm: Normal rate and regular rhythm.     Heart sounds: Normal heart sounds.  Pulmonary:     Effort: Pulmonary effort is normal. No respiratory distress.     Breath sounds: No wheezing.  Abdominal:     General: Bowel sounds are normal. There is no distension.     Palpations: Abdomen is soft.  Musculoskeletal:        General: No deformity. Normal range of motion.     Cervical back: Normal range of motion and neck supple.  Skin:    General: Skin is warm and dry.     Findings: No erythema or rash.  Neurological:     Mental Status: He is alert and oriented to person, place, and time. Mental status is at baseline.     Cranial Nerves: No cranial nerve deficit.     Coordination: Coordination normal.  Psychiatric:        Mood and Affect: Mood normal.      LABORATORY  DATA:  I have reviewed the data as listed Lab Results  Component Value Date   WBC 9.5 05/08/2020   HGB 12.7 (L) 05/08/2020   HCT 39.0 05/08/2020   MCV 80.6 05/08/2020   PLT 443 (H) 05/08/2020   Recent Labs    08/11/19 1007 10/17/19 0928 10/17/19 0928 10/18/19 1444 03/07/20 0902 03/13/20 1255 04/10/20 1021 04/25/20 0942 05/07/20 0933  NA   < > 137  --  134* 135  --   --   --  136  K   < > 5.7*  --  3.7 4.5  --   --   --  4.4  CL   < > 96  --  99 94*  --   --   --  98  CO2   < > 23  --  25 25  --   --   --  21  GLUCOSE   < > 246*  --  377* 109*  --   --   --  175*  BUN   < > 14   < > 16 13  --  18  --  12  CREATININE   < > 0.93   < > 0.93 0.79  --  0.91  --  0.86  CALCIUM   < > 9.7  --  8.9 9.4  --   --   --  9.2  GFRNONAA   < > 96   < > >60 105  --  98  --  101  GFRAA   < > 111   < > >60 121  --  113  --  117  PROT  --  7.4  --   --  7.6  --   --  6.5  --   ALBUMIN  --  4.4  --   --  4.2  --   --  3.1*  --   AST  --  22  --   --  27  --   --  18  --   ALT  --  24  --   --  25  --   --  17  --   ALKPHOS  --  85   < >  --  179* 172*  --  215*  --   BILITOT  --  0.6  --   --  0.4  --   --  0.7  --   BILIDIR  --   --   --   --   --   --   --  0.47*  --    < > = values in this interval not displayed.   Iron/TIBC/Ferritin/ %Sat No results found for: IRON, TIBC, FERRITIN, IRONPCTSAT    RADIOGRAPHIC STUDIES: I have personally reviewed the radiological images as listed and agreed with the findings in the report. MR LIVER W WO CONTRAST  Result Date: 05/02/2020 CLINICAL DATA:  Evaluate liver lesions identified on recent ultrasound. EXAM: MRI ABDOMEN WITHOUT AND WITH CONTRAST TECHNIQUE: Multiplanar multisequence MR imaging of the abdomen was performed both before and after the administration of intravenous contrast. CONTRAST:  70m GADAVIST GADOBUTROL 1 MMOL/ML IV SOLN COMPARISON:  Abdominal sonogram 04/30/2020 FINDINGS: Lower chest: No acute findings.  Hepatobiliary: There are numerous heterogeneous lesions identified throughout the liver. Lesions demonstrate peripheral and heterogeneous enhancement with central low signal intensity. Smaller lesions have a target like appearance. -Index lesion within segment 5 measures 8.6 x 6.7 cm, image 62/15. -large lesion  within segment 8 measures 9.9 x 8.8 cm, image 29/15. -Lesion within the left lobe of liver measures 8.2 x 5.2 cm, image 38/15. The gallbladder appears within normal limits. No gallstones or signs of gallbladder wall inflammation. Common bile duct is normal in caliber. Pancreas: Subtle, solid-appearing enhancing lesion within tail of pancreas is noted measuring 1.9 x 1.5 cm, image 49/13. Distal to this lesion is mild parenchymal atrophy with mild main duct and branch duct dilatation, image 17/3. Spleen: The spleen measures 13.1 x 5.5 x 15.6 cm (volume = 590 cm^3), image 13/3. Small cystic structure along the subcapsular aspect of the posterior spleen measures 2.1 cm, image 13/3. Adrenals/Urinary Tract: Normal appearance of the adrenal glands. No hydronephrosis identified bilaterally. Small bilateral T2 hyperintense kidney lesions are identified. These are technically too small to reliably characterize. The largest is in the left mid kidney measuring up to 0.8 cm. No hydronephrosis identified bilaterally. Stomach/Bowel: Stomach appears within normal limits. No dilated loops of bowel identified within the imaged portions of the abdomen and pelvis. Vascular/Lymphatic: Porta hepatic lymph node has a short axis of 1.5 cm, image 24/5. Portacaval lymph node measures 1.4 cm, image 28/5. No abdominal aortic aneurysm demonstrated. Other:  None. Musculoskeletal: No suspicious bone lesions identified. IMPRESSION: 1. There are numerous heterogeneous enhancing lesions throughout the liver concerning for metastatic disease. These do not have the typical imaging features of hepatoma and may be amendable to percutaneous tissue  sampling under ultrasound guidance. 2. There is a solid-appearing enhancing lesion within tail of pancreas measuring 1.9 x 1.5 cm. There is atrophy of the distal tail of pancreas with mild increase caliber of the main duct and intrahepatic ducts. This is suspicious for primary pancreatic neoplasm. Further evaluation with endoscopic ultrasound and tissue sampling is advised. 3. Mild adenopathy noted within the porta hepatic region and porta hepatic region. 4. Mild splenomegaly. Electronically Signed   By: Kerby Moors M.D.   On: 05/02/2020 13:05   US BIOPSY (LIVER)  Result Date: 05/08/2020 INDICATION: 49 year old with multiple liver lesions and a suspicious pancreatic lesion. Findings are concerning for metastatic disease and patient needs a tissue diagnosis. EXAM: ULTRASOUND-GUIDED LIVER LESION BIOPSY MEDICATIONS: None. ANESTHESIA/SEDATION: Moderate (conscious) sedation was employed during this procedure. A total of Versed 2.0 mg and Fentanyl 100 mcg was administered intravenously. Moderate Sedation Time: 17 minutes. The patient's level of consciousness and vital signs were monitored continuously by radiology nursing throughout the procedure under my direct supervision. FLUOROSCOPY TIME:  None COMPLICATIONS: None immediate. PROCEDURE: Informed written consent was obtained from the patient after a thorough discussion of the procedural risks, benefits and alternatives. All questions were addressed. A timeout was performed prior to the initiation of the procedure. Liver was evaluated with ultrasound. Multiple liver lesions were identified. Lesion in the left hepatic lobe was targeted for biopsy. The anterior abdomen was prepped with chlorhexidine and sterile field was created. Skin and soft tissues were anesthetized using 1% lidocaine. A small incision was made. Using ultrasound guidance, 17 gauge coaxial needle was directed into the left hepatic lobe and a hepatic lesion. Needle position was confirmed within  the lesion. Total of 4 core biopsies were obtained using an 18 gauge core device. Specimens placed in formalin. Needle was removed without complication. Bandage placed over the puncture site. FINDINGS: Numerous heterogeneous liver lesions. Majority of the lesions are slightly hyperechoic. Needle placement confirmed within a left hepatic lesion. Adequate core specimens obtained. No immediate bleeding or hematoma formation. IMPRESSION: Ultrasound-guided core biopsy of a  hepatic lesion. Electronically Signed   By: Markus Daft M.D.   On: 05/08/2020 10:20   US ABDOMEN LIMITED RUQ  Result Date: 04/30/2020 CLINICAL DATA:  49 year old male with elevated LFTs. EXAM: ULTRASOUND ABDOMEN LIMITED RIGHT UPPER QUADRANT COMPARISON:  None. FINDINGS: Gallbladder: No gallstones or wall thickening visualized. No sonographic Murphy sign noted by sonographer. Common bile duct: Diameter: 5 mm. Liver: The liver demonstrates a heterogeneous echogenicity with lobulated contour in keeping with cirrhosis. Multiple echogenic liver masses noted with the largest mass measuring up to 8.0 x 9.3 x 9.3 cm in the right lobe most consistent with malignancy. Further characterization with MRI without and with contrast is recommended. Portal vein is patent on color Doppler imaging with normal direction of blood flow towards the liver. Other: None. IMPRESSION: Cirrhosis with multiple hepatic masses consistent with malignancy ([place]). Further characterization with MRI without and with contrast recommended. These results will be called to the ordering clinician or representative by the Radiologist Assistant, and communication documented in the PACS or [employer]. Electronically Signed   By: Anner Crete M.D.   On: 04/30/2020 22:52      ASSESSMENT & PLAN:  1. Metastatic colorectal cancer ([place])   2. Pancreatic mass   3. Goals of care, counseling/discussion   4. Syncope, unspecified syncope type    # Images were independently reviewed by  me and discussed with patient.  US liver biopsy showed metastatic adenocarcinoma, favors colorectal primary.  Check CEA  Patient will need to have colonoscopy for further evaluation.  Discussed about systemic chemotherapy.  Goals of care is with palliative intent.   # Pancreatic mass, maybe a second primary process. Check CA 19.9. Patient prefers the lab to be done at [facility]. Rx was provded.   # Syncope Lost consciousness briefly in the clinic,bradycardic, consistent with syncope event.  Symptoms improved and bradycardia resolved.  I advise him to go to ER for further evaluation and management. Patient admantly declined as he has felt much better.    All questions were answered. The patient knows to call the clinic with any problems questions or concerns.  cc Burnard Hawthorne, FNP    Return of visit: to be determined.  Thank you for this kind referral and the opportunity to participate in the care of this patient. A copy of today's note is routed to referring provider    Earlie Server, MD, PhD Hematology Oncology [facility] at [facility] Pager- [phone] 05/10/2020

## 2020-05-10 NOTE — H&P (View-Only) (Signed)
Hematology/Oncology Consult note [facility] [phone] [phone]   Patient Care Team: Arnett, Margaret G, FNP as PCP - General (Family Medicine) End, Christopher, MD as PCP - Cardiology (Cardiology) Travis, Catherine E, RPH-CPP (Pharmacist) Stanton, Kristi D, RN as Oncology Nurse Navigator  REFERRING PROVIDER: Arnett, Margaret G, FNP  CHIEF COMPLAINTS/REASON FOR VISIT:  Evaluation of liver and pancreatic mass.   HISTORY OF PRESENTING ILLNESS:   Dominic [name] is a  49 y.o.  male with PMH listed below was seen in consultation at the request of  Arnett, Margaret G, FNP  for evaluation of liver and pancreatic mass  #reports symptoms of upper abdomen band like discomfort and bloating for a few months, improved symptoms with omeprazole,.Chronic constipation, he has blood in the stool occationally Seen by PCP in September 2021. AlK phosphatase was recently noted to be elevated as well well elevated GGT. He was referred to see gastroenteralgias on 04/10/2020.  04/25/2020 EGD showed gastric fundus mild inflammation. Biopsy showed reactive gastropathy.  04/30/2020 US RUQ showed cirrhosis with multiple hepatic masses.  05/01/2020 AFP 1.7 05/02/2020 MR liver w wo contrast showed numerous heterozygous enhancing liver lesions, throughout the liver, not typical for HCC, likely metastatic disease.  Solid appearing enhancing lesion in pancreatic tail 1.9x1.5cm, suspicious for primary neoplasm.  Mild adenopathy within porta hepatic region and porta hepatic region. Splenomegaly.   Patient has history of SVT, psoriatic arthritis, morbid obesity, GERD, DM.  Family history + maternal uncle with esophageal cancer.   + unintentional weight loss, no fever, chills. "sweats a lot". Currently on antibiotics for proctitis.  He works at [employer], lives in . He lives with his partner.  During today's visit, he felt dizziness and lightheaded when sitting on the  examination table.  He lost consciousness briefly for about 2-3 minutes, regain consciousness spontaneously, perfusing sweating. SBP in 90s, and HR initially in 30-40s, later improved to 70s.  He denies any chest pain, SOB. Blood sugar was 207.    Review of Systems  Constitutional: Positive for fatigue and unexpected weight change. Negative for appetite change, chills and fever.  HENT:   Negative for hearing loss and voice change.   Eyes: Negative for eye problems and icterus.  Respiratory: Negative for chest tightness, cough and shortness of breath.   Cardiovascular: Negative for chest pain and leg swelling.  Gastrointestinal: Negative for abdominal distention and abdominal pain.  Endocrine: Negative for hot flashes.  Genitourinary: Negative for difficulty urinating, dysuria and frequency.   Musculoskeletal: Negative for arthralgias.  Skin: Negative for itching and rash.  Neurological: Positive for light-headedness. Negative for numbness.  Hematological: Negative for adenopathy. Does not bruise/bleed easily.  Psychiatric/Behavioral: Negative for confusion.    MEDICAL HISTORY:  Past Medical History:  Diagnosis Date  . Diabetes mellitus without complication (HCC)   . GERD (gastroesophageal reflux disease)   . High triglycerides   . Hypertension   . Long-term insulin use in type 2 diabetes (HCC)   . Morbid obesity (HCC)   . Psoriatic arthritis (HCC)   . SVT (supraventricular tachycardia) (HCC)     SURGICAL HISTORY: History reviewed. No pertinent surgical history.  SOCIAL HISTORY: Social History   Socioeconomic History  . Marital status: Single    Spouse name: Not on file  . Number of children: Not on file  . Years of education: Not on file  . Highest education level: Not on file  Occupational History  . Not on file  Tobacco Use  . Smoking status:   Never Smoker  . Smokeless tobacco: Never Used  Vaping Use  . Vaping Use: Never used  Substance and Sexual Activity  .  Alcohol use: Yes    Alcohol/week: 1.0 standard drink    Types: 1 Glasses of wine per week    Comment: very occasional  . Drug use: Never  . Sexual activity: Not on file  Other Topics Concern  . Not on file  Social History Narrative   Lives in [place] partner [name].  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     Social Determinants of Health   Financial Resource Strain: Low Risk   . Difficulty of Paying Living Expenses: Not hard at all  Food Insecurity:   . Worried About Running Out of Food in the Last Year: Not on file  . Ran Out of Food in the Last Year: Not on file  Transportation Needs:   . Lack of Transportation (Medical): Not on file  . Lack of Transportation (Non-Medical): Not on file  Physical Activity:   . Days of Exercise per Week: Not on file  . Minutes of Exercise per Session: Not on file  Stress:   . Feeling of Stress : Not on file  Social Connections:   . Frequency of Communication with Friends and Family: Not on file  . Frequency of Social Gatherings with Friends and Family: Not on file  . Attends Religious Services: Not on file  . Active Member of Clubs or Organizations: Not on file  . Attends Club or Organization Meetings: Not on file  . Marital Status: Not on file  Intimate Partner Violence:   . Fear of Current or Ex-Partner: Not on file  . Emotionally Abused: Not on file  . Physically Abused: Not on file  . Sexually Abused: Not on file    FAMILY HISTORY: Family History  Problem Relation Age of Onset  . Arthritis Mother   . Heart disease Mother   . Supraventricular tachycardia Mother        s/p ablation  . Hypertension Mother   . Diabetes Father   . Heart attack Maternal Uncle   . Throat cancer Maternal Uncle   . Esophageal cancer Maternal Uncle   . Heart attack Maternal Uncle   . Heart attack Maternal Uncle   . Prostate cancer Neg Hx   . Thyroid cancer Neg Hx   . Colon cancer Neg Hx   . Rectal cancer Neg Hx   . Stomach cancer Neg Hx     ALLERGIES:  has No  Known Allergies.  MEDICATIONS:  Current Outpatient Medications  Medication Sig Dispense Refill  . amLODipine (NORVASC) 10 MG tablet Take 1 tablet (10 mg total) by mouth daily. 90 tablet 2  . Ascorbic Acid (VITAMIN C) 1000 MG tablet Take 1,000 mg by mouth daily.    . Continuous Blood Gluc Sensor (FREESTYLE LIBRE 2 SENSOR) MISC Scan glucose at least QID 2 each 11  . famotidine (PEPCID) 20 MG tablet Take 1 tablet (20 mg total) by mouth 2 (two) times daily. 60 tablet 1  . glucose blood test strip 1 each by Other route as needed for other. Use to test blood sugar twice daily. 100 each 3  . hydrochlorothiazide (HYDRODIURIL) 25 MG tablet Take 25 mg by mouth daily.    . insulin lispro (HUMALOG KWIKPEN) 100 UNIT/ML KwikPen Inject 10 units into the skin 10 minutes prior to lunch & dinner. Hold if glucose is less than 150. (Patient taking differently: 25 Units. Inject 10 units   into the skin 10 minutes prior to lunch & dinner. Hold if glucose is less than 150.) 15 mL 5  . Insulin Pen Needle (PEN NEEDLES) 32G X 6 MM MISC Used to give insulin injections 4 times daily. 100 each 10  . Lancets (ONETOUCH ULTRASOFT) lancets Used to check blood sugars twice daily. 100 each 12  . LANTUS SOLOSTAR 100 UNIT/ML Solostar Pen INJECT SUBCUTANEOUSLY 50  UNITS DAILY (Patient taking differently: Inject 60 Units into the skin. ) 45 mL 3  . [name] Take 5 mg by mouth every evening.     . metoprolol succinate (TOPROL-XL) 100 MG 24 hr tablet TAKE 1 TABLET BY MOUTH  DAILY WITH OR IMMEDIATELY  FOLLOWING A MEAL 90 tablet 3  . sulfamethoxazole-trimethoprim (BACTRIM DS) 800-160 MG tablet Take 1 tablet by mouth 2 (two) times daily. 20 tablet 0  . telmisartan (MICARDIS) 40 MG tablet Take 1 tablet (40 mg total) by mouth daily. 30 tablet 2  . Turmeric 500 MG CAPS Take by mouth.    . ALPRAZolam (XANAX) 0.5 MG tablet Take half tablet to one tablet by mouth as needed daily for insomnia, anxiety. 30 tablet 0  . metFORMIN (GLUCOPHAGE XR)  500 MG 24 hr tablet Start 500mg PO qpm for one week. Then the next week, take 1000mg qhs. (Patient not taking: Reported on 05/10/2020) 90 tablet 3  . metoprolol succinate (TOPROL-XL) 25 MG 24 hr tablet Take 1 tablet (25 mg) by mouth once daily at bedtime (Patient not taking: Reported on 05/10/2020) 90 tablet 3  . ondansetron (ZOFRAN ODT) 4 MG disintegrating tablet Take 1 tablet (4 mg total) by mouth every 8 (eight) hours as needed for nausea or vomiting. (Patient not taking: Reported on 05/10/2020) 30 tablet 2   Current Facility-Administered Medications  Medication Dose Route Frequency Provider Last Rate Last Admin  . 0.9 %  sodium chloride infusion  500 mL Intravenous Once Danis, Henry L III, MD         PHYSICAL EXAMINATION: ECOG PERFORMANCE STATUS: 1 - Symptomatic but completely ambulatory Vitals:   05/10/20 0901  BP: 137/78  Pulse: 97  Resp: 18  Temp: 98 F (36.7 C)   Filed Weights   05/10/20 0901  Weight: 246 lb (111.6 kg)    Physical Exam Constitutional:      General: He is not in acute distress. HENT:     Head: Normocephalic and atraumatic.  Eyes:     General: No scleral icterus. Cardiovascular:     Rate and Rhythm: Normal rate and regular rhythm.     Heart sounds: Normal heart sounds.  Pulmonary:     Effort: Pulmonary effort is normal. No respiratory distress.     Breath sounds: No wheezing.  Abdominal:     General: Bowel sounds are normal. There is no distension.     Palpations: Abdomen is soft.  Musculoskeletal:        General: No deformity. Normal range of motion.     Cervical back: Normal range of motion and neck supple.  Skin:    General: Skin is warm and dry.     Findings: No erythema or rash.  Neurological:     Mental Status: He is alert and oriented to person, place, and time. Mental status is at baseline.     Cranial Nerves: No cranial nerve deficit.     Coordination: Coordination normal.  Psychiatric:        Mood and Affect: Mood normal.      LABORATORY   DATA:  I have reviewed the data as listed Lab Results  Component Value Date   WBC 9.5 05/08/2020   HGB 12.7 (L) 05/08/2020   HCT 39.0 05/08/2020   MCV 80.6 05/08/2020   PLT 443 (H) 05/08/2020   Recent Labs    08/11/19 1007 10/17/19 0928 10/17/19 0928 10/18/19 1444 03/07/20 0902 03/13/20 1255 04/10/20 1021 04/25/20 0942 05/07/20 0933  NA   < > 137  --  134* 135  --   --   --  136  K   < > 5.7*  --  3.7 4.5  --   --   --  4.4  CL   < > 96  --  99 94*  --   --   --  98  CO2   < > 23  --  25 25  --   --   --  21  GLUCOSE   < > 246*  --  377* 109*  --   --   --  175*  BUN   < > 14   < > 16 13  --  18  --  12  CREATININE   < > 0.93   < > 0.93 0.79  --  0.91  --  0.86  CALCIUM   < > 9.7  --  8.9 9.4  --   --   --  9.2  GFRNONAA   < > 96   < > >60 105  --  98  --  101  GFRAA   < > 111   < > >60 121  --  113  --  117  PROT  --  7.4  --   --  7.6  --   --  6.5  --   ALBUMIN  --  4.4  --   --  4.2  --   --  3.1*  --   AST  --  22  --   --  27  --   --  18  --   ALT  --  24  --   --  25  --   --  17  --   ALKPHOS  --  85   < >  --  179* 172*  --  215*  --   BILITOT  --  0.6  --   --  0.4  --   --  0.7  --   BILIDIR  --   --   --   --   --   --   --  0.47*  --    < > = values in this interval not displayed.   Iron/TIBC/Ferritin/ %Sat No results found for: IRON, TIBC, FERRITIN, IRONPCTSAT    RADIOGRAPHIC STUDIES: I have personally reviewed the radiological images as listed and agreed with the findings in the report. MR LIVER W WO CONTRAST  Result Date: 05/02/2020 CLINICAL DATA:  Evaluate liver lesions identified on recent ultrasound. EXAM: MRI ABDOMEN WITHOUT AND WITH CONTRAST TECHNIQUE: Multiplanar multisequence MR imaging of the abdomen was performed both before and after the administration of intravenous contrast. CONTRAST:  10mL GADAVIST GADOBUTROL 1 MMOL/ML IV SOLN COMPARISON:  Abdominal sonogram 04/30/2020 FINDINGS: Lower chest: No acute findings.  Hepatobiliary: There are numerous heterogeneous lesions identified throughout the liver. Lesions demonstrate peripheral and heterogeneous enhancement with central low signal intensity. Smaller lesions have a target like appearance. -Index lesion within segment 5 measures 8.6 x 6.7 cm, image 62/15. -large lesion   within segment 8 measures 9.9 x 8.8 cm, image 29/15. -Lesion within the left lobe of liver measures 8.2 x 5.2 cm, image 38/15. The gallbladder appears within normal limits. No gallstones or signs of gallbladder wall inflammation. Common bile duct is normal in caliber. Pancreas: Subtle, solid-appearing enhancing lesion within tail of pancreas is noted measuring 1.9 x 1.5 cm, image 49/13. Distal to this lesion is mild parenchymal atrophy with mild main duct and branch duct dilatation, image 17/3. Spleen: The spleen measures 13.1 x 5.5 x 15.6 cm (volume = 590 cm^3), image 13/3. Small cystic structure along the subcapsular aspect of the posterior spleen measures 2.1 cm, image 13/3. Adrenals/Urinary Tract: Normal appearance of the adrenal glands. No hydronephrosis identified bilaterally. Small bilateral T2 hyperintense kidney lesions are identified. These are technically too small to reliably characterize. The largest is in the left mid kidney measuring up to 0.8 cm. No hydronephrosis identified bilaterally. Stomach/Bowel: Stomach appears within normal limits. No dilated loops of bowel identified within the imaged portions of the abdomen and pelvis. Vascular/Lymphatic: Porta hepatic lymph node has a short axis of 1.5 cm, image 24/5. Portacaval lymph node measures 1.4 cm, image 28/5. No abdominal aortic aneurysm demonstrated. Other:  None. Musculoskeletal: No suspicious bone lesions identified. IMPRESSION: 1. There are numerous heterogeneous enhancing lesions throughout the liver concerning for metastatic disease. These do not have the typical imaging features of hepatoma and may be amendable to percutaneous tissue  sampling under ultrasound guidance. 2. There is a solid-appearing enhancing lesion within tail of pancreas measuring 1.9 x 1.5 cm. There is atrophy of the distal tail of pancreas with mild increase caliber of the main duct and intrahepatic ducts. This is suspicious for primary pancreatic neoplasm. Further evaluation with endoscopic ultrasound and tissue sampling is advised. 3. Mild adenopathy noted within the porta hepatic region and porta hepatic region. 4. Mild splenomegaly. Electronically Signed   By: Taylor  Stroud M.D.   On: 05/02/2020 13:05   US BIOPSY (LIVER)  Result Date: 05/08/2020 INDICATION: 49-year-old with multiple liver lesions and a suspicious pancreatic lesion. Findings are concerning for metastatic disease and patient needs a tissue diagnosis. EXAM: ULTRASOUND-GUIDED LIVER LESION BIOPSY MEDICATIONS: None. ANESTHESIA/SEDATION: Moderate (conscious) sedation was employed during this procedure. A total of Versed 2.0 mg and Fentanyl 100 mcg was administered intravenously. Moderate Sedation Time: 17 minutes. The patient's level of consciousness and vital signs were monitored continuously by radiology nursing throughout the procedure under my direct supervision. FLUOROSCOPY TIME:  None COMPLICATIONS: None immediate. PROCEDURE: Informed written consent was obtained from the patient after a thorough discussion of the procedural risks, benefits and alternatives. All questions were addressed. A timeout was performed prior to the initiation of the procedure. Liver was evaluated with ultrasound. Multiple liver lesions were identified. Lesion in the left hepatic lobe was targeted for biopsy. The anterior abdomen was prepped with chlorhexidine and sterile field was created. Skin and soft tissues were anesthetized using 1% lidocaine. A small incision was made. Using ultrasound guidance, 17 gauge coaxial needle was directed into the left hepatic lobe and a hepatic lesion. Needle position was confirmed within  the lesion. Total of 4 core biopsies were obtained using an 18 gauge core device. Specimens placed in formalin. Needle was removed without complication. Bandage placed over the puncture site. FINDINGS: Numerous heterogeneous liver lesions. Majority of the lesions are slightly hyperechoic. Needle placement confirmed within a left hepatic lesion. Adequate core specimens obtained. No immediate bleeding or hematoma formation. IMPRESSION: Ultrasound-guided core biopsy of a   hepatic lesion. Electronically Signed   By: Adam  Henn M.D.   On: 05/08/2020 10:20   US ABDOMEN LIMITED RUQ  Result Date: 04/30/2020 CLINICAL DATA:  49-year-old male with elevated LFTs. EXAM: ULTRASOUND ABDOMEN LIMITED RIGHT UPPER QUADRANT COMPARISON:  None. FINDINGS: Gallbladder: No gallstones or wall thickening visualized. No sonographic Murphy sign noted by sonographer. Common bile duct: Diameter: 5 mm. Liver: The liver demonstrates a heterogeneous echogenicity with lobulated contour in keeping with cirrhosis. Multiple echogenic liver masses noted with the largest mass measuring up to 8.0 x 9.3 x 9.3 cm in the right lobe most consistent with malignancy. Further characterization with MRI without and with contrast is recommended. Portal vein is patent on color Doppler imaging with normal direction of blood flow towards the liver. Other: None. IMPRESSION: Cirrhosis with multiple hepatic masses consistent with malignancy (HCC). Further characterization with MRI without and with contrast recommended. These results will be called to the ordering clinician or representative by the Radiologist Assistant, and communication documented in the PACS or [facility] Dashboard. Electronically Signed   By: Arash  Radparvar M.D.   On: 04/30/2020 22:52      ASSESSMENT & PLAN:  1. Metastatic colorectal cancer (HCC)   2. Pancreatic mass   3. Goals of care, counseling/discussion   4. Syncope, unspecified syncope type    # Images were independently reviewed by  me and discussed with patient.  US liver biopsy showed metastatic adenocarcinoma, favors colorectal primary.  Check CEA  Patient will need to have colonoscopy for further evaluation.  Discussed about systemic chemotherapy.  Goals of care is with palliative intent.   # Pancreatic mass, maybe a second primary process. Check CA 19.9. Patient prefers the lab to be done at [facility]. Rx was provded.   # Syncope Lost consciousness briefly in the clinic,bradycardic, consistent with syncope event.  Symptoms improved and bradycardia resolved.  I advise him to go to ER for further evaluation and management. Patient admantly declined as he has felt much better.    All questions were answered. The patient knows to call the clinic with any problems questions or concerns.  cc Arnett, Margaret G, FNP    Return of visit: to be determined.  Thank you for this kind referral and the opportunity to participate in the care of this patient. A copy of today's note is routed to referring provider    Malikah Lakey, MD, PhD Hematology Oncology [facility] Pager- [phone] 05/10/2020  

## 2020-05-10 NOTE — Progress Notes (Signed)
Hematology/Oncology Consult note [facility] [phone] [phone]   Patient Care Team: Arnett, Margaret G, FNP as PCP - General (Family Medicine) End, Christopher, MD as PCP - Cardiology (Cardiology) Travis, Catherine E, RPH-CPP (Pharmacist) Stanton, Kristi D, RN as Oncology Nurse Navigator  REFERRING PROVIDER: Arnett, Margaret G, FNP  CHIEF COMPLAINTS/REASON FOR VISIT:  Evaluation of liver and pancreatic mass.   HISTORY OF PRESENTING ILLNESS:   Dominic [name] is a  49 y.o.  male with PMH listed below was seen in consultation at the request of  Arnett, Margaret G, FNP  for evaluation of liver and pancreatic mass  #reports symptoms of upper abdomen band like discomfort and bloating for a few months, improved symptoms with omeprazole,.Chronic constipation, he has blood in the stool occationally Seen by PCP in September 2021. AlK phosphatase was recently noted to be elevated as well well elevated GGT. He was referred to see gastroenteralgias on 04/10/2020.  04/25/2020 EGD showed gastric fundus mild inflammation. Biopsy showed reactive gastropathy.  04/30/2020 US RUQ showed cirrhosis with multiple hepatic masses.  05/01/2020 AFP 1.7 05/02/2020 MR liver w wo contrast showed numerous heterozygous enhancing liver lesions, throughout the liver, not typical for HCC, likely metastatic disease.  Solid appearing enhancing lesion in pancreatic tail 1.9x1.5cm, suspicious for primary neoplasm.  Mild adenopathy within porta hepatic region and porta hepatic region. Splenomegaly.   Patient has history of SVT, psoriatic arthritis, morbid obesity, GERD, DM.  Family history + maternal uncle with esophageal cancer.   + unintentional weight loss, no fever, chills. "sweats a lot". Currently on antibiotics for proctitis.  He works at [employer], lives in [place]. He lives with his partner.  During today's visit, he felt dizziness and lightheaded when sitting on the  examination table.  He lost consciousness briefly for about 2-3 minutes, regain consciousness spontaneously, perfusing sweating. SBP in 90s, and HR initially in 30-40s, later improved to 70s.  He denies any chest pain, SOB. Blood sugar was 207.    Review of Systems  Constitutional: Positive for fatigue and unexpected weight change. Negative for appetite change, chills and fever.  HENT:   Negative for hearing loss and voice change.   Eyes: Negative for eye problems and icterus.  Respiratory: Negative for chest tightness, cough and shortness of breath.   Cardiovascular: Negative for chest pain and leg swelling.  Gastrointestinal: Negative for abdominal distention and abdominal pain.  Endocrine: Negative for hot flashes.  Genitourinary: Negative for difficulty urinating, dysuria and frequency.   Musculoskeletal: Negative for arthralgias.  Skin: Negative for itching and rash.  Neurological: Positive for light-headedness. Negative for numbness.  Hematological: Negative for adenopathy. Does not bruise/bleed easily.  Psychiatric/Behavioral: Negative for confusion.    MEDICAL HISTORY:  Past Medical History:  Diagnosis Date  . Diabetes mellitus without complication (HCC)   . GERD (gastroesophageal reflux disease)   . High triglycerides   . Hypertension   . Long-term insulin use in type 2 diabetes (HCC)   . Morbid obesity (HCC)   . Psoriatic arthritis (HCC)   . SVT (supraventricular tachycardia) (HCC)     SURGICAL HISTORY: History reviewed. No pertinent surgical history.  SOCIAL HISTORY: Social History   Socioeconomic History  . Marital status: Single    Spouse name: Not on file  . Number of children: Not on file  . Years of education: Not on file  . Highest education level: Not on file  Occupational History  . Not on file  Tobacco Use  . Smoking status:   Never Smoker  . Smokeless tobacco: Never Used  Vaping Use  . Vaping Use: Never used  Substance and Sexual Activity  .  Alcohol use: Yes    Alcohol/week: 1.0 standard drink    Types: 1 Glasses of wine per week    Comment: very occasional  . Drug use: Never  . Sexual activity: Not on file  Other Topics Concern  . Not on file  Social History Narrative   Lives in [place] partner [name].  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     Social Determinants of Health   Financial Resource Strain: Low Risk   . Difficulty of Paying Living Expenses: Not hard at all  Food Insecurity:   . Worried About Running Out of Food in the Last Year: Not on file  . Ran Out of Food in the Last Year: Not on file  Transportation Needs:   . Lack of Transportation (Medical): Not on file  . Lack of Transportation (Non-Medical): Not on file  Physical Activity:   . Days of Exercise per Week: Not on file  . Minutes of Exercise per Session: Not on file  Stress:   . Feeling of Stress : Not on file  Social Connections:   . Frequency of Communication with Friends and Family: Not on file  . Frequency of Social Gatherings with Friends and Family: Not on file  . Attends Religious Services: Not on file  . Active Member of Clubs or Organizations: Not on file  . Attends Club or Organization Meetings: Not on file  . Marital Status: Not on file  Intimate Partner Violence:   . Fear of Current or Ex-Partner: Not on file  . Emotionally Abused: Not on file  . Physically Abused: Not on file  . Sexually Abused: Not on file    FAMILY HISTORY: Family History  Problem Relation Age of Onset  . Arthritis Mother   . Heart disease Mother   . Supraventricular tachycardia Mother        s/p ablation  . Hypertension Mother   . Diabetes Father   . Heart attack Maternal Uncle   . Throat cancer Maternal Uncle   . Esophageal cancer Maternal Uncle   . Heart attack Maternal Uncle   . Heart attack Maternal Uncle   . Prostate cancer Neg Hx   . Thyroid cancer Neg Hx   . Colon cancer Neg Hx   . Rectal cancer Neg Hx   . Stomach cancer Neg Hx     ALLERGIES:  has No  Known Allergies.  MEDICATIONS:  Current Outpatient Medications  Medication Sig Dispense Refill  . amLODipine (NORVASC) 10 MG tablet Take 1 tablet (10 mg total) by mouth daily. 90 tablet 2  . Ascorbic Acid (VITAMIN C) 1000 MG tablet Take 1,000 mg by mouth daily.    . Continuous Blood Gluc Sensor (FREESTYLE LIBRE 2 SENSOR) MISC Scan glucose at least QID 2 each 11  . famotidine (PEPCID) 20 MG tablet Take 1 tablet (20 mg total) by mouth 2 (two) times daily. 60 tablet 1  . glucose blood test strip 1 each by Other route as needed for other. Use to test blood sugar twice daily. 100 each 3  . hydrochlorothiazide (HYDRODIURIL) 25 MG tablet Take 25 mg by mouth daily.    . insulin lispro (HUMALOG KWIKPEN) 100 UNIT/ML KwikPen Inject 10 units into the skin 10 minutes prior to lunch & dinner. Hold if glucose is less than 150. (Patient taking differently: 25 Units. Inject 10 units   into the skin 10 minutes prior to lunch & dinner. Hold if glucose is less than 150.) 15 mL 5  . Insulin Pen Needle (PEN NEEDLES) 32G X 6 MM MISC Used to give insulin injections 4 times daily. 100 each 10  . Lancets (ONETOUCH ULTRASOFT) lancets Used to check blood sugars twice daily. 100 each 12  . LANTUS SOLOSTAR 100 UNIT/ML Solostar Pen INJECT SUBCUTANEOUSLY 50  UNITS DAILY (Patient taking differently: Inject 60 Units into the skin. ) 45 mL 3  . [name] Take 5 mg by mouth every evening.     . metoprolol succinate (TOPROL-XL) 100 MG 24 hr tablet TAKE 1 TABLET BY MOUTH  DAILY WITH OR IMMEDIATELY  FOLLOWING A MEAL 90 tablet 3  . sulfamethoxazole-trimethoprim (BACTRIM DS) 800-160 MG tablet Take 1 tablet by mouth 2 (two) times daily. 20 tablet 0  . telmisartan (MICARDIS) 40 MG tablet Take 1 tablet (40 mg total) by mouth daily. 30 tablet 2  . Turmeric 500 MG CAPS Take by mouth.    . ALPRAZolam (XANAX) 0.5 MG tablet Take half tablet to one tablet by mouth as needed daily for insomnia, anxiety. 30 tablet 0  . metFORMIN (GLUCOPHAGE XR)  500 MG 24 hr tablet Start 500mg PO qpm for one week. Then the next week, take 1000mg qhs. (Patient not taking: Reported on 05/10/2020) 90 tablet 3  . metoprolol succinate (TOPROL-XL) 25 MG 24 hr tablet Take 1 tablet (25 mg) by mouth once daily at bedtime (Patient not taking: Reported on 05/10/2020) 90 tablet 3  . ondansetron (ZOFRAN ODT) 4 MG disintegrating tablet Take 1 tablet (4 mg total) by mouth every 8 (eight) hours as needed for nausea or vomiting. (Patient not taking: Reported on 05/10/2020) 30 tablet 2   Current Facility-Administered Medications  Medication Dose Route Frequency Provider Last Rate Last Admin  . 0.9 %  sodium chloride infusion  500 mL Intravenous Once Danis, Henry L III, MD         PHYSICAL EXAMINATION: ECOG PERFORMANCE STATUS: 1 - Symptomatic but completely ambulatory Vitals:   05/10/20 0901  BP: 137/78  Pulse: 97  Resp: 18  Temp: 98 F (36.7 C)   Filed Weights   05/10/20 0901  Weight: 246 lb (111.6 kg)    Physical Exam Constitutional:      General: He is not in acute distress. HENT:     Head: Normocephalic and atraumatic.  Eyes:     General: No scleral icterus. Cardiovascular:     Rate and Rhythm: Normal rate and regular rhythm.     Heart sounds: Normal heart sounds.  Pulmonary:     Effort: Pulmonary effort is normal. No respiratory distress.     Breath sounds: No wheezing.  Abdominal:     General: Bowel sounds are normal. There is no distension.     Palpations: Abdomen is soft.  Musculoskeletal:        General: No deformity. Normal range of motion.     Cervical back: Normal range of motion and neck supple.  Skin:    General: Skin is warm and dry.     Findings: No erythema or rash.  Neurological:     Mental Status: He is alert and oriented to person, place, and time. Mental status is at baseline.     Cranial Nerves: No cranial nerve deficit.     Coordination: Coordination normal.  Psychiatric:        Mood and Affect: Mood normal.      LABORATORY   DATA:  I have reviewed the data as listed Lab Results  Component Value Date   WBC 9.5 05/08/2020   HGB 12.7 (L) 05/08/2020   HCT 39.0 05/08/2020   MCV 80.6 05/08/2020   PLT 443 (H) 05/08/2020   Recent Labs    08/11/19 1007 10/17/19 0928 10/17/19 0928 10/18/19 1444 03/07/20 0902 03/13/20 1255 04/10/20 1021 04/25/20 0942 05/07/20 0933  NA   < > 137  --  134* 135  --   --   --  136  K   < > 5.7*  --  3.7 4.5  --   --   --  4.4  CL   < > 96  --  99 94*  --   --   --  98  CO2   < > 23  --  25 25  --   --   --  21  GLUCOSE   < > 246*  --  377* 109*  --   --   --  175*  BUN   < > 14   < > 16 13  --  18  --  12  CREATININE   < > 0.93   < > 0.93 0.79  --  0.91  --  0.86  CALCIUM   < > 9.7  --  8.9 9.4  --   --   --  9.2  GFRNONAA   < > 96   < > >60 105  --  98  --  101  GFRAA   < > 111   < > >60 121  --  113  --  117  PROT  --  7.4  --   --  7.6  --   --  6.5  --   ALBUMIN  --  4.4  --   --  4.2  --   --  3.1*  --   AST  --  22  --   --  27  --   --  18  --   ALT  --  24  --   --  25  --   --  17  --   ALKPHOS  --  85   < >  --  179* 172*  --  215*  --   BILITOT  --  0.6  --   --  0.4  --   --  0.7  --   BILIDIR  --   --   --   --   --   --   --  0.47*  --    < > = values in this interval not displayed.   Iron/TIBC/Ferritin/ %Sat No results found for: IRON, TIBC, FERRITIN, IRONPCTSAT    RADIOGRAPHIC STUDIES: I have personally reviewed the radiological images as listed and agreed with the findings in the report. MR LIVER W WO CONTRAST  Result Date: 05/02/2020 CLINICAL DATA:  Evaluate liver lesions identified on recent ultrasound. EXAM: MRI ABDOMEN WITHOUT AND WITH CONTRAST TECHNIQUE: Multiplanar multisequence MR imaging of the abdomen was performed both before and after the administration of intravenous contrast. CONTRAST:  10mL GADAVIST GADOBUTROL 1 MMOL/ML IV SOLN COMPARISON:  Abdominal sonogram 04/30/2020 FINDINGS: Lower chest: No acute findings.  Hepatobiliary: There are numerous heterogeneous lesions identified throughout the liver. Lesions demonstrate peripheral and heterogeneous enhancement with central low signal intensity. Smaller lesions have a target like appearance. -Index lesion within segment 5 measures 8.6 x 6.7 cm, image 62/15. -large lesion   within segment 8 measures 9.9 x 8.8 cm, image 29/15. -Lesion within the left lobe of liver measures 8.2 x 5.2 cm, image 38/15. The gallbladder appears within normal limits. No gallstones or signs of gallbladder wall inflammation. Common bile duct is normal in caliber. Pancreas: Subtle, solid-appearing enhancing lesion within tail of pancreas is noted measuring 1.9 x 1.5 cm, image 49/13. Distal to this lesion is mild parenchymal atrophy with mild main duct and branch duct dilatation, image 17/3. Spleen: The spleen measures 13.1 x 5.5 x 15.6 cm (volume = 590 cm^3), image 13/3. Small cystic structure along the subcapsular aspect of the posterior spleen measures 2.1 cm, image 13/3. Adrenals/Urinary Tract: Normal appearance of the adrenal glands. No hydronephrosis identified bilaterally. Small bilateral T2 hyperintense kidney lesions are identified. These are technically too small to reliably characterize. The largest is in the left mid kidney measuring up to 0.8 cm. No hydronephrosis identified bilaterally. Stomach/Bowel: Stomach appears within normal limits. No dilated loops of bowel identified within the imaged portions of the abdomen and pelvis. Vascular/Lymphatic: Porta hepatic lymph node has a short axis of 1.5 cm, image 24/5. Portacaval lymph node measures 1.4 cm, image 28/5. No abdominal aortic aneurysm demonstrated. Other:  None. Musculoskeletal: No suspicious bone lesions identified. IMPRESSION: 1. There are numerous heterogeneous enhancing lesions throughout the liver concerning for metastatic disease. These do not have the typical imaging features of hepatoma and may be amendable to percutaneous tissue  sampling under ultrasound guidance. 2. There is a solid-appearing enhancing lesion within tail of pancreas measuring 1.9 x 1.5 cm. There is atrophy of the distal tail of pancreas with mild increase caliber of the main duct and intrahepatic ducts. This is suspicious for primary pancreatic neoplasm. Further evaluation with endoscopic ultrasound and tissue sampling is advised. 3. Mild adenopathy noted within the porta hepatic region and porta hepatic region. 4. Mild splenomegaly. Electronically Signed   By: Taylor  Stroud M.D.   On: 05/02/2020 13:05   US BIOPSY (LIVER)  Result Date: 05/08/2020 INDICATION: 49-year-old with multiple liver lesions and a suspicious pancreatic lesion. Findings are concerning for metastatic disease and patient needs a tissue diagnosis. EXAM: ULTRASOUND-GUIDED LIVER LESION BIOPSY MEDICATIONS: None. ANESTHESIA/SEDATION: Moderate (conscious) sedation was employed during this procedure. A total of Versed 2.0 mg and Fentanyl 100 mcg was administered intravenously. Moderate Sedation Time: 17 minutes. The patient's level of consciousness and vital signs were monitored continuously by radiology nursing throughout the procedure under my direct supervision. FLUOROSCOPY TIME:  None COMPLICATIONS: None immediate. PROCEDURE: Informed written consent was obtained from the patient after a thorough discussion of the procedural risks, benefits and alternatives. All questions were addressed. A timeout was performed prior to the initiation of the procedure. Liver was evaluated with ultrasound. Multiple liver lesions were identified. Lesion in the left hepatic lobe was targeted for biopsy. The anterior abdomen was prepped with chlorhexidine and sterile field was created. Skin and soft tissues were anesthetized using 1% lidocaine. A small incision was made. Using ultrasound guidance, 17 gauge coaxial needle was directed into the left hepatic lobe and a hepatic lesion. Needle position was confirmed within  the lesion. Total of 4 core biopsies were obtained using an 18 gauge core device. Specimens placed in formalin. Needle was removed without complication. Bandage placed over the puncture site. FINDINGS: Numerous heterogeneous liver lesions. Majority of the lesions are slightly hyperechoic. Needle placement confirmed within a left hepatic lesion. Adequate core specimens obtained. No immediate bleeding or hematoma formation. IMPRESSION: Ultrasound-guided core biopsy of a   hepatic lesion. Electronically Signed   By: Adam  Henn M.D.   On: 05/08/2020 10:20   US ABDOMEN LIMITED RUQ  Result Date: 04/30/2020 CLINICAL DATA:  49-year-old male with elevated LFTs. EXAM: ULTRASOUND ABDOMEN LIMITED RIGHT UPPER QUADRANT COMPARISON:  None. FINDINGS: Gallbladder: No gallstones or wall thickening visualized. No sonographic Murphy sign noted by sonographer. Common bile duct: Diameter: 5 mm. Liver: The liver demonstrates a heterogeneous echogenicity with lobulated contour in keeping with cirrhosis. Multiple echogenic liver masses noted with the largest mass measuring up to 8.0 x 9.3 x 9.3 cm in the right lobe most consistent with malignancy. Further characterization with MRI without and with contrast is recommended. Portal vein is patent on color Doppler imaging with normal direction of blood flow towards the liver. Other: None. IMPRESSION: Cirrhosis with multiple hepatic masses consistent with malignancy (HCC). Further characterization with MRI without and with contrast recommended. These results will be called to the ordering clinician or representative by the Radiologist Assistant, and communication documented in the PACS or [facility] Dashboard. Electronically Signed   By: Arash  Radparvar M.D.   On: 04/30/2020 22:52      ASSESSMENT & PLAN:  1. Metastatic colorectal cancer (HCC)   2. Pancreatic mass   3. Goals of care, counseling/discussion   4. Syncope, unspecified syncope type    # Images were independently reviewed by  me and discussed with patient.  US liver biopsy showed metastatic adenocarcinoma, favors colorectal primary.  Check CEA  Patient will need to have colonoscopy for further evaluation.  Discussed about systemic chemotherapy.  Goals of care is with palliative intent.   # Pancreatic mass, maybe a second primary process. Check CA 19.9. Patient prefers the lab to be done at [facility]. Rx was provded.   # Syncope Lost consciousness briefly in the clinic,bradycardic, consistent with syncope event.  Symptoms improved and bradycardia resolved.  I advise him to go to ER for further evaluation and management. Patient admantly declined as he has felt much better.    All questions were answered. The patient knows to call the clinic with any problems questions or concerns.  cc Arnett, Margaret G, FNP    Return of visit: to be determined.  Thank you for this kind referral and the opportunity to participate in the care of this patient. A copy of today's note is routed to referring provider    Auda Finfrock, MD, PhD Hematology Oncology [facility] Pager- [phone] 05/10/2020  

## 2020-05-11 ENCOUNTER — Other Ambulatory Visit: Payer: Self-pay | Admitting: Family

## 2020-05-11 ENCOUNTER — Encounter: Payer: Self-pay | Admitting: Oncology

## 2020-05-11 DIAGNOSIS — E119 Type 2 diabetes mellitus without complications: Secondary | ICD-10-CM

## 2020-05-11 DIAGNOSIS — Z794 Long term (current) use of insulin: Secondary | ICD-10-CM

## 2020-05-12 ENCOUNTER — Encounter: Payer: Self-pay | Admitting: Oncology

## 2020-05-13 ENCOUNTER — Telehealth: Payer: Self-pay

## 2020-05-13 ENCOUNTER — Telehealth: Payer: Self-pay | Admitting: Gastroenterology

## 2020-05-13 ENCOUNTER — Telehealth: Payer: Self-pay | Admitting: Oncology

## 2020-05-13 ENCOUNTER — Other Ambulatory Visit: Payer: Self-pay

## 2020-05-13 DIAGNOSIS — C19 Malignant neoplasm of rectosigmoid junction: Secondary | ICD-10-CM

## 2020-05-13 DIAGNOSIS — K8689 Other specified diseases of pancreas: Secondary | ICD-10-CM

## 2020-05-13 NOTE — Telephone Encounter (Signed)
Called and made pt aware of his sched 05/15/20 CT appt And also depending on his ins auth that 11/3 sate may change And he will be notified sometime tomorrow if CT has to be cx

## 2020-05-13 NOTE — Telephone Encounter (Signed)
Per Dr. Tasia Catchings [place]: i just talked to him, communicated about the final biopsy resutls. adenocarcinoma with colorectal origin, and alos pancreatic tail mass. CA 19.9 signifincaly elevated, as well as elevated CEA. Kriti, he needs a colonoscopy, also does he need EUS biopsy of pancreatic lesion, it is on the tail, probably easier with CT guided  Mariea Clonts, RN: I will arrange colonoscopy. You can check with IR about pancreas.  EUS will reach pancreatic tail if needed  Dr. Tasia Catchings: Talked to IR.  not very feasible via CT guided biopsy. please get CT chest w contrast to finish staging, and also EUS guided biopsy. will EUS folks do colon too? probably no previlige to do that. worth checking. I discussed with him abou these possibilities.   Kristi: Dr. Loletha Carrow is going to plan on colonoscopy this Friday 11/5. He does not do EUS. It takes me over a month to get in [place] for EUS. I will plan on EUS here 11/11. Dr. Loletha Carrow also recommended presentation of his case at GI tumor board to get Jacobs/Mansouraty to weigh in. (Ones that do EUS). I doubt they would be able to do an EUS sooner than we can here.    Pt scheduled on 11/3 for CT chest to complete staging. Auth is required and Pending, per [facility]. Ellison Hughs will notify pt of CT appt.  Pt to be contacted if CT gets cancelled due to auth.

## 2020-05-13 NOTE — Telephone Encounter (Signed)
Dominic [name],  This is a patient known to me from recent clinic appointment and upper endoscopy.  He has since had imaging showing liver lesions worrisome for metastasis, and a biopsy showing metastatic cancer of a probable colorectal primary.  He was seen by oncology last week, and they are requesting a colonoscopy ASAP to confirm colon cancer.  I have time for colonoscopy in the morning this Friday, 05/17/2020. If there is a clinic appointment available with me on 05/15/2020, then I will gladly see him at that time to discuss further and plan.  If no clinic appointment available, then please expedite this by having him come to clinic in the next 2 days to meet with you, give prep instructions and make the arrangements.  Please contact him this afternoon and, if he is agreeable to that, put him in a procedure slot to hold it.  - HD

## 2020-05-13 NOTE — Telephone Encounter (Signed)
Spoke with patient, he is ready to proceed with colon on Friday, 05/17/20. Advised patient that his procedure will begin at 11:30 AM but he will need to arrive at 10:30 AM and he will need a driver that is able to stay with him. Pt is scheduled for a virtual pre-visit at 4 PM tomorrow.   Spoke with Alyse Low from the cancer center and she is aware that patient has been scheduled for his colonoscopy.

## 2020-05-13 NOTE — Telephone Encounter (Signed)
I called patient and communicated with him about the biopsy results. Lesion is positive for adenocarcinoma, compatible with colorectal origin. I also reviewed his blood work that was done at [facility] 19 9 is significantly elevated at 3575. CEA is also elevated at 811. I recommend patient to proceed with CT chest with contrast to complete staging. Pancreatic mass needs to be further evaluated.-Recommend EUS evaluation with possible biopsy of the pancreatic mass. Patient will need to have colonoscopy done for further evaluation.  He voices understanding agree with the plan.

## 2020-05-13 NOTE — Telephone Encounter (Signed)
Called and updated Dominic [name] regarding coordination of care. Dr. Loletha Carrow will be able to perform colonoscopy this week. His office will reach out and arrange this. In regard to EUS, not likely that we can have this done at the same time as colonoscopy. Dr. Loletha Carrow does not perform and we will arrange this with [facility] providers that come to [facility] unless advanced endoscopist at [facility] can perform prior to 05/23/20 (next available at [facility]). Orders for chest CT to complete staging placed.

## 2020-05-14 ENCOUNTER — Encounter: Payer: Self-pay | Admitting: Gastroenterology

## 2020-05-14 ENCOUNTER — Ambulatory Visit (AMBULATORY_SURGERY_CENTER): Payer: Self-pay | Admitting: *Deleted

## 2020-05-14 ENCOUNTER — Telehealth: Payer: Self-pay

## 2020-05-14 ENCOUNTER — Other Ambulatory Visit: Payer: Self-pay

## 2020-05-14 VITALS — Ht 69.0 in | Wt 249.0 lb

## 2020-05-14 DIAGNOSIS — C19 Malignant neoplasm of rectosigmoid junction: Secondary | ICD-10-CM

## 2020-05-14 DIAGNOSIS — K8689 Other specified diseases of pancreas: Secondary | ICD-10-CM

## 2020-05-14 NOTE — Progress Notes (Signed)

## 2020-05-14 NOTE — Telephone Encounter (Signed)
EUS scheduled for 05/23/2020. Called and went over instructions for EUS. Copy of instructions sent to his MyChart and can be located under the letters tab. All questions answered. Notified that authorization was obtained for his chest CT and to keep as scheduled on 11/3. Encouraged to call with any questions or needs.

## 2020-05-14 NOTE — Telephone Encounter (Signed)
per Marita [place]: APPROVED Auth# [id number] valid 05/13/20-08/12/19

## 2020-05-14 NOTE — Progress Notes (Signed)
Thanks for the update - I had been communicating with his PCP after the ultrasound report.  I have messaged my nurse to set up the colonoscopy within a week.  The cancer coordinator messaged me about the EUS, which they are arranging with a [facility] physician to be done at [facility] soon.  I suspect our two EUS docs are not available that soon.  Presenting his case at [facility] would be helpful, and I imaging you are already planning that.  - HD

## 2020-05-15 ENCOUNTER — Encounter: Payer: Self-pay | Admitting: Urology

## 2020-05-15 ENCOUNTER — Ambulatory Visit: Payer: Managed Care, Other (non HMO) | Admitting: Urology

## 2020-05-15 ENCOUNTER — Ambulatory Visit
Admission: RE | Admit: 2020-05-15 | Discharge: 2020-05-15 | Disposition: A | Discharge status: Home / Self Care | Payer: Managed Care, Other (non HMO) | Source: Ambulatory Visit | Attending: Oncology | Admitting: Oncology

## 2020-05-15 VITALS — BP 121/76 | HR 94 | Ht 69.0 in | Wt 246.0 lb

## 2020-05-15 DIAGNOSIS — C19 Malignant neoplasm of rectosigmoid junction: Secondary | ICD-10-CM | POA: Diagnosis present

## 2020-05-15 DIAGNOSIS — G894 Chronic pain syndrome: Secondary | ICD-10-CM | POA: Diagnosis not present

## 2020-05-15 DIAGNOSIS — N529 Male erectile dysfunction, unspecified: Secondary | ICD-10-CM | POA: Diagnosis not present

## 2020-05-15 DIAGNOSIS — N411 Chronic prostatitis: Secondary | ICD-10-CM | POA: Diagnosis not present

## 2020-05-15 DIAGNOSIS — K8689 Other specified diseases of pancreas: Secondary | ICD-10-CM | POA: Diagnosis present

## 2020-05-15 MED ORDER — TAMSULOSIN HCL 0.4 MG PO CAPS: 0.4000 mg | ORAL_CAPSULE | Freq: Every day | ORAL | 1 refills | Status: DC

## 2020-05-15 MED ORDER — IOHEXOL 300 MG/ML  SOLN
75.0000 mL | Freq: Once | INTRAMUSCULAR | Status: AC | PRN
  Administered 2020-05-15: 75 mL via INTRAVENOUS

## 2020-05-15 NOTE — Progress Notes (Signed)
05/15/2020 8:56 AM   Dominic [name] 01/14/1971 [id number]  Referring provider: Marval Regal, NP [address] [place],  [place] [address]  Chief Complaint  Patient presents with  . Dysuria    HPI: Dominic [name] is a 49 y.o. male seen at the request of Denice Paradise, NP for evaluation of dysuria, UTI.   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 Cancer ([facility])   . Diabetes mellitus without complication ([facility])   . GERD (gastroesophageal reflux disease)   . High triglycerides   . Hypertension   . Long-term insulin use in type 2 diabetes ([facility])   . Morbid obesity ([facility])   . Psoriatic arthritis ([facility])   . SVT (supraventricular tachycardia) [facility])     Surgical History: Past Surgical History:  Procedure Laterality Date  . UPPER GASTROINTESTINAL ENDOSCOPY      Home Medications:  Allergies as of 05/15/2020   No Known Allergies     Medication List       Accurate as of May 15, 2020  8:56 AM. If you have any questions, ask your nurse or doctor.        ALPRAZolam 0.5 MG tablet Commonly known as: Xanax Take half tablet to one tablet by mouth as needed daily for insomnia, anxiety.   amLODipine 10 MG tablet Commonly known as: NORVASC Take 1 tablet (10 mg total) by mouth daily.   Contour Next Test test strip Generic drug: glucose blood USE  1 STRIP TO TEST BLOOD  SUGAR TWICE DAILY   famotidine 20 MG tablet Commonly known as: Pepcid Take 1 tablet (20 mg total) by mouth 2 (two) times daily.   FreeStyle Libre 2 Sensor Misc Scan glucose at least QID   hydrochlorothiazide 25 MG tablet Commonly known as: HYDRODIURIL Take 25 mg by mouth daily.   insulin lispro 100 UNIT/ML KwikPen Commonly known as: HumaLOG KwikPen Inject 10 units into the skin 10 minutes prior to lunch & dinner. Hold if glucose is less than 150. What changed: how much to take   Lantus SoloStar 100 UNIT/ML Solostar Pen Generic drug: insulin glargine INJECT SUBCUTANEOUSLY 50  UNITS DAILY What changed: See the new instructions.   [name] Take 5 mg by mouth every evening.   metFORMIN 500 MG 24 hr tablet Commonly known as: Glucophage XR Start 500mg  PO qpm for one week. Then the next week, take 1000mg  qhs.   metoprolol succinate 100 MG 24 hr tablet Commonly known as: TOPROL-XL TAKE 1 TABLET BY MOUTH  DAILY WITH OR IMMEDIATELY  FOLLOWING A MEAL   metoprolol succinate 25 MG 24 hr tablet Commonly known as: TOPROL-XL Take 1 tablet (25 mg) by mouth once daily at bedtime   MIRALAX PO Take 238 g by mouth once. Colonoscopy prep   ondansetron 4 MG disintegrating tablet Commonly known as: Zofran ODT Take 1 tablet (4 mg total) by mouth every 8 (eight) hours as  needed for nausea or vomiting.   onetouch ultrasoft lancets Used to check blood sugars twice daily.   Pen Needles 32G X 6 MM Misc Used to give insulin injections 4 times daily.   sulfamethoxazole-trimethoprim 800-160 MG tablet Commonly known as: Bactrim DS Take 1 tablet by mouth 2 (two) times daily.   telmisartan 40 MG tablet Commonly known as: MICARDIS Take 1 tablet (40 mg total) by mouth daily.   Turmeric 500 MG Caps Take by mouth.   vitamin C 1000 MG tablet Take 1,000 mg by mouth daily.       Allergies: No Known Allergies  Family History: Family History  Problem Relation Age  of Onset  . Arthritis Mother   . Heart disease Mother   . Supraventricular tachycardia Mother        s/p ablation  . Hypertension Mother   . Diabetes Father   . Heart attack Maternal Uncle   . Throat cancer Maternal Uncle   . Esophageal cancer Maternal Uncle   . Heart attack Maternal Uncle   . Heart attack Maternal Uncle   . Prostate cancer Neg Hx   . Thyroid cancer Neg Hx   . Colon cancer Neg Hx   . Rectal cancer Neg Hx   . Stomach cancer Neg Hx   . Colon polyps Neg Hx     Social History:  reports that he has never smoked. He has never used smokeless tobacco. He reports current alcohol use of about 1.0 standard drink of alcohol per week. He reports that he does not use drugs.   Physical Exam: There were no vitals taken for this visit.  Constitutional:  Alert and oriented, No acute distress. HEENT: [place] AT, moist mucus membranes.  Trachea midline, no masses. Cardiovascular: No clubbing, cyanosis, or edema. Respiratory: Normal respiratory effort, no increased work of breathing. GI: Abdomen is soft, nontender, nondistended, no abdominal masses GU: Phallus without lesions.  Testes descended bilaterally without masses or tenderness.  Prostate 50 g smooth without nodules Skin: No rashes, bruises or suspicious lesions. Neurologic: Grossly intact, no focal deficits, moving all 4 extremities. Psychiatric: Normal mood and affect.   Assessment & Plan:    1.  Prostatitis  Symptoms most likely secondary to noninfectious inflammatory prostatitis  Symptoms are improving  Trial tamsulosin 0.4 mg daily  He was offered a follow-up appointment however indicated he would return as needed  2.  Erectile dysfunction  Potential etiologies include organic risk factors (diabetes, hypertension, hyperlipidemia, beta-blockers), prostatitis and metastatic cancer  He does not desire treatment at this time as he wants to focus on his cancer diagnosis/treatment   Abbie Sons,  MD  [facility] [address], [place], [place] [address] [phone]

## 2020-05-16 ENCOUNTER — Other Ambulatory Visit: Payer: Managed Care, Other (non HMO)

## 2020-05-16 LAB — URINALYSIS, COMPLETE
Bilirubin, UA: NEGATIVE
Glucose, UA: NEGATIVE
Ketones, UA: NEGATIVE
Leukocytes,UA: NEGATIVE
Nitrite, UA: NEGATIVE
Protein,UA: NEGATIVE
RBC, UA: NEGATIVE
Specific Gravity, UA: <1.005 — ABNORMAL LOW (ref 1.005–1.030)
Urobilinogen, Ur: 0.2 mg/dL (ref 0.2–1.0)
pH, UA: 5.5 (ref 5.0–7.5)

## 2020-05-16 LAB — MICROSCOPIC EXAMINATION
Bacteria, UA: NONE SEEN
Epithelial Cells (non renal): NONE SEEN /hpf (ref 0–10)
RBC, Urine: NONE SEEN /hpf (ref 0–2)

## 2020-05-17 ENCOUNTER — Encounter: Payer: Self-pay | Admitting: Gastroenterology

## 2020-05-17 ENCOUNTER — Ambulatory Visit (AMBULATORY_SURGERY_CENTER): Payer: Managed Care, Other (non HMO) | Admitting: Gastroenterology

## 2020-05-17 ENCOUNTER — Other Ambulatory Visit: Payer: Self-pay

## 2020-05-17 VITALS — BP 127/59 | HR 95 | Temp 95.9°F | Resp 23 | Ht 69.0 in | Wt 248.0 lb

## 2020-05-17 DIAGNOSIS — C20 Malignant neoplasm of rectum: Secondary | ICD-10-CM | POA: Diagnosis present

## 2020-05-17 DIAGNOSIS — C19 Malignant neoplasm of rectosigmoid junction: Secondary | ICD-10-CM

## 2020-05-17 MED ORDER — SODIUM CHLORIDE 0.9 % IV SOLN: 500.0000 mL | Freq: Once | INTRAVENOUS | Status: DC

## 2020-05-17 NOTE — Progress Notes (Signed)
Called to room to assist during endoscopic procedure.  Patient ID and intended procedure confirmed with present staff. Received instructions for my participation in the procedure from the performing physician.  

## 2020-05-17 NOTE — Progress Notes (Signed)
Tumor Board Documentation  Dominic [name] was presented by Dr Tasia Catchings at our Tumor Board on 05/16/2020, which included representatives from medical oncology, radiation oncology, internal medicine, navigation, pathology, radiology, surgical, pharmacy, research, palliative care, pulmonology.  Dominic [name] currently presents as a new patient, for [place], for new positive pathology with history of the following treatments: surgical intervention(s), active survellience.  Additionally, we reviewed previous medical and familial history, history of present illness, and recent lab results along with all available histopathologic and imaging studies. The tumor board considered available treatment options and made the following recommendations: Additional screening (Colonoscopy, EUS for Pancreatic Mass)    The following procedures/referrals were also placed: No orders of the defined types were placed in this encounter.   Clinical Trial Status: not discussed   Staging used: To be determined, Clinical Stage  AJCC Staging:       Group: Stage IV Metastatic Colorectal Adenocarcinoma   National site-specific guidelines   were discussed with respect to the case.  Tumor board is a meeting of clinicians from various specialty areas who evaluate and discuss patients for whom a multidisciplinary approach is being considered. Final determinations in the plan of care are those of the provider(s). The responsibility for follow up of recommendations given during tumor board is that of the provider.   Today's extended care, comprehensive team conference, Dominic [name] was not present for the discussion and was not examined.   Multidisciplinary Tumor Board is a multidisciplinary case peer review process.  Decisions discussed in the Multidisciplinary Tumor Board reflect the opinions of the specialists present at the conference without having examined the patient.  Ultimately, treatment and diagnostic decisions rest with the primary  provider(s) and the patient.

## 2020-05-17 NOTE — Op Note (Signed)
[facility] Patient Name: Dominic [name] Procedure Date: 05/17/2020 11:30 AM MRN: [id number] Endoscopist: Mallie Mussel L. Loletha Carrow , MD Age: 49 Referring MD:  Date of Birth: January 19, 1971 Gender: Male Account #: [id number] Procedure:                Colonoscopy Indications:              Colorectal cancer (metastatic lesions on liver                            imaging, Bx c/w colorectal primary) - Patient                            lately feeling incomplete evacuation and need to                            strain with BMs Medicines:                Monitored Anesthesia Care Procedure:                Pre-Anesthesia Assessment:                           - Prior to the procedure, a History and Physical                            was performed, and patient medications and                            allergies were reviewed. The patient's tolerance of                            previous anesthesia was also reviewed. The risks                            and benefits of the procedure and the sedation                            options and risks were discussed with the patient.                            All questions were answered, and informed consent                            was obtained. Prior Anticoagulants: The patient has                            taken no previous anticoagulant or antiplatelet                            agents. ASA Grade Assessment: III - A patient with                            severe systemic disease. After reviewing the risks  and benefits, the patient was deemed in                            satisfactory condition to undergo the procedure.                           After obtaining informed consent, the colonoscope                            was passed under direct vision. Throughout the                            procedure, the patient's blood pressure, pulse, and                            oxygen saturations were monitored continuously. The                             Colonoscope was introduced through the anus and                            advanced to the the cecum, identified by                            appendiceal orifice and ileocecal valve. The                            colonoscopy was somewhat difficult due to a                            redundant colon. Successful completion of the                            procedure was aided by using manual pressure. The                            patient tolerated the procedure well. The quality                            of the bowel preparation was fair. The ileocecal                            valve, appendiceal orifice, and rectum were                            photographed. Scope In: 11:46:38 AM Scope Out: 12:04:06 PM Scope Withdrawal Time: 0 hours 12 minutes 36 seconds  Total Procedure Duration: 0 hours 17 minutes 28 seconds  Findings:                 The digital rectal exam findings include palpable                            rectal mass.  A fungating and infiltrative partially obstructing                            large mass was found in the proximal rectum and in                            the mid rectum. The mass was partially                            circumferential (involving two-thirds of the lumen                            circumference), but the adult colonoscope was able                            to easily pass. The mass measured approximately                            seven cm in length (from rectosigmoid junction to                            mid rectum, with the distal aspect just above the                            lowest rectal fold. No bleeding was present.                            Biopsies were taken with a cold forceps for                            histology.                           Retroflexion in the rectum was not performed due to                            the mass.                           The exam was  otherwise without abnormality. Complications:            No immediate complications. Estimated Blood Loss:     Estimated blood loss was minimal. Impression:               - Preparation of the colon was fair. Additional                            lavage performed.                           - Palpable rectal mass found on digital rectal exam.                           - Malignant partially obstructing tumor in the  proximal rectum and in the mid rectum. Biopsied.                           - The examination was otherwise normal. Recommendation:           - Patient has a contact number available for                            emergencies. The signs and symptoms of potential                            delayed complications were discussed with the                            patient. Return to normal activities tomorrow.                            Written discharge instructions were provided to the                            patient.                           - Resume previous diet.                           - Continue present medications.                           - Await pathology results.                           - No recommendation at this time regarding repeat                            colonoscopy.                           - Miralax 1 capful (17 grams) in 8 ounces of water                            PO daily. Decrease to half capsul if stool too                            loose.                           - Further plans for systemic therapy per oncology.                            Results will be forwarded to them. Naila Elizondo L. Loletha Carrow, MD 05/17/2020 12:17:04 PM This report has been signed electronically. CC Letter to:             Earlie Server, MD (Oncology)

## 2020-05-17 NOTE — Patient Instructions (Signed)
Miralax 1 capful ( 17gms) in 8 ounces of water by mouth daily.  Decrease to 1/2 capful if stool too loose.      U HAD AN ENDOSCOPIC PROCEDURE TODAY AT [facility]:   Refer to the procedure report that was given to you for any specific questions about what was found during the examination.  If the procedure report does not answer your questions, please call your gastroenterologist to clarify.  If you requested that your care partner not be given the details of your procedure findings, then the procedure report has been included in a sealed envelope for you to review at your convenience later.  YOU SHOULD EXPECT: Some feelings of bloating in the abdomen. Passage of more gas than usual.  Walking can help get rid of the air that was put into your GI tract during the procedure and reduce the bloating. If you had a lower endoscopy (such as a colonoscopy or flexible sigmoidoscopy) you may notice spotting of blood in your stool or on the toilet paper. If you underwent a bowel prep for your procedure, you may not have a normal bowel movement for a few days.  Please Note:  You might notice some irritation and congestion in your nose or some drainage.  This is from the oxygen used during your procedure.  There is no need for concern and it should clear up in a day or so.  SYMPTOMS TO REPORT IMMEDIATELY:   Following lower endoscopy (colonoscopy or flexible sigmoidoscopy):  Excessive amounts of blood in the stool  Significant tenderness or worsening of abdominal pains  Swelling of the abdomen that is new, acute  Fever of 100F or higher   For urgent or emergent issues, a gastroenterologist can be reached at any hour by calling [phone]. Do not use MyChart messaging for urgent concerns.    DIET:  We do recommend a small meal at first, but then you may proceed to your regular diet.  Drink plenty of fluids but you should avoid alcoholic beverages for 24 hours.  ACTIVITY:  You should  plan to take it easy for the rest of today and you should NOT DRIVE or use heavy machinery until tomorrow (because of the sedation medicines used during the test).    FOLLOW UP: Our staff will call the number listed on your records 48-72 hours following your procedure to check on you and address any questions or concerns that you may have regarding the information given to you following your procedure. If we do not reach you, we will leave a message.  We will attempt to reach you two times.  During this call, we will ask if you have developed any symptoms of COVID 19. If you develop any symptoms (ie: fever, flu-like symptoms, shortness of breath, cough etc.) before then, please call [phone].  If you test positive for Covid 19 in the 2 weeks post procedure, please call and report this information to [place].    If any biopsies were taken you will be contacted by phone or by letter within the next 1-3 weeks.  Please call us at [phone] if you have not heard about the biopsies in 3 weeks.    SIGNATURES/CONFIDENTIALITY: You and/or your care partner have signed paperwork which will be entered into your electronic medical record.  These signatures attest to the fact that that the information above on your After Visit Summary has been reviewed and is understood.  Full responsibility of the confidentiality of  this discharge information lies with you and/or your care-partner.

## 2020-05-19 ENCOUNTER — Encounter: Payer: Self-pay | Admitting: Oncology

## 2020-05-19 DIAGNOSIS — Z7189 Other specified counseling: Secondary | ICD-10-CM | POA: Insufficient documentation

## 2020-05-20 LAB — SURGICAL PATHOLOGY

## 2020-05-21 ENCOUNTER — Other Ambulatory Visit
Admission: RE | Admit: 2020-05-21 | Discharge: 2020-05-21 | Disposition: A | Discharge status: Home / Self Care | Payer: Managed Care, Other (non HMO) | Source: Ambulatory Visit | Attending: Oncology | Admitting: Oncology

## 2020-05-21 ENCOUNTER — Telehealth: Payer: Self-pay

## 2020-05-21 ENCOUNTER — Other Ambulatory Visit: Payer: Self-pay

## 2020-05-21 DIAGNOSIS — Z01812 Encounter for preprocedural laboratory examination: Secondary | ICD-10-CM | POA: Insufficient documentation

## 2020-05-21 DIAGNOSIS — Z20822 Contact with and (suspected) exposure to covid-19: Secondary | ICD-10-CM | POA: Diagnosis not present

## 2020-05-21 NOTE — Telephone Encounter (Signed)
  Follow up Call-  Call back number 05/17/2020 04/25/2020  Post procedure Call Back phone  # [phone]  Permission to leave phone message Yes Yes     Patient questions:  Do you have a fever, pain , or abdominal swelling? No. Pain Score  0 *  Have you tolerated food without any problems? Yes.    Have you been able to return to your normal activities? Yes.    Do you have any questions about your discharge instructions: Diet   No. Medications  No. Follow up visit  No.  Do you have questions or concerns about your Care? No.  Actions: * If pain score is 4 or above: No action needed, pain <4. 1. Have you developed a fever since your procedure? no  2.   Have you had an respiratory symptoms (SOB or cough) since your procedure? no  3.   Have you tested positive for COVID 19 since your procedure no  4.   Have you had any family members/close contacts diagnosed with the COVID 19 since your procedure?  no   If yes to any of these questions please route to Joylene John, RN and Joella Prince, RN

## 2020-05-22 LAB — SARS CORONAVIRUS 2 (TAT 6-24 HRS): SARS Coronavirus 2: NEGATIVE

## 2020-05-23 ENCOUNTER — Encounter
Admission: RE | Disposition: A | Discharge status: Home / Self Care | Payer: Self-pay | Source: Ambulatory Visit | Attending: Gastroenterology

## 2020-05-23 ENCOUNTER — Ambulatory Visit
Admission: RE | Admit: 2020-05-23 | Discharge: 2020-05-23 | Disposition: A | Discharge status: Home / Self Care | Payer: Managed Care, Other (non HMO) | Source: Ambulatory Visit | Attending: Gastroenterology | Admitting: Gastroenterology

## 2020-05-23 ENCOUNTER — Other Ambulatory Visit: Payer: Managed Care, Other (non HMO)

## 2020-05-23 ENCOUNTER — Encounter: Payer: Self-pay | Admitting: Gastroenterology

## 2020-05-23 ENCOUNTER — Ambulatory Visit: Payer: Managed Care, Other (non HMO) | Admitting: Anesthesiology

## 2020-05-23 ENCOUNTER — Other Ambulatory Visit: Payer: Self-pay

## 2020-05-23 DIAGNOSIS — C7A8 Other malignant neuroendocrine tumors: Secondary | ICD-10-CM | POA: Insufficient documentation

## 2020-05-23 DIAGNOSIS — K869 Disease of pancreas, unspecified: Secondary | ICD-10-CM | POA: Insufficient documentation

## 2020-05-23 DIAGNOSIS — C787 Secondary malignant neoplasm of liver and intrahepatic bile duct: Secondary | ICD-10-CM | POA: Diagnosis not present

## 2020-05-23 DIAGNOSIS — C19 Malignant neoplasm of rectosigmoid junction: Secondary | ICD-10-CM | POA: Insufficient documentation

## 2020-05-23 DIAGNOSIS — R933 Abnormal findings on diagnostic imaging of other parts of digestive tract: Secondary | ICD-10-CM | POA: Diagnosis not present

## 2020-05-23 HISTORY — PX: EUS: SHX5427

## 2020-05-23 LAB — GLUCOSE, CAPILLARY: Glucose-Capillary: 144 mg/dL — ABNORMAL HIGH (ref 70–99)

## 2020-05-23 SURGERY — ULTRASOUND, UPPER GI TRACT, ENDOSCOPIC
Anesthesia: General

## 2020-05-23 MED ORDER — GLYCOPYRROLATE 0.2 MG/ML IJ SOLN
INTRAMUSCULAR | Status: DC | PRN
  Administered 2020-05-23: .2 mg via INTRAVENOUS

## 2020-05-23 MED ORDER — SODIUM CHLORIDE 0.9 % IV SOLN: INTRAVENOUS | Status: DC

## 2020-05-23 MED ORDER — PROPOFOL 500 MG/50ML IV EMUL
INTRAVENOUS | Status: DC | PRN
  Administered 2020-05-23: 150 ug/kg/min via INTRAVENOUS

## 2020-05-23 MED ORDER — LIDOCAINE HCL (CARDIAC) PF 100 MG/5ML IV SOSY
PREFILLED_SYRINGE | INTRAVENOUS | Status: DC | PRN
  Administered 2020-05-23: 100 mg via INTRAVENOUS

## 2020-05-23 MED ORDER — EPHEDRINE SULFATE 50 MG/ML IJ SOLN
INTRAMUSCULAR | Status: DC | PRN
  Administered 2020-05-23: 10 mg via INTRAVENOUS

## 2020-05-23 MED ORDER — PROPOFOL 10 MG/ML IV BOLUS
INTRAVENOUS | Status: DC | PRN
  Administered 2020-05-23: 10 mg via INTRAVENOUS
  Administered 2020-05-23: 60 mg via INTRAVENOUS
  Administered 2020-05-23: 10 mg via INTRAVENOUS

## 2020-05-23 NOTE — Anesthesia Procedure Notes (Signed)
Procedure Name: General with mask airway Performed by: Fletcher-Harrison, Swayze Pries, CRNA Pre-anesthesia Checklist: Patient identified, Emergency Drugs available, Suction available and Patient being monitored Patient Re-evaluated:Patient Re-evaluated prior to induction Oxygen Delivery Method: Circle system utilized Induction Type: IV induction Placement Confirmation: positive ETCO2 and CO2 detector Dental Injury: Teeth and Oropharynx as per pre-operative assessment        

## 2020-05-23 NOTE — Transfer of Care (Signed)
Immediate Anesthesia Transfer of Care Note  Patient: Dominic [name]  Procedure(s) Performed: FULL UPPER ENDOSCOPIC ULTRASOUND (EUS) RADIAL (N/A )  Patient Location: Endoscopy Unit  Anesthesia Type:General  Level of Consciousness: drowsy and patient cooperative  Airway & Oxygen Therapy: Patient Spontanous Breathing  Post-op Assessment: Report given to RN and Post -op Vital signs reviewed and stable  Post vital signs: Reviewed and stable  Last Vitals:  Vitals Value Taken Time  BP 102/48 05/23/20 1428  Temp    Pulse 83 05/23/20 1429  Resp 23 05/23/20 1429  SpO2 99 % 05/23/20 1429  Vitals shown include unvalidated device data.  Last Pain:  Vitals:   05/23/20 1427  TempSrc: (P) Temporal  PainSc:          Complications: No complications documented.

## 2020-05-23 NOTE — Brief Op Note (Signed)
FNA of pancreas tail mass sent with [facility]

## 2020-05-23 NOTE — Anesthesia Postprocedure Evaluation (Signed)
Anesthesia Post Note  Patient: Dominic [name]  Procedure(s) Performed: FULL UPPER ENDOSCOPIC ULTRASOUND (EUS) RADIAL (N/A )  Patient location during evaluation: Endoscopy Anesthesia Type: General Level of consciousness: awake and awake and alert Pain management: pain level controlled Vital Signs Assessment: post-procedure vital signs reviewed and stable Respiratory status: spontaneous breathing and nonlabored ventilation Cardiovascular status: blood pressure returned to baseline and stable Postop Assessment: no apparent nausea or vomiting Anesthetic complications: no   No complications documented.   Last Vitals:  Vitals:   05/23/20 1258 05/23/20 1427  BP: 127/78   Pulse: 91   Resp: 18   Temp: 36.6 C (!) 36.2 C  SpO2: 99%     Last Pain:  Vitals:   05/23/20 1427  TempSrc: Temporal  PainSc: Asleep                 Neva Seat

## 2020-05-23 NOTE — Interval H&P Note (Signed)
History and Physical Interval Note:  05/23/2020 1:36 PM  Dominic [name]  has presented today for surgery, with the diagnosis of pancreas tail mass.  The various methods of treatment have been discussed with the patient and family. After consideration of risks, benefits and other options for treatment, the patient has consented to  Procedure(s): FULL UPPER ENDOSCOPIC ULTRASOUND (EUS) RADIAL (N/A) EUS will be using linear scope first and only radial if needed as a surgical intervention.  The patient's history has been reviewed, patient examined, no change in status, stable for surgery.  I have reviewed the patient's chart and labs.  Questions were answered to the patient's satisfaction.    Patient pain is at 1/10 Abd soft mild epig and RUQ tenderness.  Zada Girt

## 2020-05-23 NOTE — Anesthesia Preprocedure Evaluation (Signed)
Anesthesia Evaluation    Airway Mallampati: II       Dental no notable dental hx.    Pulmonary    Pulmonary exam normal breath sounds clear to auscultation       Cardiovascular hypertension, + angina Normal cardiovascular exam Rhythm:Regular Rate:Normal     Neuro/Psych    GI/Hepatic GERD  ,  Endo/Other  diabetes  Renal/GU      Musculoskeletal  (+) Arthritis ,   Abdominal   Peds  Hematology   Anesthesia Other Findings .Marland KitchenPast Medical History: No date: Cancer ([facility]) No date: Diabetes mellitus without complication ([facility]) No date: GERD (gastroesophageal reflux disease) No date: High triglycerides No date: Hypertension No date: Long-term insulin use in type 2 diabetes ([facility]) No date: Morbid obesity ([facility]) No date: Psoriatic arthritis ([facility]) No date: SVT (supraventricular tachycardia) ([facility])   Reproductive/Obstetrics                             Anesthesia Physical Anesthesia Plan  ASA: III  Anesthesia Plan: General   Post-op Pain Management:    Induction: Intravenous  PONV Risk Score and Plan: 2 and Propofol infusion  Airway Management Planned: Nasal Cannula  Additional Equipment:   Intra-op Plan:   Post-operative Plan:   Informed Consent: I have reviewed the patients History and Physical, chart, labs and discussed the procedure including the risks, benefits and alternatives for the proposed anesthesia with the patient or authorized representative who has indicated his/her understanding and acceptance.       Plan Discussed with: CRNA, Anesthesiologist and Surgeon  Anesthesia Plan Comments:         Anesthesia Quick Evaluation

## 2020-05-23 NOTE — Op Note (Signed)
[facility] Gastroenterology Patient Name: Dominic [name] Procedure Date: 05/23/2020 1:46 PM MRN: [id number] Account #: [id number] Date of Birth: 1970-11-10 Admit Type: Outpatient Age: 49 Room: [facility] ENDO ROOM 3 Gender: Male Note Status: Finalized Procedure:             Upper EUS Indications:           Suspected mass in pancreas on CT scan, Epigastric                         abdominal pain; recently diagnosed metastatic                         colorectal cancer with PT lesion seen in staging MRI. Providers:             Zada Girt Referring MD:          Yvetta Coder. Arnett (Referring MD) Medicines:             Monitored Anesthesia Care Complications:         No immediate complications. Procedure:             Pre-Anesthesia Assessment:                        - Please see pre-anesthesia assessment documentation                         already completed in Epic.                        After obtaining informed consent, the endoscope was                         passed under direct vision. Throughout the procedure,                         the patient's blood pressure, pulse, and oxygen                         saturations were monitored continuously. The EUS scope                         was introduced through the mouth, and advanced to the                         duodenum for ultrasound examination from the stomach                         and duodenum. The Endoscope was introduced through the                         mouth, and advanced to the second part of duodenum.                         The upper EUS was accomplished without difficulty. The                         patient tolerated the procedure well. Findings:      ENDOSCOPIC FINDING: :      The examined esophagus was endoscopically normal.  The entire examined stomach was endoscopically normal except for some       small fundic gland polyps.      The examined duodenum was endoscopically normal.       ENDOSONOGRAPHIC FINDING: :      An oval mass was identified in the pancreatic tail. The mass was       hypoechoic. The mass measured 19 mm by 8 mm in maximal cross-sectional       diameter. The endosonographic borders were well-defined. An intact       interface was seen between the mass and the adjacent structures       suggesting a lack of invasion. The remainder of the pancreas was       examined. The endosonographic appearance of parenchyma and the upstream       pancreatic duct indicated duct dilation, a maximum duct diameter of 2.5       mm and parenchymal atrophy. Fine needle biopsy was performed. Color       Doppler imaging was utilized prior to needle puncture to confirm a lack       of significant vascular structures within the needle path. Three passes       were made with the 25 gauge SharkCore biopsy needle using a transgastric       approach. A preliminary cytologic examination was performed. Final       cytology results are pending. The PD measured 1.3 mm in the body and 1       mm in the head.      Pancreatic parenchymal abnormalities were noted in the genu of the       pancreas and pancreatic body. These consisted of hyperechoic strands and       contiguous lobularity.      There was no sign of significant endosonographic abnormality in the       common bile duct and in the common hepatic duct. The maximum diameter of       the ducts were 3 mm.      No abnormal-appearing lymph nodes were seen during endosonographic       examination in the peripancreatic region. Celiac region not well seen.      Multiple lesions found in the left lobe of the liver. The lesions were       heterogenous. A representative lesion measured 35 mm by 29 mm in maximal       cross-sectional diameter. Impression:            - Normal esophagus.                        - Normal stomach except for some small fundic gland                         polyps.                        - Normal examined  duodenum.                        EUS:                        - A mass was identified in the pancreatic tail. Tissue  was obtained from this exam, and results are pending.                         However, the endosonographic appearance is suggestive                         of adenocarcinoma although broad differential                         including other tumors such as neuroendocrine or less                         likely and inflammatory mass. Fine needle biopsy                         performed.                        - Pancreatic parenchymal abnormalities consisting of                         hyperechoic strands and lobularity were noted in the                         genu of the pancreas and pancreatic body.                        - There was no sign of significant pathology in the                         common bile duct and in the common hepatic duct.                        - A lesion was found in the left lobe of the liver.                         The lesion was heterogenous. The lesion measured 35 mm                         by 29 mm. Consistent with known metastatic lesion from                         colorectal cancer. Recommendation:        - Await cytology results.                        - Patient has a contact number available for                         emergencies. The signs and symptoms of potential                         delayed complications were discussed with the patient.                         Return to normal activities tomorrow. Written  discharge instructions were provided to the patient.                        - Return to referring physician.                        - The findings and recommendations were discussed with                         the patient. Procedure Code(s):     --- Professional ---                        [phone], Esophagogastroduodenoscopy, flexible,                         transoral; with  transendoscopic ultrasound-guided                         intramural or transmural fine needle                         aspiration/biopsy(s), (includes endoscopic ultrasound                         examination limited to the esophagus, stomach or                         duodenum, and adjacent structures) Diagnosis Code(s):     --- Professional ---                        K86.89, Other specified diseases of pancreas                        K86.9, Disease of pancreas, unspecified                        K76.9, Liver disease, unspecified                        R10.13, Epigastric pain                        R93.3, Abnormal findings on diagnostic imaging of                         other parts of digestive tract CPT copyright 2019 [employer]. All rights reserved. The codes documented in this report are preliminary and upon coder review may  be revised to meet current compliance requirements. Attending Participation:      I personally performed the entire procedure. Zada Girt,  05/23/2020 2:37:49 PM This report has been signed electronically. Number of Addenda: 0 Note Initiated On: 05/23/2020 1:46 PM Estimated Blood Loss:  Estimated blood loss was minimal.      [facility]

## 2020-05-24 ENCOUNTER — Telehealth: Payer: Self-pay

## 2020-05-24 ENCOUNTER — Telehealth (INDEPENDENT_AMBULATORY_CARE_PROVIDER_SITE_OTHER): Payer: Self-pay

## 2020-05-24 ENCOUNTER — Other Ambulatory Visit: Payer: Self-pay

## 2020-05-24 DIAGNOSIS — C19 Malignant neoplasm of rectosigmoid junction: Secondary | ICD-10-CM

## 2020-05-24 NOTE — Telephone Encounter (Signed)
Called and reviewed all upcoming appointments and what to expect during each. Offered appointment with Dr. Tasia Catchings to review pathology prior to chemo start and he prefers to wait until chemo start date. He will have port inserted for his regimen. I will call him once this has been arranged. All questions answered and I have encouraged him to call with any questions.

## 2020-05-24 NOTE — Progress Notes (Signed)
Tumor Board Documentation  Dominic [name] was presented by Dr Francella Solian at our Tumor Board on 05/23/2020, which included representatives from medical oncology, radiation oncology, internal medicine, navigation, pathology, radiology, surgical, pharmacy, genetics, research, palliative care, pulmonology.  Dominic [name] currently presents as a new patient, for discussion with history of the following treatments: active survellience, surgical intervention(s).  Additionally, we reviewed previous medical and familial history, history of present illness, and recent lab results along with all available histopathologic and imaging studies. The tumor board considered available treatment options and made the following recommendations: Biopsy (EUS biopsy of Pancreatic mass)    The following procedures/referrals were also placed: No orders of the defined types were placed in this encounter.   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  Tumor board is a meeting of clinicians from various specialty areas who evaluate and discuss patients for whom a multidisciplinary approach is being considered. Final determinations in the plan of care are those of the provider(s). The responsibility for follow up of recommendations given during tumor board is that of the provider.   Today's extended care, comprehensive team conference, Killian was not present for the discussion and was not examined.   Multidisciplinary Tumor Board is a multidisciplinary case peer review process.  Decisions discussed in the Multidisciplinary Tumor Board reflect the opinions of the specialists present at the conference without having examined the patient.  Ultimately, treatment and diagnostic decisions rest with the primary provider(s) and the patient.

## 2020-05-24 NOTE — Telephone Encounter (Signed)
Dominic Jacks, RN  Dominic [name], Dominic [name], we have this patient starting chemo on 11/22. Can he get a port put in on 11/18. If not any date I can hopefully work with!  Thanks  Azzie Almas, Mickel [name], CMA  Dominic Jacks, RN Hi Steffanie Dunn,  I have the patient for Dr. Lucky Cowboy on 05/30/20 with a 7:45 am arrival to the [facility]. NPO after 12:00 midnight, stop Metformin 24 hours before and 48 after procedure, half Insulin Dose and can have one person with him. Covid testing on 05/28/20 between 8-1 pm at the [place]. Pre-procedure instructions will be mailed to the patient. Can you please call the patient with this information please.  Thank you,  Mickel [name]

## 2020-05-24 NOTE — Telephone Encounter (Signed)
Call and notified Dominic [name] that he is scheduled with Dr. Lucky Cowboy on 05/30/20 with a 7:45 am arrival to the [place]. Do ot eat or drink anything after 12:00 midnight, stop Metformin 24 hours before and 48 after procedure, half Insulin Dose and can have one person with him. Covid testing on 05/28/20 between 8-1 pm at the [place]. Pre-procedure instructions will also be mailed to him. All questions answered. Encouraged to call with any needs.

## 2020-05-25 ENCOUNTER — Other Ambulatory Visit: Payer: Self-pay | Admitting: Family

## 2020-05-25 DIAGNOSIS — I10 Essential (primary) hypertension: Secondary | ICD-10-CM

## 2020-05-25 DIAGNOSIS — K219 Gastro-esophageal reflux disease without esophagitis: Secondary | ICD-10-CM

## 2020-05-27 ENCOUNTER — Other Ambulatory Visit: Payer: Self-pay

## 2020-05-27 ENCOUNTER — Other Ambulatory Visit: Payer: Self-pay | Admitting: Oncology

## 2020-05-27 ENCOUNTER — Telehealth: Payer: Self-pay

## 2020-05-27 DIAGNOSIS — C7951 Secondary malignant neoplasm of bone: Secondary | ICD-10-CM

## 2020-05-27 DIAGNOSIS — C20 Malignant neoplasm of rectum: Secondary | ICD-10-CM | POA: Insufficient documentation

## 2020-05-27 LAB — CYTOLOGY - NON PAP

## 2020-05-27 MED ORDER — LIDOCAINE-PRILOCAINE 2.5-2.5 % EX CREA: TOPICAL_CREAM | CUTANEOUS | 3 refills | Status: DC

## 2020-05-27 MED ORDER — ONDANSETRON HCL 8 MG PO TABS: 8.0000 mg | ORAL_TABLET | Freq: Two times a day (BID) | ORAL | 1 refills | Status: AC | PRN

## 2020-05-27 MED ORDER — ONDANSETRON HCL 8 MG PO TABS: 8.0000 mg | ORAL_TABLET | Freq: Two times a day (BID) | ORAL | 1 refills | Status: DC | PRN

## 2020-05-27 MED ORDER — LIDOCAINE-PRILOCAINE 2.5-2.5 % EX CREA: TOPICAL_CREAM | CUTANEOUS | 3 refills | Status: AC

## 2020-05-27 MED ORDER — PROCHLORPERAZINE MALEATE 10 MG PO TABS: 10.0000 mg | ORAL_TABLET | Freq: Four times a day (QID) | ORAL | 1 refills | Status: DC | PRN

## 2020-05-27 NOTE — Progress Notes (Signed)
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 

## 2020-05-27 NOTE — Telephone Encounter (Signed)
Contacted [facility] and cancelled prescriptions sent: lidocaine cream, zofran & compazine. Prescriptions re-sent to local pharmacy: [facility]. Patient notified.

## 2020-05-27 NOTE — Telephone Encounter (Signed)
Please update scheduling notes to reflect correct treatment. Message sent to [facility] to notify her of treatment plan change.

## 2020-05-27 NOTE — Telephone Encounter (Signed)
05/27/2020 Called pt and confirmed changes to appts, lab/MD/Chemo now on 11/22. Pt confirmed this would work for him and he will be there  SRW

## 2020-05-27 NOTE — Patient Instructions (Signed)
Irinotecan injection What is this medicine? IRINOTECAN (ir in oh TEE kan ) is a chemotherapy drug. It is used to treat colon and rectal cancer. This medicine may be used for other purposes; ask your health care provider or pharmacist if you have questions. COMMON BRAND NAME(S): Camptosar What should I tell my health care provider before I take this medicine? They need to know if you have any of these conditions:  dehydration  diarrhea  infection (especially a virus infection such as chickenpox, cold sores, or herpes)  liver disease  low blood counts, like low white cell, platelet, or red cell counts  low levels of calcium, magnesium, or potassium in the blood  recent or ongoing radiation therapy  an unusual or allergic reaction to irinotecan, other medicines, foods, dyes, or preservatives  pregnant or trying to get pregnant  breast-feeding How should I use this medicine? This drug is given as an infusion into a vein. It is administered in a hospital or clinic by a specially trained health care professional. Talk to your pediatrician regarding the use of this medicine in children. Special care may be needed. Overdosage: If you think you have taken too much of this medicine contact a poison control center or emergency room at once. NOTE: This medicine is only for you. Do not share this medicine with others. What if I miss a dose? It is important not to miss your dose. Call your doctor or health care professional if you are unable to keep an appointment. What may interact with this medicine? This medicine may interact with the following medications:  antiviral medicines for HIV or AIDS  certain antibiotics like rifampin or rifabutin  certain medicines for fungal infections like itraconazole, ketoconazole, posaconazole, and voriconazole  certain medicines for seizures like carbamazepine, phenobarbital, phenotoin  clarithromycin  gemfibrozil  nefazodone  [facility]  Wort This list may not describe all possible interactions. Give your health care provider a list of all the medicines, herbs, non-prescription drugs, or dietary supplements you use. Also tell them if you smoke, drink alcohol, or use illegal drugs. Some items may interact with your medicine. What should I watch for while using this medicine? Your condition will be monitored carefully while you are receiving this medicine. You will need important blood work done while you are taking this medicine. This drug may make you feel generally unwell. This is not uncommon, as chemotherapy can affect healthy cells as well as cancer cells. Report any side effects. Continue your course of treatment even though you feel ill unless your doctor tells you to stop. In some cases, you may be given additional medicines to help with side effects. Follow all directions for their use. You may get drowsy or dizzy. Do not drive, use machinery, or do anything that needs mental alertness until you know how this medicine affects you. Do not stand or sit up quickly, especially if you are an older patient. This reduces the risk of dizzy or fainting spells. Call your health care professional for advice if you get a fever, chills, or sore throat, or other symptoms of a cold or flu. Do not treat yourself. This medicine decreases your body's ability to fight infections. Try to avoid being around people who are sick. Avoid taking products that contain aspirin, acetaminophen, ibuprofen, naproxen, or ketoprofen unless instructed by your doctor. These medicines may hide a fever. This medicine may increase your risk to bruise or bleed. Call your doctor or health care professional if you notice  any unusual bleeding. Be careful brushing and flossing your teeth or using a toothpick because you may get an infection or bleed more easily. If you have any dental work done, tell your dentist you are receiving this medicine. Do not become pregnant while  taking this medicine or for 6 months after stopping it. Women should inform their health care professional if they wish to become pregnant or think they might be pregnant. Men should not father a child while taking this medicine and for 3 months after stopping it. There is potential for serious side effects to an unborn child. Talk to your health care professional for more information. Do not breast-feed an infant while taking this medicine or for 7 days after stopping it. This medicine has caused ovarian failure in some women. This medicine may make it more difficult to get pregnant. Talk to your health care professional if you are concerned about your fertility. This medicine has caused decreased sperm counts in some men. This may make it more difficult to father a child. Talk to your health care professional if you are concerned about your fertility. What side effects may I notice from receiving this medicine? Side effects that you should report to your doctor or health care professional as soon as possible:  allergic reactions like skin rash, itching or hives, swelling of the face, lips, or tongue  chest pain  diarrhea  flushing, runny nose, sweating during infusion  low blood counts - this medicine may decrease the number of white blood cells, red blood cells and platelets. You may be at increased risk for infections and bleeding.  nausea, vomiting  pain, swelling, warmth in the leg  signs of decreased platelets or bleeding - bruising, pinpoint red spots on the skin, black, tarry stools, blood in the urine  signs of infection - fever or chills, cough, sore throat, pain or difficulty passing urine  signs of decreased red blood cells - unusually weak or tired, fainting spells, lightheadedness Side effects that usually do not require medical attention (report to your doctor or health care professional if they continue or are bothersome):  constipation  hair loss  headache  loss of  appetite  mouth sores  stomach pain This list may not describe all possible side effects. Call your doctor for medical advice about side effects. You may report side effects to FDA at 1-800-FDA-1088. Where should I keep my medicine? This drug is given in a hospital or clinic and will not be stored at home. NOTE: This sheet is a summary. It may not cover all possible information. If you have questions about this medicine, talk to your doctor, pharmacist, or health care provider.  2020 Else[employer]/Gold Standard (2018-08-19 10:09:17) Oxaliplatin Injection What is this medicine? OXALIPLATIN (ox AL i PLA tin) is a chemotherapy drug. It targets fast dividing cells, like cancer cells, and causes these cells to die. This medicine is used to treat cancers of the colon and rectum, and many other cancers. This medicine may be used for other purposes; ask your health care provider or pharmacist if you have questions. COMMON BRAND NAME(S): Eloxatin What should I tell my health care provider before I take this medicine? They need to know if you have any of these conditions:  heart disease  history of irregular heartbeat  liver disease  low blood counts, like white cells, platelets, or red blood cells  lung or breathing disease, like asthma  take medicines that treat or prevent blood clots  tingling of  the fingers or toes, or other nerve disorder  an unusual or allergic reaction to oxaliplatin, other chemotherapy, other medicines, foods, dyes, or preservatives  pregnant or trying to get pregnant  breast-feeding How should I use this medicine? This drug is given as an infusion into a vein. It is administered in a hospital or clinic by a specially trained health care professional. Talk to your pediatrician regarding the use of this medicine in children. Special care may be needed. Overdosage: If you think you have taken too much of this medicine contact a poison control center or emergency room  at once. NOTE: This medicine is only for you. Do not share this medicine with others. What if I miss a dose? It is important not to miss a dose. Call your doctor or health care professional if you are unable to keep an appointment. What may interact with this medicine? Do not take this medicine with any of the following medications:  cisapride  dronedarone  pimozide  thioridazine This medicine may also interact with the following medications:  aspirin and aspirin-like medicines  certain medicines that treat or prevent blood clots like warfarin, apixaban, dabigatran, and rivaroxaban  cisplatin  cyclosporine  diuretics  medicines for infection like acyclovir, adefovir, amphotericin B, bacitracin, cidofovir, foscarnet, ganciclovir, gentamicin, pentamidine, vancomycin  NSAIDs, medicines for pain and inflammation, like ibuprofen or naproxen  other medicines that prolong the QT interval (an abnormal heart rhythm)  pamidronate  zoledronic acid This list may not describe all possible interactions. Give your health care provider a list of all the medicines, herbs, non-prescription drugs, or dietary supplements you use. Also tell them if you smoke, drink alcohol, or use illegal drugs. Some items may interact with your medicine. What should I watch for while using this medicine? Your condition will be monitored carefully while you are receiving this medicine. You may need blood work done while you are taking this medicine. This medicine may make you feel generally unwell. This is not uncommon as chemotherapy can affect healthy cells as well as cancer cells. Report any side effects. Continue your course of treatment even though you feel ill unless your healthcare professional tells you to stop. This medicine can make you more sensitive to cold. Do not drink cold drinks or use ice. Cover exposed skin before coming in contact with cold temperatures or cold objects. When out in cold weather  wear warm clothing and cover your mouth and nose to warm the air that goes into your lungs. Tell your doctor if you get sensitive to the cold. Do not become pregnant while taking this medicine or for 9 months after stopping it. Women should inform their health care professional if they wish to become pregnant or think they might be pregnant. Men should not father a child while taking this medicine and for 6 months after stopping it. There is potential for serious side effects to an unborn child. Talk to your health care professional for more information. Do not breast-feed a child while taking this medicine or for 3 months after stopping it. This medicine has caused ovarian failure in some women. This medicine may make it more difficult to get pregnant. Talk to your health care professional if you are concerned about your fertility. This medicine has caused decreased sperm counts in some men. This may make it more difficult to father a child. Talk to your health care professional if you are concerned about your fertility. This medicine may increase your risk of getting an  infection. Call your health care professional for advice if you get a fever, chills, or sore throat, or other symptoms of a cold or flu. Do not treat yourself. Try to avoid being around people who are sick. Avoid taking medicines that contain aspirin, acetaminophen, ibuprofen, naproxen, or ketoprofen unless instructed by your health care professional. These medicines may hide a fever. Be careful brushing or flossing your teeth or using a toothpick because you may get an infection or bleed more easily. If you have any dental work done, tell your dentist you are receiving this medicine. What side effects may I notice from receiving this medicine? Side effects that you should report to your doctor or health care professional as soon as possible:  allergic reactions like skin rash, itching or hives, swelling of the face, lips, or  tongue  breathing problems  cough  low blood counts - this medicine may decrease the number of white blood cells, red blood cells, and platelets. You may be at increased risk for infections and bleeding  nausea, vomiting  pain, redness, or irritation at site where injected  pain, tingling, numbness in the hands or feet  signs and symptoms of bleeding such as bloody or black, tarry stools; red or dark brown urine; spitting up blood or brown material that looks like coffee grounds; red spots on the skin; unusual bruising or bleeding from the eyes, gums, or nose  signs and symptoms of a dangerous change in heartbeat or heart rhythm like chest pain; dizziness; fast, irregular heartbeat; palpitations; feeling faint or lightheaded; falls  signs and symptoms of infection like fever; chills; cough; sore throat; pain or trouble passing urine  signs and symptoms of liver injury like dark yellow or brown urine; general ill feeling or flu-like symptoms; light-colored stools; loss of appetite; nausea; right upper belly pain; unusually weak or tired; yellowing of the eyes or skin  signs and symptoms of low red blood cells or anemia such as unusually weak or tired; feeling faint or lightheaded; falls  signs and symptoms of muscle injury like dark urine; trouble passing urine or change in the amount of urine; unusually weak or tired; muscle pain; back pain Side effects that usually do not require medical attention (report to your doctor or health care professional if they continue or are bothersome):  changes in taste  diarrhea  gas  hair loss  loss of appetite  mouth sores This list may not describe all possible side effects. Call your doctor for medical advice about side effects. You may report side effects to FDA at 1-800-FDA-1088. Where should I keep my medicine? This drug is given in a hospital or clinic and will not be stored at home. NOTE: This sheet is a summary. It may not cover all  possible information. If you have questions about this medicine, talk to your doctor, pharmacist, or health care provider.  2020 [employer]/Gold Standard (2018-11-16 12:20:35) Fluorouracil, 5-FU injection What is this medicine? FLUOROURACIL, 5-FU (flure oh YOOR a sil) is a chemotherapy drug. It slows the growth of cancer cells. This medicine is used to treat many types of cancer like breast cancer, colon or rectal cancer, pancreatic cancer, and stomach cancer. This medicine may be used for other purposes; ask your health care provider or pharmacist if you have questions. COMMON BRAND NAME(S): Adrucil What should I tell my health care provider before I take this medicine? They need to know if you have any of these conditions:  blood disorders  dihydropyrimidine dehydrogenase (  DPD) deficiency  infection (especially a virus infection such as chickenpox, cold sores, or herpes)  kidney disease  liver disease  malnourished, poor nutrition  recent or ongoing radiation therapy  an unusual or allergic reaction to fluorouracil, other chemotherapy, other medicines, foods, dyes, or preservatives  pregnant or trying to get pregnant  breast-feeding How should I use this medicine? This drug is given as an infusion or injection into a vein. It is administered in a hospital or clinic by a specially trained health care professional. Talk to your pediatrician regarding the use of this medicine in children. Special care may be needed. Overdosage: If you think you have taken too much of this medicine contact a poison control center or emergency room at once. NOTE: This medicine is only for you. Do not share this medicine with others. What if I miss a dose? It is important not to miss your dose. Call your doctor or health care professional if you are unable to keep an appointment. What may interact with this  medicine?  allopurinol  cimetidine  dapsone  digoxin  hydroxyurea  leucovorin  levamisole  medicines for seizures like ethotoin, fosphenytoin, phenytoin  medicines to increase blood counts like filgrastim, pegfilgrastim, sargramostim  medicines that treat or prevent blood clots like warfarin, enoxaparin, and dalteparin  methotrexate  metronidazole  pyrimethamine  some other chemotherapy drugs like busulfan, cisplatin, estramustine, vinblastine  trimethoprim  trimetrexate  vaccines Talk to your doctor or health care professional before taking any of these medicines:  acetaminophen  aspirin  ibuprofen  ketoprofen  naproxen This list may not describe all possible interactions. Give your health care provider a list of all the medicines, herbs, non-prescription drugs, or dietary supplements you use. Also tell them if you smoke, drink alcohol, or use illegal drugs. Some items may interact with your medicine. What should I watch for while using this medicine? Visit your doctor for checks on your progress. This drug may make you feel generally unwell. This is not uncommon, as chemotherapy can affect healthy cells as well as cancer cells. Report any side effects. Continue your course of treatment even though you feel ill unless your doctor tells you to stop. In some cases, you may be given additional medicines to help with side effects. Follow all directions for their use. Call your doctor or health care professional for advice if you get a fever, chills or sore throat, or other symptoms of a cold or flu. Do not treat yourself. This drug decreases your body's ability to fight infections. Try to avoid being around people who are sick. This medicine may increase your risk to bruise or bleed. Call your doctor or health care professional if you notice any unusual bleeding. Be careful brushing and flossing your teeth or using a toothpick because you may get an infection or bleed  more easily. If you have any dental work done, tell your dentist you are receiving this medicine. Avoid taking products that contain aspirin, acetaminophen, ibuprofen, naproxen, or ketoprofen unless instructed by your doctor. These medicines may hide a fever. Do not become pregnant while taking this medicine. Women should inform their doctor if they wish to become pregnant or think they might be pregnant. There is a potential for serious side effects to an unborn child. Talk to your health care professional or pharmacist for more information. Do not breast-feed an infant while taking this medicine. Men should inform their doctor if they wish to father a child. This medicine may lower sperm  counts. Do not treat diarrhea with over the counter products. Contact your doctor if you have diarrhea that lasts more than 2 days or if it is severe and watery. This medicine can make you more sensitive to the sun. Keep out of the sun. If you cannot avoid being in the sun, wear protective clothing and use sunscreen. Do not use sun lamps or tanning beds/booths. What side effects may I notice from receiving this medicine? Side effects that you should report to your doctor or health care professional as soon as possible:  allergic reactions like skin rash, itching or hives, swelling of the face, lips, or tongue  low blood counts - this medicine may decrease the number of white blood cells, red blood cells and platelets. You may be at increased risk for infections and bleeding.  signs of infection - fever or chills, cough, sore throat, pain or difficulty passing urine  signs of decreased platelets or bleeding - bruising, pinpoint red spots on the skin, black, tarry stools, blood in the urine  signs of decreased red blood cells - unusually weak or tired, fainting spells, lightheadedness  breathing problems  changes in vision  chest pain  mouth sores  nausea and vomiting  pain, swelling, redness at site  where injected  pain, tingling, numbness in the hands or feet  redness, swelling, or sores on hands or feet  stomach pain  unusual bleeding Side effects that usually do not require medical attention (report to your doctor or health care professional if they continue or are bothersome):  changes in finger or toe nails  diarrhea  dry or itchy skin  hair loss  headache  loss of appetite  sensitivity of eyes to the light  stomach upset  unusually teary eyes This list may not describe all possible side effects. Call your doctor for medical advice about side effects. You may report side effects to FDA at 1-800-FDA-1088. Where should I keep my medicine? This drug is given in a hospital or clinic and will not be stored at home. NOTE: This sheet is a summary. It may not cover all possible information. If you have questions about this medicine, talk to your doctor, pharmacist, or health care provider.  2020 Elsevier/Gold Standard (2007-11-02 13:53:16) Leucovorin injection What is this medicine? LEUCOVORIN (loo koe VOR in) is used to prevent or treat the harmful effects of some medicines. This medicine is used to treat anemia caused by a low amount of folic acid in the body. It is also used with 5-fluorouracil (5-FU) to treat colon cancer. This medicine may be used for other purposes; ask your health care provider or pharmacist if you have questions. What should I tell my health care provider before I take this medicine? They need to know if you have any of these conditions:  anemia from low levels of vitamin B-12 in the blood  an unusual or allergic reaction to leucovorin, folic acid, other medicines, foods, dyes, or preservatives  pregnant or trying to get pregnant  breast-feeding How should I use this medicine? This medicine is for injection into a muscle or into a vein. It is given by a health care professional in a hospital or clinic setting. Talk to your pediatrician  regarding the use of this medicine in children. Special care may be needed. Overdosage: If you think you have taken too much of this medicine contact a poison control center or emergency room at once. NOTE: This medicine is only for you. Do not share  this medicine with others. What if I miss a dose? This does not apply. What may interact with this medicine?  capecitabine  fluorouracil  phenobarbital  phenytoin  primidone  trimethoprim-sulfamethoxazole This list may not describe all possible interactions. Give your health care provider a list of all the medicines, herbs, non-prescription drugs, or dietary supplements you use. Also tell them if you smoke, drink alcohol, or use illegal drugs. Some items may interact with your medicine. What should I watch for while using this medicine? Your condition will be monitored carefully while you are receiving this medicine. This medicine may increase the side effects of 5-fluorouracil, 5-FU. Tell your doctor or health care professional if you have diarrhea or mouth sores that do not get better or that get worse. What side effects may I notice from receiving this medicine? Side effects that you should report to your doctor or health care professional as soon as possible:  allergic reactions like skin rash, itching or hives, swelling of the face, lips, or tongue  breathing problems  fever, infection  mouth sores  unusual bleeding or bruising  unusually weak or tired Side effects that usually do not require medical attention (report to your doctor or health care professional if they continue or are bothersome):  constipation or diarrhea  loss of appetite  nausea, vomiting This list may not describe all possible side effects. Call your doctor for medical advice about side effects. You may report side effects to FDA at 1-800-FDA-1088. Where should I keep my medicine? This drug is given in a hospital or clinic and will not be stored at  home. NOTE: This sheet is a summary. It may not cover all possible information. If you have questions about this medicine, talk to your doctor, pharmacist, or health care provider.  2020 [employer] Standard (2008-01-03 16:50:29) Bevacizumab injection What is this medicine? BEVACIZUMAB (be va SIZ yoo mab) is a monoclonal antibody. It is used to treat many types of cancer. This medicine may be used for other purposes; ask your health care provider or pharmacist if you have questions. COMMON BRAND NAME(S): Avastin, MVASI, Zirabev What should I tell my health care provider before I take this medicine? They need to know if you have any of these conditions:  diabetes  heart disease  high blood pressure  history of coughing up blood  prior anthracycline chemotherapy (e.g., doxorubicin, daunorubicin, epirubicin)  recent or ongoing radiation therapy  recent or planning to have surgery  stroke  an unusual or allergic reaction to bevacizumab, hamster proteins, mouse proteins, other medicines, foods, dyes, or preservatives  pregnant or trying to get pregnant  breast-feeding How should I use this medicine? This medicine is for infusion into a vein. It is given by a health care professional in a hospital or clinic setting. Talk to your pediatrician regarding the use of this medicine in children. Special care may be needed. Overdosage: If you think you have taken too much of this medicine contact a poison control center or emergency room at once. NOTE: This medicine is only for you. Do not share this medicine with others. What if I miss a dose? It is important not to miss your dose. Call your doctor or health care professional if you are unable to keep an appointment. What may interact with this medicine? Interactions are not expected. This list may not describe all possible interactions. Give your health care provider a list of all the medicines, herbs, non-prescription drugs, or  dietary supplements you use.  Also tell them if you smoke, drink alcohol, or use illegal drugs. Some items may interact with your medicine. What should I watch for while using this medicine? Your condition will be monitored carefully while you are receiving this medicine. You will need important blood work and urine testing done while you are taking this medicine. This medicine may increase your risk to bruise or bleed. Call your doctor or health care professional if you notice any unusual bleeding. Before having surgery, talk to your health care provider to make sure it is ok. This drug can increase the risk of poor healing of your surgical site or wound. You will need to stop this drug for 28 days before surgery. After surgery, wait at least 28 days before restarting this drug. Make sure the surgical site or wound is healed enough before restarting this drug. Talk to your health care provider if questions. Do not become pregnant while taking this medicine or for 6 months after stopping it. Women should inform their doctor if they wish to become pregnant or think they might be pregnant. There is a potential for serious side effects to an unborn child. Talk to your health care professional or pharmacist for more information. Do not breast-feed an infant while taking this medicine and for 6 months after the last dose. This medicine has caused ovarian failure in some women. This medicine may interfere with the ability to have a child. You should talk to your doctor or health care professional if you are concerned about your fertility. What side effects may I notice from receiving this medicine? Side effects that you should report to your doctor or health care professional as soon as possible:  allergic reactions like skin rash, itching or hives, swelling of the face, lips, or tongue  chest pain or chest tightness  chills  coughing up blood  high fever  seizures  severe constipation  signs and  symptoms of bleeding such as bloody or black, tarry stools; red or dark-brown urine; spitting up blood or brown material that looks like coffee grounds; red spots on the skin; unusual bruising or bleeding from the eye, gums, or nose  signs and symptoms of a blood clot such as breathing problems; chest pain; severe, sudden headache; pain, swelling, warmth in the leg  signs and symptoms of a stroke like changes in vision; confusion; trouble speaking or understanding; severe headaches; sudden numbness or weakness of the face, arm or leg; trouble walking; dizziness; loss of balance or coordination  stomach pain  sweating  swelling of legs or ankles  vomiting  weight gain Side effects that usually do not require medical attention (report to your doctor or health care professional if they continue or are bothersome):  back pain  changes in taste  decreased appetite  dry skin  nausea  tiredness This list may not describe all possible side effects. Call your doctor for medical advice about side effects. You may report side effects to FDA at 1-800-FDA-1088. Where should I keep my medicine? This drug is given in a hospital or clinic and will not be stored at home. NOTE: This sheet is a summary. It may not cover all possible information. If you have questions about this medicine, talk to your doctor, pharmacist, or health care provider.  2020 [employer] Standard (2019-04-26 10:50:46) Oxaliplatin Injection What is this medicine? OXALIPLATIN (ox AL i PLA tin) is a chemotherapy drug. It targets fast dividing cells, like cancer cells, and causes these cells to die. This  medicine is used to treat cancers of the colon and rectum, and many other cancers. This medicine may be used for other purposes; ask your health care provider or pharmacist if you have questions. COMMON BRAND NAME(S): Eloxatin What should I tell my health care provider before I take this medicine? They need to know if you  have any of these conditions:  heart disease  history of irregular heartbeat  liver disease  low blood counts, like white cells, platelets, or red blood cells  lung or breathing disease, like asthma  take medicines that treat or prevent blood clots  tingling of the fingers or toes, or other nerve disorder  an unusual or allergic reaction to oxaliplatin, other chemotherapy, other medicines, foods, dyes, or preservatives  pregnant or trying to get pregnant  breast-feeding How should I use this medicine? This drug is given as an infusion into a vein. It is administered in a hospital or clinic by a specially trained health care professional. Talk to your pediatrician regarding the use of this medicine in children. Special care may be needed. Overdosage: If you think you have taken too much of this medicine contact a poison control center or emergency room at once. NOTE: This medicine is only for you. Do not share this medicine with others. What if I miss a dose? It is important not to miss a dose. Call your doctor or health care professional if you are unable to keep an appointment. What may interact with this medicine? Do not take this medicine with any of the following medications:  cisapride  dronedarone  pimozide  thioridazine This medicine may also interact with the following medications:  aspirin and aspirin-like medicines  certain medicines that treat or prevent blood clots like warfarin, apixaban, dabigatran, and rivaroxaban  cisplatin  cyclosporine  diuretics  medicines for infection like acyclovir, adefovir, amphotericin B, bacitracin, cidofovir, foscarnet, ganciclovir, gentamicin, pentamidine, vancomycin  NSAIDs, medicines for pain and inflammation, like ibuprofen or naproxen  other medicines that prolong the QT interval (an abnormal heart rhythm)  pamidronate  zoledronic acid This list may not describe all possible interactions. Give your health care  provider a list of all the medicines, herbs, non-prescription drugs, or dietary supplements you use. Also tell them if you smoke, drink alcohol, or use illegal drugs. Some items may interact with your medicine. What should I watch for while using this medicine? Your condition will be monitored carefully while you are receiving this medicine. You may need blood work done while you are taking this medicine. This medicine may make you feel generally unwell. This is not uncommon as chemotherapy can affect healthy cells as well as cancer cells. Report any side effects. Continue your course of treatment even though you feel ill unless your healthcare professional tells you to stop. This medicine can make you more sensitive to cold. Do not drink cold drinks or use ice. Cover exposed skin before coming in contact with cold temperatures or cold objects. When out in cold weather wear warm clothing and cover your mouth and nose to warm the air that goes into your lungs. Tell your doctor if you get sensitive to the cold. Do not become pregnant while taking this medicine or for 9 months after stopping it. Women should inform their health care professional if they wish to become pregnant or think they might be pregnant. Men should not father a child while taking this medicine and for 6 months after stopping it. There is potential for serious side  effects to an unborn child. Talk to your health care professional for more information. Do not breast-feed a child while taking this medicine or for 3 months after stopping it. This medicine has caused ovarian failure in some women. This medicine may make it more difficult to get pregnant. Talk to your health care professional if you are concerned about your fertility. This medicine has caused decreased sperm counts in some men. This may make it more difficult to father a child. Talk to your health care professional if you are concerned about your fertility. This medicine may  increase your risk of getting an infection. Call your health care professional for advice if you get a fever, chills, or sore throat, or other symptoms of a cold or flu. Do not treat yourself. Try to avoid being around people who are sick. Avoid taking medicines that contain aspirin, acetaminophen, ibuprofen, naproxen, or ketoprofen unless instructed by your health care professional. These medicines may hide a fever. Be careful brushing or flossing your teeth or using a toothpick because you may get an infection or bleed more easily. If you have any dental work done, tell your dentist you are receiving this medicine. What side effects may I notice from receiving this medicine? Side effects that you should report to your doctor or health care professional as soon as possible:  allergic reactions like skin rash, itching or hives, swelling of the face, lips, or tongue  breathing problems  cough  low blood counts - this medicine may decrease the number of white blood cells, red blood cells, and platelets. You may be at increased risk for infections and bleeding  nausea, vomiting  pain, redness, or irritation at site where injected  pain, tingling, numbness in the hands or feet  signs and symptoms of bleeding such as bloody or black, tarry stools; red or dark brown urine; spitting up blood or brown material that looks like coffee grounds; red spots on the skin; unusual bruising or bleeding from the eyes, gums, or nose  signs and symptoms of a dangerous change in heartbeat or heart rhythm like chest pain; dizziness; fast, irregular heartbeat; palpitations; feeling faint or lightheaded; falls  signs and symptoms of infection like fever; chills; cough; sore throat; pain or trouble passing urine  signs and symptoms of liver injury like dark yellow or brown urine; general ill feeling or flu-like symptoms; light-colored stools; loss of appetite; nausea; right upper belly pain; unusually weak or tired;  yellowing of the eyes or skin  signs and symptoms of low red blood cells or anemia such as unusually weak or tired; feeling faint or lightheaded; falls  signs and symptoms of muscle injury like dark urine; trouble passing urine or change in the amount of urine; unusually weak or tired; muscle pain; back pain Side effects that usually do not require medical attention (report to your doctor or health care professional if they continue or are bothersome):  changes in taste  diarrhea  gas  hair loss  loss of appetite  mouth sores This list may not describe all possible side effects. Call your doctor for medical advice about side effects. You may report side effects to FDA at 1-800-FDA-1088. Where should I keep my medicine? This drug is given in a hospital or clinic and will not be stored at home. NOTE: This sheet is a summary. It may not cover all possible information. If you have questions about this medicine, talk to your doctor, pharmacist, or health care provider.  2020  [employer] Standard (2018-11-16 12:20:35) Fluorouracil, 5-FU injection What is this medicine? FLUOROURACIL, 5-FU (flure oh YOOR a sil) is a chemotherapy drug. It slows the growth of cancer cells. This medicine is used to treat many types of cancer like breast cancer, colon or rectal cancer, pancreatic cancer, and stomach cancer. This medicine may be used for other purposes; ask your health care provider or pharmacist if you have questions. COMMON BRAND NAME(S): Adrucil What should I tell my health care provider before I take this medicine? They need to know if you have any of these conditions:  blood disorders  dihydropyrimidine dehydrogenase (DPD) deficiency  infection (especially a virus infection such as chickenpox, cold sores, or herpes)  kidney disease  liver disease  malnourished, poor nutrition  recent or ongoing radiation therapy  an unusual or allergic reaction to fluorouracil, other  chemotherapy, other medicines, foods, dyes, or preservatives  pregnant or trying to get pregnant  breast-feeding How should I use this medicine? This drug is given as an infusion or injection into a vein. It is administered in a hospital or clinic by a specially trained health care professional. Talk to your pediatrician regarding the use of this medicine in children. Special care may be needed. Overdosage: If you think you have taken too much of this medicine contact a poison control center or emergency room at once. NOTE: This medicine is only for you. Do not share this medicine with others. What if I miss a dose? It is important not to miss your dose. Call your doctor or health care professional if you are unable to keep an appointment. What may interact with this medicine?  allopurinol  cimetidine  dapsone  digoxin  hydroxyurea  leucovorin  levamisole  medicines for seizures like ethotoin, fosphenytoin, phenytoin  medicines to increase blood counts like filgrastim, pegfilgrastim, sargramostim  medicines that treat or prevent blood clots like warfarin, enoxaparin, and dalteparin  methotrexate  metronidazole  pyrimethamine  some other chemotherapy drugs like busulfan, cisplatin, estramustine, vinblastine  trimethoprim  trimetrexate  vaccines Talk to your doctor or health care professional before taking any of these medicines:  acetaminophen  aspirin  ibuprofen  ketoprofen  naproxen This list may not describe all possible interactions. Give your health care provider a list of all the medicines, herbs, non-prescription drugs, or dietary supplements you use. Also tell them if you smoke, drink alcohol, or use illegal drugs. Some items may interact with your medicine. What should I watch for while using this medicine? Visit your doctor for checks on your progress. This drug may make you feel generally unwell. This is not uncommon, as chemotherapy can affect  healthy cells as well as cancer cells. Report any side effects. Continue your course of treatment even though you feel ill unless your doctor tells you to stop. In some cases, you may be given additional medicines to help with side effects. Follow all directions for their use. Call your doctor or health care professional for advice if you get a fever, chills or sore throat, or other symptoms of a cold or flu. Do not treat yourself. This drug decreases your body's ability to fight infections. Try to avoid being around people who are sick. This medicine may increase your risk to bruise or bleed. Call your doctor or health care professional if you notice any unusual bleeding. Be careful brushing and flossing your teeth or using a toothpick because you may get an infection or bleed more easily. If you have any dental work done,  tell your dentist you are receiving this medicine. Avoid taking products that contain aspirin, acetaminophen, ibuprofen, naproxen, or ketoprofen unless instructed by your doctor. These medicines may hide a fever. Do not become pregnant while taking this medicine. Women should inform their doctor if they wish to become pregnant or think they might be pregnant. There is a potential for serious side effects to an unborn child. Talk to your health care professional or pharmacist for more information. Do not breast-feed an infant while taking this medicine. Men should inform their doctor if they wish to father a child. This medicine may lower sperm counts. Do not treat diarrhea with over the counter products. Contact your doctor if you have diarrhea that lasts more than 2 days or if it is severe and watery. This medicine can make you more sensitive to the sun. Keep out of the sun. If you cannot avoid being in the sun, wear protective clothing and use sunscreen. Do not use sun lamps or tanning beds/booths. What side effects may I notice from receiving this medicine? Side effects that you  should report to your doctor or health care professional as soon as possible:  allergic reactions like skin rash, itching or hives, swelling of the face, lips, or tongue  low blood counts - this medicine may decrease the number of white blood cells, red blood cells and platelets. You may be at increased risk for infections and bleeding.  signs of infection - fever or chills, cough, sore throat, pain or difficulty passing urine  signs of decreased platelets or bleeding - bruising, pinpoint red spots on the skin, black, tarry stools, blood in the urine  signs of decreased red blood cells - unusually weak or tired, fainting spells, lightheadedness  breathing problems  changes in vision  chest pain  mouth sores  nausea and vomiting  pain, swelling, redness at site where injected  pain, tingling, numbness in the hands or feet  redness, swelling, or sores on hands or feet  stomach pain  unusual bleeding Side effects that usually do not require medical attention (report to your doctor or health care professional if they continue or are bothersome):  changes in finger or toe nails  diarrhea  dry or itchy skin  hair loss  headache  loss of appetite  sensitivity of eyes to the light  stomach upset  unusually teary eyes This list may not describe all possible side effects. Call your doctor for medical advice about side effects. You may report side effects to FDA at 1-800-FDA-1088. Where should I keep my medicine? This drug is given in a hospital or clinic and will not be stored at home. NOTE: This sheet is a summary. It may not cover all possible information. If you have questions about this medicine, talk to your doctor, pharmacist, or health care provider.  2020 [employer]/Gold Standard (2007-11-02 13:53:16) Leucovorin injection What is this medicine? LEUCOVORIN (loo koe VOR in) is used to prevent or treat the harmful effects of some medicines. This medicine is used to  treat anemia caused by a low amount of folic acid in the body. It is also used with 5-fluorouracil (5-FU) to treat colon cancer. This medicine may be used for other purposes; ask your health care provider or pharmacist if you have questions. What should I tell my health care provider before I take this medicine? They need to know if you have any of these conditions:  anemia from low levels of vitamin B-12 in the blood  an  unusual or allergic reaction to leucovorin, folic acid, other medicines, foods, dyes, or preservatives  pregnant or trying to get pregnant  breast-feeding How should I use this medicine? This medicine is for injection into a muscle or into a vein. It is given by a health care professional in a hospital or clinic setting. Talk to your pediatrician regarding the use of this medicine in children. Special care may be needed. Overdosage: If you think you have taken too much of this medicine contact a poison control center or emergency room at once. NOTE: This medicine is only for you. Do not share this medicine with others. What if I miss a dose? This does not apply. What may interact with this medicine?  capecitabine  fluorouracil  phenobarbital  phenytoin  primidone  trimethoprim-sulfamethoxazole This list may not describe all possible interactions. Give your health care provider a list of all the medicines, herbs, non-prescription drugs, or dietary supplements you use. Also tell them if you smoke, drink alcohol, or use illegal drugs. Some items may interact with your medicine. What should I watch for while using this medicine? Your condition will be monitored carefully while you are receiving this medicine. This medicine may increase the side effects of 5-fluorouracil, 5-FU. Tell your doctor or health care professional if you have diarrhea or mouth sores that do not get better or that get worse. What side effects may I notice from receiving this medicine? Side  effects that you should report to your doctor or health care professional as soon as possible:  allergic reactions like skin rash, itching or hives, swelling of the face, lips, or tongue  breathing problems  fever, infection  mouth sores  unusual bleeding or bruising  unusually weak or tired Side effects that usually do not require medical attention (report to your doctor or health care professional if they continue or are bothersome):  constipation or diarrhea  loss of appetite  nausea, vomiting This list may not describe all possible side effects. Call your doctor for medical advice about side effects. You may report side effects to FDA at 1-800-FDA-1088. Where should I keep my medicine? This drug is given in a hospital or clinic and will not be stored at home. NOTE: This sheet is a summary. It may not cover all possible information. If you have questions about this medicine, talk to your doctor, pharmacist, or health care provider.  2020 [employer] Standard (2008-01-03 16:50:29)

## 2020-05-28 ENCOUNTER — Inpatient Hospital Stay: Payer: Managed Care, Other (non HMO) | Attending: Oncology

## 2020-05-28 ENCOUNTER — Other Ambulatory Visit: Payer: Self-pay

## 2020-05-28 ENCOUNTER — Other Ambulatory Visit
Admission: RE | Admit: 2020-05-28 | Discharge: 2020-05-28 | Disposition: A | Discharge status: Home / Self Care | Payer: Managed Care, Other (non HMO) | Source: Ambulatory Visit | Attending: Vascular Surgery | Admitting: Vascular Surgery

## 2020-05-28 DIAGNOSIS — E1165 Type 2 diabetes mellitus with hyperglycemia: Secondary | ICD-10-CM | POA: Diagnosis not present

## 2020-05-28 DIAGNOSIS — Z5111 Encounter for antineoplastic chemotherapy: Secondary | ICD-10-CM | POA: Insufficient documentation

## 2020-05-28 DIAGNOSIS — Z5112 Encounter for antineoplastic immunotherapy: Secondary | ICD-10-CM | POA: Insufficient documentation

## 2020-05-28 DIAGNOSIS — F419 Anxiety disorder, unspecified: Secondary | ICD-10-CM | POA: Insufficient documentation

## 2020-05-28 DIAGNOSIS — Z794 Long term (current) use of insulin: Secondary | ICD-10-CM | POA: Insufficient documentation

## 2020-05-28 DIAGNOSIS — C7A098 Malignant carcinoid tumors of other sites: Secondary | ICD-10-CM | POA: Diagnosis not present

## 2020-05-28 DIAGNOSIS — Z20822 Contact with and (suspected) exposure to covid-19: Secondary | ICD-10-CM | POA: Insufficient documentation

## 2020-05-28 DIAGNOSIS — I10 Essential (primary) hypertension: Secondary | ICD-10-CM | POA: Insufficient documentation

## 2020-05-28 DIAGNOSIS — C20 Malignant neoplasm of rectum: Secondary | ICD-10-CM | POA: Diagnosis present

## 2020-05-28 DIAGNOSIS — Z01812 Encounter for preprocedural laboratory examination: Secondary | ICD-10-CM | POA: Insufficient documentation

## 2020-05-28 DIAGNOSIS — C787 Secondary malignant neoplasm of liver and intrahepatic bile duct: Secondary | ICD-10-CM | POA: Insufficient documentation

## 2020-05-28 DIAGNOSIS — Z801 Family history of malignant neoplasm of trachea, bronchus and lung: Secondary | ICD-10-CM | POA: Diagnosis not present

## 2020-05-28 DIAGNOSIS — Z79899 Other long term (current) drug therapy: Secondary | ICD-10-CM | POA: Diagnosis not present

## 2020-05-28 DIAGNOSIS — K219 Gastro-esophageal reflux disease without esophagitis: Secondary | ICD-10-CM | POA: Diagnosis not present

## 2020-05-28 NOTE — Telephone Encounter (Signed)
Done... Appts has been scheduled and updated as requested per MD.

## 2020-05-29 LAB — SARS CORONAVIRUS 2 (TAT 6-24 HRS): SARS Coronavirus 2: NEGATIVE

## 2020-05-30 ENCOUNTER — Telehealth: Payer: Managed Care, Other (non HMO)

## 2020-05-30 ENCOUNTER — Other Ambulatory Visit (INDEPENDENT_AMBULATORY_CARE_PROVIDER_SITE_OTHER): Payer: Self-pay | Admitting: Nurse Practitioner

## 2020-05-30 ENCOUNTER — Encounter
Admission: RE | Disposition: A | Discharge status: Home / Self Care | Payer: Self-pay | Source: Home / Self Care | Attending: Vascular Surgery

## 2020-05-30 ENCOUNTER — Encounter: Payer: Self-pay | Admitting: Vascular Surgery

## 2020-05-30 ENCOUNTER — Other Ambulatory Visit: Payer: Self-pay

## 2020-05-30 ENCOUNTER — Ambulatory Visit
Admission: RE | Admit: 2020-05-30 | Discharge: 2020-05-30 | Disposition: A | Discharge status: Home / Self Care | Payer: Managed Care, Other (non HMO) | Attending: Vascular Surgery | Admitting: Vascular Surgery

## 2020-05-30 DIAGNOSIS — C19 Malignant neoplasm of rectosigmoid junction: Secondary | ICD-10-CM | POA: Insufficient documentation

## 2020-05-30 DIAGNOSIS — K869 Disease of pancreas, unspecified: Secondary | ICD-10-CM | POA: Diagnosis not present

## 2020-05-30 DIAGNOSIS — R55 Syncope and collapse: Secondary | ICD-10-CM | POA: Insufficient documentation

## 2020-05-30 DIAGNOSIS — C189 Malignant neoplasm of colon, unspecified: Secondary | ICD-10-CM

## 2020-05-30 DIAGNOSIS — C20 Malignant neoplasm of rectum: Secondary | ICD-10-CM

## 2020-05-30 HISTORY — DX: Anxiety disorder, unspecified: F41.9

## 2020-05-30 HISTORY — PX: PORTA CATH INSERTION: CATH118285

## 2020-05-30 HISTORY — DX: Cardiac arrhythmia, unspecified: I49.9

## 2020-05-30 SURGERY — PORTA CATH INSERTION
Anesthesia: Moderate Sedation

## 2020-05-30 MED ORDER — MIDAZOLAM HCL 2 MG/2ML IJ SOLN
INTRAMUSCULAR | Status: AC
  Filled 2020-05-30: qty 2

## 2020-05-30 MED ORDER — FENTANYL CITRATE (PF) 100 MCG/2ML IJ SOLN
INTRAMUSCULAR | Status: DC | PRN
  Administered 2020-05-30: 50 ug via INTRAVENOUS

## 2020-05-30 MED ORDER — CHLORHEXIDINE GLUCONATE CLOTH 2 % EX PADS
6.0000 | MEDICATED_PAD | Freq: Every day | CUTANEOUS | Status: DC
  Administered 2020-05-30: 6 via TOPICAL

## 2020-05-30 MED ORDER — DIPHENHYDRAMINE HCL 50 MG/ML IJ SOLN: 50.0000 mg | Freq: Once | INTRAMUSCULAR | Status: DC | PRN

## 2020-05-30 MED ORDER — CEFAZOLIN SODIUM-DEXTROSE 2-4 GM/100ML-% IV SOLN
INTRAVENOUS | Status: AC
  Filled 2020-05-30: qty 100

## 2020-05-30 MED ORDER — METHYLPREDNISOLONE SODIUM SUCC 125 MG IJ SOLR: 125.0000 mg | Freq: Once | INTRAMUSCULAR | Status: DC | PRN

## 2020-05-30 MED ORDER — MIDAZOLAM HCL 2 MG/2ML IJ SOLN
INTRAMUSCULAR | Status: DC | PRN
  Administered 2020-05-30: 2 mg via INTRAVENOUS

## 2020-05-30 MED ORDER — FENTANYL CITRATE (PF) 100 MCG/2ML IJ SOLN
INTRAMUSCULAR | Status: AC
  Filled 2020-05-30: qty 2

## 2020-05-30 MED ORDER — CHLORHEXIDINE GLUCONATE CLOTH 2 % EX PADS
6.0000 | MEDICATED_PAD | Freq: Every day | CUTANEOUS | Status: DC
Start: 2020-05-30 — End: 2020-05-30

## 2020-05-30 MED ORDER — SODIUM CHLORIDE 0.9 % IV SOLN: INTRAVENOUS | Status: DC

## 2020-05-30 MED ORDER — CEFAZOLIN SODIUM-DEXTROSE 2-4 GM/100ML-% IV SOLN: 2.0000 g | Freq: Once | INTRAVENOUS | Status: DC

## 2020-05-30 MED ORDER — FAMOTIDINE 20 MG PO TABS: 40.0000 mg | ORAL_TABLET | Freq: Once | ORAL | Status: DC | PRN

## 2020-05-30 MED ORDER — ONDANSETRON HCL 4 MG/2ML IJ SOLN: 4.0000 mg | Freq: Four times a day (QID) | INTRAMUSCULAR | Status: DC | PRN

## 2020-05-30 MED ORDER — SODIUM CHLORIDE 0.9 % IV SOLN
Freq: Once | INTRAVENOUS | Status: DC
  Filled 2020-05-30: qty 2

## 2020-05-30 MED ORDER — MIDAZOLAM HCL 2 MG/ML PO SYRP: 8.0000 mg | ORAL_SOLUTION | Freq: Once | ORAL | Status: DC | PRN

## 2020-05-30 MED ORDER — HYDROMORPHONE HCL 1 MG/ML IJ SOLN: 1.0000 mg | Freq: Once | INTRAMUSCULAR | Status: DC | PRN

## 2020-05-30 SURGICAL SUPPLY — 10 items
COVER EZ STRL 42X30 (DRAPES) ×2 IMPLANT
COVER SURGICAL LIGHT HANDLE (MISCELLANEOUS) ×2 IMPLANT
DERMABOND ADVANCED (GAUZE/BANDAGES/DRESSINGS) ×1
DERMABOND ADVANCED .7 DNX12 (GAUZE/BANDAGES/DRESSINGS) ×1 IMPLANT
KIT PORT POWER 8FR ISP CVUE (Port) ×2 IMPLANT
PACK ANGIOGRAPHY (CUSTOM PROCEDURE TRAY) ×2 IMPLANT
SPONGE XRAY 4X4 16PLY STRL (MISCELLANEOUS) ×2 IMPLANT
SUT MNCRL AB 4-0 PS2 18 (SUTURE) ×2 IMPLANT
SUT VIC AB 3-0 SH 27 (SUTURE) ×1
SUT VIC AB 3-0 SH 27X BRD (SUTURE) ×1 IMPLANT

## 2020-05-30 NOTE — Op Note (Signed)
      [facility]       Operative Note  Date: 05/30/2020  Preoperative diagnosis:  1. Rectal cancer  Postoperative diagnosis:  Same as above  Procedures: #1. Ultrasound guidance for vascular access to the right internal jugular vein. #2. Fluoroscopic guidance for placement of catheter. #3. Placement of CT compatible Port-A-Cath, right internal jugular vein.  Surgeon: Leotis Pain, MD.   Anesthesia: Local with moderate conscious sedation for approximately 26  minutes using 2 mg of Versed and 50 mcg of Fentanyl  Fluoroscopy time: less than 1 minute  Contrast used: 0  Estimated blood loss: 5 cc  Indication for the procedure:  The patient is a 49 y.o.male with rectal cancer.  The patient needs a Port-A-Cath for durable venous access, chemotherapy, lab draws, and CT scans. We are asked to place this. Risks and benefits were discussed and informed consent was obtained.  Description of procedure: The patient was brought to the vascular and interventional radiology suite.  Moderate conscious sedation was administered throughout the procedure during a face to face encounter with the patient with my supervision of the RN administering medicines and monitoring the patient's vital signs, pulse oximetry, telemetry and mental status throughout from the start of the procedure until the patient was taken to the recovery room. The right neck chest and shoulder were sterilely prepped and draped, and a sterile surgical field was created. Ultrasound was used to help visualize a patent right internal jugular vein. This was then accessed under direct ultrasound guidance without difficulty with the Seldinger needle and a permanent image was recorded. A J-wire was placed. After skin nick and dilatation, the peel-away sheath was then placed over the wire. I then anesthetized an area under the clavicle approximately 1-2 fingerbreadths. A transverse incision was created and an inferior pocket was  created with electrocautery and blunt dissection. The port was then brought onto the field, placed into the pocket and secured to the chest wall with 2 Prolene sutures. The catheter was connected to the port and tunneled from the subclavicular incision to the access site. Fluoroscopic guidance was then used to cut the catheter to an appropriate length. The catheter was then placed through the peel-away sheath and the peel-away sheath was removed. The catheter tip was parked in excellent location under fluorocoscopic guidance in the cavoatrial junction. The pocket was then irrigated with antibiotic impregnated saline and the wound was closed with a running 3-0 Vicryl and a 4-0 Monocryl. The access incision was closed with a single 4-0 Monocryl. The Huber needle was used to withdraw blood and flush the port with heparinized saline. Dermabond was then placed as a dressing. The patient tolerated the procedure well and was taken to the recovery room in stable condition.   Leotis Pain 05/30/2020 9:38 AM   This note was created with Dragon Medical transcription system. Any errors in dictation are purely unintentional.

## 2020-05-30 NOTE — Discharge Instructions (Signed)
Implanted Port Insertion, Care After °This sheet gives you information about how to care for yourself after your procedure. Your health care provider may also give you more specific instructions. If you have problems or questions, contact your health care provider. °What can I expect after the procedure? °After the procedure, it is common to have: °· Discomfort at the port insertion site. °· Bruising on the skin over the port. This should improve over 3-4 days. °Follow these instructions at home: °Port care °· After your port is placed, you will get a manufacturer's information card. The card has information about your port. Keep this card with you at all times. °· Take care of the port as told by your health care provider. Ask your health care provider if you or a family member can get training for taking care of the port at home. A home health care nurse may also take care of the port. °· Make sure to remember what type of port you have. °Incision care ° °  ° °· Follow instructions from your health care provider about how to take care of your port insertion site. Make sure you: °? Wash your hands with soap and water before and after you change your bandage (dressing). If soap and water are not available, use hand sanitizer. °? Change your dressing as told by your health care provider. °? Leave stitches (sutures), skin glue, or adhesive strips in place. These skin closures may need to stay in place for 2 weeks or longer. If adhesive strip edges start to loosen and curl up, you may trim the loose edges. Do not remove adhesive strips completely unless your health care provider tells you to do that. °· Check your port insertion site every day for signs of infection. Check for: °? Redness, swelling, or pain. °? Fluid or blood. °? Warmth. °? Pus or a bad smell. °Activity °· Return to your normal activities as told by your health care provider. Ask your health care provider what activities are safe for you. °· Do not  lift anything that is heavier than 10 lb (4.5 kg), or the limit that you are told, until your health care provider says that it is safe. °General instructions °· Take over-the-counter and prescription medicines only as told by your health care provider. °· Do not take baths, swim, or use a hot tub until your health care provider approves. Ask your health care provider if you may take showers. You may only be allowed to take sponge baths. °· Do not drive for 24 hours if you were given a sedative during your procedure. °· Wear a medical alert bracelet in case of an emergency. This will tell any health care providers that you have a port. °· Keep all follow-up visits as told by your health care provider. This is important. °Contact a health care provider if: °· You cannot flush your port with saline as directed, or you cannot draw blood from the port. °· You have a fever or chills. °· You have redness, swelling, or pain around your port insertion site. °· You have fluid or blood coming from your port insertion site. °· Your port insertion site feels warm to the touch. °· You have pus or a bad smell coming from the port insertion site. °Get help right away if: °· You have chest pain or shortness of breath. °· You have bleeding from your port that you cannot control. °Summary °· Take care of the port as told by your health   care provider. Keep the manufacturer's information card with you at all times. °· Change your dressing as told by your health care provider. °· Contact a health care provider if you have a fever or chills or if you have redness, swelling, or pain around your port insertion site. °· Keep all follow-up visits as told by your health care provider. °This information is not intended to replace advice given to you by your health care provider. Make sure you discuss any questions you have with your health care provider. °Document Revised: 01/25/2018 Document Reviewed: 01/25/2018 °Elsevier Patient Education ©  2020 [employer] ° °

## 2020-05-30 NOTE — Interval H&P Note (Signed)
History and Physical Interval Note:  05/30/2020 8:06 AM  Dominic [name]  has presented today for surgery, with the diagnosis of Porta Cath Placement   Colon Ca Covid  Nov 16.  The various methods of treatment have been discussed with the patient and family. After consideration of risks, benefits and other options for treatment, the patient has consented to  Procedure(s): PORTA CATH INSERTION (N/A) as a surgical intervention.  The patient's history has been reviewed, patient examined, no change in status, stable for surgery.  I have reviewed the patient's chart and labs.  Questions were answered to the patient's satisfaction.     Leotis Pain

## 2020-05-31 NOTE — Progress Notes (Signed)
Patient prescreened for MD appointment. Pt reports ache behind eyes and feels that ears are clogged up. Pt reports soreness to port site due to recent placement.

## 2020-06-03 ENCOUNTER — Inpatient Hospital Stay: Payer: Managed Care, Other (non HMO)

## 2020-06-03 ENCOUNTER — Inpatient Hospital Stay (HOSPITAL_BASED_OUTPATIENT_CLINIC_OR_DEPARTMENT_OTHER): Payer: Managed Care, Other (non HMO) | Admitting: Oncology

## 2020-06-03 ENCOUNTER — Encounter: Payer: Self-pay | Admitting: Oncology

## 2020-06-03 VITALS — BP 141/86 | HR 98 | Temp 99.2°F | Resp 16 | Wt 243.0 lb

## 2020-06-03 DIAGNOSIS — Z5111 Encounter for antineoplastic chemotherapy: Secondary | ICD-10-CM | POA: Diagnosis not present

## 2020-06-03 DIAGNOSIS — C7951 Secondary malignant neoplasm of bone: Secondary | ICD-10-CM

## 2020-06-03 DIAGNOSIS — Z7189 Other specified counseling: Secondary | ICD-10-CM

## 2020-06-03 DIAGNOSIS — C20 Malignant neoplasm of rectum: Secondary | ICD-10-CM | POA: Diagnosis not present

## 2020-06-03 DIAGNOSIS — C7A8 Other malignant neuroendocrine tumors: Secondary | ICD-10-CM | POA: Diagnosis not present

## 2020-06-03 DIAGNOSIS — E1365 Other specified diabetes mellitus with hyperglycemia: Secondary | ICD-10-CM

## 2020-06-03 DIAGNOSIS — C787 Secondary malignant neoplasm of liver and intrahepatic bile duct: Secondary | ICD-10-CM

## 2020-06-03 DIAGNOSIS — Z5112 Encounter for antineoplastic immunotherapy: Secondary | ICD-10-CM | POA: Diagnosis not present

## 2020-06-03 LAB — URINALYSIS, DIPSTICK ONLY
Bilirubin Urine: NEGATIVE
Glucose, UA: >=500 mg/dL — AB
Hgb urine dipstick: NEGATIVE
Ketones, ur: NEGATIVE mg/dL
Leukocytes,Ua: NEGATIVE
Nitrite: NEGATIVE
Protein, ur: 30 mg/dL — AB
Specific Gravity, Urine: 1.018 (ref 1.005–1.030)
pH: 5 (ref 5.0–8.0)

## 2020-06-03 LAB — COMPREHENSIVE METABOLIC PANEL
ALT: 32 U/L (ref 0–44)
AST: 44 U/L — ABNORMAL HIGH (ref 15–41)
Albumin: 3.1 g/dL — ABNORMAL LOW (ref 3.5–5.0)
Alkaline Phosphatase: 234 U/L — ABNORMAL HIGH (ref 38–126)
Anion gap: 10 (ref 5–15)
BUN: 12 mg/dL (ref 6–20)
CO2: 27 mmol/L (ref 22–32)
Calcium: 8.9 mg/dL (ref 8.9–10.3)
Chloride: 93 mmol/L — ABNORMAL LOW (ref 98–111)
Creatinine, Ser: 0.73 mg/dL (ref 0.61–1.24)
GFR, Estimated: >60 mL/min (ref >60)
Glucose, Bld: 307 mg/dL — ABNORMAL HIGH (ref 70–99)
Potassium: 4.4 mmol/L (ref 3.5–5.1)
Sodium: 130 mmol/L — ABNORMAL LOW (ref 135–145)
Total Bilirubin: 1.1 mg/dL (ref 0.3–1.2)
Total Protein: 7.7 g/dL (ref 6.5–8.1)

## 2020-06-03 LAB — CBC WITH DIFFERENTIAL/PLATELET
Abs Immature Granulocytes: 0.07 10*3/uL (ref 0.00–0.07)
Basophils Absolute: 0.1 10*3/uL (ref 0.0–0.1)
Basophils Relative: 0 %
Eosinophils Absolute: 0.1 10*3/uL (ref 0.0–0.5)
Eosinophils Relative: 1 %
HCT: 35.7 % — ABNORMAL LOW (ref 39.0–52.0)
Hemoglobin: 12 g/dL — ABNORMAL LOW (ref 13.0–17.0)
Immature Granulocytes: 1 %
Lymphocytes Relative: 10 %
Lymphs Abs: 1.3 10*3/uL (ref 0.7–4.0)
MCH: 26.8 pg (ref 26.0–34.0)
MCHC: 33.6 g/dL (ref 30.0–36.0)
MCV: 79.7 fL — ABNORMAL LOW (ref 80.0–100.0)
Monocytes Absolute: 0.9 10*3/uL (ref 0.1–1.0)
Monocytes Relative: 6 %
Neutro Abs: 11.1 10*3/uL — ABNORMAL HIGH (ref 1.7–7.7)
Neutrophils Relative %: 82 %
Platelets: 362 10*3/uL (ref 150–400)
RBC: 4.48 MIL/uL (ref 4.22–5.81)
RDW: 14.5 % (ref 11.5–15.5)
WBC: 13.4 10*3/uL — ABNORMAL HIGH (ref 4.0–10.5)
nRBC: 0 % (ref 0.0–0.2)

## 2020-06-03 MED ORDER — DEXTROSE 5 % IV SOLN
Freq: Once | INTRAVENOUS | Status: AC
  Filled 2020-06-03: qty 250

## 2020-06-03 MED ORDER — PALONOSETRON HCL INJECTION 0.25 MG/5ML
0.2500 mg | Freq: Once | INTRAVENOUS | Status: AC
  Administered 2020-06-03: 0.25 mg via INTRAVENOUS
  Filled 2020-06-03: qty 5

## 2020-06-03 MED ORDER — FLUOROURACIL CHEMO INJECTION 2.5 GM/50ML
400.0000 mg/m2 | Freq: Once | INTRAVENOUS | Status: AC
  Administered 2020-06-03: 900 mg via INTRAVENOUS
  Filled 2020-06-03: qty 18

## 2020-06-03 MED ORDER — SODIUM CHLORIDE 0.9 % IV SOLN
2400.0000 mg/m2 | INTRAVENOUS | Status: DC
  Administered 2020-06-03: 5550 mg via INTRAVENOUS
  Filled 2020-06-03: qty 111

## 2020-06-03 MED ORDER — SODIUM CHLORIDE 0.9 % IV SOLN
5.0000 mg/kg | Freq: Once | INTRAVENOUS | Status: AC
  Administered 2020-06-03: 500 mg via INTRAVENOUS
  Filled 2020-06-03: qty 16

## 2020-06-03 MED ORDER — HEPARIN SOD (PORK) LOCK FLUSH 100 UNIT/ML IV SOLN
500.0000 [IU] | Freq: Once | INTRAVENOUS | Status: DC | PRN
  Filled 2020-06-03: qty 5

## 2020-06-03 MED ORDER — SODIUM CHLORIDE 0.9 % IV SOLN
10.0000 mg | Freq: Once | INTRAVENOUS | Status: AC
  Administered 2020-06-03: 10 mg via INTRAVENOUS
  Filled 2020-06-03: qty 10

## 2020-06-03 MED ORDER — SODIUM CHLORIDE 0.9 % IV SOLN
Freq: Once | INTRAVENOUS | Status: AC
  Filled 2020-06-03: qty 250

## 2020-06-03 MED ORDER — OXALIPLATIN CHEMO INJECTION 100 MG/20ML
200.0000 mg | Freq: Once | INTRAVENOUS | Status: AC
  Administered 2020-06-03: 200 mg via INTRAVENOUS
  Filled 2020-06-03: qty 40

## 2020-06-03 MED ORDER — LEUCOVORIN CALCIUM INJECTION 350 MG: 400.0000 mg/m2 | Freq: Once | INTRAVENOUS | Status: DC

## 2020-06-03 MED ORDER — LEUCOVORIN CALCIUM INJECTION 100 MG
20.0000 mg/m2 | Freq: Once | INTRAMUSCULAR | Status: AC
  Administered 2020-06-03: 46 mg via INTRAVENOUS
  Filled 2020-06-03: qty 2.3

## 2020-06-03 NOTE — Progress Notes (Signed)
Hematology/Oncology progress note 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  male with PMH listed below was seen in consultation at the request of  Burnard Hawthorne, FNP  for evaluation of liver and pancreatic mass  #reports symptoms of upper abdomen band like discomfort and bloating for a few months, improved symptoms with omeprazole,.Chronic constipation, he has blood in the stool occationally Seen by PCP in September 2021. AlK phosphatase was recently noted to be elevated as well well elevated GGT. He was referred to see gastroenteralgias on 04/10/2020.  04/25/2020 EGD showed gastric fundus mild inflammation. Biopsy showed reactive gastropathy.  04/30/2020 [place] RUQ showed cirrhosis with multiple hepatic masses.  05/01/2020 AFP 1.7 05/02/2020 MR liver w wo contrast showed numerous heterozygous enhancing liver lesions, throughout the liver, not typical for [facility], likely metastatic disease.  Solid appearing enhancing lesion in pancreatic tail 1.9x1.5cm, suspicious for primary neoplasm.  Mild adenopathy within porta hepatic region and porta hepatic region. Splenomegaly.   Patient has history of SVT, psoriatic arthritis, morbid obesity, GERD, DM.  Family history + maternal uncle with esophageal cancer.   + unintentional weight loss, no fever, chills. "sweats a lot". Currently on antibiotics for proctitis.  He works at [employer], lives in [place]. He lives with his partner.  During today's visit, he felt  dizziness and lightheaded when sitting on the examination table.  He lost consciousness briefly for about 2-3 minutes, regain consciousness spontaneously, perfusing sweating. SBP in 90s, and HR initially in 30-40s, later improved to 70s.  He denies any chest pain, SOB. Blood sugar was 207.   INTERVAL HISTORY Dominic [name] is a 49 y.o. 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 Problems and complaints are listed below: 05/17/2020, colonoscopy showed a fungating and infiltrative partially obstructing large mass in the proximal rectum and in the mid rectum.  The mass was partially circumferential, involving two thirds of the lumen circumference is.  Biopsy showed adenocarcinoma.  05/23/2020, patient underwent EUS biopsy of the pancreatic tail lesion.  Biopsy pathology showed well-differentiated neuroendocrine tumor.  Ki-67 is noncontributory due to insufficient tissue in the cell block.  Patient has had a Mediport placed during interval, he has also been to chemotherapy education. Today he reports feeling well.  He follows up with primary care provider for diabetes control. Denies any difficulty in passing bowel movement, blood in the bowel movement.  Denies any additional syncope episodes.  Denies any chest pain, palpitation, shortness of breath.    Review of Systems  Constitutional: Positive for fatigue and unexpected weight change. Negative for appetite change, chills and fever.  HENT:   Negative for hearing loss and voice change.   Eyes: Negative for eye problems and icterus.  Respiratory: Negative for chest tightness, cough and shortness of breath.   Cardiovascular: Negative for chest pain and leg swelling.  Gastrointestinal: Negative for abdominal distention and abdominal pain.  Endocrine: Negative for hot flashes.  Genitourinary: Negative for difficulty urinating, dysuria  and frequency.  Musculoskeletal: Negative for arthralgias.  Skin: Negative for itching and rash.  Neurological: Negative for light-headedness and numbness.  Hematological: Negative for adenopathy. Does not bruise/bleed easily.  Psychiatric/Behavioral: Negative for confusion.    MEDICAL HISTORY:  Past Medical History:  Diagnosis Date  . Anxiety   . Cancer ([place])   . Diabetes mellitus without complication ([place])   . Dysrhythmia    svt  . GERD (gastroesophageal reflux disease)   . High triglycerides   . Hypertension   . Long-term insulin use in type 2 diabetes ([place])   . Morbid obesity ([place])   . Psoriatic arthritis ([place])   . SVT (supraventricular tachycardia) ([place])     SURGICAL HISTORY: Past Surgical History:  Procedure Laterality Date  . EUS N/A 05/23/2020   Procedure: FULL UPPER ENDOSCOPIC ULTRASOUND (EUS) RADIAL;  Surgeon: Jola Schmidt, MD;  Location: [facility] ENDOSCOPY;  Service: Endoscopy;  Laterality: N/A;  . PORTA CATH INSERTION N/A 05/30/2020   Procedure: PORTA CATH INSERTION;  Surgeon: Algernon Huxley, MD;  Location: [facility];  Service: Cardiovascular;  Laterality: N/A;  . UPPER GASTROINTESTINAL ENDOSCOPY      SOCIAL HISTORY: Social History   Socioeconomic History  . Marital status: Soil scientist    Spouse name: Marc Morgans  . Number of children: Not on file  . Years of education: Not on file  . Highest education level: Not on file  Occupational History  . Not on file  Tobacco Use  . Smoking status: Never Smoker  . Smokeless tobacco: Never Used  Vaping Use  . Vaping Use: Never used  Substance and Sexual Activity  . Alcohol use: Yes    Alcohol/week: 1.0 standard drink    Types: 1 Glasses of wine per week    Comment: very occasional  . Drug use: Never  . Sexual activity: Not on file  Other Topics Concern  . Not on file  Social History Narrative   Lives in [place].  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     Social Determinants of Health    Financial Resource Strain: Low Risk   . Difficulty of Paying Living Expenses: Not hard at all  Food Insecurity:   . Worried About Charity fundraiser in the Last Year: Not on file  . Ran Out of Food in the Last Year: Not on file  Transportation Needs:   . Lack of Transportation (Medical): Not on file  . Lack of Transportation (Non-Medical): Not on file  Physical Activity:   . Days of Exercise per Week: Not on file  . Minutes of Exercise per Session: Not on file  Stress:   . Feeling of Stress : Not on file  Social Connections:   . Frequency of Communication with Friends and Family: Not on file  . Frequency of Social Gatherings with Friends and Family: Not on file  . Attends Religious Services: Not on file  . Active Member of Clubs or Organizations: Not on file  . Attends Archivist Meetings: Not on file  . Marital Status: Not on file  Intimate Partner Violence:   . Fear of Current or Ex-Partner: Not on file  . Emotionally Abused: Not on file  . Physically Abused: Not on file  . Sexually Abused: Not on file    FAMILY HISTORY: Family History  Problem Relation Age of Onset  . Arthritis Mother   . Heart disease Mother   . Supraventricular tachycardia Mother        s/p ablation  .  Hypertension Mother   . Diabetes Father   . Heart attack Maternal Uncle   . Throat cancer Maternal Uncle   . Esophageal cancer Maternal Uncle   . Heart attack Maternal Uncle   . Heart attack Maternal Uncle   . Prostate cancer Neg Hx   . Thyroid cancer Neg Hx   . Colon cancer Neg Hx   . Rectal cancer Neg Hx   . Stomach cancer Neg Hx   . Colon polyps Neg Hx     ALLERGIES:  has No Known Allergies.  MEDICATIONS:  Current Outpatient Medications  Medication Sig Dispense Refill  . ALPRAZolam (XANAX) 0.5 MG tablet Take half tablet to one tablet by mouth as needed daily for insomnia, anxiety. 30 tablet 0  . amLODipine (NORVASC) 10 MG tablet Take 1 tablet (10 mg total) by mouth daily. 90  tablet 2  . Ascorbic Acid (VITAMIN C) 1000 MG tablet Take 1,000 mg by mouth daily.    . Continuous Blood Gluc Sensor (FREESTYLE LIBRE 2 SENSOR) MISC Scan glucose at least QID 2 each 11  . CONTOUR NEXT TEST test strip USE 1 STRIP TO TEST BLOOD  SUGAR TWICE DAILY 200 strip 3  . insulin lispro (HUMALOG KWIKPEN) 100 UNIT/ML KwikPen Inject 10 units into the skin 10 minutes prior to lunch & dinner. Hold if glucose is less than 150. (Patient taking differently: 25 Units. Inject 10 units into the skin 10 minutes prior to lunch & dinner. Hold if glucose is less than 150.) 15 mL 5  . Insulin Pen Needle (PEN NEEDLES) 32G X 6 MM MISC Used to give insulin injections 4 times daily. 100 each 10  . Lancets (ONETOUCH ULTRASOFT) lancets Used to check blood sugars twice daily. 100 each 12  . LANTUS SOLOSTAR 100 UNIT/ML Solostar Pen INJECT SUBCUTANEOUSLY 50  UNITS DAILY (Patient taking differently: Inject 60 Units into the skin. ) 45 mL 3  . lidocaine-prilocaine (EMLA) cream Apply to affected area once 30 g 3  . [name] Take 5 mg by mouth every evening.     . metFORMIN (GLUCOPHAGE XR) 500 MG 24 hr tablet Start 577m PO qpm for one week. Then the next week, take 1002mqhs. 90 tablet 3  . metoprolol succinate (TOPROL-XL) 100 MG 24 hr tablet TAKE 1 TABLET BY MOUTH  DAILY WITH OR IMMEDIATELY  FOLLOWING A MEAL 90 tablet 3  . ondansetron (ZOFRAN ODT) 4 MG disintegrating tablet Take 1 tablet (4 mg total) by mouth every 8 (eight) hours as needed for nausea or vomiting. 30 tablet 2  . ondansetron (ZOFRAN) 8 MG tablet Take 1 tablet (8 mg total) by mouth 2 (two) times daily as needed for refractory nausea / vomiting. Start on day 3 after chemotherapy. 30 tablet 1  . prochlorperazine (COMPAZINE) 10 MG tablet Take 1 tablet (10 mg total) by mouth every 6 (six) hours as needed (Nausea or vomiting). 30 tablet 1  . telmisartan (MICARDIS) 40 MG tablet Take 1 tablet (40 mg total) by mouth daily. PLEASE CALL OFFICE TO SCHEDULE FOLLOW  UP APPOINTMENT FOR ANYMORE REFILLS. 90 tablet 0  . famotidine (PEPCID) 20 MG tablet TAKE 1 TABLET BY MOUTH  TWICE DAILY (Patient not taking: Reported on 05/31/2020) 120 tablet 5  . hydrochlorothiazide (HYDRODIURIL) 25 MG tablet Take 25 mg by mouth daily.  (Patient not taking: Reported on 05/30/2020)    . metoprolol succinate (TOPROL-XL) 25 MG 24 hr tablet Take 1 tablet (25 mg) by mouth once daily at bedtime (Patient  not taking: Reported on 05/31/2020) 90 tablet 3  . tamsulosin (FLOMAX) 0.4 MG CAPS capsule Take 1 capsule (0.4 mg total) by mouth daily. (Patient not taking: Reported on 05/31/2020) 30 capsule 1  . Turmeric 500 MG CAPS Take by mouth. (Patient not taking: Reported on 05/31/2020)     Current Facility-Administered Medications  Medication Dose Route Frequency Provider Last Rate Last Admin  . 0.9 %  sodium chloride infusion  500 mL Intravenous Once Nelida Meuse III, MD         PHYSICAL EXAMINATION: ECOG PERFORMANCE STATUS: 1 - Symptomatic but completely ambulatory Vitals:   06/03/20 0840  BP: (!) 141/86  Pulse: 98  Resp: 16  Temp: 99.2 F (37.3 C)  SpO2: 98%   Filed Weights   06/03/20 0840  Weight: 243 lb (110.2 kg)    Physical Exam Constitutional:      General: He is not in acute distress. HENT:     Head: Normocephalic and atraumatic.  Eyes:     General: No scleral icterus. Cardiovascular:     Rate and Rhythm: Normal rate and regular rhythm.     Heart sounds: Normal heart sounds.  Pulmonary:     Effort: Pulmonary effort is normal. No respiratory distress.     Breath sounds: No wheezing.  Abdominal:     General: Bowel sounds are normal. There is no distension.     Palpations: Abdomen is soft.  Musculoskeletal:        General: No deformity. Normal range of motion.     Cervical back: Normal range of motion and neck supple.  Skin:    General: Skin is warm and dry.     Findings: No erythema or rash.  Neurological:     Mental Status: He is alert and oriented to  person, place, and time. Mental status is at baseline.     Cranial Nerves: No cranial nerve deficit.     Coordination: Coordination normal.  Psychiatric:        Mood and Affect: Mood normal.     LABORATORY DATA:  I have reviewed the data as listed Lab Results  Component Value Date   WBC 9.5 05/08/2020   HGB 12.7 (L) 05/08/2020   HCT 39.0 05/08/2020   MCV 80.6 05/08/2020   PLT 443 (H) 05/08/2020   Recent Labs    08/11/19 1007 10/17/19 0928 10/17/19 0928 10/18/19 1444 03/07/20 0902 03/13/20 1255 04/10/20 1021 04/25/20 0942 05/07/20 0933  NA   < > 137  --  134* 135  --   --   --  136  K   < > 5.7*  --  3.7 4.5  --   --   --  4.4  CL   < > 96  --  99 94*  --   --   --  98  CO2   < > 23  --  25 25  --   --   --  21  GLUCOSE   < > 246*  --  377* 109*  --   --   --  175*  BUN   < > 14   < > 16 13  --  18  --  12  CREATININE   < > 0.93   < > 0.93 0.79  --  0.91  --  0.86  CALCIUM   < > 9.7  --  8.9 9.4  --   --   --  9.2  GFRNONAA   < >  96   < > >60 105  --  98  --  101  GFRAA   < > 111   < > >60 121  --  113  --  117  PROT  --  7.4  --   --  7.6  --   --  6.5  --   ALBUMIN  --  4.4  --   --  4.2  --   --  3.1*  --   AST  --  22  --   --  27  --   --  18  --   ALT  --  24  --   --  25  --   --  17  --   ALKPHOS  --  85   < >  --  179* 172*  --  215*  --   BILITOT  --  0.6  --   --  0.4  --   --  0.7  --   BILIDIR  --   --   --   --   --   --   --  0.47*  --    < > = values in this interval not displayed.   Iron/TIBC/Ferritin/ %Sat No results found for: IRON, TIBC, FERRITIN, IRONPCTSAT    RADIOGRAPHIC STUDIES: I have personally reviewed the radiological images as listed and agreed with the findings in the report. CT Chest W Contrast  Result Date: 05/15/2020 CLINICAL DATA:  Colorectal carcinoma with liver metastasis. EXAM: CT CHEST WITH CONTRAST TECHNIQUE: Multidetector CT imaging of the chest was performed during intravenous contrast administration. CONTRAST:  77m  OMNIPAQUE IOHEXOL 300 MG/ML  SOLN COMPARISON:  Abdominal MRI 05/01/2020 FINDINGS: Cardiovascular: No significant vascular findings. Normal heart size. No pericardial effusion. Mediastinum/Nodes: No axillary or supraclavicular adenopathy. Small 6 mm node in the anterior mediastinum (image 42/2). No paratracheal adenopathy. Hilar adenopathy. No supraclavicular adenopathy. No pericardial fluid. Esophagus normal. Lungs/Pleura: 7 mm nodule in the apex of the RIGHT lung (image 27/3). Airways normal. No pleural disease. Upper Abdomen: Multiple low-attenuation round lesions in the liver consistent metastatic disease. Cysts within the spleen. Adrenal glands normal. Musculoskeletal: No aggressive osseous lesion IMPRESSION: 1. A single RIGHT upper lobe pulmonary nodule. In light of the metastatic disease within the liver this nodule is concerning for solitary pulmonary metastasis. Lesion is at the lower limits of size for accurate PET characterization and would be difficult to biopsy. Recommend close interval follow-up. 2. A small anterior mediastinal node is indeterminate. 3. No skeletal metastasis. Electronically Signed   By: SSuzy BouchardM.D.   On: 05/15/2020 09:05   PERIPHERAL VASCULAR CATHETERIZATION  Result Date: 05/30/2020 See op note  [place]OPSY (LIVER)  Result Date: 05/08/2020 INDICATION: 49year old with multiple liver lesions and a suspicious pancreatic lesion. Findings are concerning for metastatic disease and patient needs a tissue diagnosis. EXAM: ULTRASOUND-GUIDED LIVER LESION BIOPSY MEDICATIONS: None. ANESTHESIA/SEDATION: Moderate (conscious) sedation was employed during this procedure. A total of Versed 2.0 mg and Fentanyl 100 mcg was administered intravenously. Moderate Sedation Time: 17 minutes. The patient's level of consciousness and vital signs were monitored continuously by radiology nursing throughout the procedure under my direct supervision. FLUOROSCOPY TIME:  None COMPLICATIONS: None  immediate. PROCEDURE: Informed written consent was obtained from the patient after a thorough discussion of the procedural risks, benefits and alternatives. All questions were addressed. A timeout was performed prior to the initiation of the procedure. Liver was evaluated with ultrasound. Multiple liver lesions  were identified. Lesion in the left hepatic lobe was targeted for biopsy. The anterior abdomen was prepped with chlorhexidine and sterile field was created. Skin and soft tissues were anesthetized using 1% lidocaine. A small incision was made. Using ultrasound guidance, 17 gauge coaxial needle was directed into the left hepatic lobe and a hepatic lesion. Needle position was confirmed within the lesion. Total of 4 core biopsies were obtained using an 18 gauge core device. Specimens placed in formalin. Needle was removed without complication. Bandage placed over the puncture site. FINDINGS: Numerous heterogeneous liver lesions. Majority of the lesions are slightly hyperechoic. Needle placement confirmed within a left hepatic lesion. Adequate core specimens obtained. No immediate bleeding or hematoma formation. IMPRESSION: Ultrasound-guided core biopsy of a hepatic lesion. Electronically Signed   By: Markus Daft M.D.   On: 05/08/2020 10:20      ASSESSMENT & PLAN:  1. Rectal cancer metastasized to liver ([place])   2. Goals of care, counseling/discussion   3. Encounter for antineoplastic chemotherapy   4. Neuroendocrine carcinoma of pancreas ([place])   5. Uncontrolled other specified diabetes mellitus with hyperglycemia ([place])   Cancer Staging Rectal cancer metastasized to liver [facility]) Staging form: Colon and Rectum, AJCC 8th Edition - Clinical stage from 06/03/2020: Stage Unknown (cTX, cNX, pM1) - Signed by Earlie Server, MD on 06/03/2020   #Stage IV rectal cancer Colonoscopy findings, imaging results, biopsy results were discussed with patient. Findings are consistent with Stage IV rectal cancer with liver  metastasis. The diagnosis and care plan were discussed with patient in detail.  NCCN guidelines were reviewed and shared with patient.  The goal of treatment which is to palliate disease, disease related symptoms, improve quality of life and hopefully prolong life was highlighted in our discussion.  Chemotherapy education was provided.  We had discussed the composition of chemotherapy regimen, length of chemo cycle, duration of treatment and the time to assess response to treatment.    I explained to the patient the risks and benefits of chemotherapy FOLFOX plus bevacizumab including all but not limited to hair loss, mouth sore, nausea, vomiting, diarrhea, low blood counts, bleeding, neuropathy and risk of life threatening infection and even death, secondary malignancy, increased blood pressure, thrombosis events, MI, perforation, stroke etc.  . Patient voices understanding and willing to proceed chemotherapy.   # Antiemetics-Zofran and Compazine instructions were reviewed in details with patient. Supportive care measures are necessary for patient well-being and will be provided as necessary. We spent sufficient time to discuss many aspect of care, questions were answered to patient's satisfaction.   # Pancreatic well differentiated neuroendocrine carcinoma, lesion is 1.9 x 1.5cm CA 19-9 is significantly elevated.  I will check chromogranin A and urine 5 HIAA level. He will need Dotadate PET in the future. Priority now is his metastatic rectal cancer.  Patient will need genetic testing. Will further discuss with patient.   #Uncontrolled diabetes, glucose level is 307. Recommend patient to follow-up closely with primary care provider for diabetes control. Strict diabetic diet. All questions were answered. The patient knows to call the clinic with any problems questions or concerns.  cc Burnard Hawthorne, FNP    Return of visit: 1 week Earlie Server, MD, PhD Hematology Oncology [facility] Pager- [phone] 06/03/2020

## 2020-06-03 NOTE — Progress Notes (Signed)
Pt recevied first time bev/folfox tx regimen today. Tolerated well. Pump initiated and pt to return in 2 days for d/c.

## 2020-06-04 ENCOUNTER — Telehealth: Payer: Self-pay

## 2020-06-04 LAB — CHROMOGRANIN A: Chromogranin A (ng/mL): 46 ng/mL (ref 0.0–101.8)

## 2020-06-04 LAB — CEA: CEA: 1319 ng/mL — ABNORMAL HIGH (ref 0.0–4.7)

## 2020-06-04 NOTE — Telephone Encounter (Signed)
Telephone call to patient for follow up after receiving first infusion.   Patient states infusion went great.  States eating good and drinking plenty of fluids.   Denies any nausea or vomiting.  Encouraged patient to call for any concerns or questions. 

## 2020-06-05 ENCOUNTER — Inpatient Hospital Stay: Payer: Managed Care, Other (non HMO)

## 2020-06-05 ENCOUNTER — Other Ambulatory Visit: Payer: Self-pay

## 2020-06-05 VITALS — BP 146/68 | HR 96 | Temp 98.1°F | Resp 17

## 2020-06-05 DIAGNOSIS — Z5112 Encounter for antineoplastic immunotherapy: Secondary | ICD-10-CM | POA: Diagnosis not present

## 2020-06-05 DIAGNOSIS — C20 Malignant neoplasm of rectum: Secondary | ICD-10-CM

## 2020-06-05 DIAGNOSIS — C787 Secondary malignant neoplasm of liver and intrahepatic bile duct: Secondary | ICD-10-CM

## 2020-06-05 MED ORDER — HEPARIN SOD (PORK) LOCK FLUSH 100 UNIT/ML IV SOLN
500.0000 [IU] | Freq: Once | INTRAVENOUS | Status: AC | PRN
  Administered 2020-06-05: 500 [IU]
  Filled 2020-06-05: qty 5

## 2020-06-05 MED ORDER — SODIUM CHLORIDE 0.9 % IV SOLN
Freq: Once | INTRAVENOUS | Status: AC
  Filled 2020-06-05: qty 250

## 2020-06-05 MED ORDER — HEPARIN SOD (PORK) LOCK FLUSH 100 UNIT/ML IV SOLN
INTRAVENOUS | Status: AC
  Filled 2020-06-05: qty 5

## 2020-06-05 MED ORDER — SODIUM CHLORIDE 0.9% FLUSH
10.0000 mL | INTRAVENOUS | Status: DC | PRN
  Filled 2020-06-05: qty 10

## 2020-06-05 NOTE — Progress Notes (Signed)
Pt tolerated infusion well. Pt stable at discharge. 

## 2020-06-10 ENCOUNTER — Inpatient Hospital Stay: Payer: Managed Care, Other (non HMO)

## 2020-06-10 ENCOUNTER — Inpatient Hospital Stay (HOSPITAL_BASED_OUTPATIENT_CLINIC_OR_DEPARTMENT_OTHER): Payer: Managed Care, Other (non HMO) | Admitting: Oncology

## 2020-06-10 ENCOUNTER — Other Ambulatory Visit: Payer: Self-pay

## 2020-06-10 ENCOUNTER — Encounter: Payer: Self-pay | Admitting: Oncology

## 2020-06-10 VITALS — BP 150/82 | HR 98 | Temp 98.6°F | Wt 237.0 lb

## 2020-06-10 DIAGNOSIS — E1365 Other specified diabetes mellitus with hyperglycemia: Secondary | ICD-10-CM

## 2020-06-10 DIAGNOSIS — C787 Secondary malignant neoplasm of liver and intrahepatic bile duct: Secondary | ICD-10-CM

## 2020-06-10 DIAGNOSIS — C7A8 Other malignant neuroendocrine tumors: Secondary | ICD-10-CM

## 2020-06-10 DIAGNOSIS — G620 Drug-induced polyneuropathy: Secondary | ICD-10-CM | POA: Diagnosis not present

## 2020-06-10 DIAGNOSIS — C20 Malignant neoplasm of rectum: Secondary | ICD-10-CM | POA: Diagnosis not present

## 2020-06-10 DIAGNOSIS — T451X5A Adverse effect of antineoplastic and immunosuppressive drugs, initial encounter: Secondary | ICD-10-CM

## 2020-06-10 DIAGNOSIS — Z5112 Encounter for antineoplastic immunotherapy: Secondary | ICD-10-CM | POA: Diagnosis not present

## 2020-06-10 DIAGNOSIS — C7951 Secondary malignant neoplasm of bone: Secondary | ICD-10-CM

## 2020-06-10 LAB — CBC WITH DIFFERENTIAL/PLATELET
Abs Immature Granulocytes: 0.07 10*3/uL (ref 0.00–0.07)
Basophils Absolute: 0 10*3/uL (ref 0.0–0.1)
Basophils Relative: 0 %
Eosinophils Absolute: 0.1 10*3/uL (ref 0.0–0.5)
Eosinophils Relative: 1 %
HCT: 35.5 % — ABNORMAL LOW (ref 39.0–52.0)
Hemoglobin: 11.5 g/dL — ABNORMAL LOW (ref 13.0–17.0)
Immature Granulocytes: 1 %
Lymphocytes Relative: 16 %
Lymphs Abs: 1.8 10*3/uL (ref 0.7–4.0)
MCH: 26.3 pg (ref 26.0–34.0)
MCHC: 32.4 g/dL (ref 30.0–36.0)
MCV: 81.2 fL (ref 80.0–100.0)
Monocytes Absolute: 0.7 10*3/uL (ref 0.1–1.0)
Monocytes Relative: 6 %
Neutro Abs: 8.7 10*3/uL — ABNORMAL HIGH (ref 1.7–7.7)
Neutrophils Relative %: 76 %
Platelets: 486 10*3/uL — ABNORMAL HIGH (ref 150–400)
RBC: 4.37 MIL/uL (ref 4.22–5.81)
RDW: 14.4 % (ref 11.5–15.5)
WBC: 11.4 10*3/uL — ABNORMAL HIGH (ref 4.0–10.5)
nRBC: 0 % (ref 0.0–0.2)

## 2020-06-10 LAB — COMPREHENSIVE METABOLIC PANEL
ALT: 25 U/L (ref 0–44)
AST: 28 U/L (ref 15–41)
Albumin: 3 g/dL — ABNORMAL LOW (ref 3.5–5.0)
Alkaline Phosphatase: 174 U/L — ABNORMAL HIGH (ref 38–126)
Anion gap: 13 (ref 5–15)
BUN: 17 mg/dL (ref 6–20)
CO2: 23 mmol/L (ref 22–32)
Calcium: 8.8 mg/dL — ABNORMAL LOW (ref 8.9–10.3)
Chloride: 98 mmol/L (ref 98–111)
Creatinine, Ser: 0.64 mg/dL (ref 0.61–1.24)
GFR, Estimated: >60 mL/min (ref >60)
Glucose, Bld: 198 mg/dL — ABNORMAL HIGH (ref 70–99)
Potassium: 4 mmol/L (ref 3.5–5.1)
Sodium: 134 mmol/L — ABNORMAL LOW (ref 135–145)
Total Bilirubin: 0.7 mg/dL (ref 0.3–1.2)
Total Protein: 7.8 g/dL (ref 6.5–8.1)

## 2020-06-10 MED ORDER — SODIUM CHLORIDE 0.9% FLUSH
10.0000 mL | Freq: Once | INTRAVENOUS | Status: AC
  Administered 2020-06-10: 10 mL via INTRAVENOUS
  Filled 2020-06-10: qty 10

## 2020-06-10 MED ORDER — HEPARIN SOD (PORK) LOCK FLUSH 100 UNIT/ML IV SOLN
500.0000 [IU] | Freq: Once | INTRAVENOUS | Status: AC
  Administered 2020-06-10: 500 [IU] via INTRAVENOUS
  Filled 2020-06-10: qty 5

## 2020-06-10 MED ORDER — HEPARIN SOD (PORK) LOCK FLUSH 100 UNIT/ML IV SOLN
INTRAVENOUS | Status: AC
  Filled 2020-06-10: qty 5

## 2020-06-10 NOTE — Progress Notes (Signed)
Patient reports having an episode of neuropathy in feet and 3 fingers on right hand.  6 lb wt loss since last visit despite having a good appetite.  Has issues with eye floaters, light sensitivity, and slight pain that is not new but has not reported to Dr. Tasia Catchings.

## 2020-06-11 DIAGNOSIS — E1165 Type 2 diabetes mellitus with hyperglycemia: Secondary | ICD-10-CM | POA: Insufficient documentation

## 2020-06-11 DIAGNOSIS — IMO0002 Reserved for concepts with insufficient information to code with codable children: Secondary | ICD-10-CM | POA: Insufficient documentation

## 2020-06-11 DIAGNOSIS — T451X5A Adverse effect of antineoplastic and immunosuppressive drugs, initial encounter: Secondary | ICD-10-CM | POA: Insufficient documentation

## 2020-06-11 DIAGNOSIS — G620 Drug-induced polyneuropathy: Secondary | ICD-10-CM | POA: Insufficient documentation

## 2020-06-11 NOTE — Progress Notes (Signed)
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  male with PMH listed below was seen in consultation at the request of  Burnard Hawthorne, FNP  for evaluation of liver and pancreatic mass  #reports symptoms of upper abdomen band like discomfort and bloating for a few months, improved symptoms with omeprazole,.Chronic constipation, he has blood in the stool occationally Seen by PCP in September 2021. AlK phosphatase was recently noted to be elevated as well well elevated GGT. He was referred to see gastroenteralgias on 04/10/2020.  04/25/2020 EGD showed gastric fundus mild inflammation. Biopsy showed reactive gastropathy.  04/30/2020 [place] RUQ showed cirrhosis with multiple hepatic masses.  05/01/2020 AFP 1.7 05/02/2020 MR liver w wo contrast showed numerous heterozygous enhancing liver lesions, throughout the liver, not typical for [facility], likely metastatic disease.  Solid appearing enhancing lesion in pancreatic tail 1.9x1.5cm, suspicious for primary neoplasm.  Mild adenopathy within porta hepatic region and porta hepatic region. Splenomegaly.   Patient has history of SVT, psoriatic arthritis, morbid obesity, GERD, DM.  Family history + maternal uncle with esophageal cancer.   + unintentional weight loss, no fever, chills. "sweats a lot". Currently on antibiotics for proctitis.  He works at [employer], lives in [place]. He lives with his partner.  During today's visit, he felt  dizziness and lightheaded when sitting on the examination table.  He lost consciousness briefly for about 2-3 minutes, regain consciousness spontaneously, perfusing sweating. SBP in 90s, and HR initially in 30-40s, later improved to 70s.  He denies any chest pain, SOB. Blood sugar was 207.   # 05/17/2020, colonoscopy showed a fungating and infiltrative partially obstructing large mass in the proximal rectum and in the mid rectum.  The mass was partially circumferential, involving two thirds of the lumen circumference is.  Biopsy showed adenocarcinoma.  05/23/2020, patient underwent EUS biopsy of the pancreatic tail lesion.  Biopsy pathology showed well-differentiated neuroendocrine tumor.  Ki-67 is noncontributory due to insufficient tissue in the cell block.  INTERVAL HISTORY Dominic [name] is a 49 y.o. 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 Problems and complaints are listed below: Status post cycle 1 FOLFOX with bevacizumab.  Patient reports episode of feeling numbness and tingling in feet and 3 of his fingers on right hand.  Symptoms resolved spontaneously.  His appetite is good.  Has lost another 6 pounds since last visit. He also noticed eye floaters, light sensitivity. Denies any chest pain, palpitation, shortness of breath.    Review of Systems  Constitutional: Positive for fatigue and unexpected weight change. Negative for appetite change, chills and fever.  HENT:   Negative for hearing loss and voice change.   Eyes: Negative for eye problems and icterus.  Respiratory: Negative for chest tightness, cough and shortness of breath.   Cardiovascular: Negative for chest pain and leg swelling.  Gastrointestinal: Negative for abdominal distention and abdominal pain.  Endocrine: Negative for hot flashes.  Genitourinary: Negative for difficulty urinating, dysuria and  frequency.   Musculoskeletal: Negative for arthralgias.  Skin: Negative for itching and rash.  Neurological: Positive for numbness. Negative for light-headedness.  Hematological: Negative for adenopathy. Does not bruise/bleed easily.  Psychiatric/Behavioral: Negative for confusion.    MEDICAL HISTORY:  Past Medical History:  Diagnosis Date  . Anxiety   . Cancer ([place])   . Diabetes mellitus without complication ([place])   . Dysrhythmia    svt  . GERD (gastroesophageal reflux disease)   . High triglycerides   . Hypertension   . Long-term insulin use in type 2 diabetes ([place])   . Morbid obesity ([place])   . Psoriatic arthritis ([place])   . SVT (supraventricular tachycardia) ([place])     SURGICAL HISTORY: Past Surgical History:  Procedure Laterality Date  . EUS N/A 05/23/2020   Procedure: FULL UPPER ENDOSCOPIC ULTRASOUND (EUS) RADIAL;  Surgeon: Jola Schmidt, MD;  Location: [facility] ENDOSCOPY;  Service: Endoscopy;  Laterality: N/A;  . PORTA CATH INSERTION N/A 05/30/2020   Procedure: PORTA CATH INSERTION;  Surgeon: Algernon Huxley, MD;  Location: [place] [facility];  Service: Cardiovascular;  Laterality: N/A;  . UPPER GASTROINTESTINAL ENDOSCOPY      SOCIAL HISTORY: Social History   Socioeconomic History  . Marital status: Soil scientist    Spouse name: Marc Morgans  . Number of children: Not on file  . Years of education: Not on file  . Highest education level: Not on file  Occupational History  . Not on file  Tobacco Use  . Smoking status: Never Smoker  . Smokeless tobacco: Never Used  Vaping Use  . Vaping Use: Never used  Substance and Sexual Activity  . Alcohol use: Yes    Alcohol/week: 1.0 standard drink    Types: 1 Glasses of wine per week    Comment: very occasional  . Drug use: Never  . Sexual activity: Not on file  Other Topics Concern  . Not on file  Social History Narrative   Lives in [place].  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     Social Determinants of Health    Financial Resource Strain: Low Risk   . Difficulty of Paying Living Expenses: Not hard at all  Food Insecurity:   . Worried About Charity fundraiser in the Last Year: Not on file  . Ran Out of Food in the Last Year: Not on file  Transportation Needs:   . Lack of Transportation (Medical): Not on file  . Lack of Transportation (Non-Medical): Not on file  Physical Activity:   . Days of Exercise per Week: Not on file  . Minutes of Exercise per Session: Not on file  Stress:   . Feeling of Stress : Not on file  Social Connections:   . Frequency of Communication with Friends and Family: Not on file  . Frequency of Social Gatherings with Friends and Family: Not on file  . Attends Religious Services: Not on file  . Active Member of Clubs or Organizations: Not on file  . Attends Archivist Meetings: Not on file  . Marital Status: Not on file  Intimate Partner Violence:   . Fear of Current or Ex-Partner: Not on file  . Emotionally Abused: Not on file  . Physically Abused: Not on file  . Sexually Abused: Not on file    FAMILY HISTORY: Family History  Problem Relation Age of Onset  . Arthritis Mother   . Heart disease Mother   . Supraventricular tachycardia Mother  s/p ablation  . Hypertension Mother   . Diabetes Father   . Heart attack Maternal Uncle   . Throat cancer Maternal Uncle   . Esophageal cancer Maternal Uncle   . Heart attack Maternal Uncle   . Heart attack Maternal Uncle   . Prostate cancer Neg Hx   . Thyroid cancer Neg Hx   . Colon cancer Neg Hx   . Rectal cancer Neg Hx   . Stomach cancer Neg Hx   . Colon polyps Neg Hx     ALLERGIES:  has No Known Allergies.  MEDICATIONS:  Current Outpatient Medications  Medication Sig Dispense Refill  . ALPRAZolam (XANAX) 0.5 MG tablet Take half tablet to one tablet by mouth as needed daily for insomnia, anxiety. 30 tablet 0  . amLODipine (NORVASC) 10 MG tablet Take 1 tablet (10 mg total) by mouth daily. 90  tablet 2  . Ascorbic Acid (VITAMIN C) 1000 MG tablet Take 1,000 mg by mouth daily.    . Continuous Blood Gluc Sensor (FREESTYLE LIBRE 2 SENSOR) MISC Scan glucose at least QID 2 each 11  . CONTOUR NEXT TEST test strip USE 1 STRIP TO TEST BLOOD  SUGAR TWICE DAILY 200 strip 3  . famotidine (PEPCID) 20 MG tablet TAKE 1 TABLET BY MOUTH  TWICE DAILY 120 tablet 5  . insulin lispro (HUMALOG KWIKPEN) 100 UNIT/ML KwikPen Inject 10 units into the skin 10 minutes prior to lunch & dinner. Hold if glucose is less than 150. (Patient taking differently: 25 Units. Inject 10 units into the skin 10 minutes prior to lunch & dinner. Hold if glucose is less than 150.) 15 mL 5  . Insulin Pen Needle (PEN NEEDLES) 32G X 6 MM MISC Used to give insulin injections 4 times daily. 100 each 10  . Lancets (ONETOUCH ULTRASOFT) lancets Used to check blood sugars twice daily. 100 each 12  . LANTUS SOLOSTAR 100 UNIT/ML Solostar Pen INJECT SUBCUTANEOUSLY 50  UNITS DAILY (Patient taking differently: Inject 60 Units into the skin. ) 45 mL 3  . lidocaine-prilocaine (EMLA) cream Apply to affected area once 30 g 3  . [name] Take 5 mg by mouth at bedtime as needed.     . metFORMIN (GLUCOPHAGE XR) 500 MG 24 hr tablet Start $RemoveBefo'500mg'PnjtwpMkNIq$  PO qpm for one week. Then the next week, take $RemoveBe'1000mg'TbuEtxvqG$  qhs. 90 tablet 3  . metoprolol succinate (TOPROL-XL) 100 MG 24 hr tablet TAKE 1 TABLET BY MOUTH  DAILY WITH OR IMMEDIATELY  FOLLOWING A MEAL 90 tablet 3  . ondansetron (ZOFRAN) 8 MG tablet Take 1 tablet (8 mg total) by mouth 2 (two) times daily as needed for refractory nausea / vomiting. Start on day 3 after chemotherapy. 30 tablet 1  . prochlorperazine (COMPAZINE) 10 MG tablet Take 1 tablet (10 mg total) by mouth every 6 (six) hours as needed (Nausea or vomiting). 30 tablet 1  . telmisartan (MICARDIS) 40 MG tablet Take 1 tablet (40 mg total) by mouth daily. PLEASE CALL OFFICE TO SCHEDULE FOLLOW UP APPOINTMENT FOR ANYMORE REFILLS. 90 tablet 0  .  hydrochlorothiazide (HYDRODIURIL) 25 MG tablet Take 25 mg by mouth daily.  (Patient not taking: Reported on 05/30/2020)    . metoprolol succinate (TOPROL-XL) 25 MG 24 hr tablet Take 1 tablet (25 mg) by mouth once daily at bedtime (Patient not taking: Reported on 05/31/2020) 90 tablet 3  . ondansetron (ZOFRAN ODT) 4 MG disintegrating tablet Take 1 tablet (4 mg total) by mouth every 8 (eight) hours as  needed for nausea or vomiting. (Patient not taking: Reported on 06/10/2020) 30 tablet 2  . tamsulosin (FLOMAX) 0.4 MG CAPS capsule Take 1 capsule (0.4 mg total) by mouth daily. (Patient not taking: Reported on 05/31/2020) 30 capsule 1  . Turmeric 500 MG CAPS Take by mouth. (Patient not taking: Reported on 05/31/2020)     Current Facility-Administered Medications  Medication Dose Route Frequency Provider Last Rate Last Admin  . 0.9 %  sodium chloride infusion  500 mL Intravenous Once Nelida Meuse III, MD         PHYSICAL EXAMINATION: ECOG PERFORMANCE STATUS: 1 - Symptomatic but completely ambulatory Vitals:   06/10/20 1427  BP: (!) 150/82  Pulse: 98  Temp: 98.6 F (37 C)  SpO2: 99%   Filed Weights   06/10/20 1427  Weight: 237 lb (107.5 kg)    Physical Exam Constitutional:      General: He is not in acute distress. HENT:     Head: Normocephalic and atraumatic.  Eyes:     General: No scleral icterus. Cardiovascular:     Rate and Rhythm: Normal rate and regular rhythm.     Heart sounds: Normal heart sounds.  Pulmonary:     Effort: Pulmonary effort is normal. No respiratory distress.     Breath sounds: No wheezing.  Abdominal:     General: Bowel sounds are normal. There is no distension.     Palpations: Abdomen is soft.  Musculoskeletal:        General: No deformity. Normal range of motion.     Cervical back: Normal range of motion and neck supple.  Skin:    General: Skin is warm and dry.     Findings: No erythema or rash.  Neurological:     Mental Status: He is alert and  oriented to person, place, and time. Mental status is at baseline.     Cranial Nerves: No cranial nerve deficit.     Coordination: Coordination normal.  Psychiatric:        Mood and Affect: Mood normal.     LABORATORY DATA:  I have reviewed the data as listed Lab Results  Component Value Date   WBC 11.4 (H) 06/10/2020   HGB 11.5 (L) 06/10/2020   HCT 35.5 (L) 06/10/2020   MCV 81.2 06/10/2020   PLT 486 (H) 06/10/2020   Recent Labs    03/07/20 0902 03/07/20 0902 03/13/20 1255 04/10/20 1021 04/25/20 0942 05/07/20 0933 06/03/20 0815 06/10/20 1403  NA 135   < >  --   --   --  136 130* 134*  K 4.5   < >  --   --   --  4.4 4.4 4.0  CL 94*   < >  --   --   --  98 93* 98  CO2 25   < >  --   --   --  21 27 23   GLUCOSE 109*   < >  --   --   --  175* 307* 198*  BUN 13   < >  --  18  --  12 12 17   CREATININE 0.79   < >  --  0.91  --  0.86 0.73 0.64  CALCIUM 9.4   < >  --   --   --  9.2 8.9 8.8*  GFRNONAA 105   < >  --  98  --  101 >60 >60  GFRAA 121  --   --  113  --  117  --   --   PROT 7.6   < >  --   --  6.5  --  7.7 7.8  ALBUMIN 4.2   < >  --   --  3.1*  --  3.1* 3.0*  AST 27   < >  --   --  18  --  44* 28  ALT 25   < >  --   --  17  --  32 25  ALKPHOS 179*   < >   < >  --  215*  --  234* 174*  BILITOT 0.4   < >  --   --  0.7  --  1.1 0.7  BILIDIR  --   --   --   --  0.47*  --   --   --    < > = values in this interval not displayed.   Iron/TIBC/Ferritin/ %Sat No results found for: IRON, TIBC, FERRITIN, IRONPCTSAT    RADIOGRAPHIC STUDIES: I have personally reviewed the radiological images as listed and agreed with the findings in the report. CT Chest W Contrast  Result Date: 05/15/2020 CLINICAL DATA:  Colorectal carcinoma with liver metastasis. EXAM: CT CHEST WITH CONTRAST TECHNIQUE: Multidetector CT imaging of the chest was performed during intravenous contrast administration. CONTRAST:  36m OMNIPAQUE IOHEXOL 300 MG/ML  SOLN COMPARISON:  Abdominal MRI 05/01/2020  FINDINGS: Cardiovascular: No significant vascular findings. Normal heart size. No pericardial effusion. Mediastinum/Nodes: No axillary or supraclavicular adenopathy. Small 6 mm node in the anterior mediastinum (image 42/2). No paratracheal adenopathy. Hilar adenopathy. No supraclavicular adenopathy. No pericardial fluid. Esophagus normal. Lungs/Pleura: 7 mm nodule in the apex of the RIGHT lung (image 27/3). Airways normal. No pleural disease. Upper Abdomen: Multiple low-attenuation round lesions in the liver consistent metastatic disease. Cysts within the spleen. Adrenal glands normal. Musculoskeletal: No aggressive osseous lesion IMPRESSION: 1. A single RIGHT upper lobe pulmonary nodule. In light of the metastatic disease within the liver this nodule is concerning for solitary pulmonary metastasis. Lesion is at the lower limits of size for accurate PET characterization and would be difficult to biopsy. Recommend close interval follow-up. 2. A small anterior mediastinal node is indeterminate. 3. No skeletal metastasis. Electronically Signed   By: SSuzy BouchardM.D.   On: 05/15/2020 09:05   PERIPHERAL VASCULAR CATHETERIZATION  Result Date: 05/30/2020 See op note     ASSESSMENT & PLAN:  1. Rectal cancer metastasized to liver ([facility]   2. Neuroendocrine carcinoma of pancreas ([facility]   3. Uncontrolled other specified diabetes mellitus with hyperglycemia ([facility]   4. Neuropathy due to chemotherapeutic drug ([facility]   Cancer Staging Rectal cancer metastasized to liver ([place] [name] Staging form: Colon and Rectum, AJCC 8th Edition - Clinical stage from 06/03/2020: Stage Unknown (cTX, cNX, pM1) - Signed by YEarlie Server MD on 06/03/2020   #Stage IV rectal cancer S/p 1 cycle of FOLFOX and bevacizumab.   He tolerates well. Labs are reviewed and discussed with patient.  Patient will follow up in 1 week for evaluation prior to next treatment.  #Neuropathy secondary to chemotherapy, grade 1. Continue to monitor.   Recommend patient avoid cold temperature exposure.   #Eye floater,  recommend patient to see ophthalmology for further evaluation.  # Pancreatic well differentiated neuroendocrine carcinoma, lesion is 1.9 x 1.5cm Normal chromogranin A.  Pending 5 HIAA level. He will need Dotadate PET in the future. Priority now is his metastatic rectal cancer.  I discussed with him about genetic  testing and he is in agreement.  Will refer to genetic counselor.Marland Kitchen   #Uncontrolled diabetes, advised diabetic diet, continue follow-up with primary care provider for glycemic control.. All questions were answered. The patient knows to call the clinic with any problems questions or concerns.  cc Burnard Hawthorne, FNP    Return of visit: 1 week Earlie Server, MD, PhD Hematology Oncology [name] Pager- [phone] 06/11/2020

## 2020-06-12 ENCOUNTER — Ambulatory Visit: Payer: Managed Care, Other (non HMO) | Admitting: Pharmacist

## 2020-06-12 DIAGNOSIS — E119 Type 2 diabetes mellitus without complications: Secondary | ICD-10-CM

## 2020-06-12 DIAGNOSIS — I10 Essential (primary) hypertension: Secondary | ICD-10-CM

## 2020-06-12 DIAGNOSIS — Z794 Long term (current) use of insulin: Secondary | ICD-10-CM

## 2020-06-12 NOTE — Patient Instructions (Signed)
Visit Information  Patient Care Plan: Medication Management    Problem Identified: Diabetes, Cancer, HTN w/ SVT     Long-Range Goal: Disease Progression Prevention   This Visit's Progress: On track  Priority: High  Note:   Current Barriers:  . Unable to independently afford treatment regimen . Unable to achieve control of diabetes  . Complex patient with recent cancer diagnosis  Pharmacist Clinical Goal(s):  Marland Kitchen Over the next 90 days, patient will verbalize ability to afford treatment regimen. . Over the next 90 days, patient will adhere to prescribed medication regimen  Interventions: . Inter-disciplinary care team collaboration (see longitudinal plan of care) . Comprehensive medication review performed; medication list updated in electronic medical record  Diabetes: . Uncontrolled; current treatment: metformin XR 1000 mg daily, Lantus 30 units daily, Humalog ~ 10 units up to TID with meals. Sometimes holding if BG <150 before meals, but notes that this generally results in hyperglycemia after meals  . Current glucose readings: stopped using CGM d/t difficulty affording with other expenses (insulin copays, medical bills) Date of Download: 11/4-11/17/21 % Time CGM is active: 99% Average Glucose: 185 mg/dL Glucose Management Indicator: 7.7%  Glucose Variability: 30% (goal <36%) Time in Goal:  - Time in range 70-180: 49% . - Time above range: 50% - Time below range: 1% Observed patterns: post prandial hyperglycemia, increasing throughout the day . Current meal patterns: notes that he has been told to increase protein consumption . Provided empathetic listening.  . Patient will pursue savings cards for Lantus and Humalog. As he receives medications from mail order pharmacy, there is a rebate process he will need to pursue, but he notes that he will do this.  . Providing samples of Libre 2 sensors to aid in affordability in this time . Recommend not having patient hold Humalog if  pre meal sugar <150, given good appetite and need for better prandial glucose control. Recommend to continue current metformin dose at this time. He has f/u with PCP later this week.  Marland Kitchen He notes that he has an issue w/ floaters in his eyes, but he asked his ophthalmologist for a referral and he can't be seen for 3 months. Encouraged hm to discuss this w/ PCP on Friday.   Hypertension w/ SVT . Appropriately controlled given comorbidities; current treatment: telmisartan 40 mg daily, metoprolol succinate 100 mg QAM,  amlodipine 10 mg QAM; holding metoprolol succinate 25 mg QPM, HCTZ 25 mg QAM right now given hypotension. Follows w/ cardiology  . Encouraged to continue current therapy at this time and collaboration with cardiology  Malignancy: . Following closely with cancer center. Reports he has been pleased with communication. Has ondansetron, prochlorperizine, and alprazolam for symptom management  Patient Goals/Self-Care Activities . Over the next 90 days, patient will:  - take medications as prescribed Collaborate with interdisciplinary team   Follow Up Plan: Telephone follow up appointment with care management team member scheduled for: ~ 4 weeks     The patient verbalized understanding of instructions, educational materials, and care plan provided today and declined offer to receive copy of patient instructions, educational materials, and care plan.     Plan: Telephone follow up appointment with care management team member scheduled for:~ 4 weeks  Catie Darnelle Maffucci, PharmD, [place], [place] Pharmacist [place] [phone]

## 2020-06-12 NOTE — Chronic Care Management (AMB) (Signed)
Care Management   Pharmacy Note  06/12/2020 Name: Dominic [name] MRN: [id number] DOB: 06/18/1971   Subjective:  Dominic [name] is a 49 y.o. year old male who is a primary care patient of Burnard Hawthorne, FNP. The Care Management/Care Coordination team team was consulted for assistance with care management and care coordination needs.    Engaged with patient by telephone for follow up visit in response to provider referral for pharmacy case management and/or care coordination services.   Consent to Services:  [name] was given information about care management/care coordination services, agreed to services, and gave verbal consent prior to initiation of services. Please see initial visit note for detailed documentation.   Review of patient status, including review of consultants reports, laboratory and other test data, was performed as part of comprehensive evaluation and provision of chronic care management services.   SDOH (Social Determinants of Health) assessments and interventions performed:  SDOH Interventions     Most Recent Value  SDOH Interventions  Financial Strain Interventions Other (Comment)  [discussed savings cards, providing samples]       Objective:  Lab Results  Component Value Date   CREATININE 0.64 06/10/2020   CREATININE 0.73 06/03/2020   CREATININE 0.86 05/07/2020    Lab Results  Component Value Date   HGBA1C 10.2 (H) 03/07/2020       Component Value Date/Time   CHOL 226 (H) 03/07/2020 0902   TRIG 123 03/07/2020 0902   HDL 33 (L) 03/07/2020 0902   CHOLHDL 6.8 (H) 03/07/2020 0902   LDLCALC 170 (H) 03/07/2020 0902    Clinical ASCVD: No  The 10-year ASCVD risk score Dominic Bussing DC Jr., et al., 2013) is: 17.9%   Values used to calculate the score:     Age: 3 years     Sex: Male     Is Non-Hispanic African American: No     Diabetic: Yes     Tobacco smoker: No     Systolic Blood Pressure: 564 mmHg     Is BP treated: Yes     HDL Cholesterol: 33  mg/dL     Total Cholesterol: 226 mg/dL     BP Readings from Last 3 Encounters:  06/10/20 (!) 150/82  06/05/20 (!) 146/68  06/03/20 (!) 141/86    Assessment:   No Known Allergies  Medications Reviewed Today    Reviewed by De Hollingshead, RPH-CPP (Pharmacist) on 06/12/20 at 1053  Med List Status: <None>  Medication Order Taking? Sig Documenting Provider Last Dose Status Informant  0.9 %  sodium chloride infusion [id number]   Doran Stabler, MD  Active   ALPRAZolam Duanne Moron) 0.5 MG tablet [id number] Yes Take half tablet to one tablet by mouth as needed daily for insomnia, anxiety. Burnard Hawthorne, FNP Taking Active   amLODipine (NORVASC) 10 MG tablet [id number] Yes Take 1 tablet (10 mg total) by mouth daily. Loel Dubonnet, NP Taking Active            Med Note Carman Ching Jun 10, 2020  2:42 PM)    Ascorbic Acid (VITAMIN C) 1000 MG tablet [id number] Yes Take 1,000 mg by mouth daily. [provider] Taking Active   Continuous Blood Gluc Sensor (FREESTYLE LIBRE 2 SENSOR) [place] [id number] Yes Scan glucose at least QID Burnard Hawthorne, FNP Taking Active   CONTOUR NEXT TEST test strip [id number] Yes USE 1 STRIP TO TEST BLOOD  SUGAR TWICE DAILY Vidal Schwalbe Yvetta Coder, FNP Taking  Active   famotidine (PEPCID) 20 MG tablet [id number] Yes TAKE 1 TABLET BY MOUTH  TWICE DAILY Arnett, Yvetta Coder, FNP Taking Active   hydrochlorothiazide (HYDRODIURIL) 25 MG tablet [id number]  Take 25 mg by mouth daily.   Patient not taking: Reported on 05/30/2020   [provider]  Active   insulin lispro (HUMALOG KWIKPEN) 100 UNIT/ML KwikPen [id number] Yes Inject 10 units into the skin 10 minutes prior to lunch & dinner. Hold if glucose is less than 150.  Patient taking differently: 25 Units. Inject 10 units into the skin 10 minutes prior to lunch & dinner. Hold if glucose is less than 150.   Burnard Hawthorne, FNP Taking Active            Med Note Darnelle Maffucci, Arville Lime   Wed Jun 12, 2020 10:45 AM) 10 units with meals   Insulin Pen Needle (PEN NEEDLES) 32G X 6 MM MISC [id number] Yes Used to give insulin injections 4 times daily. Burnard Hawthorne, FNP Taking Active   Lancets [facility] ULTRASOFT) lancets [id number] Yes Used to check blood sugars twice daily. Burnard Hawthorne, FNP Taking Active   LANTUS SOLOSTAR 100 UNIT/ML Solostar Pen [id number] Yes INJECT SUBCUTANEOUSLY 50  UNITS DAILY  Patient taking differently: Inject 60 Units into the skin.    Burnard Hawthorne, FNP Taking Active            Med Note De Hollingshead   Wed Jun 12, 2020 10:44 AM)    lidocaine-prilocaine (EMLA) cream [id number] Yes Apply to affected area once Earlie Server, MD Taking Active   [name] [id number] Yes Take 5 mg by mouth at bedtime as needed.  [provider] Taking Active   metFORMIN (GLUCOPHAGE XR) 500 MG 24 hr tablet [id number] Yes Start 500mg  PO qpm for one week. Then the next week, take 1000mg  qhs. Burnard Hawthorne, FNP Taking Active   metoprolol succinate (TOPROL-XL) 100 MG 24 hr tablet [id number] Yes TAKE 1 TABLET BY MOUTH  DAILY WITH OR IMMEDIATELY  FOLLOWING A MEAL Caryl Bis Angela Adam, MD Taking Active   metoprolol succinate (TOPROL-XL) 25 MG 24 hr tablet [id number]  Take 1 tablet (25 mg) by mouth once daily at bedtime  Patient not taking: Reported on 05/31/2020   Loel Dubonnet, NP  Active   ondansetron (ZOFRAN ODT) 4 MG disintegrating tablet [id number]  Take 1 tablet (4 mg total) by mouth every 8 (eight) hours as needed for nausea or vomiting.  Patient not taking: Reported on 06/10/2020   Burnard Hawthorne, FNP  Active   ondansetron [facility]) 8 MG tablet [id number] Yes Take 1 tablet (8 mg total) by mouth 2 (two) times daily as needed for refractory nausea / vomiting. Start on day 3 after chemotherapy. Earlie Server, MD Taking Active   prochlorperazine (COMPAZINE) 10 MG tablet [id number] Yes Take 1 tablet (10 mg total) by mouth every 6 (six) hours as needed (Nausea or vomiting).  Earlie Server, MD Taking Active   tamsulosin [facility]) 0.4 MG CAPS capsule [id number] No Take 1 capsule (0.4 mg total) by mouth daily.  Patient not taking: Reported on 05/31/2020   Abbie Sons, MD Not Taking Active   telmisartan (MICARDIS) 40 MG tablet [id number] Yes Take 1 tablet (40 mg total) by mouth daily. PLEASE CALL OFFICE TO SCHEDULE FOLLOW UP APPOINTMENT FOR ANYMORE REFILLS. Loel Dubonnet, NP Taking Active           Patient Active Problem List   Diagnosis Date  Noted  . Neuropathy due to chemotherapeutic drug ([place]) 06/11/2020  . Uncontrolled diabetes mellitus ([place]) 06/11/2020  . Encounter for antineoplastic chemotherapy 06/03/2020  . 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 Rectal cancer metastasized to liver ([place]) 05/27/2020  . Goals of care, counseling/discussion 05/19/2020  . Dysuria 05/07/2020  . Acute prostatitis 05/07/2020  . Liver disease 05/02/2020  . Bloating 03/13/2020  . GERD (gastroesophageal reflux disease)   . Hypertriglyceridemia   . Hepatic steatosis 08/14/2019  . Unstable angina ([place]) 08/11/2019  . H/O supraventricular tachycardia 08/11/2019  . Morbid obesity ([place]) 08/11/2019  . COVID-19 07/18/2019  . Type 2 diabetes mellitus without complication, with long-term current use of insulin ([place]) 02/27/2019  . SVT (supraventricular tachycardia) ([place]) 02/27/2019  . Uncontrolled hypertension 02/27/2019  . Psoriatic arthritis ([place]) 02/27/2019    Medication Assistance: discussed savings cards for reduction in insulin cost  Patient Care Plan: Medication Management    Problem Identified: Diabetes, Cancer, HTN w/ SVT     Long-Range Goal: Disease Progression Prevention   This Visit's Progress: On track  Priority: High  Note:   Current Barriers:  . Unable to independently afford treatment regimen . Unable to achieve control of diabetes  . Complex patient with recent cancer diagnosis  Pharmacist Clinical Goal(s):  Marland Kitchen Over the next 90 days,  patient will verbalize ability to afford treatment regimen. . Over the next 90 days, patient will adhere to prescribed medication regimen  Interventions: . Inter-disciplinary care team collaboration (see longitudinal plan of care) . Comprehensive medication review performed; medication list updated in electronic medical record  Diabetes: . Uncontrolled; current treatment: metformin XR 1000 mg daily, Lantus 30 units daily, Humalog ~ 10 units up to TID with meals. Sometimes holding if BG <150 before meals, but notes that this generally results in hyperglycemia after meals  . Current glucose readings: stopped using CGM d/t difficulty affording with other expenses (insulin copays, medical bills) Date of Download: 11/4-11/17/21 % Time CGM is active: 99% Average Glucose: 185 mg/dL Glucose Management Indicator: 7.7%  Glucose Variability: 30% (goal <36%) Time in Goal:  - Time in range 70-180: 49% . - Time above range: 50% - Time below range: 1% Observed patterns: post prandial hyperglycemia, increasing throughout the day . Current meal patterns: notes that he has been told to increase protein consumption . Provided empathetic listening.  . Patient will pursue savings cards for Lantus and Humalog. As he receives medications from mail order pharmacy, there is a rebate process he will need to pursue, but he notes that he will do this.  . Providing samples of Libre 2 sensors to aid in affordability in this time . Recommend not having patient hold Humalog if pre meal sugar <150, given good appetite and need for better prandial glucose control. Recommend to continue current metformin dose at this time. He has f/u with PCP later this week.  Marland Kitchen He notes that he has an issue w/ floaters in his eyes, but he asked his ophthalmologist for a referral and he can't be seen for 3 months. Encouraged hm to discuss this w/ PCP on Friday.   Hypertension w/ SVT . Appropriately controlled given comorbidities; current  treatment: telmisartan 40 mg daily, metoprolol succinate 100 mg QAM,  amlodipine 10 mg QAM; holding metoprolol succinate 25 mg QPM, HCTZ 25 mg QAM right now given hypotension. Follows w/ cardiology  . Encouraged to continue current therapy at this time and collaboration with cardiology  Malignancy: . Following closely with cancer center. Reports  he has been pleased with communication. Has ondansetron, prochlorperizine, and alprazolam for symptom management  Patient Goals/Self-Care Activities . Over the next 90 days, patient will:  - take medications as prescribed Collaborate with interdisciplinary team   Follow Up Plan: Telephone follow up appointment with care management team member scheduled for: ~ 4 weeks      Plan: Telephone follow up appointment with care management team member scheduled for:~ 4 weeks  Catie Darnelle Maffucci, PharmD, [place], [place] Pharmacist [place] [phone]

## 2020-06-14 ENCOUNTER — Other Ambulatory Visit: Payer: Self-pay

## 2020-06-14 ENCOUNTER — Telehealth (INDEPENDENT_AMBULATORY_CARE_PROVIDER_SITE_OTHER): Payer: Managed Care, Other (non HMO) | Admitting: Family

## 2020-06-14 ENCOUNTER — Encounter: Payer: Self-pay | Admitting: Family

## 2020-06-14 VITALS — BP 148/76 | Ht 69.0 in | Wt 231.8 lb

## 2020-06-14 DIAGNOSIS — G4700 Insomnia, unspecified: Secondary | ICD-10-CM

## 2020-06-14 DIAGNOSIS — Z794 Long term (current) use of insulin: Secondary | ICD-10-CM | POA: Diagnosis not present

## 2020-06-14 DIAGNOSIS — I10 Essential (primary) hypertension: Secondary | ICD-10-CM | POA: Diagnosis not present

## 2020-06-14 DIAGNOSIS — E119 Type 2 diabetes mellitus without complications: Secondary | ICD-10-CM | POA: Diagnosis not present

## 2020-06-14 DIAGNOSIS — K219 Gastro-esophageal reflux disease without esophagitis: Secondary | ICD-10-CM

## 2020-06-14 MED ORDER — HYDROCHLOROTHIAZIDE 12.5 MG PO TABS: 12.5000 mg | ORAL_TABLET | Freq: Every day | ORAL | 1 refills | Status: DC

## 2020-06-14 MED ORDER — FAMOTIDINE 20 MG PO TABS: 20.0000 mg | ORAL_TABLET | Freq: Two times a day (BID) | ORAL | 5 refills | Status: DC

## 2020-06-14 NOTE — Assessment & Plan Note (Signed)
Elevated of late. Will resume hctz 12.5mg  with repeat bmp in one week. Continue telmisartan 40 mg daily, metoprolol succinate 100 mg QAM,  amlodipine 10 mg qd.

## 2020-06-14 NOTE — Progress Notes (Signed)
Verbal consent for services obtained from patient prior to services given to TELEPHONE visit:   Location of call:  provider at work patient at home  Names of all persons present for services: Mable Paris, NP and patient Follow up  Energy has improved, especially on the off weeks of chemotherapy. He continues to work.   DM- compliant with 50 lantus qpm, 10 units with lunch and dinner with humalog.Holding humalog if less than 150 which results in hyperglycemia.   Compliant with metformin 1000mg  qhs without diarrhea.  Average glucose 185.  FBG 90, 96.  Appetite has been good. Loosing weight and thinks it is muscle mass.  No hypoglycemic episodes.   Started chemotherapy, every other week.  BM are daily. Taking miralax daily.  Declines nutrition consult. Increasing protein in diet and eating healthier.  HTN with SVT- compliant with telmisartan 40 mg daily, metoprolol succinate 100 mg QAM,  amlodipine 10 mg QAM. He has never used metoprolol succinate 25 mg QPM.  He is currently holding HCTZ 25 mg QAM right now given hypotension though now blood pressure have been consistently in 150s past couple of weeks.  No cp, dizziness. Palpitations are less frequent.   Trouble sleeping. Has been taking half tablet of xanax with relief. He uses melatonin as well with relief. No alcohol use.    BP Readings from Last 3 Encounters:  06/14/20 (!) 148/76  06/10/20 (!) 150/82  06/05/20 (!) 146/68   Lab Results  Component Value Date   HGBA1C 10.2 (H) 03/07/2020    A/P/next steps:  Problem List Items Addressed This Visit      Cardiovascular and Mediastinum   Uncontrolled hypertension    Elevated of late. Will resume hctz 12.5mg  with repeat bmp in one week. Continue telmisartan 40 mg daily, metoprolol succinate 100 mg QAM,  amlodipine 10 mg qd.      Relevant Medications   hydrochlorothiazide (HYDRODIURIL) 12.5 MG tablet   Other Relevant Orders   Basic metabolic panel     Endocrine   Type 2  diabetes mellitus without complication, with long-term current use of insulin ([place]) - Primary    Suspect improved from prior. Pleased with FBG.  Pending a1c. No changes to regimen other than discussed with patient as Catie, PharmD, did that he may give humalog with lunch and dinner if blood sugar > 140 as long as he is eating a well balanced meal with carbohydrates. Continue 50 lantus qpm, 10 units with lunch and dinner with humalog, metformin 1000mg  hqs.       Relevant Orders   Hemoglobin A1c   Ambulatory referral to Ophthalmology     Other   Insomnia    Improved with prn , cautious use of xanax 0.5mg  , half tablet. Continue.           I spent 22 min  discussing plan of care over the phone.

## 2020-06-14 NOTE — Assessment & Plan Note (Addendum)
Suspect improved from prior. Pleased with FBG.  Pending a1c. No changes to regimen other than discussed with patient as Catie, PharmD, did that he may give humalog with lunch and dinner if blood sugar > 140 as long as he is eating a well balanced meal with carbohydrates. Continue 50 lantus qpm, 10 units with lunch and dinner with humalog, metformin 1000mg  hqs.

## 2020-06-14 NOTE — Patient Instructions (Signed)
Restart hctz 12.5mg  and monitor blood pressure.

## 2020-06-14 NOTE — Assessment & Plan Note (Signed)
Improved with prn , cautious use of xanax 0.5mg  , half tablet. Continue.

## 2020-06-14 NOTE — Progress Notes (Signed)
I have collaborated with the care management provider regarding care management and care coordination activities outlined in this encounter and have reviewed this encounter including documentation in the note and care plan. I am certifying that I agree with the content of this note and encounter as primary care provider.   Mable Paris, NP

## 2020-06-17 ENCOUNTER — Encounter: Payer: Self-pay | Admitting: Oncology

## 2020-06-17 ENCOUNTER — Inpatient Hospital Stay: Payer: Managed Care, Other (non HMO)

## 2020-06-17 ENCOUNTER — Other Ambulatory Visit: Payer: Self-pay

## 2020-06-17 ENCOUNTER — Inpatient Hospital Stay: Payer: Managed Care, Other (non HMO) | Attending: Oncology

## 2020-06-17 ENCOUNTER — Inpatient Hospital Stay (HOSPITAL_BASED_OUTPATIENT_CLINIC_OR_DEPARTMENT_OTHER): Payer: Managed Care, Other (non HMO) | Admitting: Oncology

## 2020-06-17 VITALS — BP 144/69 | HR 91 | Temp 99.1°F | Resp 18 | Wt 237.2 lb

## 2020-06-17 DIAGNOSIS — I10 Essential (primary) hypertension: Secondary | ICD-10-CM | POA: Insufficient documentation

## 2020-06-17 DIAGNOSIS — Z5111 Encounter for antineoplastic chemotherapy: Secondary | ICD-10-CM

## 2020-06-17 DIAGNOSIS — C7951 Secondary malignant neoplasm of bone: Secondary | ICD-10-CM

## 2020-06-17 DIAGNOSIS — C7A8 Other malignant neuroendocrine tumors: Secondary | ICD-10-CM | POA: Diagnosis not present

## 2020-06-17 DIAGNOSIS — Z833 Family history of diabetes mellitus: Secondary | ICD-10-CM | POA: Diagnosis not present

## 2020-06-17 DIAGNOSIS — E119 Type 2 diabetes mellitus without complications: Secondary | ICD-10-CM | POA: Diagnosis not present

## 2020-06-17 DIAGNOSIS — G620 Drug-induced polyneuropathy: Secondary | ICD-10-CM

## 2020-06-17 DIAGNOSIS — C787 Secondary malignant neoplasm of liver and intrahepatic bile duct: Secondary | ICD-10-CM

## 2020-06-17 DIAGNOSIS — Z8249 Family history of ischemic heart disease and other diseases of the circulatory system: Secondary | ICD-10-CM | POA: Insufficient documentation

## 2020-06-17 DIAGNOSIS — Z794 Long term (current) use of insulin: Secondary | ICD-10-CM | POA: Diagnosis not present

## 2020-06-17 DIAGNOSIS — T451X5A Adverse effect of antineoplastic and immunosuppressive drugs, initial encounter: Secondary | ICD-10-CM

## 2020-06-17 DIAGNOSIS — C20 Malignant neoplasm of rectum: Secondary | ICD-10-CM

## 2020-06-17 DIAGNOSIS — Z79899 Other long term (current) drug therapy: Secondary | ICD-10-CM | POA: Insufficient documentation

## 2020-06-17 DIAGNOSIS — Z800 Family history of malignant neoplasm of digestive organs: Secondary | ICD-10-CM | POA: Diagnosis not present

## 2020-06-17 DIAGNOSIS — C7A098 Malignant carcinoid tumors of other sites: Secondary | ICD-10-CM | POA: Insufficient documentation

## 2020-06-17 DIAGNOSIS — Z7189 Other specified counseling: Secondary | ICD-10-CM

## 2020-06-17 DIAGNOSIS — Z5112 Encounter for antineoplastic immunotherapy: Secondary | ICD-10-CM | POA: Insufficient documentation

## 2020-06-17 LAB — PROTEIN, URINE, RANDOM: Total Protein, Urine: <6 mg/dL

## 2020-06-17 LAB — COMPREHENSIVE METABOLIC PANEL
ALT: 18 U/L (ref 0–44)
AST: 24 U/L (ref 15–41)
Albumin: 3.2 g/dL — ABNORMAL LOW (ref 3.5–5.0)
Alkaline Phosphatase: 147 U/L — ABNORMAL HIGH (ref 38–126)
Anion gap: 5 (ref 5–15)
BUN: 15 mg/dL (ref 6–20)
CO2: 34 mmol/L — ABNORMAL HIGH (ref 22–32)
Calcium: 9.2 mg/dL (ref 8.9–10.3)
Chloride: 97 mmol/L — ABNORMAL LOW (ref 98–111)
Creatinine, Ser: 0.86 mg/dL (ref 0.61–1.24)
GFR, Estimated: >60 mL/min (ref >60)
Glucose, Bld: 208 mg/dL — ABNORMAL HIGH (ref 70–99)
Potassium: 4.3 mmol/L (ref 3.5–5.1)
Sodium: 136 mmol/L (ref 135–145)
Total Bilirubin: 0.5 mg/dL (ref 0.3–1.2)
Total Protein: 7.4 g/dL (ref 6.5–8.1)

## 2020-06-17 LAB — CBC WITH DIFFERENTIAL/PLATELET
Abs Immature Granulocytes: 0.04 10*3/uL (ref 0.00–0.07)
Basophils Absolute: 0.1 10*3/uL (ref 0.0–0.1)
Basophils Relative: 1 %
Eosinophils Absolute: 0.1 10*3/uL (ref 0.0–0.5)
Eosinophils Relative: 1 %
HCT: 37.9 % — ABNORMAL LOW (ref 39.0–52.0)
Hemoglobin: 12.3 g/dL — ABNORMAL LOW (ref 13.0–17.0)
Immature Granulocytes: 0 %
Lymphocytes Relative: 18 %
Lymphs Abs: 1.7 10*3/uL (ref 0.7–4.0)
MCH: 26.2 pg (ref 26.0–34.0)
MCHC: 32.5 g/dL (ref 30.0–36.0)
MCV: 80.8 fL (ref 80.0–100.0)
Monocytes Absolute: 0.7 10*3/uL (ref 0.1–1.0)
Monocytes Relative: 7 %
Neutro Abs: 6.7 10*3/uL (ref 1.7–7.7)
Neutrophils Relative %: 73 %
Platelets: 407 10*3/uL — ABNORMAL HIGH (ref 150–400)
RBC: 4.69 MIL/uL (ref 4.22–5.81)
RDW: 14.6 % (ref 11.5–15.5)
WBC: 9.3 10*3/uL (ref 4.0–10.5)
nRBC: 0 % (ref 0.0–0.2)

## 2020-06-17 MED ORDER — LEUCOVORIN CALCIUM INJECTION 100 MG
20.0000 mg/m2 | Freq: Once | INTRAMUSCULAR | Status: AC
  Administered 2020-06-17: 46 mg via INTRAVENOUS
  Filled 2020-06-17: qty 2.3

## 2020-06-17 MED ORDER — SODIUM CHLORIDE 0.9 % IV SOLN
10.0000 mg | Freq: Once | INTRAVENOUS | Status: AC
  Administered 2020-06-17: 10 mg via INTRAVENOUS
  Filled 2020-06-17: qty 10

## 2020-06-17 MED ORDER — SODIUM CHLORIDE 0.9 % IV SOLN
Freq: Once | INTRAVENOUS | Status: AC
  Filled 2020-06-17: qty 250

## 2020-06-17 MED ORDER — FLUOROURACIL CHEMO INJECTION 2.5 GM/50ML
400.0000 mg/m2 | Freq: Once | INTRAVENOUS | Status: AC
  Administered 2020-06-17: 900 mg via INTRAVENOUS
  Filled 2020-06-17: qty 18

## 2020-06-17 MED ORDER — SODIUM CHLORIDE 0.9% FLUSH
10.0000 mL | INTRAVENOUS | Status: DC | PRN
  Administered 2020-06-17: 10 mL via INTRAVENOUS
  Filled 2020-06-17: qty 10

## 2020-06-17 MED ORDER — OXALIPLATIN CHEMO INJECTION 100 MG/20ML
200.0000 mg | Freq: Once | INTRAVENOUS | Status: AC
  Administered 2020-06-17: 200 mg via INTRAVENOUS
  Filled 2020-06-17: qty 40

## 2020-06-17 MED ORDER — LEUCOVORIN CALCIUM INJECTION 350 MG
400.0000 mg/m2 | Freq: Once | INTRAVENOUS | Status: DC
  Filled 2020-06-17: qty 46.2

## 2020-06-17 MED ORDER — DEXTROSE 5 % IV SOLN
Freq: Once | INTRAVENOUS | Status: AC
  Filled 2020-06-17: qty 250

## 2020-06-17 MED ORDER — PALONOSETRON HCL INJECTION 0.25 MG/5ML
0.2500 mg | Freq: Once | INTRAVENOUS | Status: AC
  Administered 2020-06-17: 0.25 mg via INTRAVENOUS
  Filled 2020-06-17: qty 5

## 2020-06-17 MED ORDER — SODIUM CHLORIDE 0.9 % IV SOLN
2400.0000 mg/m2 | INTRAVENOUS | Status: DC
  Administered 2020-06-17: 5550 mg via INTRAVENOUS
  Filled 2020-06-17: qty 111

## 2020-06-17 MED ORDER — SODIUM CHLORIDE 0.9 % IV SOLN
5.0000 mg/kg | Freq: Once | INTRAVENOUS | Status: AC
  Administered 2020-06-17: 500 mg via INTRAVENOUS
  Filled 2020-06-17: qty 16

## 2020-06-17 NOTE — Progress Notes (Signed)
Patient would like MD to check the port a cath incision site.  He is scheduled for an eye exam this Th.  Has chronic constipation that is regulated with 1/2 cap of Miralax.  Does have occasional neuropathy.

## 2020-06-17 NOTE — Progress Notes (Signed)
Patient tolerated infusion well. Discharged home.  

## 2020-06-17 NOTE — Progress Notes (Signed)
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  male with PMH listed below was seen in consultation at the request of  Burnard Hawthorne, FNP  for evaluation of liver and pancreatic mass  #reports symptoms of upper abdomen band like discomfort and bloating for a few months, improved symptoms with omeprazole,.Chronic constipation, he has blood in the stool occationally Seen by PCP in September 2021. AlK phosphatase was recently noted to be elevated as well well elevated GGT. He was referred to see gastroenteralgias on 04/10/2020.  04/25/2020 EGD showed gastric fundus mild inflammation. Biopsy showed reactive gastropathy.  04/30/2020 [place] RUQ showed cirrhosis with multiple hepatic masses.  05/01/2020 AFP 1.7 05/02/2020 MR liver w wo contrast showed numerous heterozygous enhancing liver lesions, throughout the liver, not typical for [facility], likely metastatic disease.  Solid appearing enhancing lesion in pancreatic tail 1.9x1.5cm, suspicious for primary neoplasm.  Mild adenopathy within porta hepatic region and porta hepatic region. Splenomegaly.   Patient has history of SVT, psoriatic arthritis, morbid obesity, GERD, DM.  Family history + maternal uncle with esophageal cancer.   + unintentional weight loss, no fever, chills. "sweats a lot". Currently on antibiotics for proctitis.  He works at [employer], lives in [place]. He lives with his partner.  During today's visit, he felt  dizziness and lightheaded when sitting on the examination table.  He lost consciousness briefly for about 2-3 minutes, regain consciousness spontaneously, perfusing sweating. SBP in 90s, and HR initially in 30-40s, later improved to 70s.  He denies any chest pain, SOB. Blood sugar was 207.   # 05/17/2020, colonoscopy showed a fungating and infiltrative partially obstructing large mass in the proximal rectum and in the mid rectum.  The mass was partially circumferential, involving two thirds of the lumen circumference is.  Biopsy showed adenocarcinoma.  05/23/2020, patient underwent EUS biopsy of the pancreatic tail lesion.  Biopsy pathology showed well-differentiated neuroendocrine tumor.  Ki-67 is noncontributory due to insufficient tissue in the cell block.  INTERVAL HISTORY Dominic [name] is a 49 y.o. 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 Problems and complaints are listed below: Status post cycle 1 FOLFOX with bevacizumab.   He tolerates well. Mild nausea for which antiemetics are helpful.  No vomiting, diarrhea. No chest pain, shortness of breath.  Incision site of the medi port has some redness and he wants to have it checked.   Review of Systems  Constitutional: Positive for fatigue and unexpected weight change. Negative for appetite change, chills and fever.  HENT:   Negative for hearing loss and voice change.   Eyes: Negative for eye problems and icterus.  Respiratory: Negative for chest tightness, cough and shortness of breath.   Cardiovascular: Negative for chest pain and leg swelling.  Gastrointestinal: Negative for abdominal distention and abdominal pain.  Endocrine: Negative for hot flashes.  Genitourinary: Negative for difficulty urinating, dysuria and frequency.   Musculoskeletal: Negative for arthralgias.  Skin: Negative for itching and rash.  Neurological: Positive for numbness. Negative for light-headedness.  Hematological: Negative for adenopathy. Does not bruise/bleed easily.  Psychiatric/Behavioral: Negative for confusion.    MEDICAL HISTORY:  Past Medical History:  Diagnosis Date  . Anxiety   . Cancer [facility])    Rectal cancer metastasized to liver ([place])  . Diabetes mellitus without complication ([place])   . Dysrhythmia    svt  . GERD (gastroesophageal reflux disease)   . High triglycerides   . Hypertension   . Long-term insulin use in type 2 diabetes ([place])   . Morbid obesity ([place])   . Psoriatic arthritis ([place])   . SVT (supraventricular tachycardia) ([place])     SURGICAL HISTORY: Past Surgical History:  Procedure Laterality Date  . EUS N/A 05/23/2020   Procedure: FULL UPPER ENDOSCOPIC ULTRASOUND (EUS) RADIAL;  Surgeon: Jola Schmidt, MD;  Location: [facility] ENDOSCOPY;  Service: Endoscopy;  Laterality: N/A;  . PORTA CATH INSERTION N/A 05/30/2020   Procedure: PORTA CATH INSERTION;  Surgeon: Algernon Huxley, MD;  Location: [facility];  Service: Cardiovascular;  Laterality: N/A;  . UPPER GASTROINTESTINAL ENDOSCOPY      SOCIAL HISTORY: Social History   Socioeconomic History  . Marital status: Soil scientist    Spouse name: Marc Morgans  . Number of children: Not on file  . Years of education: Not on file  . Highest education level: Not on file  Occupational History  . Not on file  Tobacco Use  . Smoking status: Never Smoker  . Smokeless tobacco: Never Used  Vaping Use  . Vaping Use: Never used  Substance and Sexual Activity  . Alcohol use: Yes    Alcohol/week: 1.0 standard drink    Types: 1 Glasses of wine per week    Comment: very occasional  . Drug use: Never  . Sexual activity: Not on file  Other Topics Concern  . Not on file  Social History Narrative   Lives in [place] partner [place].  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     Social Determinants of Health   Financial Resource Strain: Medium Risk  . Difficulty  of Paying Living Expenses: Somewhat hard  Food Insecurity:   . Worried About Charity fundraiser in the Last Year: Not on file  . Ran Out of Food in the Last Year: Not on file  Transportation Needs:   . Lack of Transportation (Medical): Not on file  . Lack of Transportation (Non-Medical): Not on file  Physical Activity:   . Days of Exercise per Week: Not on file  . Minutes of Exercise per Session: Not on file  Stress:   . Feeling of Stress : Not on file  Social Connections:   . Frequency of Communication with Friends and Family: Not on file  . Frequency of Social Gatherings with Friends and Family: Not on file  . Attends Religious Services: Not on file  . Active Member of Clubs or Organizations: Not on file  . Attends Archivist Meetings: Not on file  . Marital Status: Not on file  Intimate Partner Violence:   . Fear of Current or Ex-Partner: Not on file  . Emotionally Abused: Not on file  . Physically Abused: Not on file  . Sexually Abused: Not on file    FAMILY HISTORY: Family History  Problem Relation Age of Onset  . Arthritis Mother   . Heart disease Mother   . Supraventricular tachycardia Mother        s/p ablation  . Hypertension Mother   .  Diabetes Father   . Heart attack Maternal Uncle   . Throat cancer Maternal Uncle   . Esophageal cancer Maternal Uncle   . Heart attack Maternal Uncle   . Heart attack Maternal Uncle   . Prostate cancer Neg Hx   . Thyroid cancer Neg Hx   . Colon cancer Neg Hx   . Rectal cancer Neg Hx   . Stomach cancer Neg Hx   . Colon polyps Neg Hx     ALLERGIES:  has No Known Allergies.  MEDICATIONS:  Current Outpatient Medications  Medication Sig Dispense Refill  . ALPRAZolam (XANAX) 0.5 MG tablet Take half tablet to one tablet by mouth as needed daily for insomnia, anxiety. 30 tablet 0  . amLODipine (NORVASC) 10 MG tablet Take 1 tablet (10 mg total) by mouth daily. 90 tablet 2  . Continuous Blood Gluc Sensor (FREESTYLE  LIBRE 2 SENSOR) MISC Scan glucose at least QID 2 each 11  . CONTOUR NEXT TEST test strip USE 1 STRIP TO TEST BLOOD  SUGAR TWICE DAILY 200 strip 3  . famotidine (PEPCID) 20 MG tablet Take 1 tablet (20 mg total) by mouth 2 (two) times daily. 120 tablet 5  . hydrochlorothiazide (HYDRODIURIL) 12.5 MG tablet Take 1 tablet (12.5 mg total) by mouth daily. (Patient taking differently: Take 12.5 mg by mouth daily. 1/2 QD) 90 tablet 1  . insulin lispro (HUMALOG KWIKPEN) 100 UNIT/ML KwikPen Inject 10 units into the skin 10 minutes prior to lunch & dinner. Hold if glucose is less than 150. (Patient taking differently: 10 Units. Inject 10 units into the skin 10 minutes prior to lunch & dinner. Hold if glucose is less than 150.) 15 mL 5  . Insulin Pen Needle (PEN NEEDLES) 32G X 6 MM MISC Used to give insulin injections 4 times daily. 100 each 10  . Lancets (ONETOUCH ULTRASOFT) lancets Used to check blood sugars twice daily. 100 each 12  . LANTUS SOLOSTAR 100 UNIT/ML Solostar Pen INJECT SUBCUTANEOUSLY 50  UNITS DAILY (Patient taking differently: Inject 50 Units into the skin. ) 45 mL 3  . lidocaine-prilocaine (EMLA) cream Apply to affected area once 30 g 3  . [name] Take 5 mg by mouth at bedtime as needed.     . metFORMIN (GLUCOPHAGE XR) 500 MG 24 hr tablet Start $RemoveBefo'500mg'OnqmfCQVckF$  PO qpm for one week. Then the next week, take $RemoveBe'1000mg'vkGUSghpu$  qhs. (Patient taking differently: Take 1,000 mg by mouth. Take two tablets ($RemoveBefo'1000mg'bovegVmMrSE$ ) qhs.) 90 tablet 3  . metoprolol succinate (TOPROL-XL) 100 MG 24 hr tablet TAKE 1 TABLET BY MOUTH  DAILY WITH OR IMMEDIATELY  FOLLOWING A MEAL 90 tablet 3  . ondansetron (ZOFRAN) 8 MG tablet Take 1 tablet (8 mg total) by mouth 2 (two) times daily as needed for refractory nausea / vomiting. Start on day 3 after chemotherapy. 30 tablet 1  . polyethylene glycol powder (GLYCOLAX/MIRALAX) 17 GM/SCOOP powder Take by mouth daily. 1/2 capful QD    . tamsulosin (FLOMAX) 0.4 MG CAPS capsule Take 1 capsule (0.4 mg total) by  mouth daily. 30 capsule 1  . telmisartan (MICARDIS) 40 MG tablet Take 1 tablet (40 mg total) by mouth daily. PLEASE CALL OFFICE TO SCHEDULE FOLLOW UP APPOINTMENT FOR ANYMORE REFILLS. 90 tablet 0  . Ascorbic Acid (VITAMIN C) 1000 MG tablet Take 1,000 mg by mouth daily. (Patient not taking: Reported on 06/17/2020)    . ondansetron (ZOFRAN ODT) 4 MG disintegrating tablet Take 1 tablet (4 mg total) by mouth every 8 (  eight) hours as needed for nausea or vomiting. (Patient not taking: Reported on 06/17/2020) 30 tablet 2  . prochlorperazine (COMPAZINE) 10 MG tablet Take 1 tablet (10 mg total) by mouth every 6 (six) hours as needed (Nausea or vomiting). (Patient not taking: Reported on 06/17/2020) 30 tablet 1   Current Facility-Administered Medications  Medication Dose Route Frequency Provider Last Rate Last Admin  . 0.9 %  sodium chloride infusion  500 mL Intravenous Once Doran Stabler, MD       Facility-Administered Medications Ordered in Other Visits  Medication Dose Route Frequency Provider Last [place]  . fluorouracil (ADRUCIL) 5,550 mg in sodium chloride 0.9 % 139 mL chemo infusion  2,400 mg/m2 (Treatment Plan Recorded) Intravenous 1 day or 1 dose Earlie Server, MD   5,550 mg at 06/17/20 1423  . sodium chloride flush (NS) 0.9 % injection 10 mL  10 mL Intravenous PRN Earlie Server, MD   10 mL at 06/17/20 0831     PHYSICAL EXAMINATION: ECOG PERFORMANCE STATUS: 1 - Symptomatic but completely ambulatory Vitals:   06/17/20 0903  BP: (!) 144/69  Pulse: 91  Resp: 18  Temp: 99.1 F (37.3 C)   Filed Weights   06/17/20 0903  Weight: 237 lb 3.2 oz (107.6 kg)    Physical Exam Constitutional:      General: He is not in acute distress. HENT:     Head: Normocephalic and atraumatic.  Eyes:     General: No scleral icterus. Cardiovascular:     Rate and Rhythm: Normal rate and regular rhythm.     Heart sounds: Normal heart sounds.  Pulmonary:     Effort: Pulmonary effort is normal. No  respiratory distress.     Breath sounds: No wheezing.  Abdominal:     General: Bowel sounds are normal. There is no distension.     Palpations: Abdomen is soft.  Musculoskeletal:        General: No deformity. Normal range of motion.     Cervical back: Normal range of motion and neck supple.  Skin:    General: Skin is warm and dry.     Findings: No erythema or rash.  Neurological:     Mental Status: He is alert and oriented to person, place, and time. Mental status is at baseline.     Cranial Nerves: No cranial nerve deficit.     Coordination: Coordination normal.  Psychiatric:        Mood and Affect: Mood normal.     LABORATORY DATA:  I have reviewed the data as listed Lab Results  Component Value Date   WBC 9.3 06/17/2020   HGB 12.3 (L) 06/17/2020   HCT 37.9 (L) 06/17/2020   MCV 80.8 06/17/2020   PLT 407 (H) 06/17/2020   Recent Labs    10/18/19 1444 03/07/20 0902 03/07/20 0902 03/13/20 1255 04/10/20 1021 04/25/20 0942 05/07/20 0933 06/03/20 0815 06/10/20 1403 06/17/20 0819  NA   < > 135   < >  --   --   --  136 130* 134* 136  K   < > 4.5   < >  --   --   --  4.4 4.4 4.0 4.3  CL   < > 94*   < >  --   --   --  98 93* 98 97*  CO2   < > 25   < >  --   --   --  21 27 23  34*  GLUCOSE   < > 109*   < >  --   --   --  175* 307* 198* 208*  BUN   < > 13   < >  --  18  --  12 12 17 15   CREATININE   < > 0.79   < >  --  0.91  --  0.86 0.73 0.64 0.86  CALCIUM   < > 9.4   < >  --   --   --  9.2 8.9 8.8* 9.2  GFRNONAA  --  105   < >  --  98  --  101 >60 >60 >60  GFRAA  --  121  --   --  113  --  117  --   --   --   PROT  --  7.6   < >  --   --  6.5  --  7.7 7.8 7.4  ALBUMIN  --  4.2   < >  --   --  3.1*  --  3.1* 3.0* 3.2*  AST   < > 27   < >  --   --  18  --  44* 28 24  ALT   < > 25   < >  --   --  17  --  32 25 18  ALKPHOS   < > 179*   < >   < >  --  215*  --  234* 174* 147*  BILITOT  --  0.4   < >  --   --  0.7  --  1.1 0.7 0.5  BILIDIR  --   --   --   --   --  0.47*  --    --   --   --    < > = values in this interval not displayed.   Iron/TIBC/Ferritin/ %Sat No results found for: IRON, TIBC, FERRITIN, IRONPCTSAT    RADIOGRAPHIC STUDIES: I have personally reviewed the radiological images as listed and agreed with the findings in the report. PERIPHERAL VASCULAR CATHETERIZATION  Result Date: 05/30/2020 See op note     ASSESSMENT & PLAN:  1. Rectal cancer metastasized to liver ([place])   2. Neuroendocrine carcinoma of pancreas ([place])   3. Neuropathy due to chemotherapeutic drug ([place])   4. Encounter for antineoplastic chemotherapy   Cancer Staging Rectal cancer metastasized to liver [facility]) Staging form: Colon and Rectum, AJCC 8th Edition - Clinical stage from 06/03/2020: Stage Unknown (cTX, cNX, pM1) - Signed by Earlie Server, MD on 06/03/2020   #Stage IV rectal cancer S/p 1 cycle of FOLFOX and bevacizumab.   He tolerates well. Labs are reviewed and discussed with patient. Proceed with cycle 2 FOLFOX with bevacizumab.    #Neuropathy secondary to chemotherapy, grade 1. Monitor.   Recommend patient avoid cold temperature exposure.  # Pancreatic well differentiated neuroendocrine carcinoma, lesion is 1.9 x 1.5cm Normal chromogranin A.  Pending 5 HIAA level. He will need Dotadate PET in the future. Priority now is his metastatic rectal cancer.  I discussed with him about genetic testing and he is in agreement.  Will refer to genetic counselor.Marland Kitchen   #Uncontrolled diabetes, advised diabetic diet, continue follow-up with primary care provider for glycemic control.. All questions were answered. The patient knows to call the clinic with any problems questions or concerns.  cc Burnard Hawthorne, FNP    Return of visit: 2weeks Earlie Server, MD, PhD Hematology Oncology [facility] [place]- [phone] 06/17/2020

## 2020-06-18 LAB — 5 HIAA, QUANTITATIVE, URINE, 24 HOUR
5-HIAA, Ur: 5 mg/L
5-HIAA,Quant.,24 Hr Urine: 11.3 mg/(24.h) (ref 0.0–14.9)
Total Volume: 2250

## 2020-06-18 LAB — CEA: CEA: 880 ng/mL — ABNORMAL HIGH (ref 0.0–4.7)

## 2020-06-19 ENCOUNTER — Inpatient Hospital Stay: Payer: Managed Care, Other (non HMO)

## 2020-06-19 DIAGNOSIS — Z5112 Encounter for antineoplastic immunotherapy: Secondary | ICD-10-CM | POA: Diagnosis not present

## 2020-06-19 DIAGNOSIS — C20 Malignant neoplasm of rectum: Secondary | ICD-10-CM

## 2020-06-19 MED ORDER — HEPARIN SOD (PORK) LOCK FLUSH 100 UNIT/ML IV SOLN
500.0000 [IU] | Freq: Once | INTRAVENOUS | Status: AC | PRN
  Administered 2020-06-19: 500 [IU]
  Filled 2020-06-19: qty 5

## 2020-06-19 MED ORDER — SODIUM CHLORIDE 0.9% FLUSH
10.0000 mL | INTRAVENOUS | Status: DC | PRN
  Administered 2020-06-19: 10 mL
  Filled 2020-06-19: qty 10

## 2020-06-19 MED ORDER — HEPARIN SOD (PORK) LOCK FLUSH 100 UNIT/ML IV SOLN
INTRAVENOUS | Status: AC
  Filled 2020-06-19: qty 5

## 2020-06-20 LAB — HM DIABETES EYE EXAM

## 2020-06-20 NOTE — Addendum Note (Signed)
Addended by: Leeanne Rio on: 06/20/2020 04:17 PM   Modules accepted: Orders

## 2020-06-21 ENCOUNTER — Other Ambulatory Visit: Payer: Self-pay

## 2020-06-21 ENCOUNTER — Other Ambulatory Visit (INDEPENDENT_AMBULATORY_CARE_PROVIDER_SITE_OTHER): Payer: Managed Care, Other (non HMO)

## 2020-06-21 DIAGNOSIS — I10 Essential (primary) hypertension: Secondary | ICD-10-CM | POA: Diagnosis not present

## 2020-06-21 DIAGNOSIS — Z794 Long term (current) use of insulin: Secondary | ICD-10-CM

## 2020-06-21 DIAGNOSIS — E119 Type 2 diabetes mellitus without complications: Secondary | ICD-10-CM

## 2020-06-24 ENCOUNTER — Telehealth: Payer: Self-pay | Admitting: Family

## 2020-06-24 LAB — BASIC METABOLIC PANEL
BUN/Creatinine Ratio: 14 (ref 9–20)
BUN: 11 mg/dL (ref 6–24)
CO2: 22 mmol/L (ref 20–29)
Calcium: 9.2 mg/dL (ref 8.7–10.2)
Chloride: 95 mmol/L — ABNORMAL LOW (ref 96–106)
Creatinine, Ser: 0.8 mg/dL (ref 0.76–1.27)
GFR calc Af Amer: 120 mL/min/{1.73_m2} (ref >59)
GFR calc non Af Amer: 104 mL/min/{1.73_m2} (ref >59)
Glucose: 202 mg/dL — ABNORMAL HIGH (ref 65–99)
Potassium: 4.8 mmol/L (ref 3.5–5.2)
Sodium: 135 mmol/L (ref 134–144)

## 2020-06-24 LAB — HEMOGLOBIN A1C
Est. average glucose Bld gHb Est-mCnc: 192 mg/dL
Hgb A1c MFr Bld: 8.3 % — ABNORMAL HIGH (ref 4.8–5.6)

## 2020-06-24 NOTE — Telephone Encounter (Signed)
I called and the test is still pending at this time.

## 2020-06-24 NOTE — Telephone Encounter (Signed)
Waiting on a1c test- can you check?

## 2020-06-25 ENCOUNTER — Other Ambulatory Visit: Payer: Self-pay | Admitting: Family

## 2020-06-25 NOTE — Telephone Encounter (Signed)
Rx request sent to pharmacy.  

## 2020-06-28 ENCOUNTER — Encounter: Payer: Self-pay | Admitting: Oncology

## 2020-06-28 ENCOUNTER — Inpatient Hospital Stay: Payer: Managed Care, Other (non HMO)

## 2020-06-28 ENCOUNTER — Inpatient Hospital Stay (HOSPITAL_BASED_OUTPATIENT_CLINIC_OR_DEPARTMENT_OTHER): Payer: Managed Care, Other (non HMO) | Admitting: Oncology

## 2020-06-28 VITALS — BP 157/79 | HR 88 | Temp 98.3°F | Resp 18 | Wt 239.5 lb

## 2020-06-28 DIAGNOSIS — C787 Secondary malignant neoplasm of liver and intrahepatic bile duct: Secondary | ICD-10-CM

## 2020-06-28 DIAGNOSIS — Z5111 Encounter for antineoplastic chemotherapy: Secondary | ICD-10-CM | POA: Diagnosis not present

## 2020-06-28 DIAGNOSIS — C7A8 Other malignant neuroendocrine tumors: Secondary | ICD-10-CM | POA: Diagnosis not present

## 2020-06-28 DIAGNOSIS — C20 Malignant neoplasm of rectum: Secondary | ICD-10-CM | POA: Diagnosis not present

## 2020-06-28 DIAGNOSIS — C7951 Secondary malignant neoplasm of bone: Secondary | ICD-10-CM

## 2020-06-28 DIAGNOSIS — T451X5A Adverse effect of antineoplastic and immunosuppressive drugs, initial encounter: Secondary | ICD-10-CM

## 2020-06-28 DIAGNOSIS — G620 Drug-induced polyneuropathy: Secondary | ICD-10-CM

## 2020-06-28 DIAGNOSIS — Z5112 Encounter for antineoplastic immunotherapy: Secondary | ICD-10-CM | POA: Diagnosis not present

## 2020-06-28 DIAGNOSIS — E1365 Other specified diabetes mellitus with hyperglycemia: Secondary | ICD-10-CM

## 2020-06-28 LAB — COMPREHENSIVE METABOLIC PANEL
ALT: 15 U/L (ref 0–44)
AST: 18 U/L (ref 15–41)
Albumin: 3.6 g/dL (ref 3.5–5.0)
Alkaline Phosphatase: 112 U/L (ref 38–126)
Anion gap: 8 (ref 5–15)
BUN: 15 mg/dL (ref 6–20)
CO2: 27 mmol/L (ref 22–32)
Calcium: 9.1 mg/dL (ref 8.9–10.3)
Chloride: 99 mmol/L (ref 98–111)
Creatinine, Ser: 0.75 mg/dL (ref 0.61–1.24)
GFR, Estimated: >60 mL/min (ref >60)
Glucose, Bld: 365 mg/dL — ABNORMAL HIGH (ref 70–99)
Potassium: 4.1 mmol/L (ref 3.5–5.1)
Sodium: 134 mmol/L — ABNORMAL LOW (ref 135–145)
Total Bilirubin: 0.5 mg/dL (ref 0.3–1.2)
Total Protein: 7.4 g/dL (ref 6.5–8.1)

## 2020-06-28 LAB — CBC WITH DIFFERENTIAL/PLATELET
Abs Immature Granulocytes: 0.04 10*3/uL (ref 0.00–0.07)
Basophils Absolute: 0 10*3/uL (ref 0.0–0.1)
Basophils Relative: 0 %
Eosinophils Absolute: 0.2 10*3/uL (ref 0.0–0.5)
Eosinophils Relative: 2 %
HCT: 41.8 % (ref 39.0–52.0)
Hemoglobin: 13.8 g/dL (ref 13.0–17.0)
Immature Granulocytes: 0 %
Lymphocytes Relative: 17 %
Lymphs Abs: 1.5 10*3/uL (ref 0.7–4.0)
MCH: 26.5 pg (ref 26.0–34.0)
MCHC: 33 g/dL (ref 30.0–36.0)
MCV: 80.2 fL (ref 80.0–100.0)
Monocytes Absolute: 0.8 10*3/uL (ref 0.1–1.0)
Monocytes Relative: 9 %
Neutro Abs: 6.4 10*3/uL (ref 1.7–7.7)
Neutrophils Relative %: 72 %
Platelets: 238 10*3/uL (ref 150–400)
RBC: 5.21 MIL/uL (ref 4.22–5.81)
RDW: 15.1 % (ref 11.5–15.5)
WBC: 8.9 10*3/uL (ref 4.0–10.5)
nRBC: 0 % (ref 0.0–0.2)

## 2020-06-28 NOTE — Progress Notes (Signed)
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  male with PMH listed below was seen in consultation at the request of  Burnard Hawthorne, FNP  for evaluation of liver and pancreatic mass  #reports symptoms of upper abdomen band like discomfort and bloating for a few months, improved symptoms with omeprazole,.Chronic constipation, he has blood in the stool occationally Seen by PCP in September 2021. AlK phosphatase was recently noted to be elevated as well well elevated GGT. He was referred to see gastroenteralgias on 04/10/2020.  04/25/2020 EGD showed gastric fundus mild inflammation. Biopsy showed reactive gastropathy.  04/30/2020 [place] RUQ showed cirrhosis with multiple hepatic masses.  05/01/2020 AFP 1.7 05/02/2020 MR liver w wo contrast showed numerous heterozygous enhancing liver lesions, throughout the liver, not typical for [facility], likely metastatic disease.  Solid appearing enhancing lesion in pancreatic tail 1.9x1.5cm, suspicious for primary neoplasm.  Mild adenopathy within porta hepatic region and porta hepatic region. Splenomegaly.   Patient has history of SVT, psoriatic arthritis, morbid obesity, GERD, DM.  Family history + maternal uncle with esophageal cancer.   + unintentional weight loss, no fever, chills. "sweats a lot". Currently on antibiotics for proctitis.  He works at [employer], lives in [place]. He lives with his partner.  During today's visit, he felt  dizziness and lightheaded when sitting on the examination table.  He lost consciousness briefly for about 2-3 minutes, regain consciousness spontaneously, perfusing sweating. SBP in 90s, and HR initially in 30-40s, later improved to 70s.  He denies any chest pain, SOB. Blood sugar was 207.   # 05/17/2020, colonoscopy showed a fungating and infiltrative partially obstructing large mass in the proximal rectum and in the mid rectum.  The mass was partially circumferential, involving two thirds of the lumen circumference is.  Biopsy showed adenocarcinoma.  05/23/2020, patient underwent EUS biopsy of the pancreatic tail lesion.  Biopsy pathology showed well-differentiated neuroendocrine tumor.  Ki-67 is noncontributory due to insufficient tissue in the cell block.  INTERVAL HISTORY Dominic [name] is a 49 y.o. 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 Problems and complaints are listed below: Status post cycle 2 FOLFOX with bevacizumab.  Overall he tolerates well Denies any nausea vomiting diarrhea pain He reports intermittent pain to the rectal area and stiffening to hip area. He has history of psoriasis and had been on Humira previously.  He reports that his last dose was a few months ago.  He has not followed up with his rheumatologist for quite some time.  No flare of skin rashes Appetite is fair.  He ate sugar containing food this morning and he says he will not be surprised if his today sugar level is high.  Weight is stable. Intermittent numbness tingling.  Not worse  Review of Systems  Constitutional: Positive for fatigue. Negative for appetite change, chills, fever and unexpected weight change.  HENT:   Negative for hearing loss and voice change.   Eyes: Negative for eye problems  and icterus.  Respiratory: Negative for chest tightness, cough and shortness of breath.    Cardiovascular: Negative for chest pain and leg swelling.  Gastrointestinal: Negative for abdominal distention and abdominal pain.  Endocrine: Negative for hot flashes.  Genitourinary: Negative for difficulty urinating, dysuria and frequency.   Musculoskeletal: Negative for arthralgias.  Skin: Negative for itching and rash.  Neurological: Positive for numbness. Negative for light-headedness.  Hematological: Negative for adenopathy. Does not bruise/bleed easily.  Psychiatric/Behavioral: Negative for confusion.    MEDICAL HISTORY:  Past Medical History:  Diagnosis Date  . Anxiety   . Cancer [facility])    Rectal cancer metastasized to liver ([place])  . Diabetes mellitus without complication ([place])   . Dysrhythmia    svt  . GERD (gastroesophageal reflux disease)   . High triglycerides   . Hypertension   . Long-term insulin use in type 2 diabetes ([place])   . Morbid obesity ([place])   . Psoriatic arthritis ([place])   . SVT (supraventricular tachycardia) ([place])     SURGICAL HISTORY: Past Surgical History:  Procedure Laterality Date  . EUS N/A 05/23/2020   Procedure: FULL UPPER ENDOSCOPIC ULTRASOUND (EUS) RADIAL;  Surgeon: Jola Schmidt, MD;  Location: [facility] ENDOSCOPY;  Service: Endoscopy;  Laterality: N/A;  . PORTA CATH INSERTION N/A 05/30/2020   Procedure: PORTA CATH INSERTION;  Surgeon: Algernon Huxley, MD;  Location: [facility];  Service: Cardiovascular;  Laterality: N/A;  . UPPER GASTROINTESTINAL ENDOSCOPY      SOCIAL HISTORY: Social History   Socioeconomic History  . Marital status: Soil scientist    Spouse name: Marc Morgans  . Number of children: Not on file  . Years of education: Not on file  . Highest education level: Not on file  Occupational History  . Not on file  Tobacco Use  . Smoking status: Never Smoker  . Smokeless tobacco: Never Used  Vaping Use  . Vaping Use: Never used  Substance and Sexual Activity  . Alcohol use: Yes    Alcohol/week: 1.0 standard drink     Types: 1 Glasses of wine per week    Comment: very occasional  . Drug use: Never  . Sexual activity: Not on file  Other Topics Concern  . Not on file  Social History Narrative   Lives in [place] partner [place].  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     Social Determinants of Health   Financial Resource Strain: Medium Risk  . Difficulty of Paying Living Expenses: Somewhat hard  Food Insecurity: Not on file  Transportation Needs: Not on file  Physical Activity: Not on file  Stress: Not on file  Social Connections: Not on file  Intimate Partner Violence: Not on file    FAMILY HISTORY: Family History  Problem Relation Age of Onset  . Arthritis Mother   . Heart disease Mother   . Supraventricular tachycardia Mother        s/p ablation  . Hypertension Mother   . Diabetes Father   . Heart attack Maternal Uncle   . Throat cancer Maternal Uncle   . Esophageal cancer Maternal Uncle   . Heart attack Maternal Uncle   . Heart attack Maternal Uncle   . Prostate cancer Neg Hx   . Thyroid cancer Neg Hx   . Colon cancer Neg Hx   . Rectal cancer Neg Hx   . Stomach cancer Neg Hx   . Colon polyps Neg Hx     ALLERGIES:  has No Known Allergies.  MEDICATIONS:  Current Outpatient Medications  Medication Sig Dispense Refill  . ALPRAZolam (XANAX) 0.5 MG tablet Take half tablet to one tablet by mouth as needed daily for insomnia, anxiety. 30 tablet 0  . amLODipine (NORVASC) 10 MG tablet TAKE 1 TABLET BY MOUTH  DAILY 90 tablet 3  . Continuous Blood Gluc Sensor (FREESTYLE LIBRE 2 SENSOR) MISC Scan glucose at least QID 2 each 11  . CONTOUR NEXT TEST test strip USE 1 STRIP TO TEST BLOOD  SUGAR TWICE DAILY 200 strip 3  . famotidine (PEPCID) 20 MG tablet Take 1 tablet (20 mg total) by mouth 2 (two) times daily. 120 tablet 5  . hydrochlorothiazide (HYDRODIURIL) 12.5 MG tablet Take 1 tablet (12.5 mg total) by mouth daily. (Patient taking differently: Take 12.5 mg by mouth daily. 1/2 QD) 90 tablet 1  . insulin  lispro (HUMALOG KWIKPEN) 100 UNIT/ML KwikPen Inject 10 units into the skin 10 minutes prior to lunch & dinner. Hold if glucose is less than 150. (Patient taking differently: 10 Units. Inject 10 units into the skin 10 minutes prior to lunch & dinner. Hold if glucose is less than 150.) 15 mL 5  . Insulin Pen Needle (PEN NEEDLES) 32G X 6 MM MISC Used to give insulin injections 4 times daily. 100 each 10  . Lancets (ONETOUCH ULTRASOFT) lancets Used to check blood sugars twice daily. 100 each 12  . LANTUS SOLOSTAR 100 UNIT/ML Solostar Pen INJECT SUBCUTANEOUSLY 50  UNITS DAILY (Patient taking differently: Inject 50 Units into the skin.) 45 mL 3  . lidocaine-prilocaine (EMLA) cream Apply to affected area once 30 g 3  . [name] Take 5 mg by mouth at bedtime as needed.     . metFORMIN (GLUCOPHAGE XR) 500 MG 24 hr tablet Start 575m PO qpm for one week. Then the next week, take 10071mqhs. (Patient taking differently: Take 1,000 mg by mouth. Take two tablets (100068mqhs.) 90 tablet 3  . metoprolol succinate (TOPROL-XL) 100 MG 24 hr tablet TAKE 1 TABLET BY MOUTH  DAILY WITH OR IMMEDIATELY  FOLLOWING A MEAL 90 tablet 3  . ondansetron (ZOFRAN ODT) 4 MG disintegrating tablet Take 1 tablet (4 mg total) by mouth every 8 (eight) hours as needed for nausea or vomiting. 30 tablet 2  . ondansetron (ZOFRAN) 8 MG tablet Take 1 tablet (8 mg total) by mouth 2 (two) times daily as needed for refractory nausea / vomiting. Start on day 3 after chemotherapy. 30 tablet 1  . polyethylene glycol powder (GLYCOLAX/MIRALAX) 17 GM/SCOOP powder Take by mouth daily. 1/2 capful QD    . prochlorperazine (COMPAZINE) 10 MG tablet Take 1 tablet (10 mg total) by mouth every 6 (six) hours as needed (Nausea or vomiting). 30 tablet 1  . telmisartan (MICARDIS) 40 MG tablet Take 1 tablet (40 mg total) by mouth daily. PLEASE CALL OFFICE TO SCHEDULE FOLLOW UP APPOINTMENT FOR ANYMORE REFILLS. 90 tablet 0  . Ascorbic Acid (VITAMIN C) 1000 MG tablet  Take 1,000 mg by mouth daily. (Patient not taking: No sig reported)    . tamsulosin (FLOMAX) 0.4 MG CAPS capsule Take 1 capsule (0.4 mg total) by mouth daily. (Patient not taking: Reported on 06/28/2020) 30 capsule 1   Current Facility-Administered Medications  Medication Dose Route Frequency Provider Last Rate Last Admin  . 0.9 %  sodium chloride infusion  500 mL Intravenous Once Danis, HenKirke CorinD         PHYSICAL EXAMINATION: ECOG PERFORMANCE STATUS: 1 - Symptomatic but completely  ambulatory Vitals:   06/28/20 1032  BP: (!) 157/79  Pulse: 88  Resp: 18  Temp: 98.3 F (36.8 C)   Filed Weights   06/28/20 1032  Weight: 239 lb 8 oz (108.6 kg)    Physical Exam Constitutional:      General: He is not in acute distress. HENT:     Head: Normocephalic and atraumatic.  Eyes:     General: No scleral icterus. Cardiovascular:     Rate and Rhythm: Normal rate and regular rhythm.     Heart sounds: Normal heart sounds.  Pulmonary:     Effort: Pulmonary effort is normal. No respiratory distress.     Breath sounds: No wheezing.  Abdominal:     General: Bowel sounds are normal. There is no distension.     Palpations: Abdomen is soft.  Musculoskeletal:        General: No deformity. Normal range of motion.     Cervical back: Normal range of motion and neck supple.  Skin:    General: Skin is warm and dry.     Findings: No erythema or rash.  Neurological:     Mental Status: He is alert and oriented to person, place, and time. Mental status is at baseline.     Cranial Nerves: No cranial nerve deficit.     Coordination: Coordination normal.  Psychiatric:        Mood and Affect: Mood normal.     LABORATORY DATA:  I have reviewed the data as listed Lab Results  Component Value Date   WBC 8.9 06/28/2020   HGB 13.8 06/28/2020   HCT 41.8 06/28/2020   MCV 80.2 06/28/2020   PLT 238 06/28/2020   Recent Labs    04/10/20 1021 04/25/20 0942 05/07/20 0933 06/03/20 0815  06/10/20 1403 06/17/20 0819 06/21/20 0908 06/28/20 1019  NA  --   --  136   < > 134* 136 135 134*  K  --   --  4.4   < > 4.0 4.3 4.8 4.1  CL  --   --  98   < > 98 97* 95* 99  CO2  --   --  21   < > 23 34* 22 27  GLUCOSE  --   --  175*   < > 198* 208* 202* 365*  BUN 18  --  12   < > 17 15 11 15   CREATININE 0.91  --  0.86   < > 0.64 0.86 0.80 0.75  CALCIUM  --   --  9.2   < > 8.8* 9.2 9.2 9.1  GFRNONAA 98  --  101   < > >60 >60 104 >60  GFRAA 113  --  117  --   --   --  120  --   PROT  --  6.5  --    < > 7.8 7.4  --  7.4  ALBUMIN  --  3.1*  --    < > 3.0* 3.2*  --  3.6  AST  --  18  --    < > 28 24  --  18  ALT  --  17  --    < > 25 18  --  15  ALKPHOS  --  215*  --    < > 174* 147*  --  112  BILITOT  --  0.7  --    < > 0.7 0.5  --  0.5  BILIDIR  --  0.47*  --   --   --   --   --   --    < > = values in this interval not displayed.   Iron/TIBC/Ferritin/ %Sat No results found for: IRON, TIBC, FERRITIN, IRONPCTSAT    RADIOGRAPHIC STUDIES: I have personally reviewed the radiological images as listed and agreed with the findings in the report. PERIPHERAL VASCULAR CATHETERIZATION  Result Date: 05/30/2020 See op note     ASSESSMENT & PLAN:  1. Rectal cancer metastasized to liver ([place])   2. Encounter for antineoplastic chemotherapy   3. Neuroendocrine carcinoma of pancreas ([place])   4. Neuropathy due to chemotherapeutic drug ([place])   5. Uncontrolled other specified diabetes mellitus with hyperglycemia ([place])   Cancer Staging Rectal cancer metastasized to liver [facility]) Staging form: Colon and Rectum, AJCC 8th Edition - Clinical stage from 06/03/2020: Stage Unknown (cTX, cNX, pM1) - Signed by Earlie Server, MD on 06/03/2020   #Stage IV rectal cancer S/p 2 cycle of FOLFOX and bevacizumab.   Overall he tolerates well Labs reviewed and discussed with patient Patient will proceed with cycle 3 FOLFOX with bevacizumab on 07/01/2020. We discussed the plan of repeating imaging after 6 cycles  of treatment.    #Neuropathy secondary to chemotherapy, grade 1. Continue observation.  Recommend patient avoid cold temperature exposure.  # Pancreatic well differentiated neuroendocrine carcinoma, lesion is 1.9 x 1.5cm Normal chromogranin A.  Normal 5 HIAA level. He will need Dotadate PET in the future. Priority now is his metastatic rectal cancer.  We also discussed about observation of neuroendocrine carcinoma versus Sandostatin treatment if he becomes symptomatic.  He has been referred to establish care with genetic counselor.  #Uncontrolled diabetes,  continue follow-up with primary care provider for glycemic control. Today's glucose level is 365.  We discussed about importance of complying to strict diabetic diet All questions were answered. The patient knows to call the clinic with any problems questions or concerns.  cc Burnard Hawthorne, FNP    Return of visit: 2weeks Earlie Server, MD, PhD Hematology Oncology [facility] Pager- [phone] 06/28/2020

## 2020-06-28 NOTE — Progress Notes (Signed)
Pt here for follow up. Pt reports intermittent pain to rectal area and stiffening to hip area. Pt states he had covid booster yesterday (12/16)

## 2020-06-28 NOTE — Addendum Note (Signed)
Addended by: Earlie Server on: 06/28/2020 08:47 PM   Modules accepted: Orders

## 2020-06-29 LAB — CEA: CEA: 965 ng/mL — ABNORMAL HIGH (ref 0.0–4.7)

## 2020-07-01 ENCOUNTER — Inpatient Hospital Stay: Payer: Managed Care, Other (non HMO)

## 2020-07-01 ENCOUNTER — Encounter: Payer: Self-pay | Admitting: Licensed Clinical Social Worker

## 2020-07-01 ENCOUNTER — Other Ambulatory Visit: Payer: Self-pay

## 2020-07-01 ENCOUNTER — Inpatient Hospital Stay (HOSPITAL_BASED_OUTPATIENT_CLINIC_OR_DEPARTMENT_OTHER): Payer: Managed Care, Other (non HMO) | Admitting: Licensed Clinical Social Worker

## 2020-07-01 VITALS — BP 153/83 | HR 94 | Temp 97.1°F | Resp 20 | Wt 239.5 lb

## 2020-07-01 DIAGNOSIS — Z5112 Encounter for antineoplastic immunotherapy: Secondary | ICD-10-CM | POA: Diagnosis not present

## 2020-07-01 DIAGNOSIS — C787 Secondary malignant neoplasm of liver and intrahepatic bile duct: Secondary | ICD-10-CM

## 2020-07-01 DIAGNOSIS — C20 Malignant neoplasm of rectum: Secondary | ICD-10-CM

## 2020-07-01 DIAGNOSIS — C7A8 Other malignant neuroendocrine tumors: Secondary | ICD-10-CM | POA: Diagnosis not present

## 2020-07-01 DIAGNOSIS — Z8042 Family history of malignant neoplasm of prostate: Secondary | ICD-10-CM | POA: Diagnosis not present

## 2020-07-01 DIAGNOSIS — Z800 Family history of malignant neoplasm of digestive organs: Secondary | ICD-10-CM | POA: Diagnosis not present

## 2020-07-01 MED ORDER — PALONOSETRON HCL INJECTION 0.25 MG/5ML
0.2500 mg | Freq: Once | INTRAVENOUS | Status: AC
  Administered 2020-07-01: 0.25 mg via INTRAVENOUS
  Filled 2020-07-01: qty 5

## 2020-07-01 MED ORDER — SODIUM CHLORIDE 0.9 % IV SOLN
5.0000 mg/kg | Freq: Once | INTRAVENOUS | Status: AC
  Administered 2020-07-01: 500 mg via INTRAVENOUS
  Filled 2020-07-01: qty 16

## 2020-07-01 MED ORDER — SODIUM CHLORIDE 0.9 % IV SOLN
Freq: Once | INTRAVENOUS | Status: AC
  Filled 2020-07-01: qty 250

## 2020-07-01 MED ORDER — OXALIPLATIN CHEMO INJECTION 100 MG/20ML
86.0000 mg/m2 | Freq: Once | INTRAVENOUS | Status: AC
  Administered 2020-07-01: 200 mg via INTRAVENOUS
  Filled 2020-07-01: qty 40

## 2020-07-01 MED ORDER — SODIUM CHLORIDE 0.9 % IV SOLN
2400.0000 mg/m2 | INTRAVENOUS | Status: DC
  Administered 2020-07-01: 5550 mg via INTRAVENOUS
  Filled 2020-07-01: qty 111

## 2020-07-01 MED ORDER — DEXTROSE 5 % IV SOLN
Freq: Once | INTRAVENOUS | Status: AC
  Filled 2020-07-01: qty 250

## 2020-07-01 MED ORDER — SODIUM CHLORIDE 0.9% FLUSH
10.0000 mL | INTRAVENOUS | Status: DC | PRN
  Administered 2020-07-01: 10 mL
  Filled 2020-07-01: qty 10

## 2020-07-01 MED ORDER — LEUCOVORIN CALCIUM INJECTION 350 MG
390.0000 mg/m2 | Freq: Once | INTRAVENOUS | Status: AC
  Administered 2020-07-01: 900 mg via INTRAVENOUS
  Filled 2020-07-01: qty 45

## 2020-07-01 MED ORDER — FLUOROURACIL CHEMO INJECTION 2.5 GM/50ML
400.0000 mg/m2 | Freq: Once | INTRAVENOUS | Status: AC
  Administered 2020-07-01: 900 mg via INTRAVENOUS
  Filled 2020-07-01: qty 18

## 2020-07-01 MED ORDER — SODIUM CHLORIDE 0.9 % IV SOLN
10.0000 mg | Freq: Once | INTRAVENOUS | Status: AC
  Administered 2020-07-01: 10 mg via INTRAVENOUS
  Filled 2020-07-01: qty 10

## 2020-07-01 NOTE — Progress Notes (Signed)
Stable at discharge 

## 2020-07-01 NOTE — Progress Notes (Signed)
REFERRING PROVIDER: Earlie Server, MD [place],  [place] [address]  PRIMARY PROVIDER:  Burnard Hawthorne, FNP  PRIMARY REASON FOR VISIT:  1. Rectal cancer metastasized to liver ([place])   2. Family history of esophageal cancer   3. Family history of prostate cancer   4. 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 male, was seen for a [place] cancer genetics consultation at the request of Dr. Tasia Catchings due to a personal and family history of cancer.  Dominic [name] presents to clinic today to discuss the possibility of a hereditary predisposition to cancer, genetic testing, and to further clarify his future cancer risks, as well as potential cancer risks for family members.   In 2021, at the age of 19, Dominic [name] was diagnosed with rectal cancer and neuroendocrine carcinoma of the pancreas. He is currently being treated with chemotherapy. His liver biopsy showed normal MMR testing.    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 ([place])   06/03/2020 -  Chemotherapy   The patient had palonosetron (ALOXI) injection 0.25 mg, 0.25 mg, Intravenous,  Once, 3 of 8 cycles Administration: 0.25 mg (06/03/2020), 0.25 mg (06/17/2020) leucovorin 924 mg in dextrose 5 % 250 mL infusion, 400 mg/m2 = 924 mg, Intravenous,  Once, 3 of 8 cycles oxaliplatin (ELOXATIN) 200 mg in dextrose 5 % 500 mL chemo infusion, 195 mg, Intravenous,  Once, 3 of 8 cycles Administration: 200 mg (06/03/2020), 200 mg (06/17/2020) fluorouracil (ADRUCIL) chemo injection 900 mg, 400 mg/m2 = 900 mg, Intravenous,  Once, 3 of 8 cycles Administration: 900 mg (06/03/2020), 900 mg (06/17/2020) fluorouracil (ADRUCIL) 5,550 mg in sodium chloride 0.9 % 139 mL chemo infusion, 2,400 mg/m2 = 5,550 mg, Intravenous, 1 Day/Dose, 3 of 8 cycles Administration: 5,550 mg (06/03/2020), 5,550 mg  (06/17/2020) bevacizumab-bvzr (ZIRABEV) 500 mg in sodium chloride 0.9 % 100 mL chemo infusion, 5 mg/kg = 500 mg, Intravenous,  Once, 3 of 8 cycles Administration: 500 mg (06/03/2020), 500 mg (06/17/2020)  for chemotherapy treatment.    06/03/2020 Cancer Staging   Staging form: Colon and Rectum, AJCC 8th Edition - Clinical stage from 06/03/2020: Stage Unknown (cTX, cNX, pM1) - Signed by Earlie Server, MD on 06/03/2020      Past Medical History:  Diagnosis Date  . Anxiety   . Cancer [facility])    Rectal cancer metastasized to liver ([place])  . Diabetes mellitus without complication ([place])   . Dysrhythmia    svt  . Family history of esophageal cancer   . Family history of prostate cancer   . GERD (gastroesophageal reflux disease)   . High triglycerides   . Hypertension   . Long-term insulin use in type 2 diabetes ([place])   . Morbid obesity ([place])   . Psoriatic arthritis ([place])   . SVT (supraventricular tachycardia) ([facility])     Past Surgical History:  Procedure Laterality Date  . EUS N/A 05/23/2020   Procedure: FULL UPPER ENDOSCOPIC ULTRASOUND (EUS) RADIAL;  Surgeon: Jola Schmidt, MD;  Location: [facility] ENDOSCOPY;  Service: Endoscopy;  Laterality: N/A;  . PORTA CATH INSERTION N/A 05/30/2020   Procedure: PORTA CATH INSERTION;  Surgeon: Algernon Huxley, MD;  Location: [facility] CV LAB;  Service: Cardiovascular;  Laterality: N/A;  . UPPER GASTROINTESTINAL ENDOSCOPY      Social History   Socioeconomic History  .  Marital status: Soil scientist    Spouse name: Marc Morgans  . Number of children: Not on file  . Years of education: Not on file  . Highest education level: Not on file  Occupational History  . Not on file  Tobacco Use  . Smoking status: Never Smoker  . Smokeless tobacco: Never Used  Vaping Use  . Vaping Use: Never used  Substance and Sexual Activity  . Alcohol use: Yes    Alcohol/week: 1.0 standard drink    Types: 1 Glasses of wine per week    Comment: very occasional  . Drug use:  Never  . Sexual activity: Not on file  Other Topics Concern  . Not on file  Social History Narrative   Lives in [place] partner [place].  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     Social Determinants of Health   Financial Resource Strain: Medium Risk  . Difficulty of Paying Living Expenses: Somewhat hard  Food Insecurity: Not on file  Transportation Needs: Not on file  Physical Activity: Not on file  Stress: Not on file  Social Connections: Not on file     FAMILY HISTORY:  We obtained a detailed, 4-generation family history.  Significant diagnoses are listed below: Family History  Problem Relation Age of Onset  . Arthritis Mother   . Heart disease Mother   . Supraventricular tachycardia Mother        s/p ablation  . Hypertension Mother   . Diabetes Father   . Heart attack Maternal Uncle   . Throat cancer Maternal Uncle   . Esophageal cancer Maternal Uncle   . Heart attack Maternal Uncle   . Heart attack Maternal Uncle   . Prostate cancer Maternal Uncle   . Thyroid cancer Neg Hx   . Colon cancer Neg Hx   . Rectal cancer Neg Hx   . Stomach cancer Neg Hx   . Colon polyps Neg Hx    Dominic [name] does not have children. He has one paternal half brother. He is unaware of any cancer on his paternal side of the family, and has limited information/contact on that side.   Dominic [name] mother is living at 65. Patient has 6 maternal uncles, 1 maternal aunt. One uncle had throat/esophageal cancer and exposure to asbestos. Another uncle had prostate cancer. No known cancers in maternal cousins or grandparents.  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 Patient's maternal ancestors are of Polish/German descent, and paternal ancestors are of unknown descent. There is no reported Ashkenazi Jewish ancestry. There is no known consanguinity.    GENETIC COUNSELING ASSESSMENT: Dominic [name] is a 49 y.o. male with a personal history of rectal cancer at 79  which is somewhat suggestive of a hereditary cancer syndrome and predisposition to cancer. We, therefore, discussed and recommended the following at today's visit.   DISCUSSION: We discussed that approximately 5-10% of cancer is hereditary. Most cases of hereditary colorectal cancer are associated with Lynch syndrome genes, although there are other genes associated with hereditary colorectal cancer as well. We discussed that testing is beneficial for several reasons including knowing about other cancer risks, identifying potential screening and risk-reduction options that may be appropriate, and to understand if other family members could be at risk for cancer and allow them to undergo genetic testing.   We reviewed the characteristics, features and inheritance patterns of hereditary cancer syndromes. We also discussed genetic testing, including the appropriate family members to test, the  process of testing, insurance coverage and turn-around-time for results. We discussed the implications of a negative, positive and/or variant of uncertain significant result. We recommended Dominic [name] pursue genetic testing for the [facility] CustomNext-RNA gene panel.   The CustomNext-Cancer + RNAinsight panel  includes sequencing and/or deletion duplication testing of the following 91 genes: AIP, ALK, APC*, ATM*, AXIN2, BAP1, BARD1, BLM, BMPR1A, BRCA1*, BRCA2*, BRIP1*, CDC73, CDH1*, CDK4, CDKN1B, CDKN2A, CHEK2*, CTNNA1, DICER1, FANCC, FH, FLCN, GALNT12, KIF1B, LZTR1, MAX, MEN1, MET, MLH1*, MRE11A, MSH2*, MSH3, MSH6*, MUTYH*, NBN, NF1*, NF2, NTHL1, PALB2*, PHOX2B, PMS2*, POT1, PRKAR1A, PTCH1, PTEN*, RAD50, RAD51C*, RAD51D*, RB1, RECQL, RET, SDHA, SDHAF2, SDHB, SDHC, SDHD, SMAD4, SMARCA4, SMARCB1, SMARCE1, STK11, SUFU, TMEM127, TP53*, TSC1, TSC2, VHL and XRCC2 (sequencing and deletion/duplication); CASR, CFTR, CPA1, CTRC, EGFR, EGLN1, FAM175A, HOXB13, KIT, MITF, MLH3, PALLD, PDGFRA, POLD1, POLE, PRSS1, RINT1, RPS20, SPINK1 and  TERT (sequencing only); EPCAM and GREM1 (deletion/duplication only).   Based on Dominic [name] personal history of cancer, he meets medical criteria for genetic testing. Despite that he meets criteria, he may still have an out of pocket cost. We discussed that if his out of pocket cost for testing is over $100, the laboratory will call and confirm whether he wants to proceed with testing.  If the out of pocket cost of testing is less than $100 he will be billed by the genetic testing laboratory.   PLAN: After considering the risks, benefits, and limitations, Dominic [name] provided informed consent to pursue genetic testing and the blood sample was sent to [name] for analysis of the CustomNext+RNA panel. Results should be available within approximately 2-3 weeks' time, at which point they will be disclosed by telephone to Dominic [name], as will any additional recommendations warranted by these results. Dominic [name] will receive a summary of his genetic counseling visit and a copy of his results once available. This information will also be available in Epic.   Dominic [name] questions were answered to his satisfaction today. Our contact information was provided should additional questions or concerns arise. Thank you for the referral and allowing [place] to share in the care of your patient.   Faith Rogue, MS, [name] Genetic Counselor [email or web address] Phone: [phone]  The patient was seen for a total of 25 minutes in face-to-face genetic counseling.  Dr. [place] Ormond was available for discussion regarding this case.   _______________________________________________________________________ For Office Staff:  Number of people involved in session: 1 Was an Intern/ student involved with case: no

## 2020-07-03 ENCOUNTER — Inpatient Hospital Stay: Payer: Managed Care, Other (non HMO)

## 2020-07-03 DIAGNOSIS — C20 Malignant neoplasm of rectum: Secondary | ICD-10-CM

## 2020-07-03 DIAGNOSIS — Z5112 Encounter for antineoplastic immunotherapy: Secondary | ICD-10-CM | POA: Diagnosis not present

## 2020-07-03 MED ORDER — HEPARIN SOD (PORK) LOCK FLUSH 100 UNIT/ML IV SOLN
500.0000 [IU] | Freq: Once | INTRAVENOUS | Status: AC | PRN
  Administered 2020-07-03: 500 [IU]
  Filled 2020-07-03: qty 5

## 2020-07-03 MED ORDER — SODIUM CHLORIDE 0.9% FLUSH
10.0000 mL | INTRAVENOUS | Status: DC | PRN
  Administered 2020-07-03: 10 mL
  Filled 2020-07-03: qty 10

## 2020-07-10 ENCOUNTER — Telehealth: Payer: Managed Care, Other (non HMO)

## 2020-07-10 ENCOUNTER — Telehealth: Payer: Self-pay | Admitting: Pharmacist

## 2020-07-10 NOTE — Telephone Encounter (Signed)
  Chronic Care Management   Note  07/10/2020 Name: Dominic [name] MRN: [id number] DOB: October 14, 1970   Attempted to contact patient for scheduled appointment for medication management support. Left HIPAA compliant message for patient to return my call at their convenience.    As previsit review, accessed patient's glucose readings on [facility]. Will communicate w/ PCP as I strongly recommend increase in basal insulin.     SIGNATURE

## 2020-07-11 ENCOUNTER — Other Ambulatory Visit: Payer: Self-pay

## 2020-07-11 NOTE — Telephone Encounter (Signed)
Please call pt and sch follow up to discuss blood sugar and adjustment of nasal insulin

## 2020-07-11 NOTE — Telephone Encounter (Signed)
Patient 07/17/19 to come in office.

## 2020-07-15 ENCOUNTER — Inpatient Hospital Stay (HOSPITAL_BASED_OUTPATIENT_CLINIC_OR_DEPARTMENT_OTHER): Payer: Managed Care, Other (non HMO) | Admitting: Oncology

## 2020-07-15 ENCOUNTER — Encounter: Payer: Self-pay | Admitting: Oncology

## 2020-07-15 ENCOUNTER — Inpatient Hospital Stay: Payer: Managed Care, Other (non HMO)

## 2020-07-15 ENCOUNTER — Inpatient Hospital Stay: Payer: Managed Care, Other (non HMO) | Attending: Oncology

## 2020-07-15 VITALS — BP 154/83 | HR 83 | Temp 97.9°F | Resp 16 | Ht 69.0 in | Wt 241.0 lb

## 2020-07-15 DIAGNOSIS — C7951 Secondary malignant neoplasm of bone: Secondary | ICD-10-CM

## 2020-07-15 DIAGNOSIS — C20 Malignant neoplasm of rectum: Secondary | ICD-10-CM

## 2020-07-15 DIAGNOSIS — C787 Secondary malignant neoplasm of liver and intrahepatic bile duct: Secondary | ICD-10-CM

## 2020-07-15 DIAGNOSIS — Z801 Family history of malignant neoplasm of trachea, bronchus and lung: Secondary | ICD-10-CM | POA: Diagnosis not present

## 2020-07-15 DIAGNOSIS — E119 Type 2 diabetes mellitus without complications: Secondary | ICD-10-CM | POA: Diagnosis not present

## 2020-07-15 DIAGNOSIS — Z5111 Encounter for antineoplastic chemotherapy: Secondary | ICD-10-CM

## 2020-07-15 DIAGNOSIS — I10 Essential (primary) hypertension: Secondary | ICD-10-CM | POA: Diagnosis not present

## 2020-07-15 DIAGNOSIS — I471 Supraventricular tachycardia: Secondary | ICD-10-CM | POA: Insufficient documentation

## 2020-07-15 DIAGNOSIS — Z833 Family history of diabetes mellitus: Secondary | ICD-10-CM | POA: Diagnosis not present

## 2020-07-15 DIAGNOSIS — G620 Drug-induced polyneuropathy: Secondary | ICD-10-CM | POA: Insufficient documentation

## 2020-07-15 DIAGNOSIS — Z79899 Other long term (current) drug therapy: Secondary | ICD-10-CM | POA: Diagnosis not present

## 2020-07-15 DIAGNOSIS — C7A098 Malignant carcinoid tumors of other sites: Secondary | ICD-10-CM | POA: Diagnosis not present

## 2020-07-15 DIAGNOSIS — Z8042 Family history of malignant neoplasm of prostate: Secondary | ICD-10-CM | POA: Insufficient documentation

## 2020-07-15 DIAGNOSIS — T451X5A Adverse effect of antineoplastic and immunosuppressive drugs, initial encounter: Secondary | ICD-10-CM | POA: Diagnosis not present

## 2020-07-15 DIAGNOSIS — Z5112 Encounter for antineoplastic immunotherapy: Secondary | ICD-10-CM | POA: Diagnosis present

## 2020-07-15 DIAGNOSIS — Z794 Long term (current) use of insulin: Secondary | ICD-10-CM | POA: Insufficient documentation

## 2020-07-15 DIAGNOSIS — Z7189 Other specified counseling: Secondary | ICD-10-CM

## 2020-07-15 DIAGNOSIS — E1365 Other specified diabetes mellitus with hyperglycemia: Secondary | ICD-10-CM

## 2020-07-15 DIAGNOSIS — Z8249 Family history of ischemic heart disease and other diseases of the circulatory system: Secondary | ICD-10-CM | POA: Insufficient documentation

## 2020-07-15 DIAGNOSIS — C7A8 Other malignant neuroendocrine tumors: Secondary | ICD-10-CM

## 2020-07-15 LAB — CBC WITH DIFFERENTIAL/PLATELET
Abs Immature Granulocytes: 0.03 10*3/uL (ref 0.00–0.07)
Basophils Absolute: 0 10*3/uL (ref 0.0–0.1)
Basophils Relative: 1 %
Eosinophils Absolute: 0.2 10*3/uL (ref 0.0–0.5)
Eosinophils Relative: 3 %
HCT: 38.4 % — ABNORMAL LOW (ref 39.0–52.0)
Hemoglobin: 13.4 g/dL (ref 13.0–17.0)
Immature Granulocytes: 0 %
Lymphocytes Relative: 25 %
Lymphs Abs: 2 10*3/uL (ref 0.7–4.0)
MCH: 27.5 pg (ref 26.0–34.0)
MCHC: 34.9 g/dL (ref 30.0–36.0)
MCV: 78.7 fL — ABNORMAL LOW (ref 80.0–100.0)
Monocytes Absolute: 0.7 10*3/uL (ref 0.1–1.0)
Monocytes Relative: 9 %
Neutro Abs: 5 10*3/uL (ref 1.7–7.7)
Neutrophils Relative %: 62 %
Platelets: 184 10*3/uL (ref 150–400)
RBC: 4.88 MIL/uL (ref 4.22–5.81)
RDW: 16.5 % — ABNORMAL HIGH (ref 11.5–15.5)
WBC: 7.9 10*3/uL (ref 4.0–10.5)
nRBC: 0 % (ref 0.0–0.2)

## 2020-07-15 LAB — COMPREHENSIVE METABOLIC PANEL
ALT: 16 U/L (ref 0–44)
AST: 21 U/L (ref 15–41)
Albumin: 3.5 g/dL (ref 3.5–5.0)
Alkaline Phosphatase: 88 U/L (ref 38–126)
Anion gap: 7 (ref 5–15)
BUN: 11 mg/dL (ref 6–20)
CO2: 26 mmol/L (ref 22–32)
Calcium: 8.8 mg/dL — ABNORMAL LOW (ref 8.9–10.3)
Chloride: 100 mmol/L (ref 98–111)
Creatinine, Ser: 0.68 mg/dL (ref 0.61–1.24)
GFR, Estimated: >60 mL/min (ref >60)
Glucose, Bld: 194 mg/dL — ABNORMAL HIGH (ref 70–99)
Potassium: 3.6 mmol/L (ref 3.5–5.1)
Sodium: 133 mmol/L — ABNORMAL LOW (ref 135–145)
Total Bilirubin: 0.6 mg/dL (ref 0.3–1.2)
Total Protein: 6.9 g/dL (ref 6.5–8.1)

## 2020-07-15 LAB — PROTEIN, URINE, RANDOM: Total Protein, Urine: 13 mg/dL

## 2020-07-15 MED ORDER — FLUOROURACIL CHEMO INJECTION 2.5 GM/50ML
400.0000 mg/m2 | Freq: Once | INTRAVENOUS | Status: AC
  Administered 2020-07-15: 900 mg via INTRAVENOUS
  Filled 2020-07-15: qty 18

## 2020-07-15 MED ORDER — SODIUM CHLORIDE 0.9 % IV SOLN
5.0000 mg/kg | Freq: Once | INTRAVENOUS | Status: AC
  Administered 2020-07-15: 500 mg via INTRAVENOUS
  Filled 2020-07-15: qty 16

## 2020-07-15 MED ORDER — SODIUM CHLORIDE 0.9 % IV SOLN
10.0000 mg | Freq: Once | INTRAVENOUS | Status: AC
  Administered 2020-07-15: 10 mg via INTRAVENOUS
  Filled 2020-07-15: qty 10

## 2020-07-15 MED ORDER — PALONOSETRON HCL INJECTION 0.25 MG/5ML
0.2500 mg | Freq: Once | INTRAVENOUS | Status: AC
  Administered 2020-07-15: 0.25 mg via INTRAVENOUS
  Filled 2020-07-15: qty 5

## 2020-07-15 MED ORDER — DEXTROSE 5 % IV SOLN
Freq: Once | INTRAVENOUS | Status: AC
  Filled 2020-07-15: qty 250

## 2020-07-15 MED ORDER — SODIUM CHLORIDE 0.9 % IV SOLN
Freq: Once | INTRAVENOUS | Status: AC
  Filled 2020-07-15: qty 250

## 2020-07-15 MED ORDER — SODIUM CHLORIDE 0.9 % IV SOLN
2400.0000 mg/m2 | INTRAVENOUS | Status: DC
  Administered 2020-07-15: 5550 mg via INTRAVENOUS
  Filled 2020-07-15: qty 111

## 2020-07-15 MED ORDER — OXALIPLATIN CHEMO INJECTION 100 MG/20ML
86.0000 mg/m2 | Freq: Once | INTRAVENOUS | Status: AC
  Administered 2020-07-15: 200 mg via INTRAVENOUS
  Filled 2020-07-15: qty 40

## 2020-07-15 MED ORDER — LEUCOVORIN CALCIUM INJECTION 350 MG
389.6000 mg/m2 | Freq: Once | INTRAVENOUS | Status: AC
  Administered 2020-07-15: 900 mg via INTRAVENOUS
  Filled 2020-07-15: qty 45

## 2020-07-15 NOTE — Progress Notes (Signed)
Pt tolerated all infusions well today with no complaints.  Pt left infusion suite stable and ambulatory with his 5FU pump infusing as ordered.

## 2020-07-15 NOTE — Progress Notes (Signed)
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  male with PMH listed below was seen in consultation at the request of  Burnard Hawthorne, FNP  for evaluation of liver and pancreatic mass  #reports symptoms of upper abdomen band like discomfort and bloating for a few months, improved symptoms with omeprazole,.Chronic constipation, he has blood in the stool occationally Seen by PCP in September 2021. AlK phosphatase was recently noted to be elevated as well well elevated GGT. He was referred to see gastroenteralgias on 04/10/2020.  04/25/2020 EGD showed gastric fundus mild inflammation. Biopsy showed reactive gastropathy.  04/30/2020 [place] RUQ showed cirrhosis with multiple hepatic masses.  05/01/2020 AFP 1.7 05/02/2020 MR liver w wo contrast showed numerous heterozygous enhancing liver lesions, throughout the liver, not typical for [facility], likely metastatic disease.  Solid appearing enhancing lesion in pancreatic tail 1.9x1.5cm, suspicious for primary neoplasm.  Mild adenopathy within porta hepatic region and porta hepatic region. Splenomegaly.   Patient has history of SVT, psoriatic arthritis, morbid obesity, GERD, DM.  Family history + maternal uncle with esophageal cancer.   + unintentional weight loss, no fever, chills. "sweats a lot". Currently on antibiotics for proctitis.  He works at [employer], lives in [place]. He lives with his partner.  During today's visit, he felt  dizziness and lightheaded when sitting on the examination table.  He lost consciousness briefly for about 2-3 minutes, regain consciousness spontaneously, perfusing sweating. SBP in 90s, and HR initially in 30-40s, later improved to 70s.  He denies any chest pain, SOB. Blood sugar was 207.   # 05/17/2020, colonoscopy showed a fungating and infiltrative partially obstructing large mass in the proximal rectum and in the mid rectum.  The mass was partially circumferential, involving two thirds of the lumen circumference is.  Biopsy showed adenocarcinoma.  05/23/2020, patient underwent EUS biopsy of the pancreatic tail lesion.  Biopsy pathology showed well-differentiated neuroendocrine tumor.  Ki-67 is noncontributory due to insufficient tissue in the cell block.  INTERVAL HISTORY Dominic [name] is a 50 y.o. 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 Problems and complaints are listed below: Status post cycle 3 FOLFOX with bevacizumab.  Overall he tolerates well He reports 1 day of diarrhea yesterday and his roommate has the same symptoms.  Diarrhea has completely resolved today No nausea vomiting He also has developed some anterior chest wall rash which completely resolved .  No fever or chills.   Review of Systems  Constitutional: Positive for fatigue. Negative for appetite change, chills, fever and unexpected weight change.  HENT:   Negative for hearing loss and voice change.   Eyes: Negative for eye problems and icterus.  Respiratory: Negative for chest tightness, cough and shortness of breath.   Cardiovascular: Negative for chest pain and leg swelling.  Gastrointestinal: Negative for abdominal distention and abdominal pain.  Endocrine: Negative for hot flashes.  Genitourinary: Negative for difficulty urinating, dysuria and frequency.   Musculoskeletal: Negative for  arthralgias.  Skin: Positive for rash. Negative for itching.  Neurological: Positive for numbness. Negative for light-headedness.  Hematological: Negative for adenopathy. Does not bruise/bleed easily.  Psychiatric/Behavioral: Negative for confusion.    MEDICAL HISTORY:  Past Medical History:  Diagnosis Date  . Anxiety   . Cancer [facility])    Rectal cancer metastasized to liver ([place])  . Diabetes mellitus without complication ([place])   . Dysrhythmia    svt  . Family history of esophageal cancer   . Family history of prostate cancer   . GERD (gastroesophageal reflux disease)   . High triglycerides   . Hypertension   . Long-term insulin use in type 2 diabetes ([place])   . Morbid obesity ([place])   . Psoriatic arthritis ([place])   . SVT (supraventricular tachycardia) ([place])     SURGICAL HISTORY: Past Surgical History:  Procedure Laterality Date  . EUS N/A 05/23/2020   Procedure: FULL UPPER ENDOSCOPIC ULTRASOUND (EUS) RADIAL;  Surgeon: Jola Schmidt, MD;  Location: [facility] ENDOSCOPY;  Service: Endoscopy;  Laterality: N/A;  . PORTA CATH INSERTION N/A 05/30/2020   Procedure: PORTA CATH INSERTION;  Surgeon: Algernon Huxley, MD;  Location: [place] [facility];  Service: Cardiovascular;  Laterality: N/A;  . UPPER GASTROINTESTINAL ENDOSCOPY      SOCIAL HISTORY: Social History   Socioeconomic History  . Marital status: Soil scientist    Spouse name: Marc Morgans  . Number of children: Not on file  . Years of education: Not on file  . Highest education level: Not on file  Occupational History  . Not on file  Tobacco Use  . Smoking status: Never Smoker  . Smokeless tobacco: Never Used  Vaping Use  . Vaping Use: Never used  Substance and Sexual Activity  . Alcohol use: Yes    Alcohol/week: 1.0 standard drink    Types: 1 Glasses of wine per week    Comment: very occasional  . Drug use: Never  . Sexual activity: Not on file  Other Topics Concern  . Not on file  Social History Narrative   Lives in  [place] partner [place].  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     Social Determinants of Health   Financial Resource Strain: Medium Risk  . Difficulty of Paying Living Expenses: Somewhat hard  Food Insecurity: Not on file  Transportation Needs: Not on file  Physical Activity: Not on file  Stress: Not on file  Social Connections: Not on file  Intimate Partner Violence: Not on file    FAMILY HISTORY: Family History  Problem Relation Age of Onset  . Arthritis Mother   . Heart disease Mother   . Supraventricular tachycardia Mother        s/p ablation  . Hypertension Mother   . Diabetes Father   . Heart attack Maternal Uncle   . Throat cancer Maternal Uncle   . Esophageal cancer Maternal Uncle   . Heart attack Maternal Uncle   . Heart attack Maternal Uncle   . Prostate cancer Maternal Uncle   . Thyroid cancer Neg Hx   . Colon cancer Neg Hx   . Rectal cancer Neg Hx   . Stomach cancer Neg Hx   . Colon polyps Neg Hx     ALLERGIES:  has No Known Allergies.  MEDICATIONS:  Current Outpatient Medications  Medication Sig Dispense Refill  . ALPRAZolam (XANAX) 0.5 MG tablet Take half tablet to one tablet by mouth as needed daily for insomnia, anxiety. 30 tablet 0  . amLODipine (NORVASC) 10 MG  tablet TAKE 1 TABLET BY MOUTH  DAILY 90 tablet 3  . Continuous Blood Gluc Sensor (FREESTYLE LIBRE 2 SENSOR) MISC Scan glucose at least QID 2 each 11  . CONTOUR NEXT TEST test strip USE 1 STRIP TO TEST BLOOD  SUGAR TWICE DAILY 200 strip 3  . famotidine (PEPCID) 20 MG tablet Take 1 tablet (20 mg total) by mouth 2 (two) times daily. 120 tablet 5  . hydrochlorothiazide (HYDRODIURIL) 12.5 MG tablet Take 1 tablet (12.5 mg total) by mouth daily. (Patient taking differently: Take 12.5 mg by mouth daily. 1/2 QD) 90 tablet 1  . insulin lispro (HUMALOG KWIKPEN) 100 UNIT/ML KwikPen Inject 10 units into the skin 10 minutes prior to lunch & dinner. Hold if glucose is less than 150. (Patient taking differently: 10 Units.  Inject 10 units into the skin 10 minutes prior to lunch & dinner. Hold if glucose is less than 150.) 15 mL 5  . Insulin Pen Needle (PEN NEEDLES) 32G X 6 MM MISC Used to give insulin injections 4 times daily. 100 each 10  . Lancets (ONETOUCH ULTRASOFT) lancets Used to check blood sugars twice daily. 100 each 12  . LANTUS SOLOSTAR 100 UNIT/ML Solostar Pen INJECT SUBCUTANEOUSLY 50  UNITS DAILY (Patient taking differently: Inject 50 Units into the skin.) 45 mL 3  . lidocaine-prilocaine (EMLA) cream Apply to affected area once 30 g 3  . [name] Take 5 mg by mouth at bedtime as needed.     . metFORMIN (GLUCOPHAGE XR) 500 MG 24 hr tablet Start 511m PO qpm for one week. Then the next week, take 10012mqhs. (Patient taking differently: Take 1,000 mg by mouth. Take two tablets (100013mqhs.) 90 tablet 3  . metoprolol succinate (TOPROL-XL) 100 MG 24 hr tablet TAKE 1 TABLET BY MOUTH  DAILY WITH OR IMMEDIATELY  FOLLOWING A MEAL 90 tablet 3  . ondansetron (ZOFRAN ODT) 4 MG disintegrating tablet Take 1 tablet (4 mg total) by mouth every 8 (eight) hours as needed for nausea or vomiting. 30 tablet 2  . ondansetron (ZOFRAN) 8 MG tablet Take 1 tablet (8 mg total) by mouth 2 (two) times daily as needed for refractory nausea / vomiting. Start on day 3 after chemotherapy. 30 tablet 1  . polyethylene glycol powder (GLYCOLAX/MIRALAX) 17 GM/SCOOP powder Take by mouth daily. 1/2 capful QD    . prochlorperazine (COMPAZINE) 10 MG tablet Take 1 tablet (10 mg total) by mouth every 6 (six) hours as needed (Nausea or vomiting). 30 tablet 1  . telmisartan (MICARDIS) 40 MG tablet Take 1 tablet (40 mg total) by mouth daily. PLEASE CALL OFFICE TO SCHEDULE FOLLOW UP APPOINTMENT FOR ANYMORE REFILLS. 90 tablet 0  . tamsulosin (FLOMAX) 0.4 MG CAPS capsule Take 1 capsule (0.4 mg total) by mouth daily. (Patient not taking: Reported on 07/15/2020) 30 capsule 1   Current Facility-Administered Medications  Medication Dose Route Frequency  Provider Last Rate Last Admin  . 0.9 %  sodium chloride infusion  500 mL Intravenous Once DanNelida MeuseI, MD         PHYSICAL EXAMINATION: ECOG PERFORMANCE STATUS: 1 - Symptomatic but completely ambulatory Vitals:   07/15/20 0837  BP: (!) 154/83  Pulse: 83  Resp: 16  Temp: 97.9 F (36.6 C)  SpO2: 99%   Filed Weights   07/15/20 0837  Weight: 241 lb (109.3 kg)    Physical Exam Constitutional:      General: He is not in acute distress. HENT:  Head: Normocephalic and atraumatic.  Eyes:     General: No scleral icterus. Cardiovascular:     Rate and Rhythm: Normal rate and regular rhythm.     Heart sounds: Normal heart sounds.  Pulmonary:     Effort: Pulmonary effort is normal. No respiratory distress.     Breath sounds: No wheezing.  Abdominal:     General: Bowel sounds are normal. There is no distension.     Palpations: Abdomen is soft.  Musculoskeletal:        General: No deformity. Normal range of motion.     Cervical back: Normal range of motion and neck supple.  Skin:    General: Skin is warm and dry.     Findings: No erythema or rash.  Neurological:     Mental Status: He is alert and oriented to person, place, and time. Mental status is at baseline.     Cranial Nerves: No cranial nerve deficit.     Coordination: Coordination normal.  Psychiatric:        Mood and Affect: Mood normal.     LABORATORY DATA:  I have reviewed the data as listed Lab Results  Component Value Date   WBC 7.9 07/15/2020   HGB 13.4 07/15/2020   HCT 38.4 (L) 07/15/2020   MCV 78.7 (L) 07/15/2020   PLT 184 07/15/2020   Recent Labs    04/10/20 1021 04/25/20 0942 05/07/20 0933 06/03/20 0815 06/10/20 1403 06/17/20 0819 06/21/20 0908 06/28/20 1019  NA  --   --  136   < > 134* 136 135 134*  K  --   --  4.4   < > 4.0 4.3 4.8 4.1  CL  --   --  98   < > 98 97* 95* 99  CO2  --   --  21   < > 23 34* 22 27  GLUCOSE  --   --  175*   < > 198* 208* 202* 365*  BUN 18  --  12   <  > 17 15 11 15   CREATININE 0.91  --  0.86   < > 0.64 0.86 0.80 0.75  CALCIUM  --   --  9.2   < > 8.8* 9.2 9.2 9.1  GFRNONAA 98  --  101   < > >60 >60 104 >60  GFRAA 113  --  117  --   --   --  120  --   PROT  --  6.5  --    < > 7.8 7.4  --  7.4  ALBUMIN  --  3.1*  --    < > 3.0* 3.2*  --  3.6  AST  --  18  --    < > 28 24  --  18  ALT  --  17  --    < > 25 18  --  15  ALKPHOS  --  215*  --    < > 174* 147*  --  112  BILITOT  --  0.7  --    < > 0.7 0.5  --  0.5  BILIDIR  --  0.47*  --   --   --   --   --   --    < > = values in this interval not displayed.   Iron/TIBC/Ferritin/ %Sat No results found for: IRON, TIBC, FERRITIN, IRONPCTSAT    RADIOGRAPHIC STUDIES: I have personally reviewed the radiological images as listed and agreed with  the findings in the report. No results found.    ASSESSMENT & PLAN:  1. Rectal cancer metastasized to liver ([place])   2. Encounter for antineoplastic chemotherapy   3. Neuroendocrine carcinoma of pancreas ([place])   4. Neuropathy due to chemotherapeutic drug ([place])   5. Uncontrolled other specified diabetes mellitus with hyperglycemia ([place])   Cancer Staging Rectal cancer metastasized to liver [facility]) Staging form: Colon and Rectum, AJCC 8th Edition - Clinical stage from 06/03/2020: Stage Unknown (cTX, cNX, pM1) - Signed by Earlie Server, MD on 06/03/2020   #Stage IV rectal cancer S/p 3 cycle of FOLFOX and bevacizumab.   Overall he tolerates well Labs are reviewed and discussed with patient His cancer acceptable to proceed with cycle 4 FOLFOX with bevacizumab. Plan to repeat imaging after 6 cycles of treatment or earlier if tumor marker progressively worsened.  #Rash, questionable chemotherapy-induced rash versus allergic reaction to tape.  Rash has completely resolved.  Continue monitor symptoms #Chemotherapy-induced neuropathy, grade 1.  Continue observation.  Symptoms are stable #Pancreatic well differentiated neuroendocrine carcinoma, lesion is 1.9 x  1.5cm Normal chromogranin A.  Normal 5 HIAA level. He will need Dotadate PET in the future. Priority now is his metastatic rectal cancer.  We also discussed about observation of neuroendocrine carcinoma versus Sandostatin treatment if he becomes symptomatic.  He has been referred to establish care with genetic counselor.  #Uncontrolled diabetes,  continue follow-up with primary care provider for glycemic control. Today's glucose level is 194..  all questions were answered. The patient knows to call the clinic with any problems questions or concerns.  cc Burnard Hawthorne, FNP    Return of visit: 2weeks Earlie Server, MD, PhD Hematology Oncology [facility] at [facility] Pager- [phone] 07/15/2020

## 2020-07-16 ENCOUNTER — Ambulatory Visit: Payer: Managed Care, Other (non HMO) | Admitting: Family

## 2020-07-16 ENCOUNTER — Encounter: Payer: Self-pay | Admitting: Family

## 2020-07-16 ENCOUNTER — Telehealth (INDEPENDENT_AMBULATORY_CARE_PROVIDER_SITE_OTHER): Payer: Managed Care, Other (non HMO) | Admitting: Family

## 2020-07-16 DIAGNOSIS — I10 Essential (primary) hypertension: Secondary | ICD-10-CM

## 2020-07-16 DIAGNOSIS — Z794 Long term (current) use of insulin: Secondary | ICD-10-CM

## 2020-07-16 DIAGNOSIS — G4700 Insomnia, unspecified: Secondary | ICD-10-CM | POA: Diagnosis not present

## 2020-07-16 DIAGNOSIS — G620 Drug-induced polyneuropathy: Secondary | ICD-10-CM

## 2020-07-16 DIAGNOSIS — E119 Type 2 diabetes mellitus without complications: Secondary | ICD-10-CM | POA: Diagnosis not present

## 2020-07-16 DIAGNOSIS — T451X5A Adverse effect of antineoplastic and immunosuppressive drugs, initial encounter: Secondary | ICD-10-CM

## 2020-07-16 LAB — CEA: CEA: 1206 ng/mL — ABNORMAL HIGH (ref 0.0–4.7)

## 2020-07-16 MED ORDER — GABAPENTIN 100 MG PO CAPS: 100.0000 mg | ORAL_CAPSULE | Freq: Two times a day (BID) | ORAL | 1 refills | Status: DC | PRN

## 2020-07-16 MED ORDER — METFORMIN HCL ER 500 MG PO TB24: 2000.0000 mg | ORAL_TABLET | Freq: Every evening | ORAL | 3 refills | Status: DC

## 2020-07-16 NOTE — Progress Notes (Signed)
Virtual Visit via Video Note  I connected with@  on 07/16/20 at  1:00 PM EST by a video enabled telemedicine application and verified that I am speaking with the correct person using two identifiers.  Location patient: home Location provider:work  Persons participating in the virtual visit: patient, provider  I discussed the limitations of evaluation and management by telemedicine and the availability of in person appointments. The patient expressed understanding and agreed to proceed.   HPI:  Follow up   diabetes- Endorses eating incretion over the holidays. hasnt been walking as much however plans to walk more. Taking IV steriod every other week.  FGB 115. compliant with 50 lantus qpm, 10-15 units with lunch and dinner with humalog.Now holding humalog if less than 140.  Compliant with metformin 1000mg  qhs without diarrhea. No hypoglycemic episodes.  Wearing CGM 83%, past 14 days.    HTN- with SVT- compliant with telmisartan 40 mg daily, metoprolol succinate 100 mg QAM, amlodipine 10 mg QAM. He has never used metoprolol succinate 25 mg QPM.  Crt yesterday 0.68 Resumed HCTZ 25 mg QAM one month ago however hasnt been consistently taking. BPs at home 138/74. No dizziness, CP.      Had follow up with Dr yesterday; currently doing chemotherapy. Having peripheral neuropathy in BL hands and BL feet, worse first couple days after treatment  Anxiety, trouble sleeping- using xanax prn qhs with relief.   ROS: See pertinent positives and negatives per HPI.    EXAM:  VITALS per patient if applicable: Ht 5\' 9"  (1.753 m)   Wt 241 lb (109.3 kg)   BMI 35.59 kg/m  BP Readings from Last 3 Encounters:  07/15/20 (!) 154/83  07/01/20 (!) 153/83  06/28/20 (!) 157/79   Wt Readings from Last 3 Encounters:  07/16/20 241 lb (109.3 kg)  07/15/20 241 lb (109.3 kg)  07/01/20 239 lb 8 oz (108.6 kg)    GENERAL: alert, oriented, appears well and in no acute distress  HEENT: atraumatic,  conjunttiva clear, no obvious abnormalities on inspection of external nose and ears  NECK: normal movements of the head and neck  LUNGS: on inspection no signs of respiratory distress, breathing rate appears normal, no obvious gross SOB, gasping or wheezing  CV: no obvious cyanosis  MS: moves all visible extremities without noticeable abnormality  PSYCH/NEURO: pleasant and cooperative, no obvious depression or anxiety, speech and thought processing grossly intact  ASSESSMENT AND PLAN:  Discussed the following assessment and plan:  Problem List Items Addressed This Visit      Cardiovascular and Mediastinum   Uncontrolled hypertension    Uncontrolled. Patient hasnt been consistent with HCTZ 25mg  which he will start.  Continue telmisartan 40 mg daily, metoprolol succinate 100 mg QAM, amlodipine 10 mg QAM. He will call with BP readings so we can adjustments if needed.       Relevant Medications   hydrochlorothiazide (HYDRODIURIL) 25 MG tablet     Endocrine   Type 2 diabetes mellitus without complication, with long-term current use of insulin (HCC)    Uncontrolled, however much improved. Discussed optimizing metformin and he will increase to 2000mg  qhs. Continue lantus 50 units, humalog 10-15 units with lunch and dinner if blood sugar > 140 . Once we gain better control, I would like to decrease insulin likely prandial and re-trial jardiance 10mg . Close follow up.       Relevant Medications   metFORMIN (GLUCOPHAGE XR) 500 MG 24 hr tablet     Nervous and Auditory  Neuropathy due to chemotherapeutic drug ([place])    Chemo and DM related. Trial of gabapentin; he prefers to take prn which is how I have prescribed        Other   Insomnia    Controlled with xanax 0.5mg  qhs prn; continue         -we discussed possible serious and likely etiologies, options for evaluation and workup, limitations of telemedicine visit vs in person visit, treatment, treatment risks and precautions. Pt  prefers to treat via telemedicine empirically rather then risking or undertaking an in person visit at this moment.  .   I discussed the assessment and treatment plan with the patient. The patient was provided an opportunity to ask questions and all were answered. The patient agreed with the plan and demonstrated an understanding of the instructions.   The patient was advised to call back or seek an in-person evaluation if the symptoms worsen or if the condition fails to improve as anticipated.   Mable Paris, FNP

## 2020-07-16 NOTE — Patient Instructions (Signed)
Increase metformin to 1500mg  at bedtime for a week or so. And then increase to 2000mg  ( 4 tablets) of metformin at bedtime We will consider jardiance 10mg  once blood sugars come down further.  Try to be consistent with HCTZ 25mg  and call me with blood pressure readings in the next week or two. Goal goal of BP < 130/80.  Trial of gabapentin.    Let me know how you are doing.

## 2020-07-16 NOTE — Assessment & Plan Note (Signed)
Controlled with xanax 0.5mg  qhs prn; continue

## 2020-07-16 NOTE — Assessment & Plan Note (Signed)
Uncontrolled, however much improved. Discussed optimizing metformin and he will increase to 2000mg  qhs. Continue lantus 50 units, humalog 10-15 units with lunch and dinner if blood sugar > 140 . Once we gain better control, I would like to decrease insulin likely prandial and re-trial jardiance 10mg . Close follow up.

## 2020-07-16 NOTE — Assessment & Plan Note (Signed)
Chemo and DM related. Trial of gabapentin; he prefers to take prn which is how I have prescribed

## 2020-07-16 NOTE — Assessment & Plan Note (Signed)
Uncontrolled. Patient hasnt been consistent with HCTZ 25mg  which he will start.  Continue telmisartan 40 mg daily, metoprolol succinate 100 mg QAM, amlodipine 10 mg QAM. He will call with BP readings so we can adjustments if needed.

## 2020-07-17 ENCOUNTER — Telehealth: Payer: Self-pay

## 2020-07-17 ENCOUNTER — Inpatient Hospital Stay: Payer: Managed Care, Other (non HMO)

## 2020-07-17 VITALS — BP 150/83 | HR 89 | Temp 96.7°F | Resp 20

## 2020-07-17 DIAGNOSIS — C20 Malignant neoplasm of rectum: Secondary | ICD-10-CM

## 2020-07-17 DIAGNOSIS — C787 Secondary malignant neoplasm of liver and intrahepatic bile duct: Secondary | ICD-10-CM

## 2020-07-17 DIAGNOSIS — Z5112 Encounter for antineoplastic immunotherapy: Secondary | ICD-10-CM | POA: Diagnosis not present

## 2020-07-17 MED ORDER — SODIUM CHLORIDE 0.9% FLUSH
10.0000 mL | INTRAVENOUS | Status: DC | PRN
  Administered 2020-07-17: 10 mL
  Filled 2020-07-17: qty 10

## 2020-07-17 MED ORDER — HEPARIN SOD (PORK) LOCK FLUSH 100 UNIT/ML IV SOLN
INTRAVENOUS | Status: AC
  Filled 2020-07-17: qty 5

## 2020-07-17 MED ORDER — HEPARIN SOD (PORK) LOCK FLUSH 100 UNIT/ML IV SOLN
500.0000 [IU] | Freq: Once | INTRAVENOUS | Status: AC | PRN
  Administered 2020-07-17: 500 [IU]
  Filled 2020-07-17: qty 5

## 2020-07-17 NOTE — Telephone Encounter (Signed)
Next available CT is 1/11.  MD would like sooner, order changed to STAT.

## 2020-07-17 NOTE — Telephone Encounter (Signed)
Please schedule CT and I will notify patient when I call with results.

## 2020-07-17 NOTE — Telephone Encounter (Signed)
-----   Message from Rickard Patience, MD sent at 07/16/2020 11:16 PM EST ----- CEA is rising.  Please schedule him to get CT w contrast c/a/p, ASAP. Thanks.

## 2020-07-18 ENCOUNTER — Other Ambulatory Visit: Payer: Self-pay

## 2020-07-18 ENCOUNTER — Telehealth: Payer: Self-pay | Admitting: *Deleted

## 2020-07-18 ENCOUNTER — Ambulatory Visit
Admission: RE | Admit: 2020-07-18 | Discharge: 2020-07-18 | Disposition: A | Discharge status: Home / Self Care | Payer: Managed Care, Other (non HMO) | Source: Ambulatory Visit | Attending: Oncology | Admitting: Oncology

## 2020-07-18 DIAGNOSIS — C20 Malignant neoplasm of rectum: Secondary | ICD-10-CM | POA: Insufficient documentation

## 2020-07-18 DIAGNOSIS — C787 Secondary malignant neoplasm of liver and intrahepatic bile duct: Secondary | ICD-10-CM | POA: Diagnosis present

## 2020-07-18 MED ORDER — IOHEXOL 300 MG/ML  SOLN
100.0000 mL | Freq: Once | INTRAMUSCULAR | Status: AC | PRN
  Administered 2020-07-18: 100 mL via INTRAVENOUS

## 2020-07-18 NOTE — Telephone Encounter (Signed)
Called report  IMPRESSION: 1. Extensive hepatic metastasis, similar to minimally improved compared to 05/02/2020 MRI. 2. Possible constipation. High rectal wall thickening, likely the site of primary. No obstruction or other acute complication. 3. Suspicious borderline sized perirectal nodes, as above. 4. Right apical isolated pulmonary nodule, similar to slightly decreased. This remains indeterminate. 5. Age advanced coronary artery atherosclerosis. Recommend assessment of coronary risk factors and consideration of medical therapy. 6.  Aortic Atherosclerosis (ICD10-I70.0).  These results will be called to the ordering clinician or representative by the Radiologist Assistant, and communication documented in the PACS or [employer].   Electronically Signed   By: Jeronimo Greaves M.D.   On: 07/18/2020 11:42

## 2020-07-23 ENCOUNTER — Ambulatory Visit: Payer: Self-pay | Admitting: Licensed Clinical Social Worker

## 2020-07-23 ENCOUNTER — Encounter: Payer: Self-pay | Admitting: Licensed Clinical Social Worker

## 2020-07-23 ENCOUNTER — Telehealth: Payer: Self-pay | Admitting: Licensed Clinical Social Worker

## 2020-07-23 ENCOUNTER — Ambulatory Visit: Payer: Managed Care, Other (non HMO)

## 2020-07-23 DIAGNOSIS — C20 Malignant neoplasm of rectum: Secondary | ICD-10-CM

## 2020-07-23 DIAGNOSIS — Z1379 Encounter for other screening for genetic and chromosomal anomalies: Secondary | ICD-10-CM

## 2020-07-23 DIAGNOSIS — C7A8 Other malignant neuroendocrine tumors: Secondary | ICD-10-CM

## 2020-07-23 DIAGNOSIS — Z800 Family history of malignant neoplasm of digestive organs: Secondary | ICD-10-CM

## 2020-07-23 DIAGNOSIS — C787 Secondary malignant neoplasm of liver and intrahepatic bile duct: Secondary | ICD-10-CM

## 2020-07-23 DIAGNOSIS — Z8042 Family history of malignant neoplasm of prostate: Secondary | ICD-10-CM

## 2020-07-23 NOTE — Progress Notes (Signed)
HPI:  Dominic [name] was previously seen in the [place] clinic due to a personal history of cancer and concerns regarding a hereditary predisposition to cancer. Please refer to our prior cancer genetics clinic note for more information regarding our discussion, assessment and recommendations, at the time. Dominic [name] recent genetic test results were disclosed to him, as were recommendations warranted by these results. These results and recommendations are discussed in more detail below.  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[place])   06/03/2020 -  Chemotherapy   The patient had palonosetron (ALOXI) injection 0.25 mg, 0.25 mg, Intravenous,  Once, 4 of 8 cycles Administration: 0.25 mg (06/03/2020), 0.25 mg (07/01/2020), 0.25 mg (07/15/2020), 0.25 mg (06/17/2020) leucovorin 924 mg in dextrose 5 % 250 mL infusion, 400 mg/m2 = 924 mg, Intravenous,  Once, 4 of 8 cycles Administration: 900 mg (07/01/2020), 900 mg (07/15/2020) oxaliplatin (ELOXATIN) 200 mg in dextrose 5 % 500 mL chemo infusion, 195 mg, Intravenous,  Once, 4 of 8 cycles Administration: 200 mg (06/03/2020), 200 mg (07/01/2020), 200 mg (07/15/2020), 200 mg (06/17/2020) fluorouracil (ADRUCIL) chemo injection 900 mg, 400 mg/m2 = 900 mg, Intravenous,  Once, 4 of 8 cycles Administration: 900 mg (06/03/2020), 900 mg (07/01/2020), 900 mg (07/15/2020), 900 mg (06/17/2020) fluorouracil (ADRUCIL) 5,550 mg in sodium chloride 0.9 % 139 mL chemo infusion, 2,400 mg/m2 = 5,550 mg, Intravenous, 1 Day/Dose, 4 of 8 cycles Administration: 5,550 mg (06/03/2020), 5,550 mg (07/01/2020), 5,550 mg (07/15/2020), 5,550 mg (06/17/2020) bevacizumab-bvzr (ZIRABEV) 500 mg in sodium chloride 0.9 % 100 mL chemo infusion, 5 mg/kg = 500 mg, Intravenous,  Once, 4 of 8 cycles Administration: 500 mg (06/03/2020), 500 mg (07/01/2020), 500 mg (07/15/2020), 500 mg  (06/17/2020)  for chemotherapy treatment.    06/03/2020 Cancer Staging   Staging form: Colon and Rectum, AJCC 8th Edition - Clinical stage from 06/03/2020: Stage Unknown (cTX, cNX, pM1) - Signed by Earlie Server, MD on 06/03/2020    Genetic Testing   Negative genetic testing. No pathogenic variants identified on the [facility] CustomNext-Cancer+RNA panel. 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 The report date is 07/18/2020.  The CustomNext-Cancer + RNAinsight panel  includes sequencing and/or deletion duplication testing of the following 91 genes: AIP, ALK, APC*, ATM*, AXIN2, BAP1, BARD1, BLM, BMPR1A, BRCA1*, BRCA2*, BRIP1*, CDC73, CDH1*, CDK4, CDKN1B, CDKN2A, CHEK2*, CTNNA1, DICER1, FANCC, FH, FLCN, GALNT12, KIF1B, LZTR1, MAX, MEN1, MET, MLH1*, MRE11A, MSH2*, MSH3, MSH6*, MUTYH*, NBN, NF1*, NF2, NTHL1, PALB2*, PHOX2B, PMS2*, POT1, PRKAR1A, PTCH1, PTEN*, RAD50, RAD51C*, RAD51D*, RB1, RECQL, RET, SDHA, SDHAF2, SDHB, SDHC, SDHD, SMAD4, SMARCA4, SMARCB1, SMARCE1, STK11, SUFU, TMEM127, TP53*, TSC1, TSC2, VHL and XRCC2 (sequencing and deletion/duplication); CASR, CFTR, CPA1, CTRC, EGFR, EGLN1, FAM175A, HOXB13, KIT, MITF, MLH3, PALLD, PDGFRA, POLD1, POLE, PRSS1, RINT1, RPS20, SPINK1 and TERT (sequencing only); EPCAM and GREM1 (deletion/duplication only).      FAMILY HISTORY:  We obtained a detailed, 4-generation family history.  Significant diagnoses are listed below: Family History  Problem Relation Age of Onset  . Arthritis Mother   . Heart disease Mother   . Supraventricular tachycardia Mother        s/p ablation  . Hypertension Mother   . Diabetes Father   . Heart attack Maternal Uncle   . Throat cancer Maternal Uncle   . Esophageal cancer Maternal Uncle   . Heart attack Maternal Uncle   . Heart attack Maternal Uncle   . Prostate  cancer Maternal Uncle   . Thyroid cancer Neg Hx   . Colon cancer Neg Hx   . Rectal cancer Neg Hx   . Stomach cancer Neg Hx   . Colon polyps Neg Hx    Dominic [name] does  not have children. He has one paternal half brother. He is unaware of any cancer on his paternal side of the family, and has limited information/contact on that side.   Dominic [name] mother is living at 42. Patient has 6 maternal uncles, 1 maternal aunt. One uncle had throat/esophageal cancer and exposure to asbestos. Another uncle had prostate cancer. No known cancers in maternal cousins or grandparents.  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 Patient's maternal ancestors are of Polish/German descent, and paternal ancestors are of unknown descent. There is no reported Ashkenazi Jewish ancestry. There is no known consanguinity.     GENETIC TEST RESULTS: Genetic testing reported out on 07/18/2020 through the [facility] CustomNext+RNA cancer panel found no pathogenic mutations.   The CustomNext-Cancer + RNAinsight panel  includes sequencing and/or deletion duplication testing of the following 91 genes: AIP, ALK, APC*, ATM*, AXIN2, BAP1, BARD1, BLM, BMPR1A, BRCA1*, BRCA2*, BRIP1*, CDC73, CDH1*, CDK4, CDKN1B, CDKN2A, CHEK2*, CTNNA1, DICER1, FANCC, FH, FLCN, GALNT12, KIF1B, LZTR1, MAX, MEN1, MET, MLH1*, MRE11A, MSH2*, MSH3, MSH6*, MUTYH*, NBN, NF1*, NF2, NTHL1, PALB2*, PHOX2B, PMS2*, POT1, PRKAR1A, PTCH1, PTEN*, RAD50, RAD51C*, RAD51D*, RB1, RECQL, RET, SDHA, SDHAF2, SDHB, SDHC, SDHD, SMAD4, SMARCA4, SMARCB1, SMARCE1, STK11, SUFU, TMEM127, TP53*, TSC1, TSC2, VHL and XRCC2 (sequencing and deletion/duplication); CASR, CFTR, CPA1, CTRC, EGFR, EGLN1, FAM175A, HOXB13, KIT, MITF, MLH3, PALLD, PDGFRA, POLD1, POLE, PRSS1, RINT1, RPS20, SPINK1 and TERT (sequencing only); EPCAM and GREM1 (deletion/duplication only). .   The test report has been scanned into EPIC and is located under the Molecular Pathology section of the Results Review tab.  A portion of the result report is included below for reference.     We discussed with Dominic [name] that because current  genetic testing is not perfect, it is possible there may be a gene mutation in one of these genes that current testing cannot detect, but that chance is small.  We also discussed, that there could be another gene that has not yet been discovered, or that we have not yet tested, that is responsible for the cancer diagnoses in the family. It is also possible there is a hereditary cause for the cancer in the family that Dominic [name] did not inherit and therefore was not identified in his testing.  Therefore, it is important to remain in touch with cancer genetics in the future so that we can continue to offer Dominic [name] the most up to date genetic testing.   Genetic testing did identify a variant of uncertain significance (VUS) in the SDHD gene called c.158C>T.  At this time, it is unknown if this variant is associated with increased cancer risk or if this is a normal finding, but most variants such as this get reclassified to being inconsequential. It should not be used to make medical management decisions. With time, we suspect the lab will determine the significance of this variant, if any. If we do learn more about it, we will try to contact Dominic [name] to discuss it further. However, it is important to stay in touch with [place] periodically and keep the address and phone number up to date.  ADDITIONAL GENETIC TESTING: We discussed with Dominic [name] that his genetic testing was fairly  extensive.  If there are genes identified to increase cancer risk that can be analyzed in the future, we would be happy to discuss and coordinate this testing at that time.    CANCER SCREENING RECOMMENDATIONS: Dominic [name] test result is considered negative (normal).  This means that we have not identified a hereditary cause for his  personal and family history of cancer at this time. Most cancers happen by chance and this negative test suggests that his cancer may fall into this category.    While reassuring, this does not  definitively rule out a hereditary predisposition to cancer. It is still possible that there could be genetic mutations that are undetectable by current technology. There could be genetic mutations in genes that have not been tested or identified to increase cancer risk.  Therefore, it is recommended he continue to follow the cancer management and screening guidelines provided by his oncology and primary healthcare provider.   An individual's cancer risk and medical management are not determined by genetic test results alone. Overall cancer risk assessment incorporates additional factors, including personal medical history, family history, and any available genetic information that may result in a personalized plan for cancer prevention and surveillance.  RECOMMENDATIONS FOR FAMILY MEMBERS:  Relatives in this family might be at some increased risk of developing cancer, over the general population risk, simply due to the family history of cancer.  We recommended male relatives in this family have a yearly mammogram beginning at age 34, or 2 years younger than the earliest onset of cancer, an annual clinical breast exam, and perform monthly breast self-exams. Male relatives in this family should also have a gynecological exam as recommended by their primary provider.  All family members should be referred for colonoscopy starting at age 49.   FOLLOW-UP: Lastly, we discussed with Dominic [name] that cancer genetics is a rapidly advancing field and it is possible that new genetic tests will be appropriate for him and/or his family members in the future. We encouraged him to remain in contact with cancer genetics on an annual basis so we can update his personal and family histories and let him know of advances in cancer genetics that may benefit this family.   Our contact number was provided. Dominic [name] questions were answered to his satisfaction, and he knows he is welcome to call us at anytime with  additional questions or concerns.   Faith Rogue, MS, [facility] Genetic Counselor [email or web address] Phone: [phone]

## 2020-07-23 NOTE — Telephone Encounter (Signed)
Revealed negative genetic testing.  Revealed that a VUS in SDHD was identified.  We discussed that we do not know why he has cancer or why there is cancer in the family. It could be due to a different gene that we are not testing, or something our current technology cannot pick up.  It will be important for him to keep in contact with genetics to learn if additional testing may be needed in the future.

## 2020-07-26 ENCOUNTER — Telehealth: Payer: Self-pay | Admitting: Oncology

## 2020-07-26 NOTE — Telephone Encounter (Signed)
07/26/2020 Called pt to inform him that appts have been moved out to later in the AM per Dr. Tasia Catchings due to inclement weather. Pt confirmed these changes SRW

## 2020-07-29 ENCOUNTER — Other Ambulatory Visit: Payer: Managed Care, Other (non HMO)

## 2020-07-29 ENCOUNTER — Ambulatory Visit: Payer: Managed Care, Other (non HMO)

## 2020-07-29 ENCOUNTER — Inpatient Hospital Stay: Payer: Managed Care, Other (non HMO)

## 2020-07-29 ENCOUNTER — Encounter: Payer: Self-pay | Admitting: Oncology

## 2020-07-29 ENCOUNTER — Other Ambulatory Visit: Payer: Self-pay

## 2020-07-29 ENCOUNTER — Ambulatory Visit: Payer: Managed Care, Other (non HMO) | Admitting: Oncology

## 2020-07-29 ENCOUNTER — Inpatient Hospital Stay (HOSPITAL_BASED_OUTPATIENT_CLINIC_OR_DEPARTMENT_OTHER): Payer: Managed Care, Other (non HMO) | Admitting: Oncology

## 2020-07-29 ENCOUNTER — Inpatient Hospital Stay: Payer: Managed Care, Other (non HMO) | Admitting: Oncology

## 2020-07-29 VITALS — BP 142/86 | HR 88 | Temp 99.1°F | Resp 18 | Wt 244.4 lb

## 2020-07-29 DIAGNOSIS — Z5112 Encounter for antineoplastic immunotherapy: Secondary | ICD-10-CM | POA: Diagnosis not present

## 2020-07-29 DIAGNOSIS — C7A8 Other malignant neuroendocrine tumors: Secondary | ICD-10-CM

## 2020-07-29 DIAGNOSIS — C787 Secondary malignant neoplasm of liver and intrahepatic bile duct: Secondary | ICD-10-CM

## 2020-07-29 DIAGNOSIS — C7951 Secondary malignant neoplasm of bone: Secondary | ICD-10-CM

## 2020-07-29 DIAGNOSIS — Z5111 Encounter for antineoplastic chemotherapy: Secondary | ICD-10-CM | POA: Diagnosis not present

## 2020-07-29 DIAGNOSIS — C20 Malignant neoplasm of rectum: Secondary | ICD-10-CM

## 2020-07-29 DIAGNOSIS — G620 Drug-induced polyneuropathy: Secondary | ICD-10-CM | POA: Diagnosis not present

## 2020-07-29 DIAGNOSIS — E1365 Other specified diabetes mellitus with hyperglycemia: Secondary | ICD-10-CM

## 2020-07-29 DIAGNOSIS — Z7189 Other specified counseling: Secondary | ICD-10-CM

## 2020-07-29 DIAGNOSIS — T451X5A Adverse effect of antineoplastic and immunosuppressive drugs, initial encounter: Secondary | ICD-10-CM

## 2020-07-29 LAB — CBC WITH DIFFERENTIAL/PLATELET
Abs Immature Granulocytes: 0.02 10*3/uL (ref 0.00–0.07)
Basophils Absolute: 0.1 10*3/uL (ref 0.0–0.1)
Basophils Relative: 1 %
Eosinophils Absolute: 0.2 10*3/uL (ref 0.0–0.5)
Eosinophils Relative: 2 %
HCT: 41.1 % (ref 39.0–52.0)
Hemoglobin: 14.2 g/dL (ref 13.0–17.0)
Immature Granulocytes: 0 %
Lymphocytes Relative: 25 %
Lymphs Abs: 2 10*3/uL (ref 0.7–4.0)
MCH: 27.4 pg (ref 26.0–34.0)
MCHC: 34.5 g/dL (ref 30.0–36.0)
MCV: 79.2 fL — ABNORMAL LOW (ref 80.0–100.0)
Monocytes Absolute: 0.8 10*3/uL (ref 0.1–1.0)
Monocytes Relative: 10 %
Neutro Abs: 5 10*3/uL (ref 1.7–7.7)
Neutrophils Relative %: 62 %
Platelets: 178 10*3/uL (ref 150–400)
RBC: 5.19 MIL/uL (ref 4.22–5.81)
RDW: 16.5 % — ABNORMAL HIGH (ref 11.5–15.5)
WBC: 8 10*3/uL (ref 4.0–10.5)
nRBC: 0 % (ref 0.0–0.2)

## 2020-07-29 LAB — COMPREHENSIVE METABOLIC PANEL
ALT: 15 U/L (ref 0–44)
AST: 21 U/L (ref 15–41)
Albumin: 3.8 g/dL (ref 3.5–5.0)
Alkaline Phosphatase: 87 U/L (ref 38–126)
Anion gap: 12 (ref 5–15)
BUN: 13 mg/dL (ref 6–20)
CO2: 24 mmol/L (ref 22–32)
Calcium: 8.7 mg/dL — ABNORMAL LOW (ref 8.9–10.3)
Chloride: 97 mmol/L — ABNORMAL LOW (ref 98–111)
Creatinine, Ser: 0.72 mg/dL (ref 0.61–1.24)
GFR, Estimated: >60 mL/min (ref >60)
Glucose, Bld: 295 mg/dL — ABNORMAL HIGH (ref 70–99)
Potassium: 3.8 mmol/L (ref 3.5–5.1)
Sodium: 133 mmol/L — ABNORMAL LOW (ref 135–145)
Total Bilirubin: 0.6 mg/dL (ref 0.3–1.2)
Total Protein: 7 g/dL (ref 6.5–8.1)

## 2020-07-29 LAB — PROTEIN, URINE, RANDOM: Total Protein, Urine: <6 mg/dL

## 2020-07-29 MED ORDER — LEUCOVORIN CALCIUM INJECTION 350 MG
390.0000 mg/m2 | Freq: Once | INTRAVENOUS | Status: AC
  Administered 2020-07-29: 900 mg via INTRAVENOUS
  Filled 2020-07-29: qty 45

## 2020-07-29 MED ORDER — SODIUM CHLORIDE 0.9 % IV SOLN
5.0000 mg/kg | Freq: Once | INTRAVENOUS | Status: AC
  Administered 2020-07-29: 500 mg via INTRAVENOUS
  Filled 2020-07-29: qty 16

## 2020-07-29 MED ORDER — FLUOROURACIL CHEMO INJECTION 2.5 GM/50ML
400.0000 mg/m2 | Freq: Once | INTRAVENOUS | Status: AC
  Administered 2020-07-29: 900 mg via INTRAVENOUS
  Filled 2020-07-29: qty 18

## 2020-07-29 MED ORDER — SODIUM CHLORIDE 0.9 % IV SOLN
Freq: Once | INTRAVENOUS | Status: AC
  Filled 2020-07-29: qty 250

## 2020-07-29 MED ORDER — PALONOSETRON HCL INJECTION 0.25 MG/5ML
0.2500 mg | Freq: Once | INTRAVENOUS | Status: AC
  Administered 2020-07-29: 0.25 mg via INTRAVENOUS
  Filled 2020-07-29: qty 5

## 2020-07-29 MED ORDER — LACTULOSE 10 GM/15ML PO SOLN: 10.0000 g | Freq: Two times a day (BID) | ORAL | 0 refills | Status: DC | PRN

## 2020-07-29 MED ORDER — SODIUM CHLORIDE 0.9 % IV SOLN
10.0000 mg | Freq: Once | INTRAVENOUS | Status: AC
  Administered 2020-07-29: 10 mg via INTRAVENOUS
  Filled 2020-07-29: qty 10

## 2020-07-29 MED ORDER — SODIUM CHLORIDE 0.9% FLUSH
10.0000 mL | Freq: Once | INTRAVENOUS | Status: AC
  Administered 2020-07-29: 10 mL via INTRAVENOUS
  Filled 2020-07-29: qty 10

## 2020-07-29 MED ORDER — OXALIPLATIN CHEMO INJECTION 100 MG/20ML
86.0000 mg/m2 | Freq: Once | INTRAVENOUS | Status: AC
  Administered 2020-07-29: 200 mg via INTRAVENOUS
  Filled 2020-07-29: qty 40

## 2020-07-29 MED ORDER — SODIUM CHLORIDE 0.9 % IV SOLN
2400.0000 mg/m2 | INTRAVENOUS | Status: DC
  Administered 2020-07-29: 5550 mg via INTRAVENOUS
  Filled 2020-07-29: qty 111

## 2020-07-29 MED ORDER — SENNA 8.6 MG PO TABS: 2.0000 | ORAL_TABLET | Freq: Every day | ORAL | 0 refills | Status: AC

## 2020-07-29 MED ORDER — DEXTROSE 5 % IV SOLN
Freq: Once | INTRAVENOUS | Status: AC
  Filled 2020-07-29: qty 250

## 2020-07-29 NOTE — Progress Notes (Signed)
Pt here for follow. No new concerns voiced.  

## 2020-07-29 NOTE — Progress Notes (Signed)
Pt tolerated infusion well with no signs of complications. VSS. Pt stable for discharge.   Dominic [name]  

## 2020-07-29 NOTE — Progress Notes (Signed)
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  male with PMH listed below was seen in consultation at the request of  Burnard Hawthorne, FNP  for evaluation of liver and pancreatic mass  #reports symptoms of upper abdomen band like discomfort and bloating for a few months, improved symptoms with omeprazole,.Chronic constipation, he has blood in the stool occationally Seen by PCP in September 2021. AlK phosphatase was recently noted to be elevated as well well elevated GGT. He was referred to see gastroenteralgias on 04/10/2020.  04/25/2020 EGD showed gastric fundus mild inflammation. Biopsy showed reactive gastropathy.  04/30/2020 [place] RUQ showed cirrhosis with multiple hepatic masses.  05/01/2020 AFP 1.7 05/02/2020 MR liver w wo contrast showed numerous heterozygous enhancing liver lesions, throughout the liver, not typical for [facility], likely metastatic disease.  Solid appearing enhancing lesion in pancreatic tail 1.9x1.5cm, suspicious for primary neoplasm.  Mild adenopathy within porta hepatic region and porta hepatic region. Splenomegaly.   Patient has history of SVT, psoriatic arthritis, morbid obesity, GERD, DM.  Family history + maternal uncle with esophageal cancer.   + unintentional weight loss, no fever, chills. "sweats a lot". Currently on antibiotics for proctitis.  He works at [employer], lives in [place]. He lives with his partner.  During today's visit, he felt  dizziness and lightheaded when sitting on the examination table.  He lost consciousness briefly for about 2-3 minutes, regain consciousness spontaneously, perfusing sweating. SBP in 90s, and HR initially in 30-40s, later improved to 70s.  He denies any chest pain, SOB. Blood sugar was 207.   # 05/17/2020, colonoscopy showed a fungating and infiltrative partially obstructing large mass in the proximal rectum and in the mid rectum.  The mass was partially circumferential, involving two thirds of the lumen circumference is.  Biopsy showed adenocarcinoma.  05/23/2020, patient underwent EUS biopsy of the pancreatic tail lesion.  Biopsy pathology showed well-differentiated neuroendocrine tumor.  Ki-67 is noncontributory due to insufficient tissue in the cell block.  Genetic testing is negative.   INTERVAL HISTORY Dominic [name] is a 50 y.o. 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 Problems and complaints are listed below: Status post 4 cycles of FOLFOX with bevacizumab. Overall he tolerates well.  CEA progressively rising, so he has had CT scan done during the interval .  Constipation, he uses miralax daily. Able to have small amount of bowel movement this morning.   Review of Systems  Constitutional: Positive for fatigue. Negative for appetite change, chills, fever and unexpected weight change.  HENT:   Negative for hearing loss and voice change.   Eyes: Negative for eye problems and icterus.  Respiratory: Negative for chest tightness, cough and shortness of breath.   Cardiovascular: Negative for chest pain and leg swelling.  Gastrointestinal: Negative for abdominal distention and abdominal pain.  Endocrine: Negative for hot flashes.  Genitourinary: Negative for difficulty urinating, dysuria and frequency.   Musculoskeletal: Negative for arthralgias.  Skin: Positive for rash.  Negative for itching.  Neurological: Positive for numbness. Negative for light-headedness.  Hematological: Negative for adenopathy. Does not bruise/bleed easily.  Psychiatric/Behavioral: Negative for confusion.    MEDICAL HISTORY:  Past Medical History:  Diagnosis Date  . Anxiety   . Cancer [facility])    Rectal cancer metastasized to liver (HCC)  . Diabetes mellitus without complication (HCC)   . Dysrhythmia    svt  . Family history of esophageal cancer   . Family history of prostate cancer   . GERD (gastroesophageal reflux disease)   . High triglycerides   . Hypertension   . Long-term insulin use in type 2 diabetes (HCC)   . Morbid obesity (HCC)   . Psoriatic arthritis (HCC)   . SVT (supraventricular tachycardia) (HCC)     SURGICAL HISTORY: Past Surgical History:  Procedure Laterality Date  . EUS N/A 05/23/2020   Procedure: FULL UPPER ENDOSCOPIC ULTRASOUND (EUS) RADIAL;  Surgeon: Rayann Heman, MD;  Location: [facility] ENDOSCOPY;  Service: Endoscopy;  Laterality: N/A;  . PORTA CATH INSERTION N/A 05/30/2020   Procedure: PORTA CATH INSERTION;  Surgeon: Annice Needy, MD;  Location: [facility] INVASIVE CV LAB;  Service: Cardiovascular;  Laterality: N/A;  . UPPER GASTROINTESTINAL ENDOSCOPY      SOCIAL HISTORY: Social History   Socioeconomic History  . Marital status: Media planner    Spouse name: Derryl Harbor  . Number of children: Not on file  . Years of education: Not on file  . Highest education level: Not on file  Occupational History  . Not on file  Tobacco Use  . Smoking status: Never Smoker  . Smokeless tobacco: Never Used  Vaping Use  . Vaping Use: Never used  Substance and Sexual Activity  . Alcohol use: Yes    Alcohol/week: 1.0 standard drink    Types: 1 Glasses of wine per week    Comment: very occasional  . Drug use: Never  . Sexual activity: Not on file  Other Topics Concern  . Not on file  Social History Narrative   Lives in [place] partner [place].  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     Social Determinants of Health   Financial Resource Strain: Medium Risk  . Difficulty of Paying Living Expenses: Somewhat hard  Food Insecurity: Not on file  Transportation Needs: Not on file  Physical Activity: Not on file  Stress: Not on file  Social Connections: Not on file  Intimate Partner Violence: Not on file    FAMILY HISTORY: Family History  Problem Relation Age of Onset  . Arthritis Mother   . Heart disease Mother   . Supraventricular tachycardia Mother        s/p ablation  . Hypertension Mother   . Diabetes Father   . Heart attack Maternal Uncle   . Throat cancer Maternal Uncle   . Esophageal cancer Maternal Uncle   . Heart attack Maternal Uncle   . Heart attack Maternal Uncle   . Prostate cancer Maternal Uncle   . Thyroid cancer Neg Hx   . Colon cancer Neg Hx   . Rectal cancer Neg Hx   . Stomach cancer Neg Hx   . Colon polyps Neg Hx     ALLERGIES:  has No Known Allergies.  MEDICATIONS:  Current Outpatient Medications  Medication Sig Dispense Refill  . ALPRAZolam (XANAX) 0.5 MG tablet Take half tablet to one tablet by mouth as needed daily for insomnia, anxiety. 30 tablet 0  . amLODipine (NORVASC) 10 MG tablet TAKE 1 TABLET  BY MOUTH  DAILY 90 tablet 3  . Continuous Blood Gluc Sensor (FREESTYLE LIBRE 2 SENSOR) MISC Scan glucose at least QID 2 each 11  . CONTOUR NEXT TEST test strip USE 1 STRIP TO TEST BLOOD  SUGAR TWICE DAILY 200 strip 3  . famotidine (PEPCID) 20 MG tablet Take 1 tablet (20 mg total) by mouth 2 (two) times daily. 120 tablet 5  . hydrochlorothiazide (HYDRODIURIL) 25 MG tablet Take 25 mg by mouth daily.    . insulin lispro (HUMALOG KWIKPEN) 100 UNIT/ML KwikPen Inject 10 units into the skin 10 minutes prior to lunch & dinner. Hold if glucose is less than 150. (Patient taking differently: 10 Units. Inject 10-15 units into the skin 10 minutes prior to lunch & dinner. Hold if glucose is less than 150.) 15 mL 5  . Insulin Pen Needle (PEN  NEEDLES) 32G X 6 MM MISC Used to give insulin injections 4 times daily. 100 each 10  . lactulose (CHRONULAC) 10 GM/15ML solution Take 15 mLs (10 g total) by mouth 2 (two) times daily as needed for moderate constipation or severe constipation. 236 mL 0  . Lancets (ONETOUCH ULTRASOFT) lancets Used to check blood sugars twice daily. 100 each 12  . LANTUS SOLOSTAR 100 UNIT/ML Solostar Pen INJECT SUBCUTANEOUSLY 50  UNITS DAILY (Patient taking differently: Inject 50 Units into the skin.) 45 mL 3  . lidocaine-prilocaine (EMLA) cream Apply to affected area once 30 g 3  . [name] Take 5 mg by mouth at bedtime as needed.     . metFORMIN (GLUCOPHAGE XR) 500 MG 24 hr tablet Take 4 tablets (2,000 mg total) by mouth every evening. 120 tablet 3  . metoprolol succinate (TOPROL-XL) 100 MG 24 hr tablet TAKE 1 TABLET BY MOUTH  DAILY WITH OR IMMEDIATELY  FOLLOWING A MEAL 90 tablet 3  . ondansetron (ZOFRAN ODT) 4 MG disintegrating tablet Take 1 tablet (4 mg total) by mouth every 8 (eight) hours as needed for nausea or vomiting. 30 tablet 2  . ondansetron (ZOFRAN) 8 MG tablet Take 1 tablet (8 mg total) by mouth 2 (two) times daily as needed for refractory nausea / vomiting. Start on day 3 after chemotherapy. 30 tablet 1  . polyethylene glycol powder (GLYCOLAX/MIRALAX) 17 GM/SCOOP powder Take by mouth daily. 1/2 capful QD    . prochlorperazine (COMPAZINE) 10 MG tablet Take 1 tablet (10 mg total) by mouth every 6 (six) hours as needed (Nausea or vomiting). 30 tablet 1  . senna (SENOKOT) 8.6 MG TABS tablet Take 2 tablets (17.2 mg total) by mouth daily. 120 tablet 0  . telmisartan (MICARDIS) 40 MG tablet Take 1 tablet (40 mg total) by mouth daily. PLEASE CALL OFFICE TO SCHEDULE FOLLOW UP APPOINTMENT FOR ANYMORE REFILLS. 90 tablet 0  . gabapentin (NEURONTIN) 100 MG capsule Take 1 capsule (100 mg total) by mouth 2 (two) times daily as needed. (Patient not taking: Reported on 07/29/2020) 60 capsule 1  . tamsulosin (FLOMAX)  0.4 MG CAPS capsule Take 1 capsule (0.4 mg total) by mouth daily. (Patient not taking: Reported on 07/29/2020) 30 capsule 1   Current Facility-Administered Medications  Medication Dose Route Frequency Provider Last Rate Last Admin  . 0.9 %  sodium chloride infusion  500 mL Intravenous Once Doran Stabler, MD       Facility-Administered Medications Ordered in Other Visits  Medication Dose Route Frequency Provider Last [place]  . fluorouracil (ADRUCIL) 5,550 mg in sodium chloride 0.9 % 139 mL  chemo infusion  2,400 mg/m2 (Treatment Plan Recorded) Intravenous 1 day or 1 dose Earlie Server, MD   5,550 mg at 07/29/20 1600     PHYSICAL EXAMINATION: ECOG PERFORMANCE STATUS: 1 - Symptomatic but completely ambulatory Vitals:   07/29/20 1123  BP: (!) 142/86  Pulse: 88  Resp: 18  Temp: 99.1 F (37.3 C)   Filed Weights   07/29/20 1123  Weight: 244 lb 6.4 oz (110.9 kg)    Physical Exam Constitutional:      General: He is not in acute distress. HENT:     Head: Normocephalic and atraumatic.  Eyes:     General: No scleral icterus. Cardiovascular:     Rate and Rhythm: Normal rate and regular rhythm.     Heart sounds: Normal heart sounds.  Pulmonary:     Effort: Pulmonary effort is normal. No respiratory distress.     Breath sounds: No wheezing.  Abdominal:     General: Bowel sounds are normal. There is no distension.     Palpations: Abdomen is soft.  Musculoskeletal:        General: No deformity. Normal range of motion.     Cervical back: Normal range of motion and neck supple.  Skin:    General: Skin is warm and dry.     Findings: No erythema or rash.  Neurological:     Mental Status: He is alert and oriented to person, place, and time. Mental status is at baseline.     Cranial Nerves: No cranial nerve deficit.     Coordination: Coordination normal.  Psychiatric:        Mood and Affect: Mood normal.     LABORATORY DATA:  I have reviewed the data as listed Lab Results   Component Value Date   WBC 8.0 07/29/2020   HGB 14.2 07/29/2020   HCT 41.1 07/29/2020   MCV 79.2 (L) 07/29/2020   PLT 178 07/29/2020   Recent Labs    04/10/20 1021 04/25/20 0942 05/07/20 0933 06/03/20 0815 06/21/20 0908 06/28/20 1019 07/15/20 0821 07/29/20 1103  NA  --   --  136   < > 135 134* 133* 133*  K  --   --  4.4   < > 4.8 4.1 3.6 3.8  CL  --   --  98   < > 95* 99 100 97*  CO2  --   --  21   < > _0 GLUCOSE  --   --  175*   < > 202* 365* 194* 295*  BUN 18  --  12   < > _1 CREATININE 0.91  --  0.86   < > 0.80 0.75 0.68 0.72  CALCIUM  --   --  9.2   < > 9.2 9.1 8.8* 8.7*  GFRNONAA 98  --  101   < > 104 >60 >60 >60  GFRAA 113  --  117  --  120  --   --   --   PROT  --  6.5  --    < >  --  7.4 6.9 7.0  ALBUMIN  --  3.1*  --    < >  --  3.6 3.5 3.8  AST  --  18  --    < >  --  _2 ALT  --  17  --    < >  --  _3 ALKPHOS  --  215*  --    < >  --  112 88 87  BILITOT  --  0.7  --    < >  --  0.5 0.6 0.6  BILIDIR  --  0.47*  --   --   --   --   --   --    < > = values in this interval not displayed.   Iron/TIBC/Ferritin/ %Sat No results found for: IRON, TIBC, FERRITIN, IRONPCTSAT    RADIOGRAPHIC STUDIES: I have personally reviewed the radiological images as listed and agreed with the findings in the report. CT CHEST ABDOMEN PELVIS W CONTRAST  Result Date: 07/18/2020 CLINICAL DATA:  Rectal cancer with liver metastasis. Rising CEA. Severe constipation after chemotherapy Monday. EXAM: CT CHEST, ABDOMEN, AND PELVIS WITH CONTRAST TECHNIQUE: Multidetector CT imaging of the chest, abdomen and pelvis was performed following the standard protocol during bolus administration of intravenous contrast. CONTRAST:  169m OMNIPAQUE IOHEXOL 300 MG/ML  SOLN COMPARISON:  05/15/2020 chest CT. 05/02/2020 abdominal MRI. No prior pelvic imaging. FINDINGS: CT CHEST FINDINGS Cardiovascular: Right Port-A-Cath tip at superior caval/atrial junction. Aortic  atherosclerosis. Normal heart size, without pericardial effusion. Multivessel coronary artery atherosclerosis. No central pulmonary embolism, on this non-dedicated study. Mediastinum/Nodes: No supraclavicular adenopathy. No middle mediastinal or hilar adenopathy. Isolated anterior mediastinal node is upper normal sized at 5 mm today versus similar on the prior exam (when remeasured). Lungs/Pleura: No pleural fluid. Right apical pulmonary nodule measures 5 mm on 26/4 versus 6 mm on the prior exam (when remeasured). Musculoskeletal: Mild, left greater than right gynecomastia. No acute osseous abnormality. CT ABDOMEN PELVIS FINDINGS Hepatobiliary: Bilateral hepatic metastasis again identified. A mass or conglomerate of masses within the right hepatic dome measures on the order of 9.0 by 8.8 cm on 48/2. 10.1 x 9.0 cm on the prior MRI (when remeasured). Segment 2/4A lesion measures 4.8 x 4.5 cm on 51/2 versus 4.7 x 4.7 cm on the prior exam (when remeasured). Dominant right pericholecystic hepatic mass measures 6.9 x 6.9 cm on 69/2. Compare 6.8 x 7.7 cm at the same level on the prior MRI (when remeasured). Normal gallbladder, without biliary ductal dilatation. Pancreas: Normal, without mass or ductal dilatation. Spleen: Hypoattenuating wedge-shaped splenic lesion of 2.0 cm on 58/2 is similar to the prior MRI, favoring remote infarct. Adrenals/Urinary Tract: Normal adrenal glands. Too small to characterize lesions in both kidneys. No hydronephrosis. Stomach/Bowel: Normal stomach, without wall thickening. Colonic stool burden suggests constipation. Wall thickening and underdistention within the upper rectum including on 108/2 may represent the rectal primary. No obstruction at the site. Normal terminal ileum and appendix. Normal small bowel. Vascular/Lymphatic: Aortic atherosclerosis. No abdominal or pelvic sidewall adenopathy. A high left perirectal 5 mm node on 107/2 is suspicious based on location and clinical history.  There is also a borderline sized 5 mm node within the inferior sigmoid mesocolon on 103/2. Reproductive: Normal prostate. Other: No significant free fluid. No evidence of omental or peritoneal disease. Musculoskeletal: Scattered tiny sclerotic lesions throughout the pelvis are likely bone islands. IMPRESSION: 1. Extensive hepatic metastasis, similar to minimally improved compared to 05/02/2020 MRI. 2. Possible constipation. High rectal wall thickening, likely the site of primary. No obstruction or other acute complication. 3. Suspicious borderline sized perirectal nodes, as above. 4. Right apical isolated pulmonary nodule, similar to slightly decreased. This remains indeterminate. 5. Age advanced coronary artery atherosclerosis. Recommend assessment of coronary risk factors and consideration of medical therapy. 6.  Aortic Atherosclerosis (ICD10-I70.0). These results will be called  to the ordering clinician or representative by the Radiologist Assistant, and communication documented in the PACS or [employer]. Electronically Signed   By: Abigail Miyamoto M.D.   On: 07/18/2020 11:42      ASSESSMENT & PLAN:  1. Rectal cancer metastasized to liver ([place])   2. Neuroendocrine carcinoma of pancreas ([place])   3. Encounter for antineoplastic chemotherapy   4. Neuropathy due to chemotherapeutic drug ([place])   5. Uncontrolled other specified diabetes mellitus with hyperglycemia ([place])   Cancer Staging Rectal cancer metastasized to liver [facility]) Staging form: Colon and Rectum, AJCC 8th Edition - Clinical stage from 06/03/2020: Stage Unknown (cTX, cNX, pM1) - Signed by Earlie Server, MD on 06/03/2020   #Stage IV rectal cancer S/p 4 cycle of FOLFOX and bevacizumab.   Overall he tolerates well CT images were independantly reviewed and discussed with patient.  Overall stable radiographic appearance- extensive liver metastasis similar or minimally improved.  Borderline peri rectal nodes.  Right lung nodule, simiar to  slightly decreased. Constipation.  I recommend continue same regimen, with short term CT follow up/  Labs are reviewed and discussed with patient. Proceed with cycle 5 FOLFOX with Bevacizumab.   # Chemotherapy induced neuropathy. Grade 1  Continue observation.   #Pancreatic well differentiated neuroendocrine carcinoma, lesion is 1.9 x 1.5cm Normal chromogranin A.  Normal 5 HIAA level. Will obtain Dotadate PET in the future. Priority now is his metastatic rectal cancer.  observation of neuroendocrine carcinoma vs Sandostatin treatment   #Uncontrolled diabetes,  continue follow-up with primary care provider for glycemic control. Recommend her to be comply with strict diabetic dieting.   all questions were answered. The patient knows to call the clinic with any problems questions or concerns.  cc Burnard Hawthorne, FNP    Return of visit: 2weeks Earlie Server, MD, PhD Hematology Oncology [facility] Pager- [phone] 07/29/2020

## 2020-07-30 LAB — CEA: CEA: 1399 ng/mL — ABNORMAL HIGH (ref 0.0–4.7)

## 2020-07-31 ENCOUNTER — Other Ambulatory Visit: Payer: Self-pay

## 2020-07-31 ENCOUNTER — Inpatient Hospital Stay: Payer: Managed Care, Other (non HMO)

## 2020-07-31 DIAGNOSIS — C20 Malignant neoplasm of rectum: Secondary | ICD-10-CM

## 2020-07-31 DIAGNOSIS — Z5112 Encounter for antineoplastic immunotherapy: Secondary | ICD-10-CM | POA: Diagnosis not present

## 2020-07-31 MED ORDER — SODIUM CHLORIDE 0.9% FLUSH
10.0000 mL | INTRAVENOUS | Status: DC | PRN
  Administered 2020-07-31: 10 mL
  Filled 2020-07-31: qty 10

## 2020-07-31 MED ORDER — HEPARIN SOD (PORK) LOCK FLUSH 100 UNIT/ML IV SOLN
500.0000 [IU] | Freq: Once | INTRAVENOUS | Status: AC | PRN
  Administered 2020-07-31: 500 [IU]
  Filled 2020-07-31: qty 5

## 2020-08-09 ENCOUNTER — Ambulatory Visit: Payer: Managed Care, Other (non HMO) | Admitting: Family

## 2020-08-12 ENCOUNTER — Inpatient Hospital Stay: Payer: Managed Care, Other (non HMO)

## 2020-08-12 ENCOUNTER — Encounter: Payer: Self-pay | Admitting: Oncology

## 2020-08-12 ENCOUNTER — Inpatient Hospital Stay (HOSPITAL_BASED_OUTPATIENT_CLINIC_OR_DEPARTMENT_OTHER): Payer: Managed Care, Other (non HMO) | Admitting: Oncology

## 2020-08-12 VITALS — BP 152/77 | HR 89 | Temp 99.3°F | Resp 18 | Wt 241.7 lb

## 2020-08-12 DIAGNOSIS — C20 Malignant neoplasm of rectum: Secondary | ICD-10-CM

## 2020-08-12 DIAGNOSIS — Z5111 Encounter for antineoplastic chemotherapy: Secondary | ICD-10-CM

## 2020-08-12 DIAGNOSIS — C787 Secondary malignant neoplasm of liver and intrahepatic bile duct: Secondary | ICD-10-CM

## 2020-08-12 DIAGNOSIS — T451X5A Adverse effect of antineoplastic and immunosuppressive drugs, initial encounter: Secondary | ICD-10-CM

## 2020-08-12 DIAGNOSIS — C7951 Secondary malignant neoplasm of bone: Secondary | ICD-10-CM

## 2020-08-12 DIAGNOSIS — G620 Drug-induced polyneuropathy: Secondary | ICD-10-CM

## 2020-08-12 DIAGNOSIS — Z5112 Encounter for antineoplastic immunotherapy: Secondary | ICD-10-CM | POA: Diagnosis not present

## 2020-08-12 DIAGNOSIS — C7A8 Other malignant neuroendocrine tumors: Secondary | ICD-10-CM | POA: Diagnosis not present

## 2020-08-12 LAB — CBC WITH DIFFERENTIAL/PLATELET
Abs Immature Granulocytes: 0.04 10*3/uL (ref 0.00–0.07)
Basophils Absolute: 0 10*3/uL (ref 0.0–0.1)
Basophils Relative: 0 %
Eosinophils Absolute: 0.2 10*3/uL (ref 0.0–0.5)
Eosinophils Relative: 2 %
HCT: 41.9 % (ref 39.0–52.0)
Hemoglobin: 14.5 g/dL (ref 13.0–17.0)
Immature Granulocytes: 0 %
Lymphocytes Relative: 20 %
Lymphs Abs: 1.8 10*3/uL (ref 0.7–4.0)
MCH: 27.5 pg (ref 26.0–34.0)
MCHC: 34.6 g/dL (ref 30.0–36.0)
MCV: 79.5 fL — ABNORMAL LOW (ref 80.0–100.0)
Monocytes Absolute: 0.9 10*3/uL (ref 0.1–1.0)
Monocytes Relative: 10 %
Neutro Abs: 6.2 10*3/uL (ref 1.7–7.7)
Neutrophils Relative %: 68 %
Platelets: 174 10*3/uL (ref 150–400)
RBC: 5.27 MIL/uL (ref 4.22–5.81)
RDW: 16.1 % — ABNORMAL HIGH (ref 11.5–15.5)
WBC: 9.2 10*3/uL (ref 4.0–10.5)
nRBC: 0 % (ref 0.0–0.2)

## 2020-08-12 LAB — COMPREHENSIVE METABOLIC PANEL
ALT: 16 U/L (ref 0–44)
AST: 29 U/L (ref 15–41)
Albumin: 3.7 g/dL (ref 3.5–5.0)
Alkaline Phosphatase: 100 U/L (ref 38–126)
Anion gap: 7 (ref 5–15)
BUN: 12 mg/dL (ref 6–20)
CO2: 28 mmol/L (ref 22–32)
Calcium: 8.9 mg/dL (ref 8.9–10.3)
Chloride: 93 mmol/L — ABNORMAL LOW (ref 98–111)
Creatinine, Ser: 0.9 mg/dL (ref 0.61–1.24)
GFR, Estimated: >60 mL/min (ref >60)
Glucose, Bld: 364 mg/dL — ABNORMAL HIGH (ref 70–99)
Potassium: 3.4 mmol/L — ABNORMAL LOW (ref 3.5–5.1)
Sodium: 128 mmol/L — ABNORMAL LOW (ref 135–145)
Total Bilirubin: 1 mg/dL (ref 0.3–1.2)
Total Protein: 7.7 g/dL (ref 6.5–8.1)

## 2020-08-12 MED ORDER — SODIUM CHLORIDE 0.9 % IV SOLN
2400.0000 mg/m2 | INTRAVENOUS | Status: DC
  Administered 2020-08-12: 5550 mg via INTRAVENOUS
  Filled 2020-08-12: qty 111

## 2020-08-12 MED ORDER — OXALIPLATIN CHEMO INJECTION 100 MG/20ML
86.5000 mg/m2 | Freq: Once | INTRAVENOUS | Status: AC
  Administered 2020-08-12: 200 mg via INTRAVENOUS
  Filled 2020-08-12: qty 40

## 2020-08-12 MED ORDER — PALONOSETRON HCL INJECTION 0.25 MG/5ML
0.2500 mg | Freq: Once | INTRAVENOUS | Status: AC
  Administered 2020-08-12: 0.25 mg via INTRAVENOUS
  Filled 2020-08-12: qty 5

## 2020-08-12 MED ORDER — LEUCOVORIN CALCIUM INJECTION 350 MG
390.0000 mg/m2 | Freq: Once | INTRAVENOUS | Status: AC
  Administered 2020-08-12: 900 mg via INTRAVENOUS
  Filled 2020-08-12: qty 20

## 2020-08-12 MED ORDER — SODIUM CHLORIDE 0.9 % IV SOLN
5.0000 mg/kg | Freq: Once | INTRAVENOUS | Status: AC
  Administered 2020-08-12: 500 mg via INTRAVENOUS
  Filled 2020-08-12: qty 16

## 2020-08-12 MED ORDER — SODIUM CHLORIDE 0.9% FLUSH
10.0000 mL | Freq: Once | INTRAVENOUS | Status: AC
  Administered 2020-08-12: 10 mL via INTRAVENOUS
  Filled 2020-08-12: qty 10

## 2020-08-12 MED ORDER — FLUOROURACIL CHEMO INJECTION 2.5 GM/50ML
400.0000 mg/m2 | Freq: Once | INTRAVENOUS | Status: AC
  Administered 2020-08-12: 900 mg via INTRAVENOUS
  Filled 2020-08-12: qty 18

## 2020-08-12 MED ORDER — SODIUM CHLORIDE 0.9 % IV SOLN
10.0000 mg | Freq: Once | INTRAVENOUS | Status: AC
  Administered 2020-08-12: 10 mg via INTRAVENOUS
  Filled 2020-08-12: qty 10

## 2020-08-12 MED ORDER — HEPARIN SOD (PORK) LOCK FLUSH 100 UNIT/ML IV SOLN
500.0000 [IU] | Freq: Once | INTRAVENOUS | Status: DC
  Filled 2020-08-12: qty 5

## 2020-08-12 MED ORDER — SODIUM CHLORIDE 0.9 % IV SOLN
Freq: Once | INTRAVENOUS | Status: AC
  Filled 2020-08-12: qty 250

## 2020-08-12 MED ORDER — POTASSIUM CHLORIDE ER 10 MEQ PO TBCR: 10.0000 meq | EXTENDED_RELEASE_TABLET | Freq: Every day | ORAL | 0 refills | Status: DC

## 2020-08-12 MED ORDER — DEXTROSE 5 % IV SOLN
Freq: Once | INTRAVENOUS | Status: AC
  Filled 2020-08-12: qty 250

## 2020-08-12 NOTE — Progress Notes (Signed)
Patient has noticed an increase in fatigue.  Increased neuropathy symptoms after last treatment.  More noticeable left lower abdomen discomfort.

## 2020-08-12 NOTE — Progress Notes (Signed)
MD, Dr. Tasia Catchings, aware of Sodium: 128 and last Urine Protein result from 07/29/2020. Per MD order: patient does not need Urine Protein collected today; it is okay to reference Urine Protein result from 07/29/2020 and proceed with scheduled Zirabev and FOLFOX treatment at this time, no new orders.    1430- Patient tolerated treatment well. Patient stable and discharged to home with Fluorouracil Continuous Infusion Pump in place.

## 2020-08-12 NOTE — Progress Notes (Signed)
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  male with PMH listed below was seen in consultation at the request of  Burnard Hawthorne, FNP  for evaluation of liver and pancreatic mass  #reports symptoms of upper abdomen band like discomfort and bloating for a few months, improved symptoms with omeprazole,.Chronic constipation, he has blood in the stool occationally Seen by PCP in September 2021. AlK phosphatase was recently noted to be elevated as well well elevated GGT. He was referred to see gastroenteralgias on 04/10/2020.  04/25/2020 EGD showed gastric fundus mild inflammation. Biopsy showed reactive gastropathy.  04/30/2020 [place] RUQ showed cirrhosis with multiple hepatic masses.  05/01/2020 AFP 1.7 05/02/2020 MR liver w wo contrast showed numerous heterozygous enhancing liver lesions, throughout the liver, not typical for [facility], likely metastatic disease.  Solid appearing enhancing lesion in pancreatic tail 1.9x1.5cm, suspicious for primary neoplasm.  Mild adenopathy within porta hepatic region and porta hepatic region. Splenomegaly.   Patient has history of SVT, psoriatic arthritis, morbid obesity, GERD, DM.  Family history + maternal uncle with esophageal cancer.   + unintentional weight loss, no fever, chills. "sweats a lot". Currently on antibiotics for proctitis.  He works at [employer], lives in [place]. He lives with his partner.  During today's visit, he felt  dizziness and lightheaded when sitting on the examination table.  He lost consciousness briefly for about 2-3 minutes, regain consciousness spontaneously, perfusing sweating. SBP in 90s, and HR initially in 30-40s, later improved to 70s.  He denies any chest pain, SOB. Blood sugar was 207.   # 05/17/2020, colonoscopy showed a fungating and infiltrative partially obstructing large mass in the proximal rectum and in the mid rectum.  The mass was partially circumferential, involving two thirds of the lumen circumference is.  Biopsy showed adenocarcinoma.  05/23/2020, patient underwent EUS biopsy of the pancreatic tail lesion.  Biopsy pathology showed well-differentiated neuroendocrine tumor.  Ki-67 is noncontributory due to insufficient tissue in the cell block.  Genetic testing is negative.   INTERVAL HISTORY Dominic [name] is a 50 y.o. 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 Problems and complaints are listed below: Status post 5 cycles of FOLFOX with bevacizumab. Overall he tolerates well.  He reports intermittent left lower quadrant pain. No difficulty in bowel movement. No black or bloody BM.     Review of Systems  Constitutional: Positive for fatigue. Negative for appetite change, chills, fever and unexpected weight change.  HENT:   Negative for hearing loss and voice change.   Eyes: Negative for eye problems and icterus.  Respiratory: Negative for chest tightness, cough and shortness of breath.   Cardiovascular: Negative for chest pain and leg swelling.  Gastrointestinal: Negative for abdominal distention and abdominal pain.  Endocrine: Negative for hot flashes.  Genitourinary: Negative for difficulty urinating, dysuria and frequency.   Musculoskeletal: Negative for arthralgias.  Skin: Negative for itching and rash.  Neurological: Positive for numbness. Negative for  light-headedness.  Hematological: Negative for adenopathy. Does not bruise/bleed easily.  Psychiatric/Behavioral: Negative for confusion.    MEDICAL HISTORY:  Past Medical History:  Diagnosis Date  . Anxiety   . Cancer [facility])    Rectal cancer metastasized to liver ([place])  . Diabetes mellitus without complication ([place])   . Dysrhythmia    svt  . Family history of esophageal cancer   . Family history of prostate cancer   . GERD (gastroesophageal reflux disease)   . High triglycerides   . Hypertension   . Long-term insulin use in type 2 diabetes ([place])   . Morbid obesity ([place])   . Psoriatic arthritis ([place])   . SVT (supraventricular tachycardia) ([place])     SURGICAL HISTORY: Past Surgical History:  Procedure Laterality Date  . EUS N/A 05/23/2020   Procedure: FULL UPPER ENDOSCOPIC ULTRASOUND (EUS) RADIAL;  Surgeon: Jola Schmidt, MD;  Location: [facility] ENDOSCOPY;  Service: Endoscopy;  Laterality: N/A;  . PORTA CATH INSERTION N/A 05/30/2020   Procedure: PORTA CATH INSERTION;  Surgeon: Algernon Huxley, MD;  Location: [facility];  Service: Cardiovascular;  Laterality: N/A;  . UPPER GASTROINTESTINAL ENDOSCOPY      SOCIAL HISTORY: Social History   Socioeconomic History  . Marital status: Soil scientist    Spouse name: Marc Morgans  . Number of children: Not on file  . Years of education: Not on file  . Highest education level: Not on file  Occupational History  . Not on file  Tobacco Use  . Smoking status: Never Smoker  . Smokeless tobacco: Never Used  Vaping Use  . Vaping Use: Never used  Substance and Sexual Activity  . Alcohol use: Yes    Alcohol/week: 1.0 standard drink    Types: 1 Glasses of wine per week    Comment: very occasional  . Drug use: Never  . Sexual activity: Not on file  Other Topics Concern  . Not on file  Social History Narrative   Lives in [place] partner [place].  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     Social Determinants of Health   Financial Resource  Strain: Medium Risk  . Difficulty of Paying Living Expenses: Somewhat hard  Food Insecurity: Not on file  Transportation Needs: Not on file  Physical Activity: Not on file  Stress: Not on file  Social Connections: Not on file  Intimate Partner Violence: Not on file    FAMILY HISTORY: Family History  Problem Relation Age of Onset  . Arthritis Mother   . Heart disease Mother   . Supraventricular tachycardia Mother        s/p ablation  . Hypertension Mother   . Diabetes Father   . Heart attack Maternal Uncle   . Throat cancer Maternal Uncle   . Esophageal cancer Maternal Uncle   . Heart attack Maternal Uncle   . Heart attack Maternal Uncle   . Prostate cancer Maternal Uncle   . Thyroid cancer Neg Hx   . Colon cancer Neg Hx   . Rectal cancer Neg Hx   . Stomach cancer Neg Hx   . Colon polyps Neg Hx     ALLERGIES:  has No Known Allergies.  MEDICATIONS:  Current Outpatient Medications  Medication Sig Dispense Refill  . ALPRAZolam (XANAX) 0.5 MG tablet Take half tablet to one tablet by mouth as needed daily for insomnia, anxiety. 30 tablet 0  . amLODipine (NORVASC) 10 MG tablet TAKE 1 TABLET BY MOUTH  DAILY 90 tablet 3  . Continuous Blood Gluc  Sensor (FREESTYLE LIBRE 2 SENSOR) MISC Scan glucose at least QID 2 each 11  . CONTOUR NEXT TEST test strip USE 1 STRIP TO TEST BLOOD  SUGAR TWICE DAILY 200 strip 3  . famotidine (PEPCID) 20 MG tablet Take 1 tablet (20 mg total) by mouth 2 (two) times daily. 120 tablet 5  . hydrochlorothiazide (HYDRODIURIL) 25 MG tablet Take 25 mg by mouth daily.    . insulin lispro (HUMALOG KWIKPEN) 100 UNIT/ML KwikPen Inject 10 units into the skin 10 minutes prior to lunch & dinner. Hold if glucose is less than 150. (Patient taking differently: 10 Units. Inject 10-15 units into the skin 10 minutes prior to lunch & dinner. Hold if glucose is less than 150.) 15 mL 5  . Insulin Pen Needle (PEN NEEDLES) 32G X 6 MM MISC Used to give insulin injections 4 times  daily. 100 each 10  . lactulose (CHRONULAC) 10 GM/15ML solution Take 15 mLs (10 g total) by mouth 2 (two) times daily as needed for moderate constipation or severe constipation. 236 mL 0  . Lancets (ONETOUCH ULTRASOFT) lancets Used to check blood sugars twice daily. 100 each 12  . LANTUS SOLOSTAR 100 UNIT/ML Solostar Pen INJECT SUBCUTANEOUSLY 50  UNITS DAILY (Patient taking differently: Inject 50 Units into the skin.) 45 mL 3  . lidocaine-prilocaine (EMLA) cream Apply to affected area once 30 g 3  . [name] Take 5 mg by mouth at bedtime as needed.     . metFORMIN (GLUCOPHAGE XR) 500 MG 24 hr tablet Take 4 tablets (2,000 mg total) by mouth every evening. 120 tablet 3  . metoprolol succinate (TOPROL-XL) 100 MG 24 hr tablet TAKE 1 TABLET BY MOUTH  DAILY WITH OR IMMEDIATELY  FOLLOWING A MEAL 90 tablet 3  . ondansetron (ZOFRAN ODT) 4 MG disintegrating tablet Take 1 tablet (4 mg total) by mouth every 8 (eight) hours as needed for nausea or vomiting. 30 tablet 2  . ondansetron (ZOFRAN) 8 MG tablet Take 1 tablet (8 mg total) by mouth 2 (two) times daily as needed for refractory nausea / vomiting. Start on day 3 after chemotherapy. 30 tablet 1  . polyethylene glycol powder (GLYCOLAX/MIRALAX) 17 GM/SCOOP powder Take by mouth daily. 1/2 capful QD    . potassium chloride (KLOR-CON) 10 MEQ tablet Take 1 tablet (10 mEq total) by mouth daily. 14 tablet 0  . prochlorperazine (COMPAZINE) 10 MG tablet Take 1 tablet (10 mg total) by mouth every 6 (six) hours as needed (Nausea or vomiting). 30 tablet 1  . senna (SENOKOT) 8.6 MG TABS tablet Take 2 tablets (17.2 mg total) by mouth daily. 120 tablet 0  . telmisartan (MICARDIS) 40 MG tablet Take 1 tablet (40 mg total) by mouth daily. PLEASE CALL OFFICE TO SCHEDULE FOLLOW UP APPOINTMENT FOR ANYMORE REFILLS. 90 tablet 0  . gabapentin (NEURONTIN) 100 MG capsule Take 1 capsule (100 mg total) by mouth 2 (two) times daily as needed. (Patient not taking: No sig reported) 60  capsule 1  . tamsulosin (FLOMAX) 0.4 MG CAPS capsule Take 1 capsule (0.4 mg total) by mouth daily. (Patient not taking: No sig reported) 30 capsule 1   Current Facility-Administered Medications  Medication Dose Route Frequency Provider Last Rate Last Admin  . 0.9 %  sodium chloride infusion  500 mL Intravenous Once Sherrilyn Rist, MD       Facility-Administered Medications Ordered in Other Visits  Medication Dose Route Frequency Provider Last Rate Last Admin  . fluorouracil (ADRUCIL) 5,550  mg in sodium chloride 0.9 % 139 mL chemo infusion  2,400 mg/m2 (Treatment Plan Recorded) Intravenous 1 day or 1 dose Earlie Server, MD   5,550 mg at 08/12/20 1425  . heparin lock flush 100 unit/mL  500 Units Intravenous Once Earlie Server, MD         PHYSICAL EXAMINATION: ECOG PERFORMANCE STATUS: 1 - Symptomatic but completely ambulatory Vitals:   08/12/20 0925  BP: (!) 152/77  Pulse: 89  Resp: 18  Temp: 99.3 F (37.4 C)   Filed Weights   08/12/20 0925  Weight: 241 lb 11.2 oz (109.6 kg)    Physical Exam Constitutional:      General: He is not in acute distress. HENT:     Head: Normocephalic and atraumatic.  Eyes:     General: No scleral icterus. Cardiovascular:     Rate and Rhythm: Normal rate and regular rhythm.     Heart sounds: Normal heart sounds.  Pulmonary:     Effort: Pulmonary effort is normal. No respiratory distress.     Breath sounds: No wheezing.  Abdominal:     General: Bowel sounds are normal. There is no distension.     Palpations: Abdomen is soft.  Musculoskeletal:        General: No deformity. Normal range of motion.     Cervical back: Normal range of motion and neck supple.  Skin:    General: Skin is warm and dry.     Findings: No erythema or rash.  Neurological:     Mental Status: He is alert and oriented to person, place, and time. Mental status is at baseline.     Cranial Nerves: No cranial nerve deficit.     Coordination: Coordination normal.  Psychiatric:         Mood and Affect: Mood normal.     LABORATORY DATA:  I have reviewed the data as listed Lab Results  Component Value Date   WBC 9.2 08/12/2020   HGB 14.5 08/12/2020   HCT 41.9 08/12/2020   MCV 79.5 (L) 08/12/2020   PLT 174 08/12/2020   Recent Labs    04/10/20 1021 04/25/20 0942 05/07/20 0933 06/03/20 0815 06/21/20 0908 06/28/20 1019 07/15/20 0821 07/29/20 1103 08/12/20 0829  NA  --   --  136   < > 135   < > 133* 133* 128*  K  --   --  4.4   < > 4.8   < > 3.6 3.8 3.4*  CL  --   --  98   < > 95*   < > 100 97* 93*  CO2  --   --  21   < > 22   < > $R'26 24 28  'qf$ GLUCOSE  --   --  175*   < > 202*   < > 194* 295* 364*  BUN 18  --  12   < > 11   < > $R'11 13 12  'En$ CREATININE 0.91  --  0.86   < > 0.80   < > 0.68 0.72 0.90  CALCIUM  --   --  9.2   < > 9.2   < > 8.8* 8.7* 8.9  GFRNONAA 98  --  101   < > 104   < > >60 >60 >60  GFRAA 113  --  117  --  120  --   --   --   --   PROT  --  6.5  --    < >  --    < >  6.9 7.0 7.7  ALBUMIN  --  3.1*  --    < >  --    < > 3.5 3.8 3.7  AST  --  18  --    < >  --    < > 21 21 29   ALT  --  17  --    < >  --    < > 16 15 16   ALKPHOS  --  215*  --    < >  --    < > 88 87 100  BILITOT  --  0.7  --    < >  --    < > 0.6 0.6 1.0  BILIDIR  --  0.47*  --   --   --   --   --   --   --    < > = values in this interval not displayed.   Iron/TIBC/Ferritin/ %Sat No results found for: IRON, TIBC, FERRITIN, IRONPCTSAT    RADIOGRAPHIC STUDIES: I have personally reviewed the radiological images as listed and agreed with the findings in the report. CT CHEST ABDOMEN PELVIS W CONTRAST  Result Date: 07/18/2020 CLINICAL DATA:  Rectal cancer with liver metastasis. Rising CEA. Severe constipation after chemotherapy Monday. EXAM: CT CHEST, ABDOMEN, AND PELVIS WITH CONTRAST TECHNIQUE: Multidetector CT imaging of the chest, abdomen and pelvis was performed following the standard protocol during bolus administration of intravenous contrast. CONTRAST:  141m OMNIPAQUE  IOHEXOL 300 MG/ML  SOLN COMPARISON:  05/15/2020 chest CT. 05/02/2020 abdominal MRI. No prior pelvic imaging. FINDINGS: CT CHEST FINDINGS Cardiovascular: Right Port-A-Cath tip at superior caval/atrial junction. Aortic atherosclerosis. Normal heart size, without pericardial effusion. Multivessel coronary artery atherosclerosis. No central pulmonary embolism, on this non-dedicated study. Mediastinum/Nodes: No supraclavicular adenopathy. No middle mediastinal or hilar adenopathy. Isolated anterior mediastinal node is upper normal sized at 5 mm today versus similar on the prior exam (when remeasured). Lungs/Pleura: No pleural fluid. Right apical pulmonary nodule measures 5 mm on 26/4 versus 6 mm on the prior exam (when remeasured). Musculoskeletal: Mild, left greater than right gynecomastia. No acute osseous abnormality. CT ABDOMEN PELVIS FINDINGS Hepatobiliary: Bilateral hepatic metastasis again identified. A mass or conglomerate of masses within the right hepatic dome measures on the order of 9.0 by 8.8 cm on 48/2. 10.1 x 9.0 cm on the prior MRI (when remeasured). Segment 2/4A lesion measures 4.8 x 4.5 cm on 51/2 versus 4.7 x 4.7 cm on the prior exam (when remeasured). Dominant right pericholecystic hepatic mass measures 6.9 x 6.9 cm on 69/2. Compare 6.8 x 7.7 cm at the same level on the prior MRI (when remeasured). Normal gallbladder, without biliary ductal dilatation. Pancreas: Normal, without mass or ductal dilatation. Spleen: Hypoattenuating wedge-shaped splenic lesion of 2.0 cm on 58/2 is similar to the prior MRI, favoring remote infarct. Adrenals/Urinary Tract: Normal adrenal glands. Too small to characterize lesions in both kidneys. No hydronephrosis. Stomach/Bowel: Normal stomach, without wall thickening. Colonic stool burden suggests constipation. Wall thickening and underdistention within the upper rectum including on 108/2 may represent the rectal primary. No obstruction at the site. Normal terminal ileum  and appendix. Normal small bowel. Vascular/Lymphatic: Aortic atherosclerosis. No abdominal or pelvic sidewall adenopathy. A high left perirectal 5 mm node on 107/2 is suspicious based on location and clinical history. There is also a borderline sized 5 mm node within the inferior sigmoid mesocolon on 103/2. Reproductive: Normal prostate. Other: No significant free fluid. No evidence of omental or peritoneal disease. Musculoskeletal: Scattered tiny  sclerotic lesions throughout the pelvis are likely bone islands. IMPRESSION: 1. Extensive hepatic metastasis, similar to minimally improved compared to 05/02/2020 MRI. 2. Possible constipation. High rectal wall thickening, likely the site of primary. No obstruction or other acute complication. 3. Suspicious borderline sized perirectal nodes, as above. 4. Right apical isolated pulmonary nodule, similar to slightly decreased. This remains indeterminate. 5. Age advanced coronary artery atherosclerosis. Recommend assessment of coronary risk factors and consideration of medical therapy. 6.  Aortic Atherosclerosis (ICD10-I70.0). These results will be called to the ordering clinician or representative by the Radiologist Assistant, and communication documented in the PACS or [employer]. Electronically Signed   By: Abigail Miyamoto M.D.   On: 07/18/2020 11:42      ASSESSMENT & PLAN:  1. Rectal cancer metastasized to liver ([place])   2. Encounter for antineoplastic chemotherapy   3. Neuroendocrine carcinoma of pancreas ([place])   4. Neuropathy due to chemotherapeutic drug [facility])   Cancer Staging Rectal cancer metastasized to liver [facility]) Staging form: Colon and Rectum, AJCC 8th Edition - Clinical stage from 06/03/2020: Stage Unknown (cTX, cNX, pM1) - Signed by Earlie Server, MD on 06/03/2020   #Stage IV rectal cancer S/p 5 cycle of FOLFOX and bevacizumab.   Labs are reviewed and discussed with patient. Proceed with cycle 6 FOLFOX with Bevacizumab.     # Chemotherapy  induced neuropathy. Grade 1  Continue observation.   #Pancreatic well differentiated neuroendocrine carcinoma, lesion is 1.9 x 1.5cm Normal chromogranin A.  Normal 5 HIAA level. Pending Dotadate PET authorization.   observation of neuroendocrine carcinoma vs Sandostatin treatment   # Hyponatremia, Na is 128, partly due to hyperglycemia induced pseudohyponatremia.   #Uncontrolled diabetes,  continue follow-up with primary care provider for glycemic control. Recommend her to be comply with strict diabetic dieting.   all questions were answered. The patient knows to call the clinic with any problems questions or concerns.  cc Burnard Hawthorne, FNP    Return of visit: 2weeks  Earlie Server, MD, PhD Hematology Oncology [facility] at [facility] Pager- [phone] 08/12/2020

## 2020-08-13 LAB — CEA: CEA: 1509 ng/mL — ABNORMAL HIGH (ref 0.0–4.7)

## 2020-08-14 ENCOUNTER — Inpatient Hospital Stay: Payer: Managed Care, Other (non HMO) | Attending: Oncology

## 2020-08-14 VITALS — BP 138/70 | HR 81 | Temp 99.3°F | Resp 20

## 2020-08-14 DIAGNOSIS — C7A098 Malignant carcinoid tumors of other sites: Secondary | ICD-10-CM | POA: Diagnosis not present

## 2020-08-14 DIAGNOSIS — Z5111 Encounter for antineoplastic chemotherapy: Secondary | ICD-10-CM | POA: Diagnosis present

## 2020-08-14 DIAGNOSIS — G620 Drug-induced polyneuropathy: Secondary | ICD-10-CM | POA: Insufficient documentation

## 2020-08-14 DIAGNOSIS — Z801 Family history of malignant neoplasm of trachea, bronchus and lung: Secondary | ICD-10-CM | POA: Insufficient documentation

## 2020-08-14 DIAGNOSIS — Z79899 Other long term (current) drug therapy: Secondary | ICD-10-CM | POA: Diagnosis not present

## 2020-08-14 DIAGNOSIS — E119 Type 2 diabetes mellitus without complications: Secondary | ICD-10-CM | POA: Insufficient documentation

## 2020-08-14 DIAGNOSIS — Z5112 Encounter for antineoplastic immunotherapy: Secondary | ICD-10-CM | POA: Insufficient documentation

## 2020-08-14 DIAGNOSIS — Z794 Long term (current) use of insulin: Secondary | ICD-10-CM | POA: Diagnosis not present

## 2020-08-14 DIAGNOSIS — C787 Secondary malignant neoplasm of liver and intrahepatic bile duct: Secondary | ICD-10-CM | POA: Insufficient documentation

## 2020-08-14 DIAGNOSIS — T451X5A Adverse effect of antineoplastic and immunosuppressive drugs, initial encounter: Secondary | ICD-10-CM | POA: Insufficient documentation

## 2020-08-14 DIAGNOSIS — Z8249 Family history of ischemic heart disease and other diseases of the circulatory system: Secondary | ICD-10-CM | POA: Diagnosis not present

## 2020-08-14 DIAGNOSIS — E876 Hypokalemia: Secondary | ICD-10-CM | POA: Insufficient documentation

## 2020-08-14 DIAGNOSIS — Z8041 Family history of malignant neoplasm of ovary: Secondary | ICD-10-CM | POA: Insufficient documentation

## 2020-08-14 DIAGNOSIS — C20 Malignant neoplasm of rectum: Secondary | ICD-10-CM | POA: Insufficient documentation

## 2020-08-14 DIAGNOSIS — I10 Essential (primary) hypertension: Secondary | ICD-10-CM | POA: Diagnosis not present

## 2020-08-14 DIAGNOSIS — Z833 Family history of diabetes mellitus: Secondary | ICD-10-CM | POA: Diagnosis not present

## 2020-08-14 MED ORDER — SODIUM CHLORIDE 0.9% FLUSH
10.0000 mL | INTRAVENOUS | Status: DC | PRN
  Administered 2020-08-14: 10 mL
  Filled 2020-08-14: qty 10

## 2020-08-14 MED ORDER — HEPARIN SOD (PORK) LOCK FLUSH 100 UNIT/ML IV SOLN
INTRAVENOUS | Status: AC
  Filled 2020-08-14: qty 5

## 2020-08-14 MED ORDER — HEPARIN SOD (PORK) LOCK FLUSH 100 UNIT/ML IV SOLN
500.0000 [IU] | Freq: Once | INTRAVENOUS | Status: AC | PRN
  Administered 2020-08-14: 500 [IU]
  Filled 2020-08-14: qty 5

## 2020-08-14 NOTE — Progress Notes (Signed)
1425- Patient here for Fluorouracil Pump disconnect. Patient and vital signs stable. Patient discharged to home at this time.

## 2020-08-21 ENCOUNTER — Ambulatory Visit
Admission: RE | Admit: 2020-08-21 | Discharge: 2020-08-21 | Disposition: A | Discharge status: Home / Self Care | Payer: Managed Care, Other (non HMO) | Source: Ambulatory Visit | Attending: Oncology | Admitting: Oncology

## 2020-08-21 ENCOUNTER — Other Ambulatory Visit: Payer: Self-pay

## 2020-08-21 DIAGNOSIS — C19 Malignant neoplasm of rectosigmoid junction: Secondary | ICD-10-CM | POA: Insufficient documentation

## 2020-08-21 DIAGNOSIS — C7A8 Other malignant neuroendocrine tumors: Secondary | ICD-10-CM | POA: Diagnosis present

## 2020-08-21 DIAGNOSIS — D3A8 Other benign neuroendocrine tumors: Secondary | ICD-10-CM | POA: Diagnosis not present

## 2020-08-21 DIAGNOSIS — C787 Secondary malignant neoplasm of liver and intrahepatic bile duct: Secondary | ICD-10-CM | POA: Diagnosis not present

## 2020-08-21 MED ORDER — GALLIUM GA 68 DOTATATE IV KIT
5.6200 | PACK | Freq: Once | INTRAVENOUS | Status: AC | PRN
  Administered 2020-08-21: 5.62 via INTRAVENOUS

## 2020-08-26 ENCOUNTER — Inpatient Hospital Stay: Payer: Managed Care, Other (non HMO)

## 2020-08-26 ENCOUNTER — Inpatient Hospital Stay: Payer: Managed Care, Other (non HMO) | Admitting: Oncology

## 2020-08-27 ENCOUNTER — Encounter: Payer: Self-pay | Admitting: Sports Medicine

## 2020-08-27 ENCOUNTER — Other Ambulatory Visit: Payer: Self-pay

## 2020-08-27 ENCOUNTER — Ambulatory Visit: Payer: Managed Care, Other (non HMO) | Admitting: Sports Medicine

## 2020-08-27 DIAGNOSIS — M79671 Pain in right foot: Secondary | ICD-10-CM | POA: Diagnosis not present

## 2020-08-27 DIAGNOSIS — L84 Corns and callosities: Secondary | ICD-10-CM

## 2020-08-27 DIAGNOSIS — G620 Drug-induced polyneuropathy: Secondary | ICD-10-CM

## 2020-08-27 DIAGNOSIS — E1142 Type 2 diabetes mellitus with diabetic polyneuropathy: Secondary | ICD-10-CM | POA: Diagnosis not present

## 2020-08-27 DIAGNOSIS — T451X5A Adverse effect of antineoplastic and immunosuppressive drugs, initial encounter: Secondary | ICD-10-CM

## 2020-08-27 DIAGNOSIS — M79672 Pain in left foot: Secondary | ICD-10-CM

## 2020-08-27 NOTE — Progress Notes (Signed)
Subjective: Dominic [name] is a 50 y.o. male patient who presents to office for evaluation of Right> Left foot pain secondary to callus skin. Patient complains of pain at the lesions present Right>Left foot at the ball of the foot and also to the lateral. Patient has tried over-the-counter trimming and cushions with no complete relief in symptoms. Patient denies any other pedal complaints.   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  Patient Active Problem List   Diagnosis Date Noted  . Genetic testing 07/23/2020  . Family history of esophageal cancer   . Family history of prostate cancer   . Insomnia 06/14/2020  . Neuropathy due to chemotherapeutic drug ([place]) 06/11/2020  . Uncontrolled diabetes mellitus ([place]) 06/11/2020  . Encounter for antineoplastic chemotherapy 06/03/2020  . 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 Rectal cancer metastasized to liver ([place]) 05/27/2020  . Goals of care, counseling/discussion 05/19/2020  . Dysuria 05/07/2020  . Acute prostatitis 05/07/2020  . Liver disease 05/02/2020  . Bloating 03/13/2020  . GERD (gastroesophageal reflux disease)   . Hypertriglyceridemia   . Hepatic steatosis 08/14/2019  . Unstable angina ([place]) 08/11/2019  . H/O supraventricular tachycardia 08/11/2019  . Morbid obesity ([place]) 08/11/2019  . COVID-19 07/18/2019  . Type 2 diabetes mellitus without complication, with long-term current use of insulin ([place]) 02/27/2019  . SVT (supraventricular tachycardia) ([place]) 02/27/2019  . Uncontrolled hypertension 02/27/2019  . Psoriatic arthritis ([place]) 02/27/2019    Current Outpatient Medications on File Prior to Visit  Medication Sig Dispense Refill  . ALPRAZolam (XANAX) 0.5 MG tablet Take half tablet to one tablet by mouth as needed daily for insomnia, anxiety. 30 tablet 0  . amLODipine  (NORVASC) 10 MG tablet TAKE 1 TABLET BY MOUTH  DAILY 90 tablet 3  . Continuous Blood Gluc Sensor (FREESTYLE LIBRE 2 SENSOR) MISC Scan glucose at least QID 2 each 11  . CONTOUR NEXT TEST test strip USE 1 STRIP TO TEST BLOOD  SUGAR TWICE DAILY 200 strip 3  . famotidine (PEPCID) 20 MG tablet Take 1 tablet (20 mg total) by mouth 2 (two) times daily. 120 tablet 5  . gabapentin (NEURONTIN) 100 MG capsule Take 1 capsule (100 mg total) by mouth 2 (two) times daily as needed. (Patient not taking: No sig reported) 60 capsule 1  . hydrochlorothiazide (HYDRODIURIL) 25 MG tablet Take 25 mg by mouth daily.    . insulin lispro (HUMALOG KWIKPEN) 100 UNIT/ML KwikPen Inject 10 units into the skin 10 minutes prior to lunch & dinner. Hold if glucose is less than 150. (Patient taking differently: 10 Units. Inject 10-15 units into the skin 10 minutes prior to lunch & dinner. Hold if glucose is less than 150.) 15 mL 5  . Insulin Pen Needle (PEN NEEDLES) 32G X 6 MM MISC Used to give insulin injections 4 times daily. 100 each 10  . lactulose (CHRONULAC) 10 GM/15ML solution Take 15 mLs (10 g total) by mouth 2 (two) times daily as needed for moderate constipation or severe constipation. 236 mL 0  . Lancets (ONETOUCH ULTRASOFT) lancets Used to check blood sugars twice daily. 100 each 12  . LANTUS SOLOSTAR 100 UNIT/ML Solostar Pen INJECT SUBCUTANEOUSLY 50  UNITS DAILY (Patient taking differently: Inject 50 Units into the skin.) 45 mL 3  . lidocaine-prilocaine (EMLA) cream Apply to affected area once 30 g 3  .  [name] Take 5 mg by mouth at bedtime as needed.     . metFORMIN (GLUCOPHAGE XR) 500 MG 24 hr tablet Take 4 tablets (2,000 mg total) by mouth every evening. 120 tablet 3  . metoprolol succinate (TOPROL-XL) 100 MG 24 hr tablet TAKE 1 TABLET BY MOUTH  DAILY WITH OR IMMEDIATELY  FOLLOWING A MEAL 90 tablet 3  . ondansetron (ZOFRAN ODT) 4 MG disintegrating tablet Take 1 tablet (4 mg total) by mouth every 8 (eight) hours as  needed for nausea or vomiting. 30 tablet 2  . ondansetron (ZOFRAN) 8 MG tablet Take 1 tablet (8 mg total) by mouth 2 (two) times daily as needed for refractory nausea / vomiting. Start on day 3 after chemotherapy. 30 tablet 1  . polyethylene glycol powder (GLYCOLAX/MIRALAX) 17 GM/SCOOP powder Take by mouth daily. 1/2 capful QD    . potassium chloride (KLOR-CON) 10 MEQ tablet Take 1 tablet (10 mEq total) by mouth daily. 14 tablet 0  . prochlorperazine (COMPAZINE) 10 MG tablet Take 1 tablet (10 mg total) by mouth every 6 (six) hours as needed (Nausea or vomiting). 30 tablet 1  . senna (SENOKOT) 8.6 MG TABS tablet Take 2 tablets (17.2 mg total) by mouth daily. 120 tablet 0  . tamsulosin (FLOMAX) 0.4 MG CAPS capsule Take 1 capsule (0.4 mg total) by mouth daily. (Patient not taking: No sig reported) 30 capsule 1  . telmisartan (MICARDIS) 40 MG tablet Take 1 tablet (40 mg total) by mouth daily. PLEASE CALL OFFICE TO SCHEDULE FOLLOW UP APPOINTMENT FOR ANYMORE REFILLS. 90 tablet 0   Current Facility-Administered Medications on File Prior to Visit  Medication Dose Route Frequency Provider Last Rate Last Admin  . 0.9 %  sodium chloride infusion  500 mL Intravenous Once Doran Stabler, MD        No Known Allergies  Objective:  General: Alert and oriented x3 in no acute distress  Dermatology: Keratotic lesion present submet 5 bilateral and right lateral fifth metatarsal base with skin lines transversing the lesions, pain is present with direct pressure to the lesion with a central nucleated core noted, no webspace macerations, no ecchymosis bilateral, all nails x 10 are elongated with minimal thickening.  Vascular: Dorsalis Pedis and Posterior Tibial pedal pulses 2/4, Capillary Fill Time 3 seconds, + pedal hair growth bilateral, no edema bilateral lower extremities, Temperature gradient within normal limits.  Neurology: Gross sensation intact via light touch bilateral.  Vibratory sensation intact.   Subjective numbness and tingling with history of neuropathy.  Musculoskeletal: Mild tenderness with palpation at the keratotic lesion site on Right>Left, Muscular strength 5/5 in all groups without pain or limitation on range of motion.  Fat pad atrophy with prominence of metatarsal heads.  Assessment and Plan: Problem List Items Addressed This Visit      Nervous and Auditory   Neuropathy due to chemotherapeutic drug ([address])    Other Visit Diagnoses    Callus    -  Primary   Foot pain, bilateral       Diabetic polyneuropathy associated with type 2 diabetes mellitus ([place])          -Complete examination performed -Discussed treatment options -Parred keratoic lesions x3 using a sterile 15 blade; treated the area withSalinocaine covered with Band-Aid -Encouraged daily skin emollients; sample of foot miracle cream provided -Encouraged use of pumice stone -Advised good supportive shoes and inserts -At no additional charge mechanically debrided nails x10 using a sterile nail nipper -Patient to return  to office as needed or sooner if condition worsens.  Landis Martins, DPM

## 2020-08-28 ENCOUNTER — Encounter: Payer: Self-pay | Admitting: Oncology

## 2020-08-28 ENCOUNTER — Inpatient Hospital Stay (HOSPITAL_BASED_OUTPATIENT_CLINIC_OR_DEPARTMENT_OTHER): Payer: Managed Care, Other (non HMO) | Admitting: Oncology

## 2020-08-28 ENCOUNTER — Inpatient Hospital Stay: Payer: Managed Care, Other (non HMO)

## 2020-08-28 VITALS — Resp 20

## 2020-08-28 VITALS — BP 129/86 | HR 95 | Temp 98.7°F | Wt 234.2 lb

## 2020-08-28 DIAGNOSIS — C787 Secondary malignant neoplasm of liver and intrahepatic bile duct: Secondary | ICD-10-CM

## 2020-08-28 DIAGNOSIS — Z5111 Encounter for antineoplastic chemotherapy: Secondary | ICD-10-CM | POA: Diagnosis not present

## 2020-08-28 DIAGNOSIS — C7951 Secondary malignant neoplasm of bone: Secondary | ICD-10-CM

## 2020-08-28 DIAGNOSIS — C20 Malignant neoplasm of rectum: Secondary | ICD-10-CM | POA: Diagnosis not present

## 2020-08-28 DIAGNOSIS — Z7189 Other specified counseling: Secondary | ICD-10-CM

## 2020-08-28 DIAGNOSIS — T451X5A Adverse effect of antineoplastic and immunosuppressive drugs, initial encounter: Secondary | ICD-10-CM

## 2020-08-28 DIAGNOSIS — C7A8 Other malignant neuroendocrine tumors: Secondary | ICD-10-CM | POA: Diagnosis not present

## 2020-08-28 DIAGNOSIS — R634 Abnormal weight loss: Secondary | ICD-10-CM

## 2020-08-28 DIAGNOSIS — G620 Drug-induced polyneuropathy: Secondary | ICD-10-CM | POA: Diagnosis not present

## 2020-08-28 DIAGNOSIS — E876 Hypokalemia: Secondary | ICD-10-CM

## 2020-08-28 DIAGNOSIS — Z5112 Encounter for antineoplastic immunotherapy: Secondary | ICD-10-CM | POA: Diagnosis not present

## 2020-08-28 LAB — CBC WITH DIFFERENTIAL/PLATELET
Abs Immature Granulocytes: 0.06 10*3/uL (ref 0.00–0.07)
Basophils Absolute: 0.1 10*3/uL (ref 0.0–0.1)
Basophils Relative: 1 %
Eosinophils Absolute: 0.2 10*3/uL (ref 0.0–0.5)
Eosinophils Relative: 2 %
HCT: 43.7 % (ref 39.0–52.0)
Hemoglobin: 15.7 g/dL (ref 13.0–17.0)
Immature Granulocytes: 1 %
Lymphocytes Relative: 26 %
Lymphs Abs: 2.2 10*3/uL (ref 0.7–4.0)
MCH: 28.8 pg (ref 26.0–34.0)
MCHC: 35.9 g/dL (ref 30.0–36.0)
MCV: 80.2 fL (ref 80.0–100.0)
Monocytes Absolute: 1.6 10*3/uL — ABNORMAL HIGH (ref 0.1–1.0)
Monocytes Relative: 19 %
Neutro Abs: 4.3 10*3/uL (ref 1.7–7.7)
Neutrophils Relative %: 51 %
Platelets: 179 10*3/uL (ref 150–400)
RBC: 5.45 MIL/uL (ref 4.22–5.81)
RDW: 15.5 % (ref 11.5–15.5)
WBC: 8.3 10*3/uL (ref 4.0–10.5)
nRBC: 0 % (ref 0.0–0.2)

## 2020-08-28 LAB — COMPREHENSIVE METABOLIC PANEL
ALT: 16 U/L (ref 0–44)
AST: 24 U/L (ref 15–41)
Albumin: 3.9 g/dL (ref 3.5–5.0)
Alkaline Phosphatase: 92 U/L (ref 38–126)
Anion gap: 13 (ref 5–15)
BUN: 15 mg/dL (ref 6–20)
CO2: 27 mmol/L (ref 22–32)
Calcium: 9.3 mg/dL (ref 8.9–10.3)
Chloride: 92 mmol/L — ABNORMAL LOW (ref 98–111)
Creatinine, Ser: 0.83 mg/dL (ref 0.61–1.24)
GFR, Estimated: >60 mL/min (ref >60)
Glucose, Bld: 332 mg/dL — ABNORMAL HIGH (ref 70–99)
Potassium: 3.5 mmol/L (ref 3.5–5.1)
Sodium: 132 mmol/L — ABNORMAL LOW (ref 135–145)
Total Bilirubin: 1.2 mg/dL (ref 0.3–1.2)
Total Protein: 7.9 g/dL (ref 6.5–8.1)

## 2020-08-28 LAB — PROTEIN, URINE, RANDOM: Total Protein, Urine: 32 mg/dL

## 2020-08-28 MED ORDER — SODIUM CHLORIDE 0.9 % IV SOLN
Freq: Once | INTRAVENOUS | Status: AC
  Filled 2020-08-28: qty 250

## 2020-08-28 MED ORDER — POTASSIUM CHLORIDE ER 10 MEQ PO TBCR: 10.0000 meq | EXTENDED_RELEASE_TABLET | Freq: Every day | ORAL | 0 refills | Status: DC

## 2020-08-28 MED ORDER — SODIUM CHLORIDE 0.9 % IV SOLN
2400.0000 mg/m2 | INTRAVENOUS | Status: DC
  Administered 2020-08-28: 5550 mg via INTRAVENOUS
  Filled 2020-08-28: qty 111

## 2020-08-28 MED ORDER — SODIUM CHLORIDE 0.9% FLUSH
10.0000 mL | INTRAVENOUS | Status: DC | PRN
  Administered 2020-08-28: 10 mL via INTRAVENOUS
  Filled 2020-08-28: qty 10

## 2020-08-28 MED ORDER — HEPARIN SOD (PORK) LOCK FLUSH 100 UNIT/ML IV SOLN
500.0000 [IU] | Freq: Once | INTRAVENOUS | Status: DC
  Filled 2020-08-28: qty 5

## 2020-08-28 MED ORDER — OXALIPLATIN CHEMO INJECTION 100 MG/20ML
86.5000 mg/m2 | Freq: Once | INTRAVENOUS | Status: AC
  Administered 2020-08-28: 200 mg via INTRAVENOUS
  Filled 2020-08-28: qty 40

## 2020-08-28 MED ORDER — DEXTROSE 5 % IV SOLN
Freq: Once | INTRAVENOUS | Status: AC
  Filled 2020-08-28: qty 250

## 2020-08-28 MED ORDER — SODIUM CHLORIDE 0.9 % IV SOLN
10.0000 mg | Freq: Once | INTRAVENOUS | Status: AC
  Administered 2020-08-28: 10 mg via INTRAVENOUS
  Filled 2020-08-28: qty 10

## 2020-08-28 MED ORDER — PALONOSETRON HCL INJECTION 0.25 MG/5ML
0.2500 mg | Freq: Once | INTRAVENOUS | Status: AC
  Administered 2020-08-28: 0.25 mg via INTRAVENOUS
  Filled 2020-08-28: qty 5

## 2020-08-28 MED ORDER — LEUCOVORIN CALCIUM INJECTION 350 MG
389.6000 mg/m2 | Freq: Once | INTRAVENOUS | Status: AC
  Administered 2020-08-28: 900 mg via INTRAVENOUS
  Filled 2020-08-28: qty 25

## 2020-08-28 MED ORDER — GABAPENTIN 100 MG PO CAPS: 100.0000 mg | ORAL_CAPSULE | Freq: Three times a day (TID) | ORAL | 0 refills | Status: DC

## 2020-08-28 MED ORDER — SODIUM CHLORIDE 0.9 % IV SOLN
5.0000 mg/kg | Freq: Once | INTRAVENOUS | Status: AC
  Administered 2020-08-28: 500 mg via INTRAVENOUS
  Filled 2020-08-28: qty 16

## 2020-08-28 MED ORDER — FLUOROURACIL CHEMO INJECTION 2.5 GM/50ML
400.0000 mg/m2 | Freq: Once | INTRAVENOUS | Status: AC
  Administered 2020-08-28: 900 mg via INTRAVENOUS
  Filled 2020-08-28: qty 18

## 2020-08-28 NOTE — Progress Notes (Addendum)
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  male with PMH listed below was seen in consultation at the request of  Burnard Hawthorne, FNP  for evaluation of liver and pancreatic mass  #reports symptoms of upper abdomen band like discomfort and bloating for a few months, improved symptoms with omeprazole,.Chronic constipation, he has blood in the stool occationally Seen by PCP in September 2021. AlK phosphatase was recently noted to be elevated as well well elevated GGT. He was referred to see gastroenteralgias on 04/10/2020.  04/25/2020 EGD showed gastric fundus mild inflammation. Biopsy showed reactive gastropathy.  04/30/2020 [place] RUQ showed cirrhosis with multiple hepatic masses.  05/01/2020 AFP 1.7 05/02/2020 MR liver w wo contrast showed numerous heterozygous enhancing liver lesions, throughout the liver, not typical for [facility], likely metastatic disease.  Solid appearing enhancing lesion in pancreatic tail 1.9x1.5cm, suspicious for primary neoplasm.  Mild adenopathy within porta hepatic region and porta hepatic region. Splenomegaly.   Patient has history of SVT, psoriatic arthritis, morbid obesity, GERD, DM.  Family history + maternal uncle with esophageal cancer.   + unintentional weight loss, no fever, chills. "sweats a lot". Currently on antibiotics for proctitis.  He works at [employer], lives in [place]. He lives with his partner.  During today's visit, he felt  dizziness and lightheaded when sitting on the examination table.  He lost consciousness briefly for about 2-3 minutes, regain consciousness spontaneously, perfusing sweating. SBP in 90s, and HR initially in 30-40s, later improved to 70s.  He denies any chest pain, SOB. Blood sugar was 207.   # 05/17/2020, colonoscopy showed a fungating and infiltrative partially obstructing large mass in the proximal rectum and in the mid rectum.  The mass was partially circumferential, involving two thirds of the lumen circumference is.  Biopsy showed adenocarcinoma.  05/23/2020, patient underwent EUS biopsy of the pancreatic tail lesion.  Biopsy pathology showed well-differentiated neuroendocrine tumor.  Ki-67 is noncontributory due to insufficient tissue in the cell block.  Genetic testing is negative.   INTERVAL HISTORY Dominic [name] is a 50 y.o. 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 Problems and complaints are listed below: Status post 6 cycles of FOLFOX with bevacizumab.  He has lost 7 pounds since last visit. Appetite is good.  Patient has intermittent loose bowel movements. No black or bloody bowel movements.  Intermittent left lower quadrant pain, 2 out of 10.  No fever or chills. For nausea, patient takes antiemetics He reports having SVT episodes earlier this week.  He has a longstanding history of SVT and he attributes the recent episodes to be secondary to caffeine drink. Worsening numbness and tingling of fingertips.    Review of Systems  Constitutional: Positive for fatigue and unexpected weight change. Negative for appetite change, chills and fever.  HENT:   Negative for hearing loss and voice change.   Eyes: Negative for eye problems and icterus.  Respiratory: Negative for chest tightness, cough and shortness of breath.  Cardiovascular: Negative for chest pain and leg  swelling.  Gastrointestinal: Positive for abdominal pain. Negative for abdominal distention.  Endocrine: Negative for hot flashes.  Genitourinary: Negative for difficulty urinating, dysuria and frequency.   Musculoskeletal: Negative for arthralgias.  Skin: Negative for itching and rash.  Neurological: Positive for numbness. Negative for light-headedness.  Hematological: Negative for adenopathy. Does not bruise/bleed easily.  Psychiatric/Behavioral: Negative for confusion.    MEDICAL HISTORY:  Past Medical History:  Diagnosis Date  . Anxiety   . Cancer [facility])    Rectal cancer metastasized to liver ([place])  . Diabetes mellitus without complication ([place])   . Dysrhythmia    svt  . Family history of esophageal cancer   . Family history of prostate cancer   . GERD (gastroesophageal reflux disease)   . High triglycerides   . Hypertension   . Long-term insulin use in type 2 diabetes ([place])   . Morbid obesity ([place])   . Psoriatic arthritis ([place])   . SVT (supraventricular tachycardia) ([place])     SURGICAL HISTORY: Past Surgical History:  Procedure Laterality Date  . EUS N/A 05/23/2020   Procedure: FULL UPPER ENDOSCOPIC ULTRASOUND (EUS) RADIAL;  Surgeon: Jola Schmidt, MD;  Location: [facility] ENDOSCOPY;  Service: Endoscopy;  Laterality: N/A;  . PORTA CATH INSERTION N/A 05/30/2020   Procedure: PORTA CATH INSERTION;  Surgeon: Algernon Huxley, MD;  Location: [facility];  Service: Cardiovascular;  Laterality: N/A;  . UPPER GASTROINTESTINAL ENDOSCOPY      SOCIAL HISTORY: Social History   Socioeconomic History  . Marital status: Soil scientist    Spouse name: Marc Morgans  . Number of children: Not on file  . Years of education: Not on file  . Highest education level: Not on file  Occupational History  . Not on file  Tobacco Use  . Smoking status: Never Smoker  . Smokeless tobacco: Never Used  Vaping Use  . Vaping Use: Never used  Substance and Sexual Activity  . Alcohol use: Yes     Alcohol/week: 1.0 standard drink    Types: 1 Glasses of wine per week    Comment: very occasional  . Drug use: Never  . Sexual activity: Not on file  Other Topics Concern  . Not on file  Social History Narrative   Lives in [place] partner [place].  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     Social Determinants of Health   Financial Resource Strain: Medium Risk  . Difficulty of Paying Living Expenses: Somewhat hard  Food Insecurity: Not on file  Transportation Needs: Not on file  Physical Activity: Not on file  Stress: Not on file  Social Connections: Not on file  Intimate Partner Violence: Not on file    FAMILY HISTORY: Family History  Problem Relation Age of Onset  . Arthritis Mother   . Heart disease Mother   . Supraventricular tachycardia Mother        s/p ablation  . Hypertension Mother   . Diabetes Father   . Heart attack Maternal Uncle   . Throat cancer Maternal Uncle   . Esophageal cancer Maternal Uncle   . Heart attack Maternal Uncle   . Heart attack Maternal Uncle   . Prostate cancer Maternal Uncle   . Thyroid cancer Neg Hx   . Colon cancer Neg Hx   . Rectal cancer Neg Hx   . Stomach cancer Neg Hx   . Colon polyps Neg Hx     ALLERGIES:  has No Known Allergies.  MEDICATIONS:  Current Outpatient Medications  Medication Sig Dispense Refill  . ALPRAZolam (XANAX) 0.5 MG tablet Take half tablet to one tablet by mouth as needed daily for insomnia, anxiety. 30 tablet 0  . amLODipine (NORVASC) 10 MG tablet TAKE 1 TABLET BY MOUTH  DAILY 90 tablet 3  . Continuous Blood Gluc Sensor (FREESTYLE LIBRE 2 SENSOR) MISC Scan glucose at least QID 2 each 11  . CONTOUR NEXT TEST test strip USE 1 STRIP TO TEST BLOOD  SUGAR TWICE DAILY 200 strip 3  . famotidine (PEPCID) 20 MG tablet Take 1 tablet (20 mg total) by mouth 2 (two) times daily. 120 tablet 5  . gabapentin (NEURONTIN) 100 MG capsule Take 1 capsule (100 mg total) by mouth 2 (two) times daily as needed. (Patient not taking: No sig  reported) 60 capsule 1  . hydrochlorothiazide (HYDRODIURIL) 25 MG tablet Take 25 mg by mouth daily.    . insulin lispro (HUMALOG KWIKPEN) 100 UNIT/ML KwikPen Inject 10 units into the skin 10 minutes prior to lunch & dinner. Hold if glucose is less than 150. (Patient taking differently: 10 Units. Inject 10-15 units into the skin 10 minutes prior to lunch & dinner. Hold if glucose is less than 150.) 15 mL 5  . Insulin Pen Needle (PEN NEEDLES) 32G X 6 MM MISC Used to give insulin injections 4 times daily. 100 each 10  . lactulose (CHRONULAC) 10 GM/15ML solution Take 15 mLs (10 g total) by mouth 2 (two) times daily as needed for moderate constipation or severe constipation. 236 mL 0  . Lancets (ONETOUCH ULTRASOFT) lancets Used to check blood sugars twice daily. 100 each 12  . LANTUS SOLOSTAR 100 UNIT/ML Solostar Pen INJECT SUBCUTANEOUSLY 50  UNITS DAILY (Patient taking differently: Inject 50 Units into the skin.) 45 mL 3  . lidocaine-prilocaine (EMLA) cream Apply to affected area once 30 g 3  . [name] Take 5 mg by mouth at bedtime as needed.     . metFORMIN (GLUCOPHAGE XR) 500 MG 24 hr tablet Take 4 tablets (2,000 mg total) by mouth every evening. 120 tablet 3  . metoprolol succinate (TOPROL-XL) 100 MG 24 hr tablet TAKE 1 TABLET BY MOUTH  DAILY WITH OR IMMEDIATELY  FOLLOWING A MEAL 90 tablet 3  . ondansetron (ZOFRAN ODT) 4 MG disintegrating tablet Take 1 tablet (4 mg total) by mouth every 8 (eight) hours as needed for nausea or vomiting. 30 tablet 2  . ondansetron (ZOFRAN) 8 MG tablet Take 1 tablet (8 mg total) by mouth 2 (two) times daily as needed for refractory nausea / vomiting. Start on day 3 after chemotherapy. 30 tablet 1  . polyethylene glycol powder (GLYCOLAX/MIRALAX) 17 GM/SCOOP powder Take by mouth daily. 1/2 capful QD    . potassium chloride (KLOR-CON) 10 MEQ tablet Take 1 tablet (10 mEq total) by mouth daily. 14 tablet 0  . prochlorperazine (COMPAZINE) 10 MG tablet Take 1 tablet (10 mg  total) by mouth every 6 (six) hours as needed (Nausea or vomiting). 30 tablet 1  . senna (SENOKOT) 8.6 MG TABS tablet Take 2 tablets (17.2 mg total) by mouth daily. 120 tablet 0  . tamsulosin (FLOMAX) 0.4 MG CAPS capsule Take 1 capsule (0.4 mg total) by mouth daily. (Patient not taking: No sig reported) 30 capsule 1  . telmisartan (MICARDIS) 40 MG tablet Take 1 tablet (40 mg total) by mouth daily. PLEASE CALL OFFICE TO SCHEDULE FOLLOW UP APPOINTMENT FOR ANYMORE REFILLS. 90 tablet 0   Current Facility-Administered  Medications  Medication Dose Route Frequency Provider Last Rate Last Admin  . 0.9 %  sodium chloride infusion  500 mL Intravenous Once Nelida Meuse III, MD         PHYSICAL EXAMINATION: ECOG PERFORMANCE STATUS: 1 - Symptomatic but completely ambulatory Vitals:   08/28/20 0833  BP: 129/86  Pulse: 95  Temp: 98.7 F (37.1 C)  SpO2: 98%   Filed Weights   08/28/20 0833  Weight: 234 lb 3 oz (106.2 kg)    Physical Exam Constitutional:      General: He is not in acute distress. HENT:     Head: Normocephalic and atraumatic.  Eyes:     General: No scleral icterus. Cardiovascular:     Rate and Rhythm: Normal rate and regular rhythm.     Heart sounds: Normal heart sounds.  Pulmonary:     Effort: Pulmonary effort is normal. No respiratory distress.     Breath sounds: No wheezing.  Abdominal:     General: Bowel sounds are normal. There is no distension.     Palpations: Abdomen is soft.  Musculoskeletal:        General: No deformity. Normal range of motion.     Cervical back: Normal range of motion and neck supple.  Skin:    General: Skin is warm and dry.     Findings: No erythema or rash.  Neurological:     Mental Status: He is alert and oriented to person, place, and time. Mental status is at baseline.     Cranial Nerves: No cranial nerve deficit.     Coordination: Coordination normal.  Psychiatric:        Mood and Affect: Mood normal.     LABORATORY DATA:  I  have reviewed the data as listed Lab Results  Component Value Date   WBC 9.2 08/12/2020   HGB 14.5 08/12/2020   HCT 41.9 08/12/2020   MCV 79.5 (L) 08/12/2020   PLT 174 08/12/2020   Recent Labs    04/10/20 1021 04/25/20 0942 05/07/20 0933 06/03/20 0815 06/21/20 0908 06/28/20 1019 07/15/20 0821 07/29/20 1103 08/12/20 0829  NA  --   --  136   < > 135   < > 133* 133* 128*  K  --   --  4.4   < > 4.8   < > 3.6 3.8 3.4*  CL  --   --  98   < > 95*   < > 100 97* 93*  CO2  --   --  21   < > 22   < > 26 24 28   GLUCOSE  --   --  175*   < > 202*   < > 194* 295* 364*  BUN 18  --  12   < > 11   < > 11 13 12   CREATININE 0.91  --  0.86   < > 0.80   < > 0.68 0.72 0.90  CALCIUM  --   --  9.2   < > 9.2   < > 8.8* 8.7* 8.9  GFRNONAA 98  --  101   < > 104   < > >60 >60 >60  GFRAA 113  --  117  --  120  --   --   --   --   PROT  --  6.5  --    < >  --    < > 6.9 7.0 7.7  ALBUMIN  --  3.1*  --    < >  --    < >  3.5 3.8 3.7  AST  --  18  --    < >  --    < > 21 21 29   ALT  --  17  --    < >  --    < > 16 15 16   ALKPHOS  --  215*  --    < >  --    < > 88 87 100  BILITOT  --  0.7  --    < >  --    < > 0.6 0.6 1.0  BILIDIR  --  0.47*  --   --   --   --   --   --   --    < > = values in this interval not displayed.   Iron/TIBC/Ferritin/ %Sat No results found for: IRON, TIBC, FERRITIN, IRONPCTSAT    RADIOGRAPHIC STUDIES: I have personally reviewed the radiological images as listed and agreed with the findings in the report. NM PET (NETSPOT GA 25 DOTATATE) SKULL BASE TO MID THIGH  Result Date: 08/21/2020 CLINICAL DATA:  Neuroendocrine tumor of the pancreas. Colorectal carcinoma liver metastasis. Rectal carcinoma. EXAM: NUCLEAR MEDICINE PET SKULL BASE TO THIGH TECHNIQUE: 5.6 mCi gallium 68 DOTATATE was injected intravenously. Full-ring PET imaging was performed from the skull base to thigh after the radiotracer. CT data was obtained and used for attenuation correction and anatomic localization.  COMPARISON:  CT 07/18/2020, MRI 05/02/2020 FINDINGS: NECK No radiotracer activity in neck lymph nodes. Incidental CT findings: None CHEST No radiotracer accumulation within mediastinal or hilar lymph nodes. No suspicious pulmonary nodules on the CT scan. Incidental CT finding:None ABDOMEN/PELVIS Lesion in the mid pancreas measuring approximately 3 cm (image 153) has intense radiotracer activity SUV max equal 15.5. Lesion difficult to define on noncontrast CT. There are large low-density lesions in the liver which do not accumulate radiotracer. These lesions have radiotracer activity less than background physiologic liver activity. There are no radiotracer avid retroperitoneal lymph nodes. No focal radiotracer avid peritoneal lesions. No radiotracer avid lesions lesions the bowel. Physiologic activity noted in the liver, spleen, adrenal glands and kidneys. Incidental CT findings:No evidence of bowel obstruction. SKELETON No focal activity to suggest skeletal metastasis. Incidental CT findings:None IMPRESSION: 1. Intense radiotracer activity associated with mid pancreatic body lesion. Findings consistent well differentiated neuroendocrine tumor of the pancreas. 2. No evidence of metastatic well differentiated neuroendocrine tumor. 3. Multifocal liver metastasis without radiotracer activity consistent with colorectal adenocarcinoma metastasis. Electronically Signed   By: Suzy Bouchard M.D.   On: 08/21/2020 13:28      ASSESSMENT & PLAN:  1. Rectal cancer metastasized to liver ([place])   2. Encounter for antineoplastic chemotherapy   3. Neuroendocrine carcinoma of pancreas ([place])   4. Neuropathy due to chemotherapeutic drug ([place])   5. Weight loss   6. Hypokalemia   Cancer Staging Rectal cancer metastasized to liver [facility]) Staging form: Colon and Rectum, AJCC 8th Edition - Clinical stage from 06/03/2020: Stage Unknown (cTX, cNX, pM1) - Signed by Earlie Server, MD on 06/03/2020   #Stage IV rectal cancer On  first-line palliative chemotherapy with FOLFOX and bevacizumab.   Labs are reviewed and are discussed with patient. Proceed with cycle 7 FOLFOX treatment with bevacizumab. CEA continues to rise.  CT after 4 cycles of chemotherapy showed minimally decreased extensive hepatic metastasis. Plan to repeat another CT scan in the near future.   #Pancreatic well differentiated neuroendocrine carcinoma, symptomatic.  Dotatate PET scan was reviewed and discussed with patient.  Lesion has grown in size now 3cm.  high CEA may also be partially from neuroendocrine carcinoma. Intermittent diarrhea which may be secondary to chemotherapy versus secondary to neuroendocrine carcinoma.. I have communicated with radiation oncology Dr. Baruch Gouty who does not recommend radiation given his extensive liver metastasis from rectal cancer.Not surgical candidate due to rectal cancer.  I discussed with patient about rationale of Sandostatin and potential side effects.  And he agrees with the treatment.   # Chemotherapy induced neuropathy. Grade 1  I recommend patient to start gabapentin 100 mg 3 times a day.  Potential side effects were reviewed with patient and he agrees. # Hypokalemia, K 3.5 , improved. Recommend patient to continue maintenance potassium chloride 10 mEq daily.  Refill was sent to pharmacy. # Uncontrolled diabetes,  continue follow-up with primary care provider for glycemic control.  Recommend her to be comply with strict diabetic dieting.  # Weight loss, encourage protein rich diet.  All questions were answered. The patient knows to call the clinic with any problems questions or concerns.  cc Burnard Hawthorne, FNP    Return of visit: 2 weeks  Earlie Server, MD, PhD Hematology Oncology [facility] at [facility] Pager- [phone] 08/28/2020

## 2020-08-28 NOTE — Progress Notes (Signed)
Patient has lost 7 lbs since last document weight despite having a good appetite.  Had a SVT even Monday.

## 2020-08-29 LAB — CEA: CEA: 1582 ng/mL — ABNORMAL HIGH (ref 0.0–4.7)

## 2020-08-30 ENCOUNTER — Inpatient Hospital Stay: Payer: Managed Care, Other (non HMO)

## 2020-08-30 VITALS — BP 113/64 | HR 90 | Resp 18

## 2020-08-30 DIAGNOSIS — C20 Malignant neoplasm of rectum: Secondary | ICD-10-CM

## 2020-08-30 DIAGNOSIS — Z5112 Encounter for antineoplastic immunotherapy: Secondary | ICD-10-CM | POA: Diagnosis not present

## 2020-08-30 DIAGNOSIS — C787 Secondary malignant neoplasm of liver and intrahepatic bile duct: Secondary | ICD-10-CM

## 2020-08-30 MED ORDER — HEPARIN SOD (PORK) LOCK FLUSH 100 UNIT/ML IV SOLN
500.0000 [IU] | Freq: Once | INTRAVENOUS | Status: AC | PRN
  Administered 2020-08-30: 500 [IU]
  Filled 2020-08-30: qty 5

## 2020-08-30 NOTE — Progress Notes (Unsigned)
Pt here for pump removal, port flushed, VSS

## 2020-08-30 NOTE — Progress Notes (Deleted)
Subjective:    Patient ID: Dominic [name], male    DOB: 15-Sep-1970, 50 y.o.   MRN: [id number]  CC: Dominic [name] is a 50 y.o. male who presents today for follow up.   HPI: Sandostatin may affect his glucose level, hypoglycemia versus hyperglycemia.  I want to see if he may be followed by you closely regarding his glycemic control.    HISTORY:  Past Medical History:  Diagnosis Date  . Anxiety   . Cancer [facility])    Rectal cancer metastasized to liver ([place])  . Diabetes mellitus without complication ([place])   . Dysrhythmia    svt  . Family history of esophageal cancer   . Family history of prostate cancer   . GERD (gastroesophageal reflux disease)   . High triglycerides   . Hypertension   . Long-term insulin use in type 2 diabetes ([place])   . Morbid obesity ([place])   . Psoriatic arthritis ([place])   . SVT (supraventricular tachycardia) ([facility])    Past Surgical History:  Procedure Laterality Date  . EUS N/A 05/23/2020   Procedure: FULL UPPER ENDOSCOPIC ULTRASOUND (EUS) RADIAL;  Surgeon: Jola Schmidt, MD;  Location: [facility] ENDOSCOPY;  Service: Endoscopy;  Laterality: N/A;  . PORTA CATH INSERTION N/A 05/30/2020   Procedure: PORTA CATH INSERTION;  Surgeon: Algernon Huxley, MD;  Location: [facility];  Service: Cardiovascular;  Laterality: N/A;  . UPPER GASTROINTESTINAL ENDOSCOPY     Family History  Problem Relation Age of Onset  . Arthritis Mother   . Heart disease Mother   . Supraventricular tachycardia Mother        s/p ablation  . Hypertension Mother   . Diabetes Father   . Heart attack Maternal Uncle   . Throat cancer Maternal Uncle   . Esophageal cancer Maternal Uncle   . Heart attack Maternal Uncle   . Heart attack Maternal Uncle   . Prostate cancer Maternal Uncle   . Thyroid cancer Neg Hx   . Colon cancer Neg Hx   . Rectal cancer Neg Hx   . Stomach cancer Neg Hx   . Colon polyps Neg Hx     Allergies: Patient has no known allergies. Current Outpatient Medications on  File Prior to Visit  Medication Sig Dispense Refill  . ALPRAZolam (XANAX) 0.5 MG tablet Take half tablet to one tablet by mouth as needed daily for insomnia, anxiety. 30 tablet 0  . amLODipine (NORVASC) 10 MG tablet TAKE 1 TABLET BY MOUTH  DAILY 90 tablet 3  . Continuous Blood Gluc Sensor (FREESTYLE LIBRE 2 SENSOR) MISC Scan glucose at least QID 2 each 11  . CONTOUR NEXT TEST test strip USE 1 STRIP TO TEST BLOOD  SUGAR TWICE DAILY 200 strip 3  . famotidine (PEPCID) 20 MG tablet Take 1 tablet (20 mg total) by mouth 2 (two) times daily. 120 tablet 5  . gabapentin (NEURONTIN) 100 MG capsule Take 1 capsule (100 mg total) by mouth 2 (two) times daily as needed. (Patient not taking: No sig reported) 60 capsule 1  . gabapentin (NEURONTIN) 100 MG capsule Take 1 capsule (100 mg total) by mouth 3 (three) times daily. 90 capsule 0  . hydrochlorothiazide (HYDRODIURIL) 25 MG tablet Take 25 mg by mouth daily.    . insulin lispro (HUMALOG KWIKPEN) 100 UNIT/ML KwikPen Inject 10 units into the skin 10 minutes prior to lunch & dinner. Hold if glucose is less than 150. (Patient taking differently: 10 Units. Inject 10-15 units into  the skin 10 minutes prior to lunch & dinner. Hold if glucose is less than 150.) 15 mL 5  . Insulin Pen Needle (PEN NEEDLES) 32G X 6 MM MISC Used to give insulin injections 4 times daily. 100 each 10  . lactulose (CHRONULAC) 10 GM/15ML solution Take 15 mLs (10 g total) by mouth 2 (two) times daily as needed for moderate constipation or severe constipation. 236 mL 0  . Lancets (ONETOUCH ULTRASOFT) lancets Used to check blood sugars twice daily. 100 each 12  . LANTUS SOLOSTAR 100 UNIT/ML Solostar Pen INJECT SUBCUTANEOUSLY 50  UNITS DAILY (Patient taking differently: Inject 50 Units into the skin.) 45 mL 3  . lidocaine-prilocaine (EMLA) cream Apply to affected area once 30 g 3  . [name] Take 5 mg by mouth at bedtime as needed.     . metFORMIN (GLUCOPHAGE XR) 500 MG 24 hr tablet Take 4  tablets (2,000 mg total) by mouth every evening. 120 tablet 3  . metoprolol succinate (TOPROL-XL) 100 MG 24 hr tablet TAKE 1 TABLET BY MOUTH  DAILY WITH OR IMMEDIATELY  FOLLOWING A MEAL 90 tablet 3  . ondansetron (ZOFRAN ODT) 4 MG disintegrating tablet Take 1 tablet (4 mg total) by mouth every 8 (eight) hours as needed for nausea or vomiting. (Patient not taking: Reported on 08/28/2020) 30 tablet 2  . ondansetron (ZOFRAN) 8 MG tablet Take 1 tablet (8 mg total) by mouth 2 (two) times daily as needed for refractory nausea / vomiting. Start on day 3 after chemotherapy. (Patient not taking: Reported on 08/28/2020) 30 tablet 1  . polyethylene glycol powder (GLYCOLAX/MIRALAX) 17 GM/SCOOP powder Take by mouth daily. 1/2 capful QD    . potassium chloride (KLOR-CON) 10 MEQ tablet Take 1 tablet (10 mEq total) by mouth daily. 30 tablet 0  . prochlorperazine (COMPAZINE) 10 MG tablet Take 1 tablet (10 mg total) by mouth every 6 (six) hours as needed (Nausea or vomiting). 30 tablet 1  . senna (SENOKOT) 8.6 MG TABS tablet Take 2 tablets (17.2 mg total) by mouth daily. 120 tablet 0  . tamsulosin (FLOMAX) 0.4 MG CAPS capsule Take 1 capsule (0.4 mg total) by mouth daily. (Patient not taking: No sig reported) 30 capsule 1  . telmisartan (MICARDIS) 40 MG tablet Take 1 tablet (40 mg total) by mouth daily. PLEASE CALL OFFICE TO SCHEDULE FOLLOW UP APPOINTMENT FOR ANYMORE REFILLS. 90 tablet 0   Current Facility-Administered Medications on File Prior to Visit  Medication Dose Route Frequency Provider Last Rate Last Admin  . 0.9 %  sodium chloride infusion  500 mL Intravenous Once Doran Stabler, MD        Social History   Tobacco Use  . Smoking status: Never Smoker  . Smokeless tobacco: Never Used  Vaping Use  . Vaping Use: Never used  Substance Use Topics  . Alcohol use: Yes    Alcohol/week: 1.0 standard drink    Types: 1 Glasses of wine per week    Comment: very occasional  . Drug use: Never    Review of  Systems    Objective:    There were no vitals taken for this visit. BP Readings from Last 3 Encounters:  08/28/20 129/86  08/14/20 138/70  08/12/20 (!) 152/77   Wt Readings from Last 3 Encounters:  08/28/20 234 lb 3 oz (106.2 kg)  08/12/20 241 lb 11.2 oz (109.6 kg)  07/29/20 244 lb 6.4 oz (110.9 kg)    Physical Exam  Assessment & Plan:   Problem List Items Addressed This Visit   None      I am having [name] maintain his onetouch ultrasoft, [name], Lantus SoloStar, metoprolol succinate, insulin lispro, Pen Needles, FreeStyle Libre 2 Sensor, ondansetron, ALPRAZolam, Contour Next Test, tamsulosin, telmisartan, lidocaine-prilocaine, prochlorperazine, ondansetron, famotidine, polyethylene glycol powder, amLODipine, hydrochlorothiazide, gabapentin, metFORMIN, senna, lactulose, gabapentin, and potassium chloride. We will continue to administer sodium chloride.   No orders of the defined types were placed in this encounter.   Return precautions given.   Risks, benefits, and alternatives of the medications and treatment plan prescribed today were discussed, and patient expressed understanding.   Education regarding symptom management and diagnosis given to patient on AVS.  Continue to follow with Burnard Hawthorne, FNP for routine health maintenance.   [name] and I agreed with plan.   Mable Paris, FNP

## 2020-09-06 ENCOUNTER — Ambulatory Visit: Payer: Managed Care, Other (non HMO) | Admitting: Family

## 2020-09-11 ENCOUNTER — Encounter: Payer: Self-pay | Admitting: Family

## 2020-09-11 ENCOUNTER — Inpatient Hospital Stay: Payer: Managed Care, Other (non HMO) | Attending: Oncology

## 2020-09-11 ENCOUNTER — Inpatient Hospital Stay: Payer: Managed Care, Other (non HMO)

## 2020-09-11 ENCOUNTER — Encounter: Payer: Self-pay | Admitting: Oncology

## 2020-09-11 ENCOUNTER — Inpatient Hospital Stay: Payer: Managed Care, Other (non HMO) | Admitting: Oncology

## 2020-09-11 ENCOUNTER — Other Ambulatory Visit: Payer: Self-pay

## 2020-09-11 ENCOUNTER — Telehealth: Payer: Self-pay | Admitting: Family

## 2020-09-11 VITALS — BP 101/77 | HR 116 | Temp 97.2°F | Resp 18 | Wt 234.3 lb

## 2020-09-11 DIAGNOSIS — C7A098 Malignant carcinoid tumors of other sites: Secondary | ICD-10-CM | POA: Insufficient documentation

## 2020-09-11 DIAGNOSIS — T451X5A Adverse effect of antineoplastic and immunosuppressive drugs, initial encounter: Secondary | ICD-10-CM

## 2020-09-11 DIAGNOSIS — Z794 Long term (current) use of insulin: Secondary | ICD-10-CM | POA: Insufficient documentation

## 2020-09-11 DIAGNOSIS — E1365 Other specified diabetes mellitus with hyperglycemia: Secondary | ICD-10-CM

## 2020-09-11 DIAGNOSIS — C787 Secondary malignant neoplasm of liver and intrahepatic bile duct: Secondary | ICD-10-CM | POA: Insufficient documentation

## 2020-09-11 DIAGNOSIS — E11649 Type 2 diabetes mellitus with hypoglycemia without coma: Secondary | ICD-10-CM | POA: Diagnosis not present

## 2020-09-11 DIAGNOSIS — Z7189 Other specified counseling: Secondary | ICD-10-CM

## 2020-09-11 DIAGNOSIS — C20 Malignant neoplasm of rectum: Secondary | ICD-10-CM

## 2020-09-11 DIAGNOSIS — G620 Drug-induced polyneuropathy: Secondary | ICD-10-CM | POA: Diagnosis not present

## 2020-09-11 DIAGNOSIS — C7A8 Other malignant neuroendocrine tumors: Secondary | ICD-10-CM | POA: Diagnosis not present

## 2020-09-11 DIAGNOSIS — E876 Hypokalemia: Secondary | ICD-10-CM | POA: Diagnosis not present

## 2020-09-11 DIAGNOSIS — R112 Nausea with vomiting, unspecified: Secondary | ICD-10-CM

## 2020-09-11 DIAGNOSIS — C7951 Secondary malignant neoplasm of bone: Secondary | ICD-10-CM

## 2020-09-11 DIAGNOSIS — Z5111 Encounter for antineoplastic chemotherapy: Secondary | ICD-10-CM

## 2020-09-11 DIAGNOSIS — R42 Dizziness and giddiness: Secondary | ICD-10-CM | POA: Diagnosis not present

## 2020-09-11 DIAGNOSIS — Z79899 Other long term (current) drug therapy: Secondary | ICD-10-CM | POA: Insufficient documentation

## 2020-09-11 DIAGNOSIS — Z5112 Encounter for antineoplastic immunotherapy: Secondary | ICD-10-CM | POA: Diagnosis present

## 2020-09-11 DIAGNOSIS — R55 Syncope and collapse: Secondary | ICD-10-CM | POA: Insufficient documentation

## 2020-09-11 DIAGNOSIS — I10 Essential (primary) hypertension: Secondary | ICD-10-CM

## 2020-09-11 DIAGNOSIS — R000 Tachycardia, unspecified: Secondary | ICD-10-CM

## 2020-09-11 LAB — CBC WITH DIFFERENTIAL/PLATELET
Abs Immature Granulocytes: 0.04 10*3/uL (ref 0.00–0.07)
Basophils Absolute: 0.1 10*3/uL (ref 0.0–0.1)
Basophils Relative: 1 %
Eosinophils Absolute: 0.2 10*3/uL (ref 0.0–0.5)
Eosinophils Relative: 2 %
HCT: 47.6 % (ref 39.0–52.0)
Hemoglobin: 16.3 g/dL (ref 13.0–17.0)
Immature Granulocytes: 0 %
Lymphocytes Relative: 27 %
Lymphs Abs: 2.9 10*3/uL (ref 0.7–4.0)
MCH: 28.5 pg (ref 26.0–34.0)
MCHC: 34.2 g/dL (ref 30.0–36.0)
MCV: 83.2 fL (ref 80.0–100.0)
Monocytes Absolute: 1.2 10*3/uL — ABNORMAL HIGH (ref 0.1–1.0)
Monocytes Relative: 11 %
Neutro Abs: 6.5 10*3/uL (ref 1.7–7.7)
Neutrophils Relative %: 59 %
Platelets: 219 10*3/uL (ref 150–400)
RBC: 5.72 MIL/uL (ref 4.22–5.81)
RDW: 15.7 % — ABNORMAL HIGH (ref 11.5–15.5)
WBC: 10.8 10*3/uL — ABNORMAL HIGH (ref 4.0–10.5)
nRBC: 0 % (ref 0.0–0.2)

## 2020-09-11 LAB — COMPREHENSIVE METABOLIC PANEL
ALT: 16 U/L (ref 0–44)
AST: 24 U/L (ref 15–41)
Albumin: 3.8 g/dL (ref 3.5–5.0)
Alkaline Phosphatase: 100 U/L (ref 38–126)
Anion gap: 14 (ref 5–15)
BUN: 16 mg/dL (ref 6–20)
CO2: 21 mmol/L — ABNORMAL LOW (ref 22–32)
Calcium: 9.2 mg/dL (ref 8.9–10.3)
Chloride: 94 mmol/L — ABNORMAL LOW (ref 98–111)
Creatinine, Ser: 1.03 mg/dL (ref 0.61–1.24)
GFR, Estimated: >60 mL/min (ref >60)
Glucose, Bld: 404 mg/dL — ABNORMAL HIGH (ref 70–99)
Potassium: 4.1 mmol/L (ref 3.5–5.1)
Sodium: 129 mmol/L — ABNORMAL LOW (ref 135–145)
Total Bilirubin: 1.5 mg/dL — ABNORMAL HIGH (ref 0.3–1.2)
Total Protein: 7.6 g/dL (ref 6.5–8.1)

## 2020-09-11 MED ORDER — HEPARIN SOD (PORK) LOCK FLUSH 100 UNIT/ML IV SOLN
INTRAVENOUS | Status: AC
  Filled 2020-09-11: qty 5

## 2020-09-11 MED ORDER — SODIUM CHLORIDE 0.9% FLUSH
10.0000 mL | Freq: Once | INTRAVENOUS | Status: AC
  Administered 2020-09-11: 10 mL via INTRAVENOUS
  Filled 2020-09-11: qty 10

## 2020-09-11 MED ORDER — HEPARIN SOD (PORK) LOCK FLUSH 100 UNIT/ML IV SOLN
500.0000 [IU] | Freq: Once | INTRAVENOUS | Status: AC
  Administered 2020-09-11: 500 [IU] via INTRAVENOUS
  Filled 2020-09-11: qty 5

## 2020-09-11 MED ORDER — SODIUM CHLORIDE 0.9 % IV SOLN
Freq: Once | INTRAVENOUS | Status: AC
  Filled 2020-09-11: qty 250

## 2020-09-11 MED ORDER — ONDANSETRON HCL 4 MG/2ML IJ SOLN
8.0000 mg | Freq: Once | INTRAMUSCULAR | Status: AC
  Administered 2020-09-11: 8 mg via INTRAVENOUS
  Filled 2020-09-11: qty 4

## 2020-09-11 NOTE — Addendum Note (Signed)
Addended by: Earlie Server on: 09/11/2020 04:03 PM   Modules accepted: Orders

## 2020-09-11 NOTE — Progress Notes (Addendum)
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  male with PMH listed below was seen in consultation at the request of  Burnard Hawthorne, FNP  for evaluation of liver and pancreatic mass  #reports symptoms of upper abdomen band like discomfort and bloating for a few months, improved symptoms with omeprazole,.Chronic constipation, he has blood in the stool occationally Seen by PCP in September 2021. AlK phosphatase was recently noted to be elevated as well well elevated GGT. He was referred to see gastroenteralgias on 04/10/2020.  04/25/2020 EGD showed gastric fundus mild inflammation. Biopsy showed reactive gastropathy.  04/30/2020 [place] RUQ showed cirrhosis with multiple hepatic masses.  05/01/2020 AFP 1.7 05/02/2020 MR liver w wo contrast showed numerous heterozygous enhancing liver lesions, throughout the liver, not typical for [facility], likely metastatic disease.  Solid appearing enhancing lesion in pancreatic tail 1.9x1.5cm, suspicious for primary neoplasm.  Mild adenopathy within porta hepatic region and porta hepatic region. Splenomegaly.   Patient has history of SVT, psoriatic arthritis, morbid obesity, GERD, DM.  Family history + maternal uncle with esophageal cancer.   + unintentional weight loss, no fever, chills. "sweats a lot". Currently on antibiotics for proctitis.  He works at [employer], lives in [place]. He lives with his partner.  During today's visit, he felt  dizziness and lightheaded when sitting on the examination table.  He lost consciousness briefly for about 2-3 minutes, regain consciousness spontaneously, perfusing sweating. SBP in 90s, and HR initially in 30-40s, later improved to 70s.  He denies any chest pain, SOB. Blood sugar was 207.   # 05/17/2020, colonoscopy showed a fungating and infiltrative partially obstructing large mass in the proximal rectum and in the mid rectum.  The mass was partially circumferential, involving two thirds of the lumen circumference is.  Biopsy showed adenocarcinoma.  05/23/2020, patient underwent EUS biopsy of the pancreatic tail lesion.  Biopsy pathology showed well-differentiated neuroendocrine tumor.  Ki-67 is noncontributory due to insufficient tissue in the cell block.  Genetic testing is negative.   INTERVAL HISTORY Dominic [name] is a 50 y.o. 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 Problems and complaints are listed below: Patient reports feeling nauseated today.  Since last night his heart has been racing.  He has a history of SVT.  Denies any abdominal pain, vomiting, diarrhea.  He is constipated.  Did not eat his dinner or breakfast today..  Denies any syncope, lightheadedness, chest pain.    Review of Systems  Constitutional: Positive for fatigue and unexpected weight change. Negative for appetite change, chills and fever.  HENT:   Negative for hearing loss and voice change.   Eyes: Negative for eye problems and icterus.  Respiratory: Negative for chest tightness, cough and shortness of breath.   Cardiovascular: Positive for palpitations. Negative for chest pain and leg swelling.  Gastrointestinal: Positive for nausea. Negative for abdominal distention and abdominal pain.  Endocrine: Negative for hot flashes.  Genitourinary: Negative for difficulty urinating, dysuria and  frequency.    Musculoskeletal: Negative for arthralgias.  Skin: Negative for itching and rash.  Neurological: Positive for numbness. Negative for light-headedness.  Hematological: Negative for adenopathy. Does not bruise/bleed easily.  Psychiatric/Behavioral: Negative for confusion.    MEDICAL HISTORY:  Past Medical History:  Diagnosis Date  . Anxiety   . Cancer [facility])    Rectal cancer metastasized to liver ([facility])  . Diabetes mellitus without complication ([place])   . Dysrhythmia    svt  . Family history of esophageal cancer   . Family history of prostate cancer   . GERD (gastroesophageal reflux disease)   . High triglycerides   . Hypertension   . Long-term insulin use in type 2 diabetes ([place])   . Morbid obesity ([place])   . Psoriatic arthritis ([place])   . SVT (supraventricular tachycardia) ([place])     SURGICAL HISTORY: Past Surgical History:  Procedure Laterality Date  . EUS N/A 05/23/2020   Procedure: FULL UPPER ENDOSCOPIC ULTRASOUND (EUS) RADIAL;  Surgeon: Jola Schmidt, MD;  Location: [facility] ENDOSCOPY;  Service: Endoscopy;  Laterality: N/A;  . PORTA CATH INSERTION N/A 05/30/2020   Procedure: PORTA CATH INSERTION;  Surgeon: Algernon Huxley, MD;  Location: [place] [facility];  Service: Cardiovascular;  Laterality: N/A;  . UPPER GASTROINTESTINAL ENDOSCOPY      SOCIAL HISTORY: Social History   Socioeconomic History  . Marital status: Soil scientist    Spouse name: Marc Morgans  . Number of children: Not on file  . Years of education: Not on file  . Highest education level: Not on file  Occupational History  . Not on file  Tobacco Use  . Smoking status: Never Smoker  . Smokeless tobacco: Never Used  Vaping Use  . Vaping Use: Never used  Substance and Sexual Activity  . Alcohol use: Yes    Alcohol/week: 1.0 standard drink    Types: 1 Glasses of wine per week    Comment: very occasional  . Drug use: Never  . Sexual activity: Not on file  Other Topics Concern  . Not on file  Social History  Narrative   Lives in [place].  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     Social Determinants of Health   Financial Resource Strain: Medium Risk  . Difficulty of Paying Living Expenses: Somewhat hard  Food Insecurity: Not on file  Transportation Needs: Not on file  Physical Activity: Not on file  Stress: Not on file  Social Connections: Not on file  Intimate Partner Violence: Not on file    FAMILY HISTORY: Family History  Problem Relation Age of Onset  . Arthritis Mother   . Heart disease Mother   . Supraventricular tachycardia Mother        s/p ablation  . Hypertension Mother   . Diabetes Father   . Heart attack Maternal Uncle   . Throat cancer Maternal Uncle   . Esophageal cancer Maternal Uncle   . Heart attack Maternal Uncle   . Heart attack Maternal Uncle   . Prostate cancer Maternal Uncle   . Thyroid cancer Neg Hx   . Colon cancer Neg Hx   . Rectal cancer Neg Hx   . Stomach cancer Neg Hx   . Colon polyps Neg Hx     ALLERGIES:  has No Known Allergies.  MEDICATIONS:  Current Outpatient Medications  Medication Sig Dispense Refill  . ALPRAZolam (XANAX) 0.5 MG tablet Take half tablet to one tablet by mouth as needed daily for insomnia, anxiety. [place]  tablet 0  . amLODipine (NORVASC) 10 MG tablet TAKE 1 TABLET BY MOUTH  DAILY 90 tablet 3  . Continuous Blood Gluc Sensor (FREESTYLE LIBRE 2 SENSOR) MISC Scan glucose at least QID 2 each 11  . CONTOUR NEXT TEST test strip USE 1 STRIP TO TEST BLOOD  SUGAR TWICE DAILY 200 strip 3  . famotidine (PEPCID) 20 MG tablet Take 1 tablet (20 mg total) by mouth 2 (two) times daily. 120 tablet 5  . hydrochlorothiazide (HYDRODIURIL) 25 MG tablet Take 25 mg by mouth daily.    . insulin lispro (HUMALOG KWIKPEN) 100 UNIT/ML KwikPen Inject 10 units into the skin 10 minutes prior to lunch & dinner. Hold if glucose is less than 150. (Patient taking differently: 10 Units. Inject 10-15 units into the skin 10 minutes prior to lunch & dinner. Hold if  glucose is less than 150.) 15 mL 5  . Insulin Pen Needle (PEN NEEDLES) 32G X 6 MM MISC Used to give insulin injections 4 times daily. 100 each 10  . lactulose (CHRONULAC) 10 GM/15ML solution Take 15 mLs (10 g total) by mouth 2 (two) times daily as needed for moderate constipation or severe constipation. 236 mL 0  . Lancets (ONETOUCH ULTRASOFT) lancets Used to check blood sugars twice daily. 100 each 12  . LANTUS SOLOSTAR 100 UNIT/ML Solostar Pen INJECT SUBCUTANEOUSLY 50  UNITS DAILY (Patient taking differently: Inject 50 Units into the skin.) 45 mL 3  . lidocaine-prilocaine (EMLA) cream Apply to affected area once 30 g 3  . [name] Take 5 mg by mouth at bedtime as needed.     . metFORMIN (GLUCOPHAGE XR) 500 MG 24 hr tablet Take 4 tablets (2,000 mg total) by mouth every evening. 120 tablet 3  . metoprolol succinate (TOPROL-XL) 100 MG 24 hr tablet TAKE 1 TABLET BY MOUTH  DAILY WITH OR IMMEDIATELY  FOLLOWING A MEAL 90 tablet 3  . ondansetron (ZOFRAN ODT) 4 MG disintegrating tablet Take 1 tablet (4 mg total) by mouth every 8 (eight) hours as needed for nausea or vomiting. 30 tablet 2  . ondansetron (ZOFRAN) 8 MG tablet Take 1 tablet (8 mg total) by mouth 2 (two) times daily as needed for refractory nausea / vomiting. Start on day 3 after chemotherapy. 30 tablet 1  . polyethylene glycol powder (GLYCOLAX/MIRALAX) 17 GM/SCOOP powder Take by mouth daily. 1/2 capful QD    . potassium chloride (KLOR-CON) 10 MEQ tablet Take 1 tablet (10 mEq total) by mouth daily. 30 tablet 0  . prochlorperazine (COMPAZINE) 10 MG tablet Take 1 tablet (10 mg total) by mouth every 6 (six) hours as needed (Nausea or vomiting). 30 tablet 1  . senna (SENOKOT) 8.6 MG TABS tablet Take 2 tablets (17.2 mg total) by mouth daily. 120 tablet 0  . telmisartan (MICARDIS) 40 MG tablet Take 1 tablet (40 mg total) by mouth daily. PLEASE CALL OFFICE TO SCHEDULE FOLLOW UP APPOINTMENT FOR ANYMORE REFILLS. 90 tablet 0  . gabapentin (NEURONTIN)  100 MG capsule Take 1 capsule (100 mg total) by mouth 2 (two) times daily as needed. (Patient not taking: No sig reported) 60 capsule 1  . gabapentin (NEURONTIN) 100 MG capsule Take 1 capsule (100 mg total) by mouth 3 (three) times daily. (Patient not taking: Reported on 09/11/2020) 90 capsule 0  . tamsulosin (FLOMAX) 0.4 MG CAPS capsule Take 1 capsule (0.4 mg total) by mouth daily. (Patient not taking: Reported on 09/11/2020) 30 capsule 1   Current Facility-Administered Medications  Medication Dose  Route Frequency Provider Last Rate Last Admin  . 0.9 %  sodium chloride infusion  500 mL Intravenous Once Nelida Meuse III, MD         PHYSICAL EXAMINATION: ECOG PERFORMANCE STATUS: 1 - Symptomatic but completely ambulatory Vitals:   09/11/20 0853  BP: 101/77  Pulse: (!) 116  Resp: 18  Temp: (!) 97.2 F (36.2 C)  SpO2: 99%   Filed Weights   09/11/20 0853  Weight: 234 lb 4.8 oz (106.3 kg)    Physical Exam Constitutional:      General: He is not in acute distress. HENT:     Head: Normocephalic and atraumatic.  Eyes:     General: No scleral icterus. Cardiovascular:     Rate and Rhythm: Regular rhythm. Tachycardia present.     Heart sounds: Normal heart sounds.  Pulmonary:     Effort: Pulmonary effort is normal. No respiratory distress.     Breath sounds: No wheezing.  Abdominal:     General: Bowel sounds are normal. There is no distension.     Palpations: Abdomen is soft.  Musculoskeletal:        General: No deformity. Normal range of motion.     Cervical back: Normal range of motion and neck supple.  Skin:    General: Skin is warm and dry.     Findings: No erythema or rash.  Neurological:     Mental Status: He is alert and oriented to person, place, and time. Mental status is at baseline.     Cranial Nerves: No cranial nerve deficit.     Coordination: Coordination normal.  Psychiatric:        Mood and Affect: Mood normal.     LABORATORY DATA:  I have reviewed the data  as listed Lab Results  Component Value Date   WBC 10.8 (H) 09/11/2020   HGB 16.3 09/11/2020   HCT 47.6 09/11/2020   MCV 83.2 09/11/2020   PLT 219 09/11/2020   Recent Labs    04/10/20 1021 04/25/20 0942 05/07/20 0933 06/03/20 0815 06/21/20 0908 06/28/20 1019 08/12/20 0829 08/28/20 0816 09/11/20 0820  NA  --   --  136   < > 135   < > 128* 132* 129*  K  --   --  4.4   < > 4.8   < > 3.4* 3.5 4.1  CL  --   --  98   < > 95*   < > 93* 92* 94*  CO2  --   --  21   < > 22   < > 28 27 21*  GLUCOSE  --   --  175*   < > 202*   < > 364* 332* 404*  BUN 18  --  12   < > 11   < > 12 15 16   CREATININE 0.91  --  0.86   < > 0.80   < > 0.90 0.83 1.03  CALCIUM  --   --  9.2   < > 9.2   < > 8.9 9.3 9.2  GFRNONAA 98  --  101   < > 104   < > >60 >60 >60  GFRAA 113  --  117  --  120  --   --   --   --   PROT  --  6.5  --    < >  --    < > 7.7 7.9 7.6  ALBUMIN  --  3.1*  --    < >  --    < >  3.7 3.9 3.8  AST  --  18  --    < >  --    < > 29 24 24   ALT  --  17  --    < >  --    < > 16 16 16   ALKPHOS  --  215*  --    < >  --    < > 100 92 100  BILITOT  --  0.7  --    < >  --    < > 1.0 1.2 1.5*  BILIDIR  --  0.47*  --   --   --   --   --   --   --    < > = values in this interval not displayed.   Iron/TIBC/Ferritin/ %Sat No results found for: IRON, TIBC, FERRITIN, IRONPCTSAT    RADIOGRAPHIC STUDIES: I have personally reviewed the radiological images as listed and agreed with the findings in the report. NM PET (NETSPOT GA 54 DOTATATE) SKULL BASE TO MID THIGH  Result Date: 08/21/2020 CLINICAL DATA:  Neuroendocrine tumor of the pancreas. Colorectal carcinoma liver metastasis. Rectal carcinoma. EXAM: NUCLEAR MEDICINE PET SKULL BASE TO THIGH TECHNIQUE: 5.6 mCi gallium 68 DOTATATE was injected intravenously. Full-ring PET imaging was performed from the skull base to thigh after the radiotracer. CT data was obtained and used for attenuation correction and anatomic localization. COMPARISON:  CT 07/18/2020,  MRI 05/02/2020 FINDINGS: NECK No radiotracer activity in neck lymph nodes. Incidental CT findings: None CHEST No radiotracer accumulation within mediastinal or hilar lymph nodes. No suspicious pulmonary nodules on the CT scan. Incidental CT finding:None ABDOMEN/PELVIS Lesion in the mid pancreas measuring approximately 3 cm (image 153) has intense radiotracer activity SUV max equal 15.5. Lesion difficult to define on noncontrast CT. There are large low-density lesions in the liver which do not accumulate radiotracer. These lesions have radiotracer activity less than background physiologic liver activity. There are no radiotracer avid retroperitoneal lymph nodes. No focal radiotracer avid peritoneal lesions. No radiotracer avid lesions lesions the bowel. Physiologic activity noted in the liver, spleen, adrenal glands and kidneys. Incidental CT findings:No evidence of bowel obstruction. SKELETON No focal activity to suggest skeletal metastasis. Incidental CT findings:None IMPRESSION: 1. Intense radiotracer activity associated with mid pancreatic body lesion. Findings consistent well differentiated neuroendocrine tumor of the pancreas. 2. No evidence of metastatic well differentiated neuroendocrine tumor. 3. Multifocal liver metastasis without radiotracer activity consistent with colorectal adenocarcinoma metastasis. Electronically Signed   By: Suzy Bouchard M.D.   On: 08/21/2020 13:28      ASSESSMENT & PLAN:  1. Rectal cancer metastasized to liver ([place])   2. Encounter for antineoplastic chemotherapy   3. Neuroendocrine carcinoma of pancreas ([place])   4. Neuropathy due to chemotherapeutic drug [facility])   Cancer Staging Neuroendocrine carcinoma of pancreas ([place]) Staging form: Exocrine Pancreas, AJCC 8th Edition - Clinical: Stage IB (cT2, cN0, cM0) - Signed by Earlie Server, MD on 09/11/2020  Rectal cancer metastasized to liver [facility]) Staging form: Colon and Rectum, AJCC 8th Edition - Clinical stage from  06/03/2020: Stage Unknown (cTX, cNX, pM1) - Signed by Earlie Server, MD on 06/03/2020   #Stage IV rectal cancer On first-line palliative chemotherapy with FOLFOX and bevacizumab.   Labs are reviewed and are discussed with patient. Status post 7 cycles of FOLFOX/bevacizumab. CEA is pending. Hold off chemotherapy due to unstable vital signs-tachycardia.  #SVT, patient has history of SVT.  Planning tachycardia today in the clinic.  EKG was performed  and independently reviewed by me.  Consistent with SVT.  Patient does not want to emergency room.  Patient reports that he has had intermittent SVT episodes.  Previously seen by cardiology.  I encourage patient to call cardiology today.  #Hyponatremia, probably secondary to hypoglycemia.  Poor oral intake.  Patient received IV 1 L of normal saline x1 today. #Nausea, IV Zofran 8 mg x 1 today. #Pancreatic well differentiated neuroendocrine carcinoma, symptomatic.  Dotatate PET scan was reviewed and discussed with patient.  Lesion has grown in size now 3cm.  high CEA may also be partially from neuroendocrine carcinoma. Intermittent diarrhea which may be secondary to chemotherapy versus secondary to neuroendocrine carcinoma.. I have communicated with radiation oncology Dr. Baruch Gouty who does not recommend radiation given his extensive liver metastasis from rectal cancer.Not surgical candidate due to rectal cancer.   I discussed with patient about rationale of somatostatin analogues and potential side effects.  And he agrees with the treatment.  Discussed with prior authorization team, his insurance prefers lanreotide.  somatostatin analogues may further affect his glucose level. Discussed with patient. Awaiting further characterization of this tumor. Check serum glucogon level, gastrin,VIP   # Chemotherapy induced neuropathy. Grade 1  gabapentin 100 mg 3 times a day.  Potential side effects were reviewed with patient and he agrees. # Hypokalemia, K 4.1 ,  improved. continue potassium chloride 10 mEq daily.   # Uncontrolled diabetes,  continue follow-up with primary care provider for glycemic control.  Recommend her to be comply with strict diabetic dieting. Glucose is in 400s.  Recommend him to go to ER, he declined.   All questions were answered. The patient knows to call the clinic with any problems questions or concerns.  cc Burnard Hawthorne, FNP    Return of visit: 1 week  Earlie Server, MD, PhD Hematology Oncology [facility] at [facility] Pager- [phone] 09/11/2020

## 2020-09-11 NOTE — Telephone Encounter (Signed)
Thank you for letting me know. EKG shows SVT in the 130s. I've sent him a MyChart message to see how he is feeling, if he has any home readings, how he is taking the Toprol. I will try to call him tomorrow as well. I have [place] our scheduling team as well to try to get him into see myself, Dr. Saunders Revel, or one of our other APP's.   Loel Dubonnet, NP

## 2020-09-11 NOTE — Progress Notes (Signed)
Patient here for follow up. Pt report that he has been having indigestion after he eats. Pt reports that he was not feeling to well yesterday, his heart was racing and he was nauseas.

## 2020-09-11 NOTE — Telephone Encounter (Signed)
  Caitlin,  I received note from Onc Dr Tasia Catchings that patient's heart rate was between 115-30 today in her office. She was worried and sent me a secure chat. He refused ED and stated he would call your office.  His blood sugar was very high which I am hoping to address with him on Friday. Can you reach out to him to schedule as well? He has so much going and I feel for him.    Sarah,  Call pt I rec'ed a note from  Please schedule  Him to be seen asap regarding blood sugars. Please advise that if HR remains elevated or certainly he develops sob, cp, he must go to ED. We have reached out to cardiology as well.   Catie, can you join me on this appt?

## 2020-09-12 LAB — CEA: CEA: 1757 ng/mL — ABNORMAL HIGH (ref 0.0–4.7)

## 2020-09-12 NOTE — Telephone Encounter (Signed)
I spoke with patient & he is scheduled for a VV tomorrow at 9am.

## 2020-09-12 NOTE — Telephone Encounter (Signed)
No open slots this week.  Please advise.

## 2020-09-12 NOTE — Telephone Encounter (Signed)
Spoke with patient to schedule from waitlist today.  Declined visit at this time .  He states his svt has calmed down and pulse is now 80.  Patient attributes increased rate to caffeine in protein shakes.  He believes it has worked its way out of his system now and he will be mindful of this going forward.   Patient aware to call office if needing to be seen in future and agrees to do so.    Patient wants Urban Gibson to be aware he is doing ok.

## 2020-09-13 ENCOUNTER — Telehealth (INDEPENDENT_AMBULATORY_CARE_PROVIDER_SITE_OTHER): Payer: Managed Care, Other (non HMO) | Admitting: Family

## 2020-09-13 ENCOUNTER — Telehealth: Payer: Self-pay | Admitting: Family

## 2020-09-13 ENCOUNTER — Encounter: Payer: Self-pay | Admitting: Family

## 2020-09-13 VITALS — BP 128/74 | HR 91 | Wt 234.0 lb

## 2020-09-13 DIAGNOSIS — I10 Essential (primary) hypertension: Secondary | ICD-10-CM

## 2020-09-13 DIAGNOSIS — Z794 Long term (current) use of insulin: Secondary | ICD-10-CM | POA: Diagnosis not present

## 2020-09-13 DIAGNOSIS — E119 Type 2 diabetes mellitus without complications: Secondary | ICD-10-CM

## 2020-09-13 DIAGNOSIS — I471 Supraventricular tachycardia, unspecified: Secondary | ICD-10-CM

## 2020-09-13 MED ORDER — TRESIBA FLEXTOUCH 200 UNIT/ML ~~LOC~~ SOPN: 50.0000 [IU] | PEN_INJECTOR | Freq: Every evening | SUBCUTANEOUS | 3 refills | Status: DC

## 2020-09-13 MED ORDER — INSULIN LISPRO (1 UNIT DIAL) 100 UNIT/ML (KWIKPEN)
PEN_INJECTOR | SUBCUTANEOUS | 5 refills | Status: DC
Start: 2020-09-13 — End: 2020-10-21

## 2020-09-13 NOTE — Assessment & Plan Note (Addendum)
Palpitations resolved. Suspect caffeine and dehydration induced , also he has mild cold symptoms ( negative covid). He declines Zio monitor or increasing toprol at this time. He would like to continue toprol XR 100mg  as well as follow ups with Laurann Montana. Counseled on vagal maneuver for breaking SVT.  He will let us know of any concerns.

## 2020-09-13 NOTE — Patient Instructions (Addendum)
Suspect SVT likely triggered by  illness as well as dehydration. Please let us know if symptoms recur.   vagal maneuver as discussed for breaking SVT (please sit down first!).  STOP lantus Start tresiba 50 units.   You may give yourself 10 of Humalog with breakfast, lunch and dinner. Hold if glucose is less than 150. Also hold if you are not eating or give yourself half dose if low carb meal.   Please ensure labs taken with Dr Tasia Catchings 09/18/20.

## 2020-09-13 NOTE — Progress Notes (Signed)
Virtual Visit via Video Note  I connected with@  on 09/13/20 at  9:00 AM EST by a video enabled telemedicine application and verified that I am speaking with the correct person using two identifiers.  Location patient: home Location provider:work  Persons participating in the virtual visit: patient, provider  I discussed the limitations of evaluation and management by telemedicine and the availability of in person appointments. The patient expressed understanding and agreed to proceed.   HPI:  He is feeling better today. Complains of occasional cough and decided to do virtual, negative covid test today. No fever, sob.  Has felt drained from chemo the past couple of days. He is drinking protein shakes with caffeine and thinks caused increased heart rate and perhaps 'fighting a cold.'  In the past SVT has been exacerbated when he had COVID.   DM follow up- Elenor Legato has run out and trying get skin sensor refilled. Elenor Legato has alarm for hypoglycemia, hyperglycemia.FBG 158 today. Had two eggs, handful of chips, and postprandial 250 .  In the past 2 weeks, FGB has been 196-200. Post prandial largest meal, range from 200-350.  2 days ago, glucose 404 prior to chemo which was canceled and scheduled next week. Had felt nauseated and hadnt eaten for 10-12 hours. He had taken lantus and humalog both during this time.   Metformin 5000 qam and 1500mg  qhs. Continue lantus 50 units qpm, humalog 10-15 units with lunch and dinner. Had one hypoglycemic episode when [place] alarmed at 80 middle of night , this occurred 4 months ago and hadnt recurred since.    SVT- HR 80-91. No palpitations, cp, sob.  Compliant with Toprol 100mg  QD at the time and recommended to add Toprol 25mg  QHS for improved control, though he did not make that change   ROS: See pertinent positives and negatives per HPI.    EXAM:  VITALS per patient if applicable: BP 034/74   Pulse 91   Wt 234 lb (106.1 kg)   BMI 34.56 kg/m  BP  Readings from Last 3 Encounters:  09/13/20 128/74  09/11/20 101/77  08/30/20 113/64   Wt Readings from Last 3 Encounters:  09/13/20 234 lb (106.1 kg)  09/11/20 234 lb 4.8 oz (106.3 kg)  08/28/20 234 lb 3 oz (106.2 kg)    GENERAL: alert, oriented, appears well and in no acute distress  HEENT: atraumatic, conjunttiva clear, no obvious abnormalities on inspection of external nose and ears  NECK: normal movements of the head and neck  LUNGS: on inspection no signs of respiratory distress, breathing rate appears normal, no obvious gross SOB, gasping or wheezing  CV: no obvious cyanosis  MS: moves all visible extremities without noticeable abnormality  PSYCH/NEURO: pleasant and cooperative, no obvious depression or anxiety, speech and thought processing grossly intact  ASSESSMENT AND PLAN:  Discussed the following assessment and plan:  Problem List Items Addressed This Visit      Cardiovascular and Mediastinum   SVT (supraventricular tachycardia) (HCC)    Palpitations resolved. Suspect caffeine and dehydration induced , also he has mild cold symptoms ( negative covid). He declines Zio monitor or increasing toprol at this time. He would like to continue toprol XR 100mg  as well as follow ups with Laurann Montana. Counseled on vagal maneuver for breaking SVT.  He will let us know of any concerns.       Uncontrolled hypertension     Endocrine   Type 2 diabetes mellitus without complication, with long-term current use of insulin ([place]) -  Primary    Uncontrolled. Restart Libre for safety features ( alarm). Consulted with pharmD, Catie and we agreed to stop lantus, start tresiba 50 units qpm.  Increase Humalog 10 units to include with breakfast, continue with lunch and dinner. Advised to hold if glucose is less than 150. Long conversation regarding nausea, loss of appetite which we suspect due to chemo, discussed to HOLD humalog if  not eating or give half dose if low carb meal due to  concern for hypoglycemia. We discussed if need for correctional insulin, we arrange consult with endocrine.  Labs to be repeated next week with Dr Tasia Catchings.       Relevant Medications   insulin degludec (TRESIBA FLEXTOUCH) 200 UNIT/ML FlexTouch Pen   insulin lispro (HUMALOG KWIKPEN) 100 UNIT/ML KwikPen      -we discussed possible serious and likely etiologies, options for evaluation and workup, limitations of telemedicine visit vs in person visit, treatment, treatment risks and precautions. Pt prefers to treat via telemedicine empirically rather then risking or undertaking an in person visit at this moment.  .   I discussed the assessment and treatment plan with the patient. The patient was provided an opportunity to ask questions and all were answered. The patient agreed with the plan and demonstrated an understanding of the instructions.   The patient was advised to call back or seek an in-person evaluation if the symptoms worsen or if the condition fails to improve as anticipated.   Mable Paris, FNP

## 2020-09-13 NOTE — Assessment & Plan Note (Addendum)
Uncontrolled. Restart Libre for safety features ( alarm). Consulted with pharmD, Catie and we agreed to stop lantus, start tresiba 50 units qpm.  Increase Humalog 10 units to include with breakfast, continue with lunch and dinner. Advised to hold if glucose is less than 150. Long conversation regarding nausea, loss of appetite which we suspect due to chemo, discussed to HOLD humalog if  not eating or give half dose if low carb meal due to concern for hypoglycemia. We discussed if need for correctional insulin, we arrange consult with endocrine.  Labs to be repeated next week with Dr Tasia Catchings.

## 2020-09-13 NOTE — Telephone Encounter (Signed)
Lm on vm to call office and schedule 2w follow up for  Chronic Management

## 2020-09-14 ENCOUNTER — Other Ambulatory Visit: Payer: Self-pay | Admitting: Urology

## 2020-09-16 MED ORDER — TELMISARTAN 40 MG PO TABS: 40.0000 mg | ORAL_TABLET | Freq: Every day | ORAL | 1 refills | Status: DC

## 2020-09-16 NOTE — Progress Notes (Signed)
Left detailed message to inform patient.

## 2020-09-17 ENCOUNTER — Ambulatory Visit: Payer: Managed Care, Other (non HMO) | Admitting: Pharmacist

## 2020-09-17 ENCOUNTER — Telehealth: Payer: Self-pay

## 2020-09-17 DIAGNOSIS — E119 Type 2 diabetes mellitus without complications: Secondary | ICD-10-CM

## 2020-09-17 DIAGNOSIS — G620 Drug-induced polyneuropathy: Secondary | ICD-10-CM

## 2020-09-17 DIAGNOSIS — I471 Supraventricular tachycardia: Secondary | ICD-10-CM

## 2020-09-17 DIAGNOSIS — Z794 Long term (current) use of insulin: Secondary | ICD-10-CM

## 2020-09-17 DIAGNOSIS — I10 Essential (primary) hypertension: Secondary | ICD-10-CM

## 2020-09-17 NOTE — Patient Instructions (Signed)
Visit Information  Goals Addressed              This Visit's Progress     Patient Stated   .  Medication Monitoring (pt-stated)        Patient Goals/Self-Care Activities . Over the next 90 days, patient will:  - take medications as prescribed check glucose at least three times daily using CGM, document, and provide at future appointments Collaborate with interdisciplinary team        The patient verbalized understanding of instructions, educational materials, and care plan provided today and declined offer to receive copy of patient instructions, educational materials, and care plan.   Plan: Telephone follow up appointment with care management team member scheduled for:  ~ 1 week as previously scheduled  Catie Darnelle Maffucci, PharmD, [place], [place] Clinical Pharmacist [employer] at [employer] [phone]

## 2020-09-17 NOTE — Telephone Encounter (Signed)
Forwarded to ins auth dept that Lanreotide was added to tx plan for tomorrow.  Dominic [name] In auth dpt response:  This case is still pending and gone to physician review. The reason is that the entire case of his chemo had to be included with the lanreotide as they look at the lanreotide as chemo. So when I called to check the status, I was told that the case for the FOLFOX had to be completely redone and add the lanreotide. This will also be for 1 time if approved and the rest will have to be done through home health.

## 2020-09-17 NOTE — Chronic Care Management (AMB) (Addendum)
Care Management   Pharmacy Note  09/17/2020 Name: Dominic [name] MRN: [id number] DOB: [birth year]  Subjective: Dominic [name] is a 50 y.o. year old male who is a primary care patient of Burnard Hawthorne, FNP. The Care Management team was consulted for assistance with care management and care coordination needs.    Engaged with patient by telephone for in reponse to report from scheduler regarding medication cost in response to provider referral for pharmacy case management and/or care coordination services.   The patient was given information about Care Management services today including:  1. Care Management services includes personalized support from designated clinical staff supervised by the patient's primary care provider, including individualized plan of care and coordination with other care providers. 2. 24/7 contact phone numbers for assistance for urgent and routine care needs. 3. The patient may stop case management services at any time by phone call to the office staff.  Patient agreed to services and consent obtained.  Assessment:  Review of patient status, including review of consultants reports, laboratory and other test data, was performed as part of comprehensive evaluation and provision of chronic care management services.   SDOH (Social Determinants of Health) assessments and interventions performed:  SDOH Interventions   Flowsheet Row Most Recent Value  SDOH Interventions   Financial Strain Interventions Other (Comment)  [formulary discussion today]       Objective:  Lab Results  Component Value Date   CREATININE 1.03 09/11/2020   CREATININE 0.83 08/28/2020   CREATININE 0.90 08/12/2020    Lab Results  Component Value Date   HGBA1C 8.3 (H) 06/21/2020       Component Value Date/Time   CHOL 226 (H) 03/07/2020 0902   TRIG 123 03/07/2020 0902   HDL 33 (L) 03/07/2020 0902   CHOLHDL 6.8 (H) 03/07/2020 0902   LDLCALC 170 (H) 03/07/2020 0902     Clinical  ASCVD: No  The 10-year ASCVD risk score [name], et al., 2013) is: 13.8%   Values used to calculate the score:     Age: 14 years     Sex: Male     Is Non-Hispanic African American: No     Diabetic: Yes     Tobacco smoker: No     Systolic Blood Pressure: 409 mmHg     Is BP treated: Yes     HDL Cholesterol: 33 mg/dL     Total Cholesterol: 226 mg/dL     BP Readings from Last 3 Encounters:  09/13/20 128/74  09/11/20 101/77  08/30/20 113/64    Care Plan  No Known Allergies  Medications Reviewed Today    Reviewed by Burnard Hawthorne, FNP (Family Nurse Practitioner) on 09/13/20 at [place] List Status: <None>  Medication Order Taking? Sig Documenting Provider Last Dose Status Informant  0.9 %  sodium chloride infusion [id number]   Doran Stabler, MD  Active   ALPRAZolam Duanne Moron) 0.5 MG tablet [id number] Yes Take half tablet to one tablet by mouth as needed daily for insomnia, anxiety. Burnard Hawthorne, FNP Taking Active   amLODipine (NORVASC) 10 MG tablet [id number] Yes TAKE 1 TABLET BY MOUTH  DAILY End, Harrell Gave, MD Taking Active   Continuous Blood Gluc Sensor (FREESTYLE LIBRE 2 SENSOR) [place] [id number] Yes Scan glucose at least QID Burnard Hawthorne, FNP Taking Active   CONTOUR NEXT TEST test strip [id number] Yes USE 1 STRIP TO TEST BLOOD  SUGAR TWICE DAILY Vidal Schwalbe Yvetta Coder, FNP Taking Active  famotidine (PEPCID) 20 MG tablet [id number] Yes Take 1 tablet (20 mg total) by mouth 2 (two) times daily. Burnard Hawthorne, FNP Taking Active   gabapentin (NEURONTIN) 100 MG capsule [id number] Yes Take 1 capsule (100 mg total) by mouth 3 (three) times daily. Earlie Server, MD Taking Active   hydrochlorothiazide (HYDRODIURIL) 25 MG tablet [id number] Yes Take 25 mg by mouth daily. [provider] Taking Active   insulin lispro (HUMALOG KWIKPEN) 100 UNIT/ML KwikPen [id number] Yes Inject 10 units into the skin 10 minutes prior to lunch & dinner. Hold if glucose is less than 150.   Patient taking differently: 10 Units. Inject 10-15 units into the skin 10 minutes prior to lunch & dinner. Hold if glucose is less than 150.   Burnard Hawthorne, FNP Taking Active            Med Note Darnelle Maffucci, Arville Lime   Wed Jun 12, 2020 10:45 AM) 10 units with meals   Insulin Pen Needle (PEN NEEDLES) 32G X 6 MM MISC [id number] Yes Used to give insulin injections 4 times daily. Burnard Hawthorne, FNP Taking Active   lactulose [facility]) 10 GM/15ML solution [id number] Yes Take 15 mLs (10 g total) by mouth 2 (two) times daily as needed for moderate constipation or severe constipation. Earlie Server, MD Taking Active   Lancets ([place]) lancets [id number] Yes Used to check blood sugars twice daily. Burnard Hawthorne, FNP Taking Active   LANTUS SOLOSTAR 100 UNIT/ML Solostar Pen [id number] Yes INJECT SUBCUTANEOUSLY 50  UNITS DAILY  Patient taking differently: Inject 50 Units into the skin.   Burnard Hawthorne, FNP Taking Active            Med Note De Hollingshead   Wed Jun 12, 2020 10:44 AM)    lidocaine-prilocaine (EMLA) cream [id number] Yes Apply to affected area once Earlie Server, MD Taking Active   [name] [id number] Yes Take 5 mg by mouth at bedtime as needed.  [provider] Taking Active   metFORMIN (GLUCOPHAGE XR) 500 MG 24 hr tablet [id number] Yes Take 4 tablets (2,000 mg total) by mouth every evening. Burnard Hawthorne, FNP Taking Active   metoprolol succinate (TOPROL-XL) 100 MG 24 hr tablet [id number] Yes TAKE 1 TABLET BY MOUTH  DAILY WITH OR IMMEDIATELY  FOLLOWING A MEAL Caryl Bis Angela Adam, MD Taking Active   ondansetron (ZOFRAN ODT) 4 MG disintegrating tablet [id number] Yes Take 1 tablet (4 mg total) by mouth every 8 (eight) hours as needed for nausea or vomiting. Burnard Hawthorne, FNP Taking Active   ondansetron [facility]) 8 MG tablet [id number] Yes Take 1 tablet (8 mg total) by mouth 2 (two) times daily as needed for refractory nausea / vomiting. Start on day 3 after  chemotherapy. Earlie Server, MD Taking Active   polyethylene glycol powder [facility]) 17 GM/SCOOP powder [id number] Yes Take by mouth daily. 1/2 capful QD [provider] Taking Active   potassium chloride (KLOR-CON) 10 MEQ tablet [id number] Yes Take 1 tablet (10 mEq total) by mouth daily. Earlie Server, MD Taking Active   prochlorperazine (COMPAZINE) 10 MG tablet [id number] Yes Take 1 tablet (10 mg total) by mouth every 6 (six) hours as needed (Nausea or vomiting). Earlie Server, MD Taking Active   senna (SENOKOT) 8.6 MG TABS tablet [id number] Yes Take 2 tablets (17.2 mg total) by mouth daily. Earlie Server, MD Taking Active   tamsulosin [facility]) 0.4 MG CAPS capsule [id number] Yes Take 1 capsule (0.4 mg total) by mouth  daily. Stoioff, Ronda Fairly, MD Taking Active   telmisartan (MICARDIS) 40 MG tablet [id number] Yes Take 1 tablet (40 mg total) by mouth daily. PLEASE CALL OFFICE TO SCHEDULE FOLLOW UP APPOINTMENT FOR ANYMORE REFILLS. Loel Dubonnet, NP Taking Active           Patient Active Problem List   Diagnosis Date Noted  . Tachycardia 09/11/2020  . Nausea and vomiting 09/11/2020  . Genetic testing 07/23/2020  . Family history of esophageal cancer   . Family history of prostate cancer   . Insomnia 06/14/2020  . Neuropathy due to chemotherapeutic drug ([place]) 06/11/2020  . Uncontrolled diabetes mellitus ([place]) 06/11/2020  . Encounter for antineoplastic chemotherapy 06/03/2020  . 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 Rectal cancer metastasized to liver ([place]) 05/27/2020  . Goals of care, counseling/discussion 05/19/2020  . Dysuria 05/07/2020  . Acute prostatitis 05/07/2020  . Liver disease 05/02/2020  . Bloating 03/13/2020  . GERD (gastroesophageal reflux disease)   . Hypertriglyceridemia   . Hepatic steatosis 08/14/2019  . Unstable angina ([place]) 08/11/2019  . H/O supraventricular tachycardia 08/11/2019  . Morbid obesity ([place]) 08/11/2019  . COVID-19 07/18/2019  . Type 2  diabetes mellitus without complication, with long-term current use of insulin ([place]) 02/27/2019  . SVT (supraventricular tachycardia) ([place]) 02/27/2019  . Uncontrolled hypertension 02/27/2019  . Psoriatic arthritis ([place]) 02/27/2019    Conditions to be addressed/monitored: HTN and DMII  Care Plan : Medication Management  Updates made by De Hollingshead, RPH-CPP since 09/17/2020 12:00 AM    Problem: Diabetes, Cancer, HTN w/ SVT     Long-Range Goal: Disease Progression Prevention   Recent Progress: On track  Priority: High  Note:   Current Barriers:  . Unable to independently afford treatment regimen . Unable to achieve control of diabetes  . Complex patient with recent cancer diagnosis  Pharmacist Clinical Goal(s):  Marland Kitchen Over the next 90 days, patient will verbalize ability to afford treatment regimen. . Over the next 90 days, patient will adhere to prescribed medication regimen  Interventions: . 1:1 collaboration with Burnard Hawthorne, FNP regarding development and update of comprehensive plan of care as evidenced by provider attestation and co-signature . Inter-disciplinary care team collaboration (see longitudinal plan of care) . Comprehensive medication review performed; medication list updated in electronic medical record  Diabetes: . Uncontrolled; current treatment: metformin XR 500 mg QAM, 1500 mg QPM, Lantus 50 units daily, Humalog 10 units with breakfast, 15 units with other meals meals.  . Noted to my scheduler today that Tyler Aas was not covered by his insurance.  . Current glucose readings: back on CGM, just placed new sensor. Reports a reading of 208 just now.  . Encouraged to continue current regimen at this time. F/u with me scheduled next week, will have CGM data to review to determine insulin dose adjustment recommendations to make.   Hypertension w/ SVT . Appropriately controlled given comorbidities; current treatment: amlodipine 10 mg, HCTZ 25 mg daily, metoprolol  succinate 100 mg daily, telmisartan 40 mg daily   . Encouraged to continue current therapy at this time and collaboration with cardiology  Malignancy: . Appointment tomorrow. Symptomatic management includes: alprazolam 0.25-0.5 mg PRN anxiety; gabapentin 100 mg TID, lactulose 10 g BID PRN severe constipation, ondansetron ODT PRN nausea, potassium 10 mEq daily, prochlorperazine 10 mg PRN nausea, senna 8.6 mg PRN moderate constipation; follows w/ Dr. Tasia Catchings . Continue current collaboration with cardiology at this time  BPH: . Controlled per patient report; current regimen:  tamsulosin 0.4 mg daily; follows w/ Dr. Diamantina Providence . Continue current regimen along with urology collaboration at this time  Patient Goals/Self-Care Activities . Over the next 90 days, patient will:  - take medications as prescribed check glucose at least three times daily using CGM, document, and provide at future appointments Collaborate with interdisciplinary team   Follow Up Plan: Telephone follow up appointment with care management team member scheduled for: ~1 week as previously scheduled      Medication Assistance:  None required.  Patient affirms current coverage meets needs.  Follow Up:  Patient agrees to Care Plan and Follow-up.  Plan: Telephone follow up appointment with care management team member scheduled for:  ~ 1 week as previously scheduled  Catie Darnelle Maffucci, PharmD, [place], CPP Clinical Pharmacist [employer] [facility] [place] I have collaborated with the care management provider regarding care management and care coordination activities outlined in this encounter and have reviewed this encounter including documentation in the note and care plan. I am certifying that I agree with the content of this note and encounter as primary care provider.   Mable Paris, NP

## 2020-09-17 NOTE — Telephone Encounter (Signed)
-----   Message from Vanice Sarah, [place] sent at 09/17/2020  9:51 AM EST ----- Please add #new# lanreotide as MD recommends.   ----- Message ----- From: Earlie Server, MD Sent: 09/17/2020   7:53 AM EST To: Evelina Dun, RN, Vanice Sarah, CMA  Please add lanreotide injection to his next visit.

## 2020-09-17 NOTE — Telephone Encounter (Signed)
Dr. Tasia Catchings, Lanreotide not approved for tomorrow.

## 2020-09-18 ENCOUNTER — Encounter: Payer: Self-pay | Admitting: Oncology

## 2020-09-18 ENCOUNTER — Inpatient Hospital Stay (HOSPITAL_BASED_OUTPATIENT_CLINIC_OR_DEPARTMENT_OTHER): Payer: Managed Care, Other (non HMO) | Admitting: Oncology

## 2020-09-18 ENCOUNTER — Inpatient Hospital Stay: Payer: Managed Care, Other (non HMO)

## 2020-09-18 VITALS — BP 107/89 | HR 88 | Temp 98.4°F | Resp 18 | Wt 238.9 lb

## 2020-09-18 VITALS — BP 123/71 | HR 80 | Temp 97.4°F | Resp 16

## 2020-09-18 DIAGNOSIS — Z5112 Encounter for antineoplastic immunotherapy: Secondary | ICD-10-CM | POA: Diagnosis not present

## 2020-09-18 DIAGNOSIS — C787 Secondary malignant neoplasm of liver and intrahepatic bile duct: Secondary | ICD-10-CM

## 2020-09-18 DIAGNOSIS — C20 Malignant neoplasm of rectum: Secondary | ICD-10-CM

## 2020-09-18 DIAGNOSIS — Z5111 Encounter for antineoplastic chemotherapy: Secondary | ICD-10-CM

## 2020-09-18 DIAGNOSIS — E1365 Other specified diabetes mellitus with hyperglycemia: Secondary | ICD-10-CM

## 2020-09-18 DIAGNOSIS — T8090XA Unspecified complication following infusion and therapeutic injection, initial encounter: Secondary | ICD-10-CM

## 2020-09-18 DIAGNOSIS — C7A8 Other malignant neuroendocrine tumors: Secondary | ICD-10-CM

## 2020-09-18 DIAGNOSIS — T451X5A Adverse effect of antineoplastic and immunosuppressive drugs, initial encounter: Secondary | ICD-10-CM

## 2020-09-18 DIAGNOSIS — G620 Drug-induced polyneuropathy: Secondary | ICD-10-CM

## 2020-09-18 DIAGNOSIS — C7951 Secondary malignant neoplasm of bone: Secondary | ICD-10-CM

## 2020-09-18 LAB — CBC WITH DIFFERENTIAL/PLATELET
Abs Immature Granulocytes: 0.07 10*3/uL (ref 0.00–0.07)
Basophils Absolute: 0.1 10*3/uL (ref 0.0–0.1)
Basophils Relative: 1 %
Eosinophils Absolute: 0.2 10*3/uL (ref 0.0–0.5)
Eosinophils Relative: 2 %
HCT: 40.3 % (ref 39.0–52.0)
Hemoglobin: 13.7 g/dL (ref 13.0–17.0)
Immature Granulocytes: 1 %
Lymphocytes Relative: 23 %
Lymphs Abs: 2 10*3/uL (ref 0.7–4.0)
MCH: 28.8 pg (ref 26.0–34.0)
MCHC: 34 g/dL (ref 30.0–36.0)
MCV: 84.8 fL (ref 80.0–100.0)
Monocytes Absolute: 1.2 10*3/uL — ABNORMAL HIGH (ref 0.1–1.0)
Monocytes Relative: 14 %
Neutro Abs: 5.2 10*3/uL (ref 1.7–7.7)
Neutrophils Relative %: 59 %
Platelets: 260 10*3/uL (ref 150–400)
RBC: 4.75 MIL/uL (ref 4.22–5.81)
RDW: 14.8 % (ref 11.5–15.5)
WBC: 8.7 10*3/uL (ref 4.0–10.5)
nRBC: 0 % (ref 0.0–0.2)

## 2020-09-18 LAB — COMPREHENSIVE METABOLIC PANEL
ALT: 18 U/L (ref 0–44)
AST: 29 U/L (ref 15–41)
Albumin: 3.2 g/dL — ABNORMAL LOW (ref 3.5–5.0)
Alkaline Phosphatase: 84 U/L (ref 38–126)
Anion gap: 11 (ref 5–15)
BUN: 9 mg/dL (ref 6–20)
CO2: 26 mmol/L (ref 22–32)
Calcium: 9 mg/dL (ref 8.9–10.3)
Chloride: 97 mmol/L — ABNORMAL LOW (ref 98–111)
Creatinine, Ser: 0.75 mg/dL (ref 0.61–1.24)
GFR, Estimated: >60 mL/min (ref >60)
Glucose, Bld: 158 mg/dL — ABNORMAL HIGH (ref 70–99)
Potassium: 3.9 mmol/L (ref 3.5–5.1)
Sodium: 134 mmol/L — ABNORMAL LOW (ref 135–145)
Total Bilirubin: 0.9 mg/dL (ref 0.3–1.2)
Total Protein: 7.5 g/dL (ref 6.5–8.1)

## 2020-09-18 MED ORDER — SODIUM CHLORIDE 0.9 % IV SOLN
Freq: Once | INTRAVENOUS | Status: AC
  Filled 2020-09-18: qty 250

## 2020-09-18 MED ORDER — SODIUM CHLORIDE 0.9% FLUSH
10.0000 mL | Freq: Once | INTRAVENOUS | Status: AC
  Administered 2020-09-18: 10 mL via INTRAVENOUS
  Filled 2020-09-18: qty 10

## 2020-09-18 MED ORDER — HEPARIN SOD (PORK) LOCK FLUSH 100 UNIT/ML IV SOLN
500.0000 [IU] | Freq: Once | INTRAVENOUS | Status: AC
  Filled 2020-09-18: qty 5

## 2020-09-18 MED ORDER — LEUCOVORIN CALCIUM INJECTION 350 MG
389.6000 mg/m2 | Freq: Once | INTRAVENOUS | Status: AC
  Administered 2020-09-18: 900 mg via INTRAVENOUS
  Filled 2020-09-18: qty 10

## 2020-09-18 MED ORDER — OXALIPLATIN CHEMO INJECTION 100 MG/20ML
86.5000 mg/m2 | Freq: Once | INTRAVENOUS | Status: AC
  Administered 2020-09-18: 200 mg via INTRAVENOUS
  Filled 2020-09-18: qty 40

## 2020-09-18 MED ORDER — SODIUM CHLORIDE 0.9 % IV SOLN
Freq: Once | INTRAVENOUS | Status: DC | PRN
  Filled 2020-09-18: qty 250

## 2020-09-18 MED ORDER — BEVACIZUMAB-BVZR CHEMO INJECTION 400 MG/16ML
5.0000 mg/kg | Freq: Once | INTRAVENOUS | Status: AC
Start: 2020-09-18 — End: 2020-09-18
  Administered 2020-09-18: 500 mg via INTRAVENOUS
  Filled 2020-09-18: qty 16

## 2020-09-18 MED ORDER — METHYLPREDNISOLONE SODIUM SUCC 125 MG IJ SOLR
125.0000 mg | Freq: Once | INTRAMUSCULAR | Status: AC | PRN
  Administered 2020-09-18: 125 mg via INTRAVENOUS

## 2020-09-18 MED ORDER — PALONOSETRON HCL INJECTION 0.25 MG/5ML
0.2500 mg | Freq: Once | INTRAVENOUS | Status: AC
  Administered 2020-09-18: 0.25 mg via INTRAVENOUS
  Filled 2020-09-18: qty 5

## 2020-09-18 MED ORDER — SODIUM CHLORIDE 0.9% FLUSH
10.0000 mL | Freq: Once | INTRAVENOUS | Status: AC
  Filled 2020-09-18: qty 10

## 2020-09-18 MED ORDER — DIPHENHYDRAMINE HCL 50 MG/ML IJ SOLN
50.0000 mg | Freq: Once | INTRAMUSCULAR | Status: AC | PRN
  Administered 2020-09-18: 25 mg via INTRAVENOUS

## 2020-09-18 MED ORDER — DEXTROSE 5 % IV SOLN
Freq: Once | INTRAVENOUS | Status: AC
  Filled 2020-09-18: qty 250

## 2020-09-18 MED ORDER — FAMOTIDINE IN NACL 20-0.9 MG/50ML-% IV SOLN
20.0000 mg | Freq: Once | INTRAVENOUS | Status: AC | PRN
  Administered 2020-09-18: 20 mg via INTRAVENOUS

## 2020-09-18 MED ORDER — FLUOROURACIL CHEMO INJECTION 2.5 GM/50ML
400.0000 mg/m2 | Freq: Once | INTRAVENOUS | Status: AC
  Administered 2020-09-18: 900 mg via INTRAVENOUS
  Filled 2020-09-18: qty 18

## 2020-09-18 MED ORDER — SODIUM CHLORIDE 0.9 % IV SOLN
10.0000 mg | Freq: Once | INTRAVENOUS | Status: AC
  Administered 2020-09-18: 10 mg via INTRAVENOUS
  Filled 2020-09-18: qty 10

## 2020-09-18 MED ORDER — SODIUM CHLORIDE 0.9 % IV SOLN
2400.0000 mg/m2 | INTRAVENOUS | Status: DC
  Administered 2020-09-18: 5550 mg via INTRAVENOUS
  Filled 2020-09-18: qty 111

## 2020-09-18 NOTE — Telephone Encounter (Signed)
Message received from [place] (ins auth dept) authorization for lanreotide for 1 treatment to be done in the clinic.  The rest will have to be received and set up through [place].  New authorization for tx plan had to be re-submitted to include Lanreotide.  If he is to receive further tx she will need new tx plan without Lanreotide to obtain a new authorization.

## 2020-09-18 NOTE — Progress Notes (Signed)
DISCONTINUE ON PATHWAY REGIMEN - Colorectal     A cycle is every 14 days:     Bevacizumab-xxxx      Oxaliplatin      Leucovorin      Fluorouracil      Fluorouracil   **Always confirm dose/schedule in your pharmacy ordering system**  REASON: Toxicities / Adverse Event PRIOR TREATMENT: NPIOP10: [id number] + Bevacizumab q14 Days TREATMENT RESPONSE: Progressive Disease (PD)  START ON PATHWAY REGIMEN - Colorectal     A cycle is every 14 days:     Bevacizumab-xxxx      Irinotecan      Leucovorin      Fluorouracil      Fluorouracil   **Always confirm dose/schedule in your pharmacy ordering system**  Patient Characteristics: Distant Metastases, Nonsurgical Candidate, KRAS/NRAS Mutation Positive/Unknown (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

## 2020-09-18 NOTE — Progress Notes (Signed)
Patient here for follow up. Pt reports discomfort to right abdominal/ flank area.

## 2020-09-18 NOTE — Progress Notes (Signed)
ON PATHWAY REGIMEN - Colorectal  No Change  Continue With Treatment as Ordered.  Original Decision Date/Time: 05/27/2020 12:43     A cycle is every 14 days:     Bevacizumab-xxxx      Oxaliplatin      Leucovorin      Fluorouracil      Fluorouracil   **Always confirm dose/schedule in your pharmacy ordering system**  Patient Characteristics: Distant Metastases, Nonsurgical Candidate, KRAS/NRAS Mutation Positive/Unknown (BRAF V600 Wild-Type/Unknown), Standard Cytotoxic Therapy, First Line Standard Cytotoxic Therapy, Bevacizumab Eligible, PS = 0,1 Tumor Location: Rectal Therapeutic Status: Distant Metastases Microsatellite/Mismatch Repair Status: Unknown BRAF Mutation Status: Awaiting Test Results KRAS/NRAS Mutation Status: Awaiting Test Results Standard Cytotoxic Line of Therapy: First Line Standard Cytotoxic Therapy ECOG Performance Status: 1 Bevacizumab Eligibility: Eligible Intent of Therapy: Non-Curative / Palliative Intent, Discussed with Patient

## 2020-09-18 NOTE — Progress Notes (Signed)
1110: Patient came back from bathroom. Noticed that patient looked flushed and clammy. Patient stated he "felt like he was going to pass out in the bathroom". Reports increased nausea, increased "saliva in mouth", abdominal pain, and dizziness. Chemo stopped immediately. [id number] Baller, NP contacted. 1112:  1L IVF started. BP 103/56, HR 84, O2 96% RA. 1115: Benadryl 25 MG Given 1116: Solu-Medrol 125 MG Given. Sonia Baller, NP at chairside. BP 122/74, HR 85, O2 95% RA. Patient states symptoms are still present but is "getting better". 1117: Per Sonia Baller, NP give Pepcid. Pepcid 20 MG started. BP 122/68, HR 84, O2 96% RA.  1120: Pepcid completed. Per patient slight dizziness and abdominal pain still present. Flushing has improved. Patient states he is feeling better.  1139: Per Sonia Baller, NP, do not proceed with Oxaliplatin. Patient to finish leucovorin, 500cc of IVF, and 5FU push and pump.   Patient tolerated Leucovorin and 5FU well. Patient denies S/S at this time. Encouraged patient to reach out to clinic should he have any questions or concerns. Educated patient on when to seek emergency care. Patient verbalizes understanding and denies any further questions or concerns.

## 2020-09-18 NOTE — Progress Notes (Signed)
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  male with PMH listed below was seen in consultation at the request of  Burnard Hawthorne, FNP  for evaluation of liver and pancreatic mass  #reports symptoms of upper abdomen band like discomfort and bloating for a few months, improved symptoms with omeprazole,.Chronic constipation, he has blood in the stool occationally Seen by PCP in September 2021. AlK phosphatase was recently noted to be elevated as well well elevated GGT. He was referred to see gastroenteralgias on 04/10/2020.  04/25/2020 EGD showed gastric fundus mild inflammation. Biopsy showed reactive gastropathy.  04/30/2020 [place] RUQ showed cirrhosis with multiple hepatic masses.  05/01/2020 AFP 1.7 05/02/2020 MR liver w wo contrast showed numerous heterozygous enhancing liver lesions, throughout the liver, not typical for [facility], likely metastatic disease.  Solid appearing enhancing lesion in pancreatic tail 1.9x1.5cm, suspicious for primary neoplasm.  Mild adenopathy within porta hepatic region and porta hepatic region. Splenomegaly.   Patient has history of SVT, psoriatic arthritis, morbid obesity, GERD, DM.  Family history + maternal uncle with esophageal cancer.   + unintentional weight loss, no fever, chills. "sweats a lot". Currently on antibiotics for proctitis.  He works at [employer], lives in [place]. He lives with his partner.    # 05/17/2020, colonoscopy showed a  fungating and infiltrative partially obstructing large mass in the proximal rectum and in the mid rectum.  The mass was partially circumferential, involving two thirds of the lumen circumference is.  Biopsy showed adenocarcinoma.  05/23/2020, patient underwent EUS biopsy of the pancreatic tail lesion.  Biopsy pathology showed well-differentiated neuroendocrine tumor.  Ki-67 is noncontributory due to insufficient tissue in the cell block.  Genetic testing is negative.   INTERVAL HISTORY Dominic [name] is a 50 y.o. 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 Problems and complaints are listed below: Patient reports right upper quadrant pain. Blood sugar was low this morning in the 60s. Palpitation has resolved.  No shortness of breath, chest pain, nausea vomiting. Appetite is fair.  He has gained 4 pounds since last visit.    Review of Systems  Constitutional: Positive for fatigue and unexpected weight change. Negative for appetite change, chills and fever.  HENT:   Negative for hearing loss and voice change.   Eyes: Negative for eye problems and icterus.  Respiratory: Negative for chest tightness, cough and shortness of breath.   Cardiovascular: Negative for chest pain, leg swelling and palpitations.  Gastrointestinal: Negative for abdominal distention, abdominal pain and nausea.  Endocrine: Negative for hot flashes.  Genitourinary: Negative for difficulty urinating, dysuria and frequency.   Musculoskeletal: Negative for arthralgias.  Skin: Negative for itching and rash.  Neurological: Positive for numbness. Negative for light-headedness.  Hematological: Negative for adenopathy. Does not bruise/bleed easily.  Psychiatric/Behavioral: Negative for confusion.    MEDICAL HISTORY:  Past Medical History:  Diagnosis Date  . Anxiety   . Cancer [facility])    Rectal cancer  metastasized to  liver ([place])  . Diabetes mellitus without complication ([place])   . Dysrhythmia    svt  . Family history of esophageal cancer   . Family history of prostate cancer   . GERD (gastroesophageal reflux disease)   . High triglycerides   . Hypertension   . Long-term insulin use in type 2 diabetes ([place])   . Morbid obesity ([place])   . Psoriatic arthritis ([place])   . SVT (supraventricular tachycardia) ([place])     SURGICAL HISTORY: Past Surgical History:  Procedure Laterality Date  . EUS N/A 05/23/2020   Procedure: FULL UPPER ENDOSCOPIC ULTRASOUND (EUS) RADIAL;  Surgeon: Jola Schmidt, MD;  Location: [facility] ENDOSCOPY;  Service: Endoscopy;  Laterality: N/A;  . PORTA CATH INSERTION N/A 05/30/2020   Procedure: PORTA CATH INSERTION;  Surgeon: Algernon Huxley, MD;  Location: [place] CV LAB;  Service: Cardiovascular;  Laterality: N/A;  . UPPER GASTROINTESTINAL ENDOSCOPY      SOCIAL HISTORY: Social History   Socioeconomic History  . Marital status: Soil scientist    Spouse name: Marc Morgans  . Number of children: Not on file  . Years of education: Not on file  . Highest education level: Not on file  Occupational History  . Not on file  Tobacco Use  . Smoking status: Never Smoker  . Smokeless tobacco: Never Used  Vaping Use  . Vaping Use: Never used  Substance and Sexual Activity  . Alcohol use: Yes    Alcohol/week: 1.0 standard drink    Types: 1 Glasses of wine per week    Comment: very occasional  . Drug use: Never  . Sexual activity: Not on file  Other Topics Concern  . Not on file  Social History Narrative   Lives in [place] partner [place].  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     Social Determinants of Health   Financial Resource Strain: Medium Risk  . Difficulty of Paying Living Expenses: Somewhat hard  Food Insecurity: Not on file  Transportation Needs: Not on file  Physical Activity: Not on file  Stress: Not on file  Social Connections: Not on file  Intimate Partner  Violence: Not on file    FAMILY HISTORY: Family History  Problem Relation Age of Onset  . Arthritis Mother   . Heart disease Mother   . Supraventricular tachycardia Mother        s/p ablation  . Hypertension Mother   . Diabetes Father   . Heart attack Maternal Uncle   . Throat cancer Maternal Uncle   . Esophageal cancer Maternal Uncle   . Heart attack Maternal Uncle   . Heart attack Maternal Uncle   . Prostate cancer Maternal Uncle   . Thyroid cancer Neg Hx   . Colon cancer Neg Hx   . Rectal cancer Neg Hx   . Stomach cancer Neg Hx   . Colon polyps Neg Hx     ALLERGIES:  has No Known Allergies.  MEDICATIONS:  Current Outpatient Medications  Medication Sig Dispense Refill  . ALPRAZolam (XANAX) 0.5 MG tablet Take half tablet to one tablet by mouth as needed daily for insomnia, anxiety. 30 tablet 0  . amLODipine (NORVASC) 10 MG tablet TAKE 1 TABLET BY MOUTH  DAILY 90 tablet 3  . Continuous Blood Gluc Sensor (FREESTYLE LIBRE 2 SENSOR) MISC Scan glucose at least QID 2 each 11  . CONTOUR NEXT TEST test strip USE 1 STRIP TO TEST BLOOD  SUGAR TWICE DAILY 200 strip 3  . famotidine (PEPCID) 20 MG  tablet Take 1 tablet (20 mg total) by mouth 2 (two) times daily. 120 tablet 5  . gabapentin (NEURONTIN) 100 MG capsule Take 1 capsule (100 mg total) by mouth 3 (three) times daily. 90 capsule 0  . hydrochlorothiazide (HYDRODIURIL) 25 MG tablet Take 25 mg by mouth daily.    . insulin lispro (HUMALOG KWIKPEN) 100 UNIT/ML KwikPen Inject 10 units into the skin 10 minutes prior to breakfast lunch & dinner. Hold if glucose is less than 150. 15 mL 5  . Insulin Pen Needle (PEN NEEDLES) 32G X 6 MM MISC Used to give insulin injections 4 times daily. 100 each 10  . lactulose (CHRONULAC) 10 GM/15ML solution Take 15 mLs (10 g total) by mouth 2 (two) times daily as needed for moderate constipation or severe constipation. 236 mL 0  . Lancets (ONETOUCH ULTRASOFT) lancets Used to check blood sugars twice daily.  100 each 12  . lidocaine-prilocaine (EMLA) cream Apply to affected area once 30 g 3  . [name] Take 5 mg by mouth at bedtime as needed.     . metFORMIN (GLUCOPHAGE XR) 500 MG 24 hr tablet Take 4 tablets (2,000 mg total) by mouth every evening. 120 tablet 3  . metoprolol succinate (TOPROL-XL) 100 MG 24 hr tablet TAKE 1 TABLET BY MOUTH  DAILY WITH OR IMMEDIATELY  FOLLOWING A MEAL 90 tablet 3  . ondansetron (ZOFRAN) 8 MG tablet Take 1 tablet (8 mg total) by mouth 2 (two) times daily as needed for refractory nausea / vomiting. Start on day 3 after chemotherapy. 30 tablet 1  . polyethylene glycol powder (GLYCOLAX/MIRALAX) 17 GM/SCOOP powder Take by mouth daily. 1/2 capful QD    . potassium chloride (KLOR-CON) 10 MEQ tablet Take 1 tablet (10 mEq total) by mouth daily. 30 tablet 0  . prochlorperazine (COMPAZINE) 10 MG tablet Take 1 tablet (10 mg total) by mouth every 6 (six) hours as needed (Nausea or vomiting). 30 tablet 1  . senna (SENOKOT) 8.6 MG TABS tablet Take 2 tablets (17.2 mg total) by mouth daily. 120 tablet 0  . telmisartan (MICARDIS) 40 MG tablet Take 1 tablet (40 mg total) by mouth daily. PLEASE CALL OFFICE TO SCHEDULE FOLLOW UP APPOINTMENT FOR REFILLS. 30 tablet 1  . insulin degludec (TRESIBA FLEXTOUCH) 200 UNIT/ML FlexTouch Pen Inject 50 Units into the skin every evening. (Patient not taking: Reported on 09/18/2020) 6 mL 3  . ondansetron (ZOFRAN ODT) 4 MG disintegrating tablet Take 1 tablet (4 mg total) by mouth every 8 (eight) hours as needed for nausea or vomiting. (Patient not taking: Reported on 09/18/2020) 30 tablet 2  . tamsulosin (FLOMAX) 0.4 MG CAPS capsule Take 1 capsule (0.4 mg total) by mouth daily. (Patient not taking: Reported on 09/18/2020) 30 capsule 1   Current Facility-Administered Medications  Medication Dose Route Frequency Provider Last Rate Last Admin  . 0.9 %  sodium chloride infusion  500 mL Intravenous Once Nelida Meuse III, MD         PHYSICAL EXAMINATION: ECOG  PERFORMANCE STATUS: 1 - Symptomatic but completely ambulatory Vitals:   09/18/20 0842  BP: 107/89  Pulse: 88  Resp: 18  Temp: 98.4 F (36.9 C)   Filed Weights   09/18/20 0842  Weight: 238 lb 14.4 oz (108.4 kg)    Physical Exam Constitutional:      General: He is not in acute distress. HENT:     Head: Normocephalic and atraumatic.  Eyes:     General: No scleral icterus. Cardiovascular:  Rate and Rhythm: Regular rhythm. Tachycardia present.     Heart sounds: Normal heart sounds.  Pulmonary:     Effort: Pulmonary effort is normal. No respiratory distress.     Breath sounds: No wheezing.  Abdominal:     General: Bowel sounds are normal. There is no distension.     Palpations: Abdomen is soft.  Musculoskeletal:        General: No deformity. Normal range of motion.     Cervical back: Normal range of motion and neck supple.  Skin:    General: Skin is warm and dry.     Findings: No erythema or rash.  Neurological:     Mental Status: He is alert and oriented to person, place, and time. Mental status is at baseline.     Cranial Nerves: No cranial nerve deficit.     Coordination: Coordination normal.  Psychiatric:        Mood and Affect: Mood normal.     LABORATORY DATA:  I have reviewed the data as listed Lab Results  Component Value Date   WBC 8.7 09/18/2020   HGB 13.7 09/18/2020   HCT 40.3 09/18/2020   MCV 84.8 09/18/2020   PLT 260 09/18/2020   Recent Labs    04/10/20 1021 04/25/20 0942 05/07/20 0933 06/03/20 0815 06/21/20 0908 06/28/20 1019 08/28/20 0816 09/11/20 0820 09/18/20 0800  NA  --   --  136   < > 135   < > 132* 129* 134*  K  --   --  4.4   < > 4.8   < > 3.5 4.1 3.9  CL  --   --  98   < > 95*   < > 92* 94* 97*  CO2  --   --  21   < > 22   < > 27 21* 26  GLUCOSE  --   --  175*   < > 202*   < > 332* 404* 158*  BUN 18  --  12   < > 11   < > _0 CREATININE 0.91  --  0.86   < > 0.80   < > 0.83 1.03 0.75  CALCIUM  --   --  9.2   < > 9.2   <  > 9.3 9.2 9.0  GFRNONAA 98  --  101   < > 104   < > >60 >60 >60  GFRAA 113  --  117  --  120  --   --   --   --   PROT  --  6.5  --    < >  --    < > 7.9 7.6 7.5  ALBUMIN  --  3.1*  --    < >  --    < > 3.9 3.8 3.2*  AST  --  18  --    < >  --    < > _1 ALT  --  17  --    < >  --    < > _2 ALKPHOS  --  215*  --    < >  --    < > 92 100 84  BILITOT  --  0.7  --    < >  --    < > 1.2 1.5* 0.9  BILIDIR  --  0.47*  --   --   --   --   --   --   --    < > =  values in this interval not displayed.   Iron/TIBC/Ferritin/ %Sat No results found for: IRON, TIBC, FERRITIN, IRONPCTSAT    RADIOGRAPHIC STUDIES: I have personally reviewed the radiological images as listed and agreed with the findings in the report. NM PET (NETSPOT GA 55 DOTATATE) SKULL BASE TO MID THIGH  Result Date: 08/21/2020 CLINICAL DATA:  Neuroendocrine tumor of the pancreas. Colorectal carcinoma liver metastasis. Rectal carcinoma. EXAM: NUCLEAR MEDICINE PET SKULL BASE TO THIGH TECHNIQUE: 5.6 mCi gallium 68 DOTATATE was injected intravenously. Full-ring PET imaging was performed from the skull base to thigh after the radiotracer. CT data was obtained and used for attenuation correction and anatomic localization. COMPARISON:  CT 07/18/2020, MRI 05/02/2020 FINDINGS: NECK No radiotracer activity in neck lymph nodes. Incidental CT findings: None CHEST No radiotracer accumulation within mediastinal or hilar lymph nodes. No suspicious pulmonary nodules on the CT scan. Incidental CT finding:None ABDOMEN/PELVIS Lesion in the mid pancreas measuring approximately 3 cm (image 153) has intense radiotracer activity SUV max equal 15.5. Lesion difficult to define on noncontrast CT. There are large low-density lesions in the liver which do not accumulate radiotracer. These lesions have radiotracer activity less than background physiologic liver activity. There are no radiotracer avid retroperitoneal lymph nodes. No focal radiotracer avid  peritoneal lesions. No radiotracer avid lesions lesions the bowel. Physiologic activity noted in the liver, spleen, adrenal glands and kidneys. Incidental CT findings:No evidence of bowel obstruction. SKELETON No focal activity to suggest skeletal metastasis. Incidental CT findings:None IMPRESSION: 1. Intense radiotracer activity associated with mid pancreatic body lesion. Findings consistent well differentiated neuroendocrine tumor of the pancreas. 2. No evidence of metastatic well differentiated neuroendocrine tumor. 3. Multifocal liver metastasis without radiotracer activity consistent with colorectal adenocarcinoma metastasis. Electronically Signed   By: Suzy Bouchard M.D.   On: 08/21/2020 13:28      ASSESSMENT & PLAN:  1. Rectal cancer metastasized to liver ([place])   2. Encounter for antineoplastic chemotherapy   3. Neuroendocrine carcinoma of pancreas ([place])   4. Neuropathy due to chemotherapeutic drug ([place])   5. Uncontrolled other specified diabetes mellitus with hyperglycemia ([place])   6. Tachycardia   7. Infusion reaction, initial encounter   Cancer Staging Neuroendocrine carcinoma of pancreas ([place]) Staging form: Exocrine Pancreas, AJCC 8th Edition - Clinical: Stage IB (cT2, cN0, cM0) - Signed by Earlie Server, MD on 09/11/2020  Rectal cancer metastasized to liver [facility]) Staging form: Colon and Rectum, AJCC 8th Edition - Clinical stage from 06/03/2020: Stage Unknown (cTX, cNX, pM1) - Signed by Earlie Server, MD on 06/03/2020   #Stage IV rectal cancer On first-line palliative chemotherapy with FOLFOX and bevacizumab.   Labs are reviewed and are discussed with patient. CEA continues to increase. Proceed with cycle 8 FOLFOX/bevacizumab. Suspect that his cancer is primarily refractory to chemotherapy. Obtain CT chest abdomen pelvis with contrast.  -Patient had oxaliplatin reaction today.  Oxaliplatin was discontinued. Plan to switch to FOLFIRI/bevacizumab at the next cycle.   #SVT, patient  has history of SVT.  Advised patient to continue follow-up with cardiology.  #Pancreatic well differentiated neuroendocrine carcinoma, symptomatic.  Dotatate PET scan was reviewed and discussed with patient.  Lesion has grown in size now 3cm.  high CEA may also be partially from neuroendocrine carcinoma. Intermittent diarrhea which may be secondary to chemotherapy versus secondary to neuroendocrine carcinoma..I have communicated with radiation oncology Dr. Baruch Gouty who does not recommend radiation given his extensive liver metastasis from rectal cancer.Not surgical candidate due to rectal cancer.  Awaiting further characterization of this  tumor. Check serum glucogon level, gastrin,VIP  Consider lanreotide at next visit.  # Chemotherapy induced neuropathy. Grade 1  gabapentin 100 mg 3 times a day.  # Hypokalemia, K 4.1 , improved.  Continue potassium chloride 10 mEq daily.   # Uncontrolled diabetes,  continue follow-up with primary care provider for glycemic control.    All questions were answered. The patient knows to call the clinic with any problems questions or concerns.  cc Burnard Hawthorne, FNP    Return of visit: 2 weeks  Earlie Server, MD, PhD Hematology Oncology [facility] Pager- [phone] 09/18/2020

## 2020-09-19 ENCOUNTER — Telehealth: Payer: Self-pay

## 2020-09-19 LAB — GASTRIN: Gastrin: <10 pg/mL (ref 0–115)

## 2020-09-19 LAB — CEA: CEA: 947 ng/mL — ABNORMAL HIGH (ref 0.0–4.7)

## 2020-09-19 NOTE — Telephone Encounter (Signed)
Request for [facility] testing faxed to [facility] pathology.

## 2020-09-19 NOTE — Telephone Encounter (Signed)
-----   Message from Earlie Server, MD sent at 09/18/2020  8:45 PM EST ----- Please check if I have sent [facility]iseq for his rectal cancer specimen.  [phone]  Please send if I have not. Thanks

## 2020-09-20 ENCOUNTER — Inpatient Hospital Stay: Payer: Managed Care, Other (non HMO)

## 2020-09-20 DIAGNOSIS — Z5112 Encounter for antineoplastic immunotherapy: Secondary | ICD-10-CM | POA: Diagnosis not present

## 2020-09-20 DIAGNOSIS — C787 Secondary malignant neoplasm of liver and intrahepatic bile duct: Secondary | ICD-10-CM

## 2020-09-20 DIAGNOSIS — C20 Malignant neoplasm of rectum: Secondary | ICD-10-CM

## 2020-09-20 MED ORDER — HEPARIN SOD (PORK) LOCK FLUSH 100 UNIT/ML IV SOLN
INTRAVENOUS | Status: AC
  Filled 2020-09-20: qty 5

## 2020-09-20 MED ORDER — SODIUM CHLORIDE 0.9% FLUSH
10.0000 mL | INTRAVENOUS | Status: DC | PRN
  Administered 2020-09-20: 10 mL
  Filled 2020-09-20: qty 10

## 2020-09-20 MED ORDER — HEPARIN SOD (PORK) LOCK FLUSH 100 UNIT/ML IV SOLN
500.0000 [IU] | Freq: Once | INTRAVENOUS | Status: AC | PRN
  Administered 2020-09-20: 500 [IU]
  Filled 2020-09-20: qty 5

## 2020-09-23 ENCOUNTER — Ambulatory Visit: Payer: Managed Care, Other (non HMO) | Admitting: Pharmacist

## 2020-09-23 DIAGNOSIS — E119 Type 2 diabetes mellitus without complications: Secondary | ICD-10-CM

## 2020-09-23 DIAGNOSIS — Z794 Long term (current) use of insulin: Secondary | ICD-10-CM

## 2020-09-23 DIAGNOSIS — I10 Essential (primary) hypertension: Secondary | ICD-10-CM

## 2020-09-23 NOTE — Chronic Care Management (AMB) (Addendum)
Care Management   Pharmacy Note  09/23/2020 Name: Dominic Dominic [name] MRN: [id number] DOB: [birth year]  Subjective: Dominic Dominic [name] is Dominic [name] 50 y.o. year old male who is Dominic [name] primary care patient of Dominic Hawthorne, FNP. The Care Management team was consulted for assistance with care management and care coordination needs.    Engaged with patient by telephone for follow up visit in response to provider referral for pharmacy case management and/or care coordination services.   The patient was given information about Care Management services today including:  1. Care Management services includes personalized support from designated clinical staff supervised by the patient's primary care provider, including individualized plan of care and coordination with other care providers. 2. 24/7 contact phone numbers for assistance for urgent and routine care needs. 3. The patient may stop case management services at any time by phone call to the office staff.  Patient agreed to services and consent obtained.  Assessment:  Review of patient status, including review of consultants reports, laboratory and other test data, was performed as part of comprehensive evaluation and provision of chronic care management services.   SDOH (Social Determinants of Health) assessments and interventions performed:  SDOH Interventions   Flowsheet Row Most Recent Value  SDOH Interventions   Financial Strain Interventions Intervention Not Indicated       Objective:  Lab Results  Component Value Date   CREATININE 0.75 09/18/2020   CREATININE 1.03 09/11/2020   CREATININE 0.83 08/28/2020    Lab Results  Component Value Date   HGBA1C 8.3 (H) 06/21/2020       Component Value Date/Time   CHOL 226 (H) 03/07/2020 0902   TRIG 123 03/07/2020 0902   HDL 33 (L) 03/07/2020 0902   CHOLHDL 6.8 (H) 03/07/2020 0902   LDLCALC 170 (H) 03/07/2020 0902    Clinical ASCVD: No  The 10-year ASCVD risk score [name], et al.,  2013) is: 12.9%   Values used to calculate the score:     Age: 46 years     Sex: Male     Is Non-Hispanic African American: No     Diabetic: Yes     Tobacco smoker: No     Systolic Blood Pressure: 299 mmHg     Is BP treated: Yes     HDL Cholesterol: 33 mg/dL     Total Cholesterol: 226 mg/dL     BP Readings from Last 3 Encounters:  09/18/20 123/71  09/18/20 107/89  09/13/20 128/74    Care Plan  No Known Allergies  Medications Reviewed Today    Reviewed by Avie Arenas, Dominic Dominic [name] (Pharmacist) on 09/23/20 at [place] List Status: <None>  Medication Order Taking? Sig Documenting Provider Last Dose Status Informant  0.9 %  sodium chloride infusion [id number]   Doran Stabler, MD  Active   ALPRAZolam Duanne Moron) 0.5 MG tablet [id number] Yes Take half tablet to one tablet by mouth as needed daily for insomnia, anxiety. Dominic Hawthorne, FNP Taking Active   amLODipine (NORVASC) 10 MG tablet [id number] Yes TAKE 1 TABLET BY MOUTH  DAILY End, Harrell Gave, MD Taking Active   Continuous Blood Gluc Sensor (FREESTYLE LIBRE 2 SENSOR) [place] [id number]  Scan glucose at least QID Dominic Hawthorne, FNP  Active   CONTOUR NEXT TEST test strip [id number]  USE 1 STRIP TO TEST BLOOD  SUGAR TWICE DAILY Dominic Hawthorne, FNP  Active   famotidine (PEPCID) 20 MG tablet [id number] Yes Take 1 tablet (20 mg  total) by mouth 2 (two) times daily. Dominic Hawthorne, FNP Taking Active   gabapentin (NEURONTIN) 100 MG capsule [id number] No Take 1 capsule (100 mg total) by mouth 3 (three) times daily.  Patient not taking: Reported on 09/23/2020   Earlie Server, MD Not Taking Active   hydrochlorothiazide (HYDRODIURIL) 25 MG tablet [id number] Yes Take 25 mg by mouth daily. [provider] Taking Active   insulin degludec (TRESIBA FLEXTOUCH) 200 UNIT/ML FlexTouch Pen [id number] No Inject 50 Units into the skin every evening.  Patient not taking: No sig reported   Dominic Hawthorne, FNP Not Taking Active            Med Note  (Dominic Dominic [name] Dominic [name]   Mon Sep 23, 2020 11:18 AM) Taking Lantus 50 units daily  insulin lispro (HUMALOG KWIKPEN) 100 UNIT/ML KwikPen [id number] Yes Inject 10 units into the skin 10 minutes prior to breakfast lunch & dinner. Hold if glucose is less than 150. Dominic Hawthorne, FNP Taking Active            Med Note (Dominic Dominic [name] Dominic [name]   Mon Sep 23, 2020 11:18 AM) Taking 15 units TID  Insulin Pen Needle (PEN NEEDLES) 32G X 6 MM MISC [id number]  Used to give insulin injections 4 times daily. Dominic Hawthorne, FNP  Active   lactulose (CHRONULAC) 10 GM/15ML solution [id number] No Take 15 mLs (10 g total) by mouth 2 (two) times daily as needed for moderate constipation or severe constipation.  Patient not taking: Reported on 09/23/2020   Earlie Server, MD Not Taking Active   Lancets [facility], [name] ULTRASOFT) lancets [id number]  Used to check blood sugars twice daily. Dominic Hawthorne, FNP  Active   lidocaine-prilocaine (EMLA) cream [id number] Yes Apply to affected area once Earlie Server, MD Taking Active   [name] [id number] No Take 5 mg by mouth at bedtime as needed.   Patient not taking: Reported on 09/23/2020   [provider] Not Taking Active   metFORMIN (GLUCOPHAGE XR) 500 MG 24 hr tablet [id number] Yes Take 4 tablets (2,000 mg total) by mouth every evening. Dominic Hawthorne, FNP Taking Active   metoprolol succinate (TOPROL-XL) 100 MG 24 hr tablet [id number] Yes TAKE 1 TABLET BY MOUTH  DAILY WITH OR IMMEDIATELY  FOLLOWING Dominic [name] MEAL Dominic Bis Angela Adam, MD Taking Active   ondansetron (ZOFRAN ODT) 4 MG disintegrating tablet [id number] No Take 1 tablet (4 mg total) by mouth every 8 (eight) hours as needed for nausea or vomiting.  Patient not taking: No sig reported   Dominic Hawthorne, FNP Not Taking Active   ondansetron (ZOFRAN) 8 MG tablet [id number] Yes Take 1 tablet (8 mg total) by mouth 2 (two) times daily as needed for refractory nausea / vomiting. Start on day 3 after chemotherapy. Earlie Server, MD Taking Active    polyethylene glycol powder [facility]) 17 GM/SCOOP powder [id number] Yes Take by mouth daily. 1/2 capful QD [provider] Taking Active   potassium chloride (KLOR-CON) 10 MEQ tablet [id number] Yes Take 1 tablet (10 mEq total) by mouth daily. Earlie Server, MD Taking Active   prochlorperazine (COMPAZINE) 10 MG tablet [id number] No Take 1 tablet (10 mg total) by mouth every 6 (six) hours as needed (Nausea or vomiting).  Patient not taking: Reported on 09/23/2020   Earlie Server, MD Not Taking Active   senna (SENOKOT) 8.6 MG TABS tablet [id number] Yes Take 2 tablets (17.2 mg total) by mouth daily. Earlie Server, MD Taking Active   tamsulosin (  FLOMAX) 0.4 MG CAPS capsule [id number] No Take 1 capsule (0.4 mg total) by mouth daily.  Patient not taking: No sig reported   Stoioff, Ronda Fairly, MD Not Taking Active   telmisartan (MICARDIS) 40 MG tablet [id number] Yes Take 1 tablet (40 mg total) by mouth daily. PLEASE CALL OFFICE TO SCHEDULE FOLLOW UP APPOINTMENT FOR REFILLS. Loel Dubonnet, NP Taking Active           Patient Active Problem List   Diagnosis Date Noted  . Tachycardia 09/11/2020  . Nausea and vomiting 09/11/2020  . Genetic testing 07/23/2020  . Family history of esophageal cancer   . Family history of prostate cancer   . Insomnia 06/14/2020  . Neuropathy due to chemotherapeutic drug ([place]) 06/11/2020  . Uncontrolled diabetes mellitus ([place]) 06/11/2020  . Encounter for antineoplastic chemotherapy 06/03/2020  . 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 Rectal cancer metastasized to liver ([place]) 05/27/2020  . Goals of care, counseling/discussion 05/19/2020  . Dysuria 05/07/2020  . Acute prostatitis 05/07/2020  . Liver disease 05/02/2020  . Bloating 03/13/2020  . GERD (gastroesophageal reflux disease)   . Hypertriglyceridemia   . Hepatic steatosis 08/14/2019  . Unstable angina ([place]) 08/11/2019  . H/O supraventricular tachycardia 08/11/2019  . Morbid obesity ([place])  08/11/2019  . COVID-19 07/18/2019  . Type 2 diabetes mellitus without complication, with long-term current use of insulin ([place]) 02/27/2019  . SVT (supraventricular tachycardia) ([place]) 02/27/2019  . Uncontrolled hypertension 02/27/2019  . Psoriatic arthritis ([place]) 02/27/2019    Conditions to be addressed/monitored: HTN and DMII  Care Plan : Medication Management  Updates made by Dominic Dominic [name], Dominic Dominic [name] since 09/23/2020 12:00 AM    Problem: Diabetes, Cancer, HTN w/ SVT     Long-Range Goal: Disease Progression Prevention   This Visit's Progress: On track  Recent Progress: On track  Priority: High  Note:   Current Barriers:  . Unable to independently afford treatment regimen . Unable to achieve control of diabetes  . Complex patient with recent cancer diagnosis  Pharmacist Clinical Goal(s):  Marland Kitchen Over the next 90 days, patient will verbalize ability to afford treatment regimen. . Over the next 90 days, patient will adhere to prescribed medication regimen  Interventions: . 1:1 collaboration with Dominic Hawthorne, FNP regarding development and update of comprehensive plan of care as evidenced by provider attestation and co-signature . Inter-disciplinary care team collaboration (see longitudinal plan of care) . Comprehensive medication review performed; medication list updated in electronic medical record  Diabetes: . Uncontrolled; current treatment: metformin XR 500 mg QAM, 1500 mg QPM, Lantus 50 units daily, Humalog 15 units TID w/meals.  o [place] not covered by insurance  . Current glucose readings: CGM Libre - reports 222 this AM and last night up to 400. o Date of Download: September 10, 2020 - September 23, 2020 o % Time CGM is active: 38% o Average Glucose: 291 mg/dL o Glucose Management Indicator: 10.3% o Glucose Variability: 31.8% (goal <36%) o Time in Goal:  o - Time in range 70-180: 13% o - Time above range: 87% o - Time below range: 0% o Observed patterns: High throughout the day,  most likely secondary to chemotherapy supportive medications (IV steroids and dextrose) every 14 days. . Reports one asymptomatic hypoglycemic event on March 9th - BG 67 (not captured on CGM)  . Diet - Eating 2-3 meals/day. Occasional nausea. Indulges in sweets occassionally (chocolate covered almonds), carb-sensitive Recommend increasing Lantus to 56 units daily (~ 12% dose increase).  Will collaborate w/ PCP on this. If need to increase past 60 units daily, recommend seeing if Toujeo Max is covered, as it can deliver up to 160 units per injection (more concentrated pen).    Hypertension w/ SVT . Appropriately controlled given comorbidities; current treatment: amlodipine 10 mg, HCTZ 25 mg daily, metoprolol succinate 100 mg daily, telmisartan 40 mg daily   o Ran out of telmisartan for the last 3 days - expecting delivery from mail order pharmacy today . Encouraged to continue current therapy at this time and collaboration with cardiology  Malignancy: . Followed by Dr. Tasia Catchings. Symptomatic management includes: alprazolam 0.25-0.5 mg PRN anxiety; gabapentin 100 mg TID, lactulose 10 g BID PRN severe constipation, ondansetron ODT PRN nausea, potassium 10 mEq daily, prochlorperazine 10 mg PRN nausea, senna 8.6 mg PRN moderate constipation . Receives IV steroids and IV dextrose with each chemotherapy cycle . Continue current collaboration with cardiology at this time  BPH: . Controlled per patient report; current regimen: tamsulosin 0.4 mg daily (not taking); follows w/ Dr. Diamantina Providence . Continue current regimen along with urology collaboration at this time  Patient Goals/Self-Care Activities . Over the next 90 days, patient will:  - take medications as prescribed check glucose at least three times daily using CGM, document, and provide at future appointments Collaborate with interdisciplinary team   Follow Up Plan: Telephone follow up appointment with care management team member scheduled for: ~5 weeks       Medication Assistance:  None required.  Patient affirms current coverage meets needs.  Follow Up:  Patient agrees to Care Plan and Follow-up.  Plan: Telephone follow up appointment with care management team member scheduled for:  ~5 weeks  Dominic Dominic [name], PharmD, BCPS PGY2 [place]   I was present for this visit and agree with the documentation by the resident as above.   Dominic Dominic [name], PharmD, [place], CPP Clinical Pharmacist [place] at [facility] [phone]   Encounter details: CCM Time Spent      Value Time User   Total time (minutes)  0 09/23/2020 11:45 AM Dominic Dominic [name], Dominic Dominic [name]     Moderate to High Complex Decision Making    None     CCM Services: This encounter meets complex CCM services and moderate to high decision making.  Prior to outreach and patient consent for Chronic Care Management, I referred this patient for services after reviewing the nominated patient list or from Dominic [name] personal encounter with the patient.  I have personally reviewed this encounter including the documentation in this note and have collaborated with the care management provider regarding care management and care coordination activities to include development and update of the comprehensive care plan. I am certifying that I agree with the content of this note and encounter as supervising physician.

## 2020-09-23 NOTE — Progress Notes (Deleted)
Anything -- we will talk to margaret and get back to you  plan: fix med list - on Lantus Tyler Aas not covered);   CGM data;  eating/diet?

## 2020-09-23 NOTE — Patient Instructions (Signed)
  Visit Information  Goals Addressed              This Visit's Progress   .  Medication Monitoring (pt-stated)        Patient Goals/Self-Care Activities . Over the next 90 days, patient will:  - take medications as prescribed check glucose at least three times daily using CGM, document, and provide at future appointments -Collaborate with interdisciplinary team.        Patient verbalizes understanding of instructions provided today and agrees to view in MyChart.   Telephone follow up appointment with care management team member scheduled for: ~5 weeks  Asiah Browder, PharmD, BCPS PGY2 Ambulatory Care Resident [place]  [facility]   

## 2020-09-24 ENCOUNTER — Telehealth: Payer: Self-pay | Admitting: Family

## 2020-09-24 ENCOUNTER — Ambulatory Visit
Admission: RE | Admit: 2020-09-24 | Discharge: 2020-09-24 | Disposition: A | Discharge status: Home / Self Care | Payer: Managed Care, Other (non HMO) | Source: Ambulatory Visit | Attending: Oncology | Admitting: Oncology

## 2020-09-24 ENCOUNTER — Other Ambulatory Visit: Payer: Self-pay

## 2020-09-24 DIAGNOSIS — C20 Malignant neoplasm of rectum: Secondary | ICD-10-CM | POA: Insufficient documentation

## 2020-09-24 DIAGNOSIS — C787 Secondary malignant neoplasm of liver and intrahepatic bile duct: Secondary | ICD-10-CM | POA: Diagnosis present

## 2020-09-24 MED ORDER — IOHEXOL 300 MG/ML  SOLN
100.0000 mL | Freq: Once | INTRAMUSCULAR | Status: AC | PRN
  Administered 2020-09-24: 100 mL via INTRAVENOUS

## 2020-09-24 NOTE — Telephone Encounter (Signed)
I called patient & he was comfortable increasing to the 56 units of Lantus if needed. He said that was the only low that he'd had & he doesn't know how accurate it was. That was the reading he got when first using the [place] again. This reading of 67 was 4-5am he said. He did not check actual glucose at the time either. He said that the night before he had a lower carb meal & I advised on Humalog dosing. He said that most fastings are still in the 150 range.I told him not to increase yet & that I would be back in touch with him tomorrow.

## 2020-09-24 NOTE — Telephone Encounter (Signed)
-----   Message from De Hollingshead, [place] sent at 09/23/2020 12:08 PM EDT ----- Recommend increasing Lantus to 56 units daily (~ 12% dose increase). As per last note, Tyler Aas was not covered. If need to increase past 60 units daily, recommend seeing if Toujeo Max is covered, as it can deliver up to 160 units per injection (more concentrated pen)

## 2020-09-24 NOTE — Telephone Encounter (Signed)
Call pt  Consulted with Catie pharm D whom advised increase of lantus to 56 units daily based on blood glucose He is currently on metformin XR 500 mg QAM, 1500 mg QPM, Lantus 50 units daily, Humalog 15 units TID w/meals.   Before increasing, I am concerned as he reported a low asymptomatic hypoglycemic event on March 9th - BG 67 (not captured on CGM) .  Can you ask patient what time of day this occurred and when he last ate? Was low after Humalog with a meal? Was his meal lower carb?  Reiterate the following education around humalog:   Advised to hold if glucose is less than 150. HOLD humalog if  not eating or give half dose if low carb meal due to concern for hypoglycemia Please wear CGM as often as can   Please ensure he follow up with [place] in another 4-6 weeks.   I will send in increase dose of lantus after you speak with him

## 2020-09-25 LAB — VASOACTIVE INTESTINAL PEPTIDE (VIP): Vasoactive Intest Polypeptide: 35.6 pg/mL (ref 0.0–58.8)

## 2020-09-25 MED ORDER — LANTUS SOLOSTAR 100 UNIT/ML ~~LOC~~ SOPN: 56.0000 [IU] | PEN_INJECTOR | Freq: Every day | SUBCUTANEOUS | 3 refills | Status: DC

## 2020-09-25 NOTE — Telephone Encounter (Signed)
I called patient & advised on increase. Pt will keep check on blood sugars & let us know if any issues.

## 2020-09-25 NOTE — Telephone Encounter (Signed)
Call pt  I am comfortable with increasing  lantus to 56units and added lantus back to chart

## 2020-09-26 LAB — GLUCAGON: Glucagon Lvl: 147 pg/mL (ref 50–150)

## 2020-10-02 ENCOUNTER — Inpatient Hospital Stay (HOSPITAL_BASED_OUTPATIENT_CLINIC_OR_DEPARTMENT_OTHER): Payer: Managed Care, Other (non HMO) | Admitting: Oncology

## 2020-10-02 ENCOUNTER — Inpatient Hospital Stay: Payer: Managed Care, Other (non HMO)

## 2020-10-02 ENCOUNTER — Encounter: Payer: Self-pay | Admitting: Oncology

## 2020-10-02 VITALS — BP 144/93 | HR 83 | Temp 97.1°F | Resp 18 | Wt 238.2 lb

## 2020-10-02 DIAGNOSIS — C20 Malignant neoplasm of rectum: Secondary | ICD-10-CM

## 2020-10-02 DIAGNOSIS — C7A8 Other malignant neuroendocrine tumors: Secondary | ICD-10-CM | POA: Diagnosis not present

## 2020-10-02 DIAGNOSIS — R000 Tachycardia, unspecified: Secondary | ICD-10-CM

## 2020-10-02 DIAGNOSIS — Z5111 Encounter for antineoplastic chemotherapy: Secondary | ICD-10-CM | POA: Diagnosis not present

## 2020-10-02 DIAGNOSIS — C7951 Secondary malignant neoplasm of bone: Secondary | ICD-10-CM

## 2020-10-02 DIAGNOSIS — G620 Drug-induced polyneuropathy: Secondary | ICD-10-CM | POA: Diagnosis not present

## 2020-10-02 DIAGNOSIS — C787 Secondary malignant neoplasm of liver and intrahepatic bile duct: Secondary | ICD-10-CM

## 2020-10-02 DIAGNOSIS — T451X5A Adverse effect of antineoplastic and immunosuppressive drugs, initial encounter: Secondary | ICD-10-CM

## 2020-10-02 DIAGNOSIS — Z5112 Encounter for antineoplastic immunotherapy: Secondary | ICD-10-CM | POA: Diagnosis not present

## 2020-10-02 LAB — CBC WITH DIFFERENTIAL/PLATELET
Abs Immature Granulocytes: 0.01 10*3/uL (ref 0.00–0.07)
Basophils Absolute: 0 10*3/uL (ref 0.0–0.1)
Basophils Relative: 1 %
Eosinophils Absolute: 0.2 10*3/uL (ref 0.0–0.5)
Eosinophils Relative: 4 %
HCT: 40.4 % (ref 39.0–52.0)
Hemoglobin: 13.7 g/dL (ref 13.0–17.0)
Immature Granulocytes: 0 %
Lymphocytes Relative: 33 %
Lymphs Abs: 1.8 10*3/uL (ref 0.7–4.0)
MCH: 28.9 pg (ref 26.0–34.0)
MCHC: 33.9 g/dL (ref 30.0–36.0)
MCV: 85.2 fL (ref 80.0–100.0)
Monocytes Absolute: 0.7 10*3/uL (ref 0.1–1.0)
Monocytes Relative: 13 %
Neutro Abs: 2.7 10*3/uL (ref 1.7–7.7)
Neutrophils Relative %: 49 %
Platelets: 177 10*3/uL (ref 150–400)
RBC: 4.74 MIL/uL (ref 4.22–5.81)
RDW: 14.6 % (ref 11.5–15.5)
WBC: 5.4 10*3/uL (ref 4.0–10.5)
nRBC: 0 % (ref 0.0–0.2)

## 2020-10-02 LAB — COMPREHENSIVE METABOLIC PANEL
ALT: 13 U/L (ref 0–44)
AST: 27 U/L (ref 15–41)
Albumin: 3.6 g/dL (ref 3.5–5.0)
Alkaline Phosphatase: 99 U/L (ref 38–126)
Anion gap: 9 (ref 5–15)
BUN: 14 mg/dL (ref 6–20)
CO2: 27 mmol/L (ref 22–32)
Calcium: 8.8 mg/dL — ABNORMAL LOW (ref 8.9–10.3)
Chloride: 97 mmol/L — ABNORMAL LOW (ref 98–111)
Creatinine, Ser: 0.68 mg/dL (ref 0.61–1.24)
GFR, Estimated: >60 mL/min (ref >60)
Glucose, Bld: 167 mg/dL — ABNORMAL HIGH (ref 70–99)
Potassium: 3.5 mmol/L (ref 3.5–5.1)
Sodium: 133 mmol/L — ABNORMAL LOW (ref 135–145)
Total Bilirubin: 1 mg/dL (ref 0.3–1.2)
Total Protein: 7.2 g/dL (ref 6.5–8.1)

## 2020-10-02 MED ORDER — SODIUM CHLORIDE 0.9% FLUSH
10.0000 mL | INTRAVENOUS | Status: DC | PRN
  Filled 2020-10-02: qty 10

## 2020-10-02 MED ORDER — SODIUM CHLORIDE 0.9 % IV SOLN
300.0000 mg | Freq: Once | INTRAVENOUS | Status: AC
  Administered 2020-10-02: 300 mg via INTRAVENOUS
  Filled 2020-10-02: qty 15

## 2020-10-02 MED ORDER — SODIUM CHLORIDE 0.9 % IV SOLN
2400.0000 mg/m2 | INTRAVENOUS | Status: DC
  Administered 2020-10-02: 5500 mg via INTRAVENOUS
  Filled 2020-10-02: qty 110

## 2020-10-02 MED ORDER — ASPIRIN EC 81 MG PO TBEC: 81.0000 mg | DELAYED_RELEASE_TABLET | Freq: Every day | ORAL | 11 refills | Status: AC

## 2020-10-02 MED ORDER — SODIUM CHLORIDE 0.9 % IV SOLN
10.0000 mg | Freq: Once | INTRAVENOUS | Status: AC
  Administered 2020-10-02: 10 mg via INTRAVENOUS
  Filled 2020-10-02: qty 10

## 2020-10-02 MED ORDER — FLUOROURACIL CHEMO INJECTION 2.5 GM/50ML
400.0000 mg/m2 | Freq: Once | INTRAVENOUS | Status: AC
  Administered 2020-10-02: 900 mg via INTRAVENOUS
  Filled 2020-10-02: qty 18

## 2020-10-02 MED ORDER — SODIUM CHLORIDE 0.9 % IV SOLN
Freq: Once | INTRAVENOUS | Status: AC
  Filled 2020-10-02: qty 250

## 2020-10-02 MED ORDER — SODIUM CHLORIDE 0.9% FLUSH
10.0000 mL | INTRAVENOUS | Status: DC | PRN
  Administered 2020-10-02: 10 mL via INTRAVENOUS
  Filled 2020-10-02: qty 10

## 2020-10-02 MED ORDER — LOPERAMIDE HCL 2 MG PO TABS: 2.0000 mg | ORAL_TABLET | ORAL | 1 refills | Status: AC

## 2020-10-02 MED ORDER — PALONOSETRON HCL INJECTION 0.25 MG/5ML
0.2500 mg | Freq: Once | INTRAVENOUS | Status: AC
  Administered 2020-10-02: 0.25 mg via INTRAVENOUS
  Filled 2020-10-02: qty 5

## 2020-10-02 MED ORDER — SODIUM CHLORIDE 0.9 % IV SOLN
900.0000 mg | Freq: Once | INTRAVENOUS | Status: AC
  Administered 2020-10-02: 900 mg via INTRAVENOUS
  Filled 2020-10-02: qty 45

## 2020-10-02 MED ORDER — ATROPINE SULFATE 1 MG/ML IJ SOLN
0.5000 mg | Freq: Once | INTRAMUSCULAR | Status: AC
  Administered 2020-10-02: 0.5 mg via INTRAVENOUS
  Filled 2020-10-02: qty 1

## 2020-10-02 MED ORDER — SODIUM CHLORIDE 0.9 % IV SOLN
5.0000 mg/kg | Freq: Once | INTRAVENOUS | Status: AC
  Administered 2020-10-02: 500 mg via INTRAVENOUS
  Filled 2020-10-02: qty 16

## 2020-10-02 NOTE — Progress Notes (Signed)
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  male with PMH listed below was seen in consultation at the request of  Burnard Hawthorne, FNP  for evaluation of liver and pancreatic mass  #reports symptoms of upper abdomen band like discomfort and bloating for a few months, improved symptoms with omeprazole,.Chronic constipation, he has blood in the stool occationally Seen by PCP in September 2021. AlK phosphatase was recently noted to be elevated as well well elevated GGT. He was referred to see gastroenteralgias on 04/10/2020.  04/25/2020 EGD showed gastric fundus mild inflammation. Biopsy showed reactive gastropathy.  04/30/2020 [place] RUQ showed cirrhosis with multiple hepatic masses.  05/01/2020 AFP 1.7 05/02/2020 MR liver w wo contrast showed numerous heterozygous enhancing liver lesions, throughout the liver, not typical for [facility], likely metastatic disease.  Solid appearing enhancing lesion in pancreatic tail 1.9x1.5cm, suspicious for primary neoplasm.  Mild adenopathy within porta hepatic region and porta hepatic region. Splenomegaly.   Patient has history of SVT, psoriatic arthritis, morbid obesity, GERD, DM.  Family history + maternal uncle with esophageal cancer.   + unintentional weight loss, no fever, chills. "sweats a lot". Currently on antibiotics for proctitis.  He works at [employer], lives in [place]. He lives with his partner.    # 05/17/2020, colonoscopy showed a  fungating and infiltrative partially obstructing large mass in the proximal rectum and in the mid rectum.  The mass was partially circumferential, involving two thirds of the lumen circumference is.  Biopsy showed adenocarcinoma.  05/23/2020, patient underwent EUS biopsy of the pancreatic tail lesion.  Biopsy pathology showed well-differentiated neuroendocrine tumor.  Ki-67 is noncontributory due to insufficient tissue in the cell block.  Genetic testing is negative.   INTERVAL HISTORY Dominic [name] is a 50 y.o. 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 Problems and complaints are listed below: Patient reports intermittent left upper quadrant pain.  No nausea vomiting. Appetite is fair.  He had a few loose bowel movement episodes 2 to 3 days ago and the symptoms have resolved. Appetite is fair.  Weight is stable. Stable neuropathy symptoms.  Review of Systems  Constitutional: Positive for fatigue. Negative for appetite change, chills, fever and unexpected weight change.  HENT:   Negative for hearing loss and voice change.   Eyes: Negative for eye problems and icterus.  Respiratory: Negative for chest tightness, cough and shortness of breath.   Cardiovascular: Negative for chest pain, leg swelling and palpitations.  Gastrointestinal: Negative for abdominal distention and nausea.  Endocrine: Negative for hot flashes.  Genitourinary: Negative for difficulty urinating, dysuria and frequency.   Musculoskeletal: Negative for arthralgias.  Skin: Negative for itching and rash.  Neurological: Positive for numbness. Negative for light-headedness.  Hematological: Negative for adenopathy. Does not bruise/bleed easily.  Psychiatric/Behavioral: Negative for confusion.    MEDICAL HISTORY:  Past Medical History:  Diagnosis Date  . Anxiety   . Cancer [facility])    Rectal cancer  metastasized to liver (  [place])  . Diabetes mellitus without complication ([place])   . Dysrhythmia    svt  . Family history of esophageal cancer   . Family history of prostate cancer   . GERD (gastroesophageal reflux disease)   . High triglycerides   . Hypertension   . Long-term insulin use in type 2 diabetes ([place])   . Morbid obesity ([place])   . Psoriatic arthritis ([place])   . SVT (supraventricular tachycardia) ([place])     SURGICAL HISTORY: Past Surgical History:  Procedure Laterality Date  . EUS N/A 05/23/2020   Procedure: FULL UPPER ENDOSCOPIC ULTRASOUND (EUS) RADIAL;  Surgeon: Jola Schmidt, MD;  Location: [facility] ENDOSCOPY;  Service: Endoscopy;  Laterality: N/A;  . PORTA CATH INSERTION N/A 05/30/2020   Procedure: PORTA CATH INSERTION;  Surgeon: Algernon Huxley, MD;  Location: [facility];  Service: Cardiovascular;  Laterality: N/A;  . UPPER GASTROINTESTINAL ENDOSCOPY      SOCIAL HISTORY: Social History   Socioeconomic History  . Marital status: Soil scientist    Spouse name: Marc Morgans  . Number of children: Not on file  . Years of education: Not on file  . Highest education level: Not on file  Occupational History  . Not on file  Tobacco Use  . Smoking status: Never Smoker  . Smokeless tobacco: Never Used  Vaping Use  . Vaping Use: Never used  Substance and Sexual Activity  . Alcohol use: Yes    Alcohol/week: 1.0 standard drink    Types: 1 Glasses of wine per week    Comment: very occasional  . Drug use: Never  . Sexual activity: Not on file  Other Topics Concern  . Not on file  Social History Narrative   Lives in [place] partner [place].  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     Social Determinants of Health   Financial Resource Strain: Medium Risk  . Difficulty of Paying Living Expenses: Somewhat hard  Food Insecurity: Not on file  Transportation Needs: Not on file  Physical Activity: Not on file  Stress: Not on file  Social Connections: Not on file  Intimate Partner  Violence: Not on file    FAMILY HISTORY: Family History  Problem Relation Age of Onset  . Arthritis Mother   . Heart disease Mother   . Supraventricular tachycardia Mother        s/p ablation  . Hypertension Mother   . Diabetes Father   . Heart attack Maternal Uncle   . Throat cancer Maternal Uncle   . Esophageal cancer Maternal Uncle   . Heart attack Maternal Uncle   . Heart attack Maternal Uncle   . Prostate cancer Maternal Uncle   . Thyroid cancer Neg Hx   . Colon cancer Neg Hx   . Rectal cancer Neg Hx   . Stomach cancer Neg Hx   . Colon polyps Neg Hx     ALLERGIES:  has No Known Allergies.  MEDICATIONS:  Current Outpatient Medications  Medication Sig Dispense Refill  . ALPRAZolam (XANAX) 0.5 MG tablet Take half tablet to one tablet by mouth as needed daily for insomnia, anxiety. 30 tablet 0  . amLODipine (NORVASC) 10 MG tablet TAKE 1 TABLET BY MOUTH  DAILY 90 tablet 3  . Continuous Blood Gluc Sensor (FREESTYLE LIBRE 2 SENSOR) MISC Scan glucose at least QID 2 each 11  . CONTOUR NEXT TEST test strip USE 1 STRIP TO TEST BLOOD  SUGAR TWICE DAILY 200 strip 3  . famotidine (PEPCID) 20 MG tablet  Take 1 tablet (20 mg total) by mouth 2 (two) times daily. 120 tablet 5  . hydrochlorothiazide (HYDRODIURIL) 25 MG tablet Take 25 mg by mouth daily.    . insulin glargine (LANTUS SOLOSTAR) 100 UNIT/ML Solostar Pen Inject 56 Units into the skin at bedtime. 15 mL 3  . insulin lispro (HUMALOG KWIKPEN) 100 UNIT/ML KwikPen Inject 10 units into the skin 10 minutes prior to breakfast lunch & dinner. Hold if glucose is less than 150. 15 mL 5  . Insulin Pen Needle (PEN NEEDLES) 32G X 6 MM MISC Used to give insulin injections 4 times daily. 100 each 10  . Lancets (ONETOUCH ULTRASOFT) lancets Used to check blood sugars twice daily. 100 each 12  . lidocaine-prilocaine (EMLA) cream Apply to affected area once 30 g 3  . metFORMIN (GLUCOPHAGE XR) 500 MG 24 hr tablet Take 4 tablets (2,000 mg total) by  mouth every evening. 120 tablet 3  . metoprolol succinate (TOPROL-XL) 100 MG 24 hr tablet TAKE 1 TABLET BY MOUTH  DAILY WITH OR IMMEDIATELY  FOLLOWING A MEAL 90 tablet 3  . Omega-3 Fatty Acids (FISH OIL PO) Take 800 mg by mouth daily.    . ondansetron (ZOFRAN) 8 MG tablet Take 1 tablet (8 mg total) by mouth 2 (two) times daily as needed for refractory nausea / vomiting. Start on day 3 after chemotherapy. 30 tablet 1  . polyethylene glycol powder (GLYCOLAX/MIRALAX) 17 GM/SCOOP powder Take by mouth daily. 1/2 capful QD    . potassium chloride (KLOR-CON) 10 MEQ tablet Take 1 tablet (10 mEq total) by mouth daily. 30 tablet 0  . senna (SENOKOT) 8.6 MG TABS tablet Take 2 tablets (17.2 mg total) by mouth daily. 120 tablet 0  . telmisartan (MICARDIS) 40 MG tablet Take 1 tablet (40 mg total) by mouth daily. PLEASE CALL OFFICE TO SCHEDULE FOLLOW UP APPOINTMENT FOR REFILLS. 30 tablet 1  . zinc gluconate 50 MG tablet Take 50 mg by mouth daily.    Marland Kitchen gabapentin (NEURONTIN) 100 MG capsule Take 1 capsule (100 mg total) by mouth 3 (three) times daily. (Patient not taking: No sig reported) 90 capsule 0  . lactulose (CHRONULAC) 10 GM/15ML solution Take 15 mLs (10 g total) by mouth 2 (two) times daily as needed for moderate constipation or severe constipation. (Patient not taking: No sig reported) 236 mL 0  . loperamide (IMODIUM A-D) 2 MG tablet Take 1 tablet (2 mg total) by mouth See admin instructions. Take 2 tabs at diarrhea onset , then 1 tab after every loose bowel movement. Maximum 8 tablets within 24 hour period of time 100 tablet 1  . [name] Take 5 mg by mouth at bedtime as needed.  (Patient not taking: No sig reported)    . ondansetron (ZOFRAN ODT) 4 MG disintegrating tablet Take 1 tablet (4 mg total) by mouth every 8 (eight) hours as needed for nausea or vomiting. (Patient not taking: No sig reported) 30 tablet 2  . prochlorperazine (COMPAZINE) 10 MG tablet Take 1 tablet (10 mg total) by mouth every 6  (six) hours as needed (Nausea or vomiting). (Patient not taking: No sig reported) 30 tablet 1  . tamsulosin (FLOMAX) 0.4 MG CAPS capsule Take 1 capsule (0.4 mg total) by mouth daily. (Patient not taking: No sig reported) 30 capsule 1   Current Facility-Administered Medications  Medication Dose Route Frequency Provider Last Rate Last Admin  . 0.9 %  sodium chloride infusion  500 mL Intravenous Once Nelida Meuse III,  MD       Facility-Administered Medications Ordered in Other Visits  Medication Dose Route Frequency Provider Last Rate Last Admin  . fluorouracil (ADRUCIL) 5,500 mg in sodium chloride 0.9 % 140 mL chemo infusion  2,400 mg/m2 (Treatment Plan Recorded) Intravenous 1 day or 1 dose Earlie Server, MD      . fluorouracil (ADRUCIL) chemo injection 900 mg  400 mg/m2 (Treatment Plan Recorded) Intravenous Once Earlie Server, MD      . sodium chloride flush (NS) 0.9 % injection 10 mL  10 mL Intravenous PRN Earlie Server, MD   10 mL at 10/02/20 0825  . sodium chloride flush (NS) 0.9 % injection 10 mL  10 mL Intracatheter PRN Earlie Server, MD         PHYSICAL EXAMINATION: ECOG PERFORMANCE STATUS: 1 - Symptomatic but completely ambulatory Vitals:   10/02/20 0848  BP: (!) 144/93  Pulse: 83  Resp: 18  Temp: (!) 97.1 F (36.2 C)   Filed Weights   10/02/20 0848  Weight: 238 lb 3.2 oz (108 kg)    Physical Exam Constitutional:      General: He is not in acute distress. HENT:     Head: Normocephalic and atraumatic.  Eyes:     General: No scleral icterus. Cardiovascular:     Rate and Rhythm: Normal rate and regular rhythm.     Heart sounds: Normal heart sounds.  Pulmonary:     Effort: Pulmonary effort is normal. No respiratory distress.     Breath sounds: No wheezing.  Abdominal:     General: Bowel sounds are normal. There is no distension.     Palpations: Abdomen is soft.  Musculoskeletal:        General: No deformity. Normal range of motion.     Cervical back: Normal range of motion and neck  supple.  Skin:    General: Skin is warm and dry.     Findings: No erythema or rash.  Neurological:     Mental Status: He is alert and oriented to person, place, and time. Mental status is at baseline.     Cranial Nerves: No cranial nerve deficit.     Coordination: Coordination normal.  Psychiatric:        Mood and Affect: Mood normal.     LABORATORY DATA:  I have reviewed the data as listed Lab Results  Component Value Date   WBC 5.4 10/02/2020   HGB 13.7 10/02/2020   HCT 40.4 10/02/2020   MCV 85.2 10/02/2020   PLT 177 10/02/2020   Recent Labs    04/10/20 1021 04/25/20 0942 05/07/20 0933 06/03/20 0815 06/21/20 0908 06/28/20 1019 09/11/20 0820 09/18/20 0800 10/02/20 0825  NA  --   --  136   < > 135   < > 129* 134* 133*  K  --   --  4.4   < > 4.8   < > 4.1 3.9 3.5  CL  --   --  98   < > 95*   < > 94* 97* 97*  CO2  --   --  21   < > 22   < > 21* 26 27  GLUCOSE  --   --  175*   < > 202*   < > 404* 158* 167*  BUN 18  --  12   < > 11   < > _0 CREATININE 0.91  --  0.86   < > 0.80   < >  1.03 0.75 0.68  CALCIUM  --   --  9.2   < > 9.2   < > 9.2 9.0 8.8*  GFRNONAA 98  --  101   < > 104   < > >60 >60 >60  GFRAA 113  --  117  --  120  --   --   --   --   PROT  --  6.5  --    < >  --    < > 7.6 7.5 7.2  ALBUMIN  --  3.1*  --    < >  --    < > 3.8 3.2* 3.6  AST  --  18  --    < >  --    < > _0 ALT  --  17  --    < >  --    < > _1 ALKPHOS  --  215*  --    < >  --    < > 100 84 99  BILITOT  --  0.7  --    < >  --    < > 1.5* 0.9 1.0  BILIDIR  --  0.47*  --   --   --   --   --   --   --    < > = values in this interval not displayed.   Iron/TIBC/Ferritin/ %Sat No results found for: IRON, TIBC, FERRITIN, IRONPCTSAT    RADIOGRAPHIC STUDIES: I have personally reviewed the radiological images as listed and agreed with the findings in the report. CT CHEST ABDOMEN PELVIS W CONTRAST  Result Date: 09/24/2020 CLINICAL DATA:  Follow-up of metastatic rectal cancer  and pancreatic neuroendocrine carcinoma. EXAM: CT CHEST, ABDOMEN, AND PELVIS WITH CONTRAST TECHNIQUE: Multidetector CT imaging of the chest, abdomen and pelvis was performed following the standard protocol during bolus administration of intravenous contrast. CONTRAST:  130m OMNIPAQUE IOHEXOL 300 MG/ML  SOLN COMPARISON:  08/21/2020 Dotatate PET. 07/18/2020 chest abdomen and pelvic CT. FINDINGS: CT CHEST FINDINGS Cardiovascular: Right Port-A-Cath tip superior caval/atrial junction. Aortic atherosclerosis. Normal heart size, without pericardial effusion. Multivessel coronary artery atherosclerosis. No central pulmonary embolism, on this non-dedicated study. Mediastinum/Nodes: No supraclavicular adenopathy. No mediastinal or hilar adenopathy. Prevascular 5 mm node is unchanged, favored to be reactive. Lungs/Pleura: No pleural fluid. 5 mm vague, possibly sub solid right apical pulmonary nodule is similar on 28/4. Posterior lingular subpleural 2 mm nodule may represent a calcified granuloma and is unchanged. Musculoskeletal: Mild bilateral gynecomastia. No acute osseous abnormality. CT ABDOMEN PELVIS FINDINGS Hepatobiliary: Multifocal, bilateral hepatic metastasis. Index lesion straddling segments 2 and 4A measures 4.2 x 3.8 cm on 52/2. Compare 4.8 x 4.5 cm on the prior exam. High right hepatic lobe subcapsular mass measures 9.5 x 8.3 cm on 50/2 versus 9.0 x 7.7 cm on the prior exam (when remeasured). Anterior segment 4A mass measures 6.1 x 5.2 cm on 56/2 versus 6.3 x 4.9 cm on the prior exam (when remeasured). Index pericholecystic right liver lobe mass measures 5.6 x 4.8 cm on 68/2. Compare 6.9 x 6.9 cm on the prior exam. Normal gallbladder, without biliary ductal dilatation. Pancreas: Normal, without mass or ductal dilatation. Spleen: Subcapsular hypoattenuating splenic lesion of 1.7 cm is nonspecific and decreased from 2.0 cm on the prior exam. Possibly related to remote infarct. Adrenals/Urinary Tract: Normal  adrenal glands. Bilateral too small to characterize renal lesions. No hydronephrosis. Normal urinary bladder. Stomach/Bowel: Normal stomach, without wall thickening.  The upper rectal primary is slightly less distinct today including on 108/2. No obstruction. Normal terminal ileum and appendix. Normal small bowel. Vascular/Lymphatic: Aortic atherosclerosis. 9 mm portal caval node is similar to on the prior exam and not pathologic by size criteria. No pelvic sidewall adenopathy. Tiny perirectal nodes are not significantly changed. Reproductive: Normal prostate. Other: No significant free fluid. No evidence of omental or peritoneal disease. Musculoskeletal: No acute osseous abnormality. IMPRESSION: 1.  No acute process or evidence of metastatic disease in the chest. 2. Since 07/18/2020, hepatic metastasis are felt to be relatively similar. 3. No extrahepatic metastatic disease within the abdomen or pelvis. 4. High rectal primary, slightly less distinct today. 5. Aortic atherosclerosis (ICD10-I70.0), coronary artery atherosclerosis and emphysema (ICD10-J43.9). 6. Similar right apical pulmonary nodule, indeterminate but favored to be benign. Electronically Signed   By: Abigail Miyamoto M.D.   On: 09/24/2020 15:02      ASSESSMENT & PLAN:  1. Rectal cancer metastasized to liver ([place])   2. Neuroendocrine carcinoma of pancreas ([place])   3. Encounter for antineoplastic chemotherapy   4. Neuropathy due to chemotherapeutic drug ([place])   5. Tachycardia   Cancer Staging Neuroendocrine carcinoma of pancreas ([place]) Staging form: Exocrine Pancreas, AJCC 8th Edition - Clinical: Stage IB (cT2, cN0, cM0) - Signed by Earlie Server, MD on 09/11/2020  Rectal cancer metastasized to liver [facility]) Staging form: Colon and Rectum, AJCC 8th Edition - Clinical stage from 06/03/2020: Stage Unknown (cTX, cNX, pM1) - Signed by Earlie Server, MD on 06/03/2020   #Stage IV rectal cancer Was on first-line palliative chemotherapy with FOLFOX and  bevacizumab.   Patient had Infusion reaction to oxaliplatin during cycle 8 FOLFOX bevacizumab. Interval CT chest abdomen pelvis with contrast images were independently viewed by me and discussed with patient. No acute process or evidence of metastatic disease in the chest. Comparing to January CT, hepatic metastatic lesions are felt to be relatively similar.  No extra hepatic metastasis within the abdomen or pelvis.High rectal primary, slightly less distinct today.  Aortic atherosclerosis, right apical pulmonary nodule, indeterminate. CEA has decreased to 947. Since he has developed infusion reaction to oxaliplatin, still has significant amount of tumor burden, Switch patient to FOLFIRI/bevacizumab.  Discussed about the potential side effects of this regimen. Imodium prescription was sent to pharmacy.  Discussed with him about the instructions.  #Pancreatic well differentiated neuroendocrine carcinoma Dotatate PET scan was reviewed and discussed with patient.  Lesion has grown in size now 3cm.  high CEA may also be partially from neuroendocrine carcinoma.  Since we are starting a new chemotherapy regimen today, I will hold off starting lanreotide.  May consider in the future.  # Chemotherapy induced neuropathy. Grade 1  gabapentin 100 mg 3 times a day.  # Hypokalemia, K 3.5 , continue potassium chloride 10 mEq daily.   # Uncontrolled diabetes,  continue follow-up with primary care provider for glycemic control.   #Left upper quadrant pain.  CT showed subcapsular hypoattenuating splenic lesion 1.7 cm which is nonspecific and has decreased.  Favor remote infarct. I will start patient on aspirin 81 mg given his underlying medical problems including diabetes, hypertension, history of splenic infarct and metastatic disease.  He agrees with the plan.  He will buy over-the-counter supplies.  All questions were answered. The patient knows to call the clinic with any problems questions or  concerns.  cc Burnard Hawthorne, FNP    Return of visit: 2 weeks  Earlie Server, MD, PhD Hematology Oncology [facility]  [place] at [place]- [phone] 10/02/2020

## 2020-10-02 NOTE — Progress Notes (Signed)
Patient had diarrhea during the weekend that has now improved.  Last week his gums were tender/painful.

## 2020-10-03 LAB — CEA: CEA: 881 ng/mL — ABNORMAL HIGH (ref 0.0–4.7)

## 2020-10-04 ENCOUNTER — Encounter: Payer: Self-pay | Admitting: Oncology

## 2020-10-04 ENCOUNTER — Other Ambulatory Visit: Payer: Self-pay

## 2020-10-04 ENCOUNTER — Inpatient Hospital Stay: Payer: Managed Care, Other (non HMO)

## 2020-10-04 DIAGNOSIS — C787 Secondary malignant neoplasm of liver and intrahepatic bile duct: Secondary | ICD-10-CM

## 2020-10-04 DIAGNOSIS — C20 Malignant neoplasm of rectum: Secondary | ICD-10-CM

## 2020-10-04 DIAGNOSIS — Z5112 Encounter for antineoplastic immunotherapy: Secondary | ICD-10-CM | POA: Diagnosis not present

## 2020-10-04 MED ORDER — SODIUM CHLORIDE 0.9% FLUSH
10.0000 mL | INTRAVENOUS | Status: DC | PRN
  Administered 2020-10-04: 10 mL
  Filled 2020-10-04: qty 10

## 2020-10-04 MED ORDER — HEPARIN SOD (PORK) LOCK FLUSH 100 UNIT/ML IV SOLN
INTRAVENOUS | Status: AC
  Filled 2020-10-04: qty 5

## 2020-10-04 MED ORDER — HEPARIN SOD (PORK) LOCK FLUSH 100 UNIT/ML IV SOLN
500.0000 [IU] | Freq: Once | INTRAVENOUS | Status: AC | PRN
  Administered 2020-10-04: 500 [IU]
  Filled 2020-10-04: qty 5

## 2020-10-09 ENCOUNTER — Telehealth: Payer: Self-pay | Admitting: *Deleted

## 2020-10-09 NOTE — Telephone Encounter (Signed)
FYI...  Pt called to cx his sched 10/10/20 lab/MD/IVF appt. He stated that he felt fine and did not think he needed to be seen  Nor does he need IVF. Appts cx per pt request.

## 2020-10-10 ENCOUNTER — Inpatient Hospital Stay: Payer: Managed Care, Other (non HMO)

## 2020-10-10 ENCOUNTER — Inpatient Hospital Stay: Payer: Managed Care, Other (non HMO) | Admitting: Oncology

## 2020-10-11 NOTE — Telephone Encounter (Signed)
Omniseq report scanned in Media.

## 2020-10-16 ENCOUNTER — Inpatient Hospital Stay: Payer: Managed Care, Other (non HMO) | Attending: Oncology

## 2020-10-16 ENCOUNTER — Encounter: Payer: Self-pay | Admitting: Oncology

## 2020-10-16 ENCOUNTER — Inpatient Hospital Stay (HOSPITAL_BASED_OUTPATIENT_CLINIC_OR_DEPARTMENT_OTHER): Payer: Managed Care, Other (non HMO) | Admitting: Oncology

## 2020-10-16 ENCOUNTER — Inpatient Hospital Stay: Payer: Managed Care, Other (non HMO)

## 2020-10-16 VITALS — BP 144/89 | HR 82 | Resp 16

## 2020-10-16 VITALS — BP 141/88 | HR 89 | Temp 97.8°F | Resp 18 | Wt 240.0 lb

## 2020-10-16 DIAGNOSIS — Z7982 Long term (current) use of aspirin: Secondary | ICD-10-CM | POA: Diagnosis not present

## 2020-10-16 DIAGNOSIS — C7A8 Other malignant neuroendocrine tumors: Secondary | ICD-10-CM | POA: Diagnosis not present

## 2020-10-16 DIAGNOSIS — I10 Essential (primary) hypertension: Secondary | ICD-10-CM | POA: Diagnosis not present

## 2020-10-16 DIAGNOSIS — G620 Drug-induced polyneuropathy: Secondary | ICD-10-CM

## 2020-10-16 DIAGNOSIS — Z79899 Other long term (current) drug therapy: Secondary | ICD-10-CM | POA: Diagnosis not present

## 2020-10-16 DIAGNOSIS — Z794 Long term (current) use of insulin: Secondary | ICD-10-CM | POA: Diagnosis not present

## 2020-10-16 DIAGNOSIS — E1365 Other specified diabetes mellitus with hyperglycemia: Secondary | ICD-10-CM

## 2020-10-16 DIAGNOSIS — Z5112 Encounter for antineoplastic immunotherapy: Secondary | ICD-10-CM | POA: Insufficient documentation

## 2020-10-16 DIAGNOSIS — E119 Type 2 diabetes mellitus without complications: Secondary | ICD-10-CM | POA: Diagnosis not present

## 2020-10-16 DIAGNOSIS — C7A098 Malignant carcinoid tumors of other sites: Secondary | ICD-10-CM | POA: Insufficient documentation

## 2020-10-16 DIAGNOSIS — Z5111 Encounter for antineoplastic chemotherapy: Secondary | ICD-10-CM

## 2020-10-16 DIAGNOSIS — C7951 Secondary malignant neoplasm of bone: Secondary | ICD-10-CM

## 2020-10-16 DIAGNOSIS — C20 Malignant neoplasm of rectum: Secondary | ICD-10-CM | POA: Diagnosis present

## 2020-10-16 DIAGNOSIS — C787 Secondary malignant neoplasm of liver and intrahepatic bile duct: Secondary | ICD-10-CM

## 2020-10-16 DIAGNOSIS — Z7189 Other specified counseling: Secondary | ICD-10-CM

## 2020-10-16 DIAGNOSIS — T451X5A Adverse effect of antineoplastic and immunosuppressive drugs, initial encounter: Secondary | ICD-10-CM

## 2020-10-16 LAB — CBC WITH DIFFERENTIAL/PLATELET
Abs Immature Granulocytes: 0.03 10*3/uL (ref 0.00–0.07)
Basophils Absolute: 0.1 10*3/uL (ref 0.0–0.1)
Basophils Relative: 1 %
Eosinophils Absolute: 0.2 10*3/uL (ref 0.0–0.5)
Eosinophils Relative: 3 %
HCT: 39.8 % (ref 39.0–52.0)
Hemoglobin: 13.6 g/dL (ref 13.0–17.0)
Immature Granulocytes: 1 %
Lymphocytes Relative: 28 %
Lymphs Abs: 1.9 10*3/uL (ref 0.7–4.0)
MCH: 29.2 pg (ref 26.0–34.0)
MCHC: 34.2 g/dL (ref 30.0–36.0)
MCV: 85.4 fL (ref 80.0–100.0)
Monocytes Absolute: 0.6 10*3/uL (ref 0.1–1.0)
Monocytes Relative: 9 %
Neutro Abs: 3.9 10*3/uL (ref 1.7–7.7)
Neutrophils Relative %: 58 %
Platelets: 220 10*3/uL (ref 150–400)
RBC: 4.66 MIL/uL (ref 4.22–5.81)
RDW: 14.1 % (ref 11.5–15.5)
WBC: 6.6 10*3/uL (ref 4.0–10.5)
nRBC: 0 % (ref 0.0–0.2)

## 2020-10-16 LAB — COMPREHENSIVE METABOLIC PANEL
ALT: 14 U/L (ref 0–44)
AST: 25 U/L (ref 15–41)
Albumin: 3.7 g/dL (ref 3.5–5.0)
Alkaline Phosphatase: 97 U/L (ref 38–126)
Anion gap: 10 (ref 5–15)
BUN: 14 mg/dL (ref 6–20)
CO2: 26 mmol/L (ref 22–32)
Calcium: 9.2 mg/dL (ref 8.9–10.3)
Chloride: 100 mmol/L (ref 98–111)
Creatinine, Ser: 0.76 mg/dL (ref 0.61–1.24)
GFR, Estimated: >60 mL/min (ref >60)
Glucose, Bld: 186 mg/dL — ABNORMAL HIGH (ref 70–99)
Potassium: 3.9 mmol/L (ref 3.5–5.1)
Sodium: 136 mmol/L (ref 135–145)
Total Bilirubin: 0.9 mg/dL (ref 0.3–1.2)
Total Protein: 7.4 g/dL (ref 6.5–8.1)

## 2020-10-16 LAB — PROTEIN, URINE, RANDOM: Total Protein, Urine: 10 mg/dL

## 2020-10-16 MED ORDER — SODIUM CHLORIDE 0.9 % IV SOLN
900.0000 mg | Freq: Once | INTRAVENOUS | Status: AC
  Administered 2020-10-16: 900 mg via INTRAVENOUS
  Filled 2020-10-16: qty 45

## 2020-10-16 MED ORDER — HEPARIN SOD (PORK) LOCK FLUSH 100 UNIT/ML IV SOLN
500.0000 [IU] | Freq: Once | INTRAVENOUS | Status: DC
  Filled 2020-10-16: qty 5

## 2020-10-16 MED ORDER — SODIUM CHLORIDE 0.9 % IV SOLN
150.0000 mg/m2 | Freq: Once | INTRAVENOUS | Status: AC
  Administered 2020-10-16: 340 mg via INTRAVENOUS
  Filled 2020-10-16: qty 15

## 2020-10-16 MED ORDER — PALONOSETRON HCL INJECTION 0.25 MG/5ML
0.2500 mg | Freq: Once | INTRAVENOUS | Status: AC
  Administered 2020-10-16: 0.25 mg via INTRAVENOUS
  Filled 2020-10-16: qty 5

## 2020-10-16 MED ORDER — SODIUM CHLORIDE 0.9 % IV SOLN
2400.0000 mg/m2 | INTRAVENOUS | Status: DC
  Administered 2020-10-16: 5500 mg via INTRAVENOUS
  Filled 2020-10-16: qty 110

## 2020-10-16 MED ORDER — SODIUM CHLORIDE 0.9 % IV SOLN
5.0000 mg/kg | Freq: Once | INTRAVENOUS | Status: AC
  Administered 2020-10-16: 500 mg via INTRAVENOUS
  Filled 2020-10-16: qty 4

## 2020-10-16 MED ORDER — MAGIC MOUTHWASH W/LIDOCAINE: 5.0000 mL | Freq: Four times a day (QID) | ORAL | 3 refills | Status: DC | PRN

## 2020-10-16 MED ORDER — SODIUM CHLORIDE 0.9 % IV SOLN
Freq: Once | INTRAVENOUS | Status: AC
Start: 2020-10-16 — End: 2020-10-16
  Filled 2020-10-16: qty 250

## 2020-10-16 MED ORDER — ATROPINE SULFATE 1 MG/ML IJ SOLN
0.5000 mg | Freq: Once | INTRAMUSCULAR | Status: AC
  Administered 2020-10-16: 0.5 mg via INTRAVENOUS
  Filled 2020-10-16: qty 1

## 2020-10-16 MED ORDER — DEXAMETHASONE SODIUM PHOSPHATE 100 MG/10ML IJ SOLN
10.0000 mg | Freq: Once | INTRAMUSCULAR | Status: AC
Start: 2020-10-16 — End: 2020-10-16
  Administered 2020-10-16: 10 mg via INTRAVENOUS
  Filled 2020-10-16: qty 10

## 2020-10-16 MED ORDER — SODIUM CHLORIDE 0.9% FLUSH
10.0000 mL | Freq: Once | INTRAVENOUS | Status: AC
Start: 2020-10-16 — End: 2020-10-16
  Administered 2020-10-16: 10 mL via INTRAVENOUS
  Filled 2020-10-16: qty 10

## 2020-10-16 MED ORDER — FLUOROURACIL CHEMO INJECTION 2.5 GM/50ML
400.0000 mg/m2 | Freq: Once | INTRAVENOUS | Status: AC
  Administered 2020-10-16: 900 mg via INTRAVENOUS
  Filled 2020-10-16: qty 18

## 2020-10-16 NOTE — Progress Notes (Signed)
Patient states his neuropathy seems to have a different feeling.  Still has episodes of abdominal pain with last episode this morning.

## 2020-10-16 NOTE — Progress Notes (Addendum)
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  #reports symptoms of upper abdomen band like discomfort and bloating for a few months, improved symptoms with omeprazole,.Chronic constipation, he has blood in the stool occationally Seen by PCP in September 2021. AlK phosphatase was recently noted to be elevated as well well elevated GGT. He was referred to see gastroenteralgias on 04/10/2020.  04/25/2020 EGD showed gastric fundus mild inflammation. Biopsy showed reactive gastropathy.  04/30/2020 [place] RUQ showed cirrhosis with multiple hepatic masses.  05/01/2020 AFP 1.7 05/02/2020 MR liver w wo contrast showed numerous heterozygous enhancing liver lesions, throughout the liver, not typical for [facility], likely metastatic disease.  Solid appearing enhancing lesion in pancreatic tail 1.9x1.5cm, suspicious for primary neoplasm.  Mild adenopathy within porta hepatic region and porta hepatic region. Splenomegaly.   Patient has history of SVT, psoriatic arthritis, morbid obesity, GERD, DM.  Family history + maternal uncle with esophageal cancer.   + unintentional weight loss, no fever, chills. "sweats a lot". Currently on antibiotics for proctitis.  He works at [employer], lives in [place]. He lives with his partner.     # 05/17/2020, colonoscopy showed a fungating and infiltrative partially obstructing large mass in the proximal rectum and in the mid rectum.  The mass was partially circumferential, involving two thirds of the lumen circumference is.  Biopsy showed adenocarcinoma.  05/23/2020, patient underwent EUS biopsy of the pancreatic tail lesion.  Biopsy pathology showed well-differentiated neuroendocrine tumor.  Ki-67 is noncontributory due to insufficient tissue in the cell block.  Genetic testing is negative.   INTERVAL HISTORY Dominic [name] is a 50 y.o. 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 Problems and complaints are listed below: Patient tolerated FOLFIRI with bevacizumab.  He experienced mild muscle twitching after the treatment.  Symptoms resolved. Patient had mild diarrhea for short period of time after the chemotherapy, resolved without using antidiarrhea medication. Appetite is fair. Weight is stable Neuropathy symptoms are slightly worse   Review of Systems  Constitutional: Positive for fatigue. Negative for appetite change, chills, fever and unexpected weight change.  HENT:   Negative for hearing loss and voice change.   Eyes: Negative for eye problems and icterus.  Respiratory: Negative for chest tightness, cough and shortness of breath.   Cardiovascular: Negative for chest pain, leg swelling and palpitations.  Gastrointestinal: Negative for abdominal distention and nausea.  Endocrine: Negative for hot flashes.  Genitourinary: Negative for difficulty urinating, dysuria and frequency.   Musculoskeletal: Negative for arthralgias.  Skin: Negative for itching and rash.  Neurological: Positive for numbness. Negative for light-headedness.  Hematological: Negative for adenopathy. Does not bruise/bleed easily.  Psychiatric/Behavioral: Negative for confusion.     MEDICAL HISTORY:  Past Medical History:  Diagnosis Date  . Anxiety   . Cancer ([place])  Rectal cancer metastasized to liver ([place])  . Diabetes mellitus without complication ([place])   . Dysrhythmia    svt  . Family history of esophageal cancer   . Family history of prostate cancer   . GERD (gastroesophageal reflux disease)   . High triglycerides   . Hypertension   . Long-term insulin use in type 2 diabetes ([place])   . Morbid obesity ([place])   . Psoriatic arthritis ([place])   . SVT (supraventricular tachycardia) ([place])     SURGICAL HISTORY: Past Surgical History:  Procedure Laterality Date  . EUS N/A 05/23/2020   Procedure: FULL UPPER ENDOSCOPIC ULTRASOUND (EUS) RADIAL;  Surgeon: Jola Schmidt, MD;  Location: [facility] ENDOSCOPY;  Service: Endoscopy;  Laterality: N/A;  . PORTA CATH INSERTION N/A 05/30/2020   Procedure: PORTA CATH INSERTION;  Surgeon: Algernon Huxley, MD;  Location: [place] [facility];  Service: Cardiovascular;  Laterality: N/A;  . UPPER GASTROINTESTINAL ENDOSCOPY      SOCIAL HISTORY: Social History   Socioeconomic History  . Marital status: Soil scientist    Spouse name: Marc Morgans  . Number of children: Not on file  . Years of education: Not on file  . Highest education level: Not on file  Occupational History  . Not on file  Tobacco Use  . Smoking status: Never Smoker  . Smokeless tobacco: Never Used  Vaping Use  . Vaping Use: Never used  Substance and Sexual Activity  . Alcohol use: Yes    Alcohol/week: 1.0 standard drink    Types: 1 Glasses of wine per week    Comment: very occasional  . Drug use: Never  . Sexual activity: Not on file  Other Topics Concern  . Not on file  Social History Narrative   Lives in [place] partner [place].  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     Social Determinants of Health   Financial Resource Strain: Medium Risk  . Difficulty of Paying Living Expenses: Somewhat hard  Food Insecurity: Not on file  Transportation Needs: Not on file   Physical Activity: Not on file  Stress: Not on file  Social Connections: Not on file  Intimate Partner Violence: Not on file    FAMILY HISTORY: Family History  Problem Relation Age of Onset  . Arthritis Mother   . Heart disease Mother   . Supraventricular tachycardia Mother        s/p ablation  . Hypertension Mother   . Diabetes Father   . Heart attack Maternal Uncle   . Throat cancer Maternal Uncle   . Esophageal cancer Maternal Uncle   . Heart attack Maternal Uncle   . Heart attack Maternal Uncle   . Prostate cancer Maternal Uncle   . Thyroid cancer Neg Hx   . Colon cancer Neg Hx   . Rectal cancer Neg Hx   . Stomach cancer Neg Hx   . Colon polyps Neg Hx     ALLERGIES:  has No Known Allergies.  MEDICATIONS:  Current Outpatient Medications  Medication Sig Dispense Refill  . ALPRAZolam (XANAX) 0.5 MG tablet Take half tablet to one tablet by mouth as needed daily for insomnia, anxiety. 30 tablet 0  . amLODipine (NORVASC) 10 MG tablet TAKE 1 TABLET BY MOUTH  DAILY 90 tablet 3  . aspirin EC 81 MG tablet Take 1 tablet (81 mg total) by mouth daily. Swallow whole. 30 tablet 11  . Continuous Blood Gluc Sensor (FREESTYLE LIBRE 2 SENSOR) MISC Scan glucose at least QID 2 each 11  .  CONTOUR NEXT TEST test strip USE 1 STRIP TO TEST BLOOD  SUGAR TWICE DAILY 200 strip 3  . famotidine (PEPCID) 20 MG tablet Take 1 tablet (20 mg total) by mouth 2 (two) times daily. 120 tablet 5  . gabapentin (NEURONTIN) 100 MG capsule Take 1 capsule (100 mg total) by mouth 3 (three) times daily. (Patient not taking: No sig reported) 90 capsule 0  . hydrochlorothiazide (HYDRODIURIL) 25 MG tablet Take 25 mg by mouth daily.    . insulin glargine (LANTUS SOLOSTAR) 100 UNIT/ML Solostar Pen Inject 56 Units into the skin at bedtime. 15 mL 3  . insulin lispro (HUMALOG KWIKPEN) 100 UNIT/ML KwikPen Inject 10 units into the skin 10 minutes prior to breakfast lunch & dinner. Hold if glucose is less than 150. 15 mL 5   . Insulin Pen Needle (PEN NEEDLES) 32G X 6 MM MISC Used to give insulin injections 4 times daily. 100 each 10  . lactulose (CHRONULAC) 10 GM/15ML solution Take 15 mLs (10 g total) by mouth 2 (two) times daily as needed for moderate constipation or severe constipation. (Patient not taking: No sig reported) 236 mL 0  . Lancets (ONETOUCH ULTRASOFT) lancets Used to check blood sugars twice daily. 100 each 12  . lidocaine-prilocaine (EMLA) cream Apply to affected area once 30 g 3  . loperamide (IMODIUM A-D) 2 MG tablet Take 1 tablet (2 mg total) by mouth See admin instructions. Take 2 tabs at diarrhea onset , then 1 tab after every loose bowel movement. Maximum 8 tablets within 24 hour period of time 100 tablet 1  . [name] Take 5 mg by mouth at bedtime as needed.  (Patient not taking: No sig reported)    . metFORMIN (GLUCOPHAGE XR) 500 MG 24 hr tablet Take 4 tablets (2,000 mg total) by mouth every evening. 120 tablet 3  . metoprolol succinate (TOPROL-XL) 100 MG 24 hr tablet TAKE 1 TABLET BY MOUTH  DAILY WITH OR IMMEDIATELY  FOLLOWING A MEAL 90 tablet 3  . Omega-3 Fatty Acids (FISH OIL PO) Take 800 mg by mouth daily.    . ondansetron (ZOFRAN ODT) 4 MG disintegrating tablet Take 1 tablet (4 mg total) by mouth every 8 (eight) hours as needed for nausea or vomiting. (Patient not taking: No sig reported) 30 tablet 2  . ondansetron (ZOFRAN) 8 MG tablet Take 1 tablet (8 mg total) by mouth 2 (two) times daily as needed for refractory nausea / vomiting. Start on day 3 after chemotherapy. 30 tablet 1  . polyethylene glycol powder (GLYCOLAX/MIRALAX) 17 GM/SCOOP powder Take by mouth daily. 1/2 capful QD    . potassium chloride (KLOR-CON) 10 MEQ tablet Take 1 tablet (10 mEq total) by mouth daily. 30 tablet 0  . prochlorperazine (COMPAZINE) 10 MG tablet Take 1 tablet (10 mg total) by mouth every 6 (six) hours as needed (Nausea or vomiting). (Patient not taking: No sig reported) 30 tablet 1  . senna (SENOKOT) 8.6  MG TABS tablet Take 2 tablets (17.2 mg total) by mouth daily. 120 tablet 0  . tamsulosin (FLOMAX) 0.4 MG CAPS capsule Take 1 capsule (0.4 mg total) by mouth daily. (Patient not taking: No sig reported) 30 capsule 1  . telmisartan (MICARDIS) 40 MG tablet Take 1 tablet (40 mg total) by mouth daily. PLEASE CALL OFFICE TO SCHEDULE FOLLOW UP APPOINTMENT FOR REFILLS. 30 tablet 1  . zinc gluconate 50 MG tablet Take 50 mg by mouth daily.     Current Facility-Administered Medications  Medication  Dose Route Frequency Provider Last Rate Last Admin  . 0.9 %  sodium chloride infusion  500 mL Intravenous Once Doran Stabler, MD       Facility-Administered Medications Ordered in Other Visits  Medication Dose Route Frequency Provider Last Rate Last Admin  . heparin lock flush 100 unit/mL  500 Units Intravenous Once Earlie Server, MD      . sodium chloride flush (NS) 0.9 % injection 10 mL  10 mL Intravenous Once Earlie Server, MD         PHYSICAL EXAMINATION: ECOG PERFORMANCE STATUS: 1 - Symptomatic but completely ambulatory Vitals:   10/16/20 0913  BP: (!) 141/88  Pulse: 89  Resp: 18  Temp: 97.8 F (36.6 C)   Filed Weights   10/16/20 0913  Weight: 240 lb (108.9 kg)    Physical Exam Constitutional:      General: He is not in acute distress. HENT:     Head: Normocephalic and atraumatic.  Eyes:     General: No scleral icterus. Cardiovascular:     Rate and Rhythm: Normal rate and regular rhythm.     Heart sounds: Normal heart sounds.  Pulmonary:     Effort: Pulmonary effort is normal. No respiratory distress.     Breath sounds: No wheezing.  Abdominal:     General: Bowel sounds are normal. There is no distension.     Palpations: Abdomen is soft.  Musculoskeletal:        General: No deformity. Normal range of motion.     Cervical back: Normal range of motion and neck supple.  Skin:    General: Skin is warm and dry.     Findings: No erythema or rash.  Neurological:     Mental Status: He is  alert and oriented to person, place, and time. Mental status is at baseline.     Cranial Nerves: No cranial nerve deficit.     Coordination: Coordination normal.  Psychiatric:        Mood and Affect: Mood normal.     LABORATORY DATA:  I have reviewed the data as listed Lab Results  Component Value Date   WBC 5.4 10/02/2020   HGB 13.7 10/02/2020   HCT 40.4 10/02/2020   MCV 85.2 10/02/2020   PLT 177 10/02/2020   Recent Labs    04/10/20 1021 04/25/20 0942 05/07/20 0933 06/03/20 0815 06/21/20 0908 06/28/20 1019 09/11/20 0820 09/18/20 0800 10/02/20 0825  NA  --   --  136   < > 135   < > 129* 134* 133*  K  --   --  4.4   < > 4.8   < > 4.1 3.9 3.5  CL  --   --  98   < > 95*   < > 94* 97* 97*  CO2  --   --  21   < > 22   < > 21* 26 27  GLUCOSE  --   --  175*   < > 202*   < > 404* 158* 167*  BUN 18  --  12   < > 11   < > _0 CREATININE 0.91  --  0.86   < > 0.80   < > 1.03 0.75 0.68  CALCIUM  --   --  9.2   < > 9.2   < > 9.2 9.0 8.8*  GFRNONAA 98  --  101   < > 104   < > >60 >60 >  60  GFRAA 113  --  117  --  120  --   --   --   --   PROT  --  6.5  --    < >  --    < > 7.6 7.5 7.2  ALBUMIN  --  3.1*  --    < >  --    < > 3.8 3.2* 3.6  AST  --  18  --    < >  --    < > _0 ALT  --  17  --    < >  --    < > _1 ALKPHOS  --  215*  --    < >  --    < > 100 84 99  BILITOT  --  0.7  --    < >  --    < > 1.5* 0.9 1.0  BILIDIR  --  0.47*  --   --   --   --   --   --   --    < > = values in this interval not displayed.   Iron/TIBC/Ferritin/ %Sat No results found for: IRON, TIBC, FERRITIN, IRONPCTSAT    RADIOGRAPHIC STUDIES: I have personally reviewed the radiological images as listed and agreed with the findings in the report. 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 EXAM: CT CHEST, ABDOMEN, AND PELVIS WITH CONTRAST TECHNIQUE: Multidetector CT imaging  of the chest, abdomen and pelvis was performed following the standard protocol during bolus administration of intravenous contrast. CONTRAST:  159m OMNIPAQUE IOHEXOL 300 MG/ML  SOLN COMPARISON:  08/21/2020 Dotatate PET. 07/18/2020 chest abdomen and pelvic CT. FINDINGS: CT CHEST FINDINGS Cardiovascular: Right Port-A-Cath tip superior caval/atrial junction. Aortic atherosclerosis. Normal heart size, without pericardial effusion. Multivessel coronary artery atherosclerosis. No central pulmonary embolism, on this non-dedicated study. Mediastinum/Nodes: No supraclavicular adenopathy. No mediastinal or hilar adenopathy. Prevascular 5 mm node is unchanged, favored to be reactive. Lungs/Pleura: No pleural fluid. 5 mm vague, possibly sub solid right apical pulmonary nodule is similar on 28/4. Posterior lingular subpleural 2 mm nodule may represent a calcified granuloma and is unchanged. Musculoskeletal: Mild bilateral gynecomastia. No acute osseous abnormality. CT ABDOMEN PELVIS FINDINGS Hepatobiliary: Multifocal, bilateral hepatic metastasis. Index lesion straddling segments 2 and 4A measures 4.2 x 3.8 cm on 52/2. Compare 4.8 x 4.5 cm on the prior exam. High right hepatic lobe subcapsular mass measures 9.5 x 8.3 cm on 50/2 versus 9.0 x 7.7 cm on the prior exam (when remeasured). Anterior segment 4A mass measures 6.1 x 5.2 cm on 56/2 versus 6.3 x 4.9 cm on the prior exam (when remeasured). Index pericholecystic right liver lobe mass measures 5.6 x 4.8 cm on 68/2. Compare 6.9 x 6.9 cm on the prior exam. Normal gallbladder, without biliary ductal dilatation. Pancreas: Normal, without mass or ductal dilatation. Spleen: Subcapsular hypoattenuating splenic lesion of 1.7 cm is nonspecific and decreased from 2.0 cm on the prior exam. Possibly related to remote infarct. Adrenals/Urinary Tract: Normal adrenal glands. Bilateral too small to characterize renal lesions. No hydronephrosis. Normal urinary bladder. Stomach/Bowel: Normal  stomach, without wall thickening. The upper rectal primary is slightly less distinct today including on 108/2. No obstruction. Normal terminal ileum and appendix. Normal small bowel. Vascular/Lymphatic: Aortic atherosclerosis. 9 mm portal caval node is similar to on the prior exam and not pathologic by size  criteria. No pelvic sidewall adenopathy. Tiny perirectal nodes are not significantly changed. Reproductive: Normal prostate. Other: No significant free fluid. No evidence of omental or peritoneal disease. Musculoskeletal: No acute osseous abnormality. IMPRESSION: 1.  No acute process or evidence of metastatic disease in the chest. 2. Since 07/18/2020, hepatic metastasis are felt to be relatively similar. 3. No extrahepatic metastatic disease within the abdomen or pelvis. 4. High rectal primary, slightly less distinct today. 5. Aortic atherosclerosis (ICD10-I70.0), coronary artery atherosclerosis and emphysema (ICD10-J43.9). 6. Similar right apical pulmonary nodule, indeterminate but favored to be benign. Electronically Signed   By: Abigail Miyamoto M.D.   On: 09/24/2020 15:02      ASSESSMENT & PLAN:  1. Rectal cancer metastasized to liver ([place])   2. Neuroendocrine carcinoma of pancreas ([place])   3. Encounter for antineoplastic chemotherapy   4. Neuropathy due to chemotherapeutic drug ([place])   5. Uncontrolled other specified diabetes mellitus with hyperglycemia ([place])   Cancer Staging Neuroendocrine carcinoma of pancreas ([place]) Staging form: Exocrine Pancreas, AJCC 8th Edition - Clinical: Stage IB (cT2, cN0, cM0) - Signed by Earlie Server, MD on 09/11/2020  Rectal cancer metastasized to liver [facility]) Staging form: Colon and Rectum, AJCC 8th Edition - Clinical stage from 06/03/2020: Stage Unknown (cTX, cNX, pM1) - Signed by Earlie Server, MD on 06/03/2020   #Stage IV rectal cancer Was on first-line palliative chemotherapy with FOLFOX and bevacizumab.   Patient had Infusion reaction to oxaliplatin during cycle 8  FOLFOX bevacizumab. Labs are reviewed and discussed with patient. Proceed with FOLFIRI with bevacizumab.  He tolerated well, increased Irinotecan 198m/ mg2 #Pancreatic well differentiated neuroendocrine carcinoma Dotatate PET scan was reviewed and discussed with patient.  Lesion has grown in size now 3cm.  high CEA may also be partially from neuroendocrine carcinoma. He is asymptomatic from neuroendocrine carcinoma at this point.  I will hold off starting lanreotide.  May consider in the future.  # Chemotherapy induced neuropathy. Grade 1  gabapentin 100 mg 3 times a day.  Continue # Hypokalemia, K 3.9 , continue potassium chloride 10 mEq daily.   # Uncontrolled diabetes,  continue follow-up with primary care provider for glycemic control.  Glucose level is at 186.  All questions were answered. The patient knows to call the clinic with any problems questions or concerns.  cc ABurnard Hawthorne FNP    Return of visit: 2 weeks  ZEarlie Server MD, PhD Hematology Oncology CHal[facility] Centerat AThe Surgical Center Of The Treasure CoastPager- [phone]/12/2020

## 2020-10-16 NOTE — Progress Notes (Signed)
As 5FU was being given to the patient, patient stated he forgot to make Dr. Tasia Catchings aware that after his last treatment he had muscle twitching for approx. 3 hours in BUE and BLE. Patient then started experiencing it as I was finishing 5FU push. Dr. Tasia Catchings made aware. Patient preferred to leave as he felt fine with the twitching. Educated patient on s/s as to when to seek emergency care and to contact clinic with any questions or concerns. Patient verbalizes understanding and denies any further questions or concerns.

## 2020-10-17 LAB — CEA: CEA: 890 ng/mL — ABNORMAL HIGH (ref 0.0–4.7)

## 2020-10-18 ENCOUNTER — Inpatient Hospital Stay: Payer: Managed Care, Other (non HMO)

## 2020-10-18 DIAGNOSIS — Z5112 Encounter for antineoplastic immunotherapy: Secondary | ICD-10-CM | POA: Diagnosis not present

## 2020-10-18 DIAGNOSIS — C20 Malignant neoplasm of rectum: Secondary | ICD-10-CM

## 2020-10-18 DIAGNOSIS — C787 Secondary malignant neoplasm of liver and intrahepatic bile duct: Secondary | ICD-10-CM

## 2020-10-18 MED ORDER — HEPARIN SOD (PORK) LOCK FLUSH 100 UNIT/ML IV SOLN
500.0000 [IU] | Freq: Once | INTRAVENOUS | Status: AC | PRN
  Administered 2020-10-18: 500 [IU]
  Filled 2020-10-18: qty 5

## 2020-10-18 MED ORDER — SODIUM CHLORIDE 0.9% FLUSH
10.0000 mL | INTRAVENOUS | Status: DC | PRN
Start: 2020-10-18 — End: 2020-10-18
  Administered 2020-10-18: 10 mL
  Filled 2020-10-18: qty 10

## 2020-10-21 ENCOUNTER — Other Ambulatory Visit: Payer: Self-pay | Admitting: Family

## 2020-10-21 ENCOUNTER — Ambulatory Visit: Payer: Managed Care, Other (non HMO) | Admitting: Pharmacist

## 2020-10-21 DIAGNOSIS — I10 Essential (primary) hypertension: Secondary | ICD-10-CM

## 2020-10-21 DIAGNOSIS — F419 Anxiety disorder, unspecified: Secondary | ICD-10-CM

## 2020-10-21 DIAGNOSIS — G4700 Insomnia, unspecified: Secondary | ICD-10-CM

## 2020-10-21 DIAGNOSIS — E119 Type 2 diabetes mellitus without complications: Secondary | ICD-10-CM

## 2020-10-21 MED ORDER — INSULIN LISPRO (1 UNIT DIAL) 100 UNIT/ML (KWIKPEN): 18.0000 [IU] | PEN_INJECTOR | Freq: Three times a day (TID) | SUBCUTANEOUS | 5 refills | Status: DC

## 2020-10-21 MED ORDER — LANTUS SOLOSTAR 100 UNIT/ML ~~LOC~~ SOPN: 56.0000 [IU] | PEN_INJECTOR | Freq: Every day | SUBCUTANEOUS | 3 refills | Status: DC

## 2020-10-21 MED ORDER — FREESTYLE LIBRE 2 SENSOR MISC: 11 refills | Status: DC

## 2020-10-21 NOTE — Patient Instructions (Signed)
  Visit Information  Goals Addressed              This Visit's Progress   .  Medication Monitoring (pt-stated)        Patient Goals/Self-Care Activities . Over the next 90 days, patient will:  - take medications as prescribed check glucose at least three times daily using CGM, document, and provide at future appointments -Collaborate with interdisciplinary team.        Patient verbalizes understanding of instructions provided today and agrees to view in [place].   Telephone follow up appointment with care management team member scheduled for: ~5 weeks  Lorel Monaco, PharmD, [place]

## 2020-10-21 NOTE — Chronic Care Management (AMB) (Addendum)
Care Management   Pharmacy Note  10/21/2020 Name: Dominic [name] MRN: [id number] DOB: 1971-01-20  Subjective: Dominic [name] is a 50 y.o. year old male who is a primary care patient of Burnard Hawthorne, FNP. The Care Management team was consulted for assistance with care management and care coordination needs.    Engaged with patient by telephone for follow up visit in response to provider referral for pharmacy case management and/or care coordination services.   The patient was given information about Care Management services today including:  1. Care Management services includes personalized support from designated clinical staff supervised by the patient's primary care provider, including individualized plan of care and coordination with other care providers. 2. 24/7 contact phone numbers for assistance for urgent and routine care needs. 3. The patient may stop case management services at any time by phone call to the office staff.  Patient agreed to services and consent obtained.  Assessment:  Review of patient status, including review of consultants reports, laboratory and other test data, was performed as part of comprehensive evaluation and provision of chronic care management services.   SDOH (Social Determinants of Health) assessments and interventions performed:  SDOH Interventions   Flowsheet Row Most Recent Value  SDOH Interventions   Financial Strain Interventions Intervention Not Indicated       Objective:  Lab Results  Component Value Date   CREATININE 0.76 10/16/2020   CREATININE 0.68 10/02/2020   CREATININE 0.75 09/18/2020    Lab Results  Component Value Date   HGBA1C 8.3 (H) 06/21/2020       Component Value Date/Time   CHOL 226 (H) 03/07/2020 0902   TRIG 123 03/07/2020 0902   HDL 33 (L) 03/07/2020 0902   CHOLHDL 6.8 (H) 03/07/2020 0902   LDLCALC 170 (H) 03/07/2020 0902    Clinical ASCVD: No  The 10-year ASCVD risk score [name], et al.,  2013) is: 16.8%   Values used to calculate the score:     Age: 24 years     Sex: Male     Is Non-Hispanic African American: No     Diabetic: Yes     Tobacco smoker: No     Systolic Blood Pressure: 329 mmHg     Is BP treated: Yes     HDL Cholesterol: 33 mg/dL     Total Cholesterol: 226 mg/dL     BP Readings from Last 3 Encounters:  10/16/20 (!) 144/89  10/16/20 (!) 141/88  10/02/20 (!) 144/93    Care Plan  No Known Allergies  Medications Reviewed Today    Reviewed by Avie Arenas, RPH (Pharmacist) on 10/21/20 at [place] List Status: <None>  Medication Order Taking? Sig Documenting Provider Last Dose Status Informant  0.9 %  sodium chloride infusion [id number]   Doran Stabler, MD  Active   ALPRAZolam Duanne Moron) 0.5 MG tablet [id number] Yes Take half tablet to one tablet by mouth as needed daily for insomnia, anxiety. Burnard Hawthorne, FNP Taking Active   amLODipine (NORVASC) 10 MG tablet [id number] Yes TAKE 1 TABLET BY MOUTH  DAILY End, Harrell Gave, MD Taking Active   aspirin EC 81 MG tablet [id number] No Take 1 tablet (81 mg total) by mouth daily. Swallow whole.  Patient not taking: No sig reported   Earlie Server, MD Not Taking Active   Continuous Blood Gluc Sensor (FREESTYLE LIBRE 2 SENSOR) [place] [id number] Yes Scan glucose at least QID Burnard Hawthorne, FNP Taking Active  CONTOUR NEXT TEST test strip [id number]  USE 1 STRIP TO TEST BLOOD  SUGAR TWICE DAILY Burnard Hawthorne, FNP  Active   famotidine (PEPCID) 20 MG tablet [id number] Yes Take 1 tablet (20 mg total) by mouth 2 (two) times daily. Burnard Hawthorne, FNP Taking Active   gabapentin (NEURONTIN) 100 MG capsule [id number] Yes Take 1 capsule (100 mg total) by mouth 3 (three) times daily. Earlie Server, MD Taking Active   hydrochlorothiazide (HYDRODIURIL) 25 MG tablet [id number] Yes Take 25 mg by mouth daily. [provider] Taking Active   insulin lispro (HUMALOG KWIKPEN) 100 UNIT/ML KwikPen [id number] Yes Inject 10 units  into the skin 10 minutes prior to breakfast lunch & dinner. Hold if glucose is less than 150. Burnard Hawthorne, FNP Taking Active            Med Note (Morgann Woodburn A   Mon Sep 23, 2020 11:18 AM) Taking 15 units TID  Insulin Pen Needle (PEN NEEDLES) 32G X 6 MM MISC [id number]  Used to give insulin injections 4 times daily. Burnard Hawthorne, FNP  Active   lactulose (CHRONULAC) 10 GM/15ML solution [id number] No Take 15 mLs (10 g total) by mouth 2 (two) times daily as needed for moderate constipation or severe constipation.  Patient not taking: No sig reported   Earlie Server, MD Not Taking Active   Lancets [facility] ULTRASOFT) lancets [id number]  Used to check blood sugars twice daily. Burnard Hawthorne, FNP  Active   LANTUS SOLOSTAR 100 UNIT/ML Solostar Pen [id number] Yes INJECT SUBCUTANEOUSLY 50  UNITS DAILY Burnard Hawthorne, FNP Taking Active            Med Note (Agapita Savarino A   Mon Oct 21, 2020  2:07 PM) Taking 56 units daily  lidocaine-prilocaine (EMLA) cream [id number] Yes Apply to affected area once Earlie Server, MD Taking Active   loperamide (IMODIUM A-D) 2 MG tablet [id number] No Take 1 tablet (2 mg total) by mouth See admin instructions. Take 2 tabs at diarrhea onset , then 1 tab after every loose bowel movement. Maximum 8 tablets within 24 hour period of time  Patient not taking: Reported on 10/21/2020   Earlie Server, MD Not Taking Active   magic mouthwash w/lidocaine SOLN [id number] No Take 5 mLs by mouth 4 (four) times daily as needed for mouth pain. 80 ml viscous lidocaine 2%, 80 ml Mylanta, 80 ml Diphenhydramine 12.5 mg/5 ml Elixir, 80 ml Nystatin 100,000 Unit suspension, 80 ml Prednisolone 15 mg/21ml, 80 ml Distilled Water.  Sig: Swish/Swallow 5-10 ml four times a day as needed.  Patient not taking: Reported on 10/21/2020   Earlie Server, MD Not Taking Active   [name] [id number] No Take 5 mg by mouth at bedtime as needed.   Patient not taking: No sig reported   [provider] Not Taking  Active   metFORMIN (GLUCOPHAGE XR) 500 MG 24 hr tablet [id number] Yes Take 4 tablets (2,000 mg total) by mouth every evening. Burnard Hawthorne, FNP Taking Active   metoprolol succinate (TOPROL-XL) 100 MG 24 hr tablet [id number] Yes TAKE 1 TABLET BY MOUTH  DAILY WITH OR IMMEDIATELY  FOLLOWING A MEAL Leone Haven, MD Taking Active   Omega-3 Fatty Acids (FISH OIL PO) [id number] Yes Take 800 mg by mouth daily. [provider] Taking Active Self  ondansetron (ZOFRAN ODT) 4 MG disintegrating tablet [id number] No Take 1 tablet (4 mg total) by mouth every 8 (eight) hours as needed for nausea  or vomiting.  Patient not taking: No sig reported   Burnard Hawthorne, FNP Not Taking Active   ondansetron (ZOFRAN) 8 MG tablet [id number] No Take 1 tablet (8 mg total) by mouth 2 (two) times daily as needed for refractory nausea / vomiting. Start on day 3 after chemotherapy.  Patient not taking: Reported on 10/21/2020   Earlie Server, MD Not Taking Active   polyethylene glycol powder (GLYCOLAX/MIRALAX) 17 GM/SCOOP powder [id number]  Take by mouth daily. 1/2 capful QD [provider]  Active   potassium chloride (KLOR-CON) 10 MEQ tablet [id number] Yes Take 1 tablet (10 mEq total) by mouth daily. Earlie Server, MD Taking Active   prochlorperazine (COMPAZINE) 10 MG tablet [id number] No Take 1 tablet (10 mg total) by mouth every 6 (six) hours as needed (Nausea or vomiting).  Patient not taking: No sig reported   Earlie Server, MD Not Taking Active   senna (SENOKOT) 8.6 MG TABS tablet [id number] No Take 2 tablets (17.2 mg total) by mouth daily.  Patient not taking: Reported on 10/21/2020   Earlie Server, MD Not Taking Active   tamsulosin [facility]) 0.4 MG CAPS capsule [id number] No Take 1 capsule (0.4 mg total) by mouth daily.  Patient not taking: No sig reported   Stoioff, Ronda Fairly, MD Not Taking Active   telmisartan (MICARDIS) 40 MG tablet [id number] Yes Take 1 tablet (40 mg total) by mouth daily. PLEASE CALL OFFICE TO  SCHEDULE FOLLOW UP APPOINTMENT FOR REFILLS. Loel Dubonnet, NP Taking Active   zinc gluconate 50 MG tablet [id number] Yes Take 50 mg by mouth daily. [provider] Taking Active           Patient Active Problem List   Diagnosis Date Noted  . Tachycardia 09/11/2020  . Nausea and vomiting 09/11/2020  . Genetic testing 07/23/2020  . Family history of esophageal cancer   . Family history of prostate cancer   . Insomnia 06/14/2020  . Neuropathy due to chemotherapeutic drug ([place]) 06/11/2020  . Uncontrolled diabetes mellitus ([place]) 06/11/2020  . Encounter for antineoplastic chemotherapy 06/03/2020  . 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 Rectal cancer metastasized to liver ([place]) 05/27/2020  . Goals of care, counseling/discussion 05/19/2020  . Dysuria 05/07/2020  . Acute prostatitis 05/07/2020  . Liver disease 05/02/2020  . Bloating 03/13/2020  . GERD (gastroesophageal reflux disease)   . Hypertriglyceridemia   . Hepatic steatosis 08/14/2019  . Unstable angina ([place]) 08/11/2019  . H/O supraventricular tachycardia 08/11/2019  . Morbid obesity ([place]) 08/11/2019  . COVID-19 07/18/2019  . Type 2 diabetes mellitus without complication, with long-term current use of insulin ([place]) 02/27/2019  . SVT (supraventricular tachycardia) ([place]) 02/27/2019  . Uncontrolled hypertension 02/27/2019  . Psoriatic arthritis ([place]) 02/27/2019    Conditions to be addressed/monitored: HTN, HLD and DMII  Care Plan : Medication Management  Updates made by Kyndall Amero A, RPH since 10/21/2020 12:00 AM    Problem: Diabetes, Cancer, HTN w/ SVT     Long-Range Goal: Disease Progression Prevention   This Visit's Progress: On track  Recent Progress: On track  Priority: High  Note:   Current Barriers:  . Unable to independently afford treatment regimen . Unable to achieve control of diabetes  . Complex patient with recent cancer diagnosis  Pharmacist Clinical Goal(s):  Marland Kitchen Over the  next 90 days, patient will verbalize ability to afford treatment regimen. . Over the next 90 days, patient will adhere to prescribed medication regimen  Interventions: . 1:1 collaboration with  Burnard Hawthorne, FNP regarding development and update of comprehensive plan of care as evidenced by provider attestation and co-signature . Inter-disciplinary care team collaboration (see longitudinal plan of care) . Comprehensive medication review performed; medication list updated in electronic medical record  Diabetes: . Uncontrolled; current treatment: metformin XR 500 mg QAM, 1500 mg QPM, Lantus 56 units daily, Humalog 15 units TID w/meals.  o Pt instructed by PCP to hold Humalog if BG <150 . Current glucose readings: CGM Libre o Date of Download: October 01, 2020 - October 14, 2020 o % Time CGM is active: 92% o Average Glucose: 271 mg/dL o Glucose Management Indicator: 9.8% o Glucose Variability: 31.5% (goal <36%) o Time in Goal:  o - Time in range 70-180: 18% o - Time above range: 72% o - Time below range: 0% o Observed patterns: High throughout the day, most likely secondary to recent administration of chemotherapy supportive medications (IV steroids and IV dextrose) given every 14 days. . Denies symptomatic hypoglycemia . Diet - Eating 2-3 meals/day. Occasional nausea. Indulges in sweets occassionally (chocolate covered almonds), carb-sensitive . Pt requested Lantus refill to be sent to [facility] . After discussion with Joycelyn Schmid (PCP), will increase Humalog from 15 units to 18 units TID w/meals. Advised to hold humalog if glucose is less than 100. Continued Lantus 56 units daily.  Hypertension w/ SVT . Appropriately controlled given comorbidities; current treatment: amlodipine 10 mg, HCTZ 25 mg daily, metoprolol succinate 100 mg daily, telmisartan 40 mg daily   . Encouraged to continue current therapy at this time and collaboration with cardiology  Malignancy: . Followed by Dr. Tasia Catchings.  Symptomatic management includes: alprazolam 0.25-0.5 mg PRN anxiety; gabapentin 100 mg TID, lactulose 10 g BID PRN severe constipation, ondansetron ODT PRN nausea, potassium 10 mEq daily, prochlorperazine 10 mg PRN nausea, senna 8.6 mg PRN moderate constipation . Receives IV steroids and IV dextrose with each chemotherapy cycle . Reports possible new chemotherapy regimen in the future for pancreatic cancer management . Continue current collaboration with cardiology at this time  BPH: . Controlled per patient report; current regimen: tamsulosin 0.4 mg daily (not taking); follows w/ Dr. Diamantina Providence . Continue current regimen along with urology collaboration at this time  Patient Goals/Self-Care Activities . Over the next 90 days, patient will:  - take medications as prescribed check glucose at least three times daily using CGM, document, and provide at future appointments Collaborate with interdisciplinary team   Follow Up Plan: Telephone follow up appointment with care management team member scheduled for: ~5 weeks      Medication Assistance:  None required.  Patient affirms current coverage meets needs.  Follow Up:  Patient agrees to Care Plan and Follow-up.  Plan: Telephone follow up appointment with care management team member scheduled for:  ~5 weeks  Lorel Monaco, PharmD, BCPS PGY2 [place]   I was present for this visit and agree with the documentation by the resident as above.   Catie Darnelle Maffucci, PharmD, [place], CPP Clinical Pharmacist [place] at [facility] [phone]  Encounter details: CCM Time Spent      Value Time User   Total time (minutes)  0 10/21/2020  2:24 PM Rekha Hobbins A, RPH     Moderate to High Complex Decision Making    None     CCM Services: This encounter meets routine CCM services.  Prior to outreach and patient consent for Chronic Care Management, I referred this patient for services after reviewing  the nominated patient list or from a personal encounter with the patient.  I have personally reviewed this encounter including the documentation in this note and have collaborated with the care management provider regarding care management and care coordination activities to include development and update of the comprehensive care plan. I am certifying that I agree with the content of this note and encounter as supervising physician.  Agree with increase Humalog from 15 units to 18 units TID w/meals and to hold humalog if glucose is less than 100 and post prandial readings are elevated. Fasting glucose has improved.  Margaret arnett

## 2020-10-22 ENCOUNTER — Other Ambulatory Visit: Payer: Self-pay

## 2020-10-22 DIAGNOSIS — E119 Type 2 diabetes mellitus without complications: Secondary | ICD-10-CM

## 2020-10-22 MED ORDER — ONETOUCH ULTRASOFT LANCETS MISC: 12 refills | Status: DC

## 2020-10-26 ENCOUNTER — Other Ambulatory Visit: Payer: Self-pay | Admitting: Oncology

## 2020-10-30 ENCOUNTER — Other Ambulatory Visit: Payer: Self-pay

## 2020-10-30 ENCOUNTER — Inpatient Hospital Stay: Payer: Managed Care, Other (non HMO)

## 2020-10-30 ENCOUNTER — Inpatient Hospital Stay (HOSPITAL_BASED_OUTPATIENT_CLINIC_OR_DEPARTMENT_OTHER): Payer: Managed Care, Other (non HMO) | Admitting: Oncology

## 2020-10-30 ENCOUNTER — Encounter: Payer: Self-pay | Admitting: Oncology

## 2020-10-30 VITALS — BP 124/79 | HR 87 | Temp 98.0°F | Resp 18 | Wt 243.5 lb

## 2020-10-30 DIAGNOSIS — E1365 Other specified diabetes mellitus with hyperglycemia: Secondary | ICD-10-CM

## 2020-10-30 DIAGNOSIS — C7951 Secondary malignant neoplasm of bone: Secondary | ICD-10-CM

## 2020-10-30 DIAGNOSIS — C787 Secondary malignant neoplasm of liver and intrahepatic bile duct: Secondary | ICD-10-CM

## 2020-10-30 DIAGNOSIS — G620 Drug-induced polyneuropathy: Secondary | ICD-10-CM

## 2020-10-30 DIAGNOSIS — Z5112 Encounter for antineoplastic immunotherapy: Secondary | ICD-10-CM | POA: Diagnosis not present

## 2020-10-30 DIAGNOSIS — C20 Malignant neoplasm of rectum: Secondary | ICD-10-CM | POA: Diagnosis not present

## 2020-10-30 DIAGNOSIS — Z5111 Encounter for antineoplastic chemotherapy: Secondary | ICD-10-CM

## 2020-10-30 DIAGNOSIS — C7A8 Other malignant neuroendocrine tumors: Secondary | ICD-10-CM

## 2020-10-30 DIAGNOSIS — E876 Hypokalemia: Secondary | ICD-10-CM

## 2020-10-30 DIAGNOSIS — T451X5A Adverse effect of antineoplastic and immunosuppressive drugs, initial encounter: Secondary | ICD-10-CM

## 2020-10-30 LAB — CBC WITH DIFFERENTIAL/PLATELET
Abs Immature Granulocytes: 0.05 10*3/uL (ref 0.00–0.07)
Basophils Absolute: 0 10*3/uL (ref 0.0–0.1)
Basophils Relative: 0 %
Eosinophils Absolute: 0.3 10*3/uL (ref 0.0–0.5)
Eosinophils Relative: 3 %
HCT: 42 % (ref 39.0–52.0)
Hemoglobin: 14.3 g/dL (ref 13.0–17.0)
Immature Granulocytes: 1 %
Lymphocytes Relative: 24 %
Lymphs Abs: 2.2 10*3/uL (ref 0.7–4.0)
MCH: 29.3 pg (ref 26.0–34.0)
MCHC: 34 g/dL (ref 30.0–36.0)
MCV: 86.1 fL (ref 80.0–100.0)
Monocytes Absolute: 0.9 10*3/uL (ref 0.1–1.0)
Monocytes Relative: 10 %
Neutro Abs: 5.6 10*3/uL (ref 1.7–7.7)
Neutrophils Relative %: 62 %
Platelets: 227 10*3/uL (ref 150–400)
RBC: 4.88 MIL/uL (ref 4.22–5.81)
RDW: 14.1 % (ref 11.5–15.5)
WBC: 9 10*3/uL (ref 4.0–10.5)
nRBC: 0 % (ref 0.0–0.2)

## 2020-10-30 LAB — COMPREHENSIVE METABOLIC PANEL
ALT: 15 U/L (ref 0–44)
AST: 29 U/L (ref 15–41)
Albumin: 3.6 g/dL (ref 3.5–5.0)
Alkaline Phosphatase: 91 U/L (ref 38–126)
Anion gap: 12 (ref 5–15)
BUN: 13 mg/dL (ref 6–20)
CO2: 25 mmol/L (ref 22–32)
Calcium: 9.3 mg/dL (ref 8.9–10.3)
Chloride: 98 mmol/L (ref 98–111)
Creatinine, Ser: 0.82 mg/dL (ref 0.61–1.24)
GFR, Estimated: >60 mL/min (ref >60)
Glucose, Bld: 169 mg/dL — ABNORMAL HIGH (ref 70–99)
Potassium: 3.6 mmol/L (ref 3.5–5.1)
Sodium: 135 mmol/L (ref 135–145)
Total Bilirubin: 0.7 mg/dL (ref 0.3–1.2)
Total Protein: 7.1 g/dL (ref 6.5–8.1)

## 2020-10-30 MED ORDER — SODIUM CHLORIDE 0.9% FLUSH
10.0000 mL | INTRAVENOUS | Status: DC | PRN
  Administered 2020-10-30: 10 mL via INTRAVENOUS
  Filled 2020-10-30: qty 10

## 2020-10-30 MED ORDER — FLUOROURACIL CHEMO INJECTION 2.5 GM/50ML
400.0000 mg/m2 | Freq: Once | INTRAVENOUS | Status: AC
  Administered 2020-10-30: 900 mg via INTRAVENOUS
  Filled 2020-10-30: qty 18

## 2020-10-30 MED ORDER — PALONOSETRON HCL INJECTION 0.25 MG/5ML
0.2500 mg | Freq: Once | INTRAVENOUS | Status: AC
  Administered 2020-10-30: 0.25 mg via INTRAVENOUS
  Filled 2020-10-30: qty 5

## 2020-10-30 MED ORDER — SODIUM CHLORIDE 0.9 % IV SOLN
5.0000 mg/kg | Freq: Once | INTRAVENOUS | Status: AC
  Administered 2020-10-30: 500 mg via INTRAVENOUS
  Filled 2020-10-30: qty 16

## 2020-10-30 MED ORDER — SODIUM CHLORIDE 0.9 % IV SOLN
900.0000 mg | Freq: Once | INTRAVENOUS | Status: AC
  Administered 2020-10-30: 900 mg via INTRAVENOUS
  Filled 2020-10-30: qty 25

## 2020-10-30 MED ORDER — SODIUM CHLORIDE 0.9 % IV SOLN
2400.0000 mg/m2 | INTRAVENOUS | Status: DC
  Administered 2020-10-30: 5500 mg via INTRAVENOUS
  Filled 2020-10-30: qty 110

## 2020-10-30 MED ORDER — SODIUM CHLORIDE 0.9 % IV SOLN
150.0000 mg/m2 | Freq: Once | INTRAVENOUS | Status: AC
  Administered 2020-10-30: 340 mg via INTRAVENOUS
  Filled 2020-10-30: qty 15

## 2020-10-30 MED ORDER — SODIUM CHLORIDE 0.9 % IV SOLN
10.0000 mg | Freq: Once | INTRAVENOUS | Status: AC
  Administered 2020-10-30: 10 mg via INTRAVENOUS
  Filled 2020-10-30: qty 10

## 2020-10-30 MED ORDER — ATROPINE SULFATE 1 MG/ML IJ SOLN
0.5000 mg | Freq: Once | INTRAMUSCULAR | Status: AC
  Administered 2020-10-30: 0.5 mg via INTRAVENOUS
  Filled 2020-10-30: qty 1

## 2020-10-30 MED ORDER — SODIUM CHLORIDE 0.9 % IV SOLN
Freq: Once | INTRAVENOUS | Status: AC
Start: 2020-10-30 — End: 2020-10-30
  Filled 2020-10-30: qty 250

## 2020-10-30 NOTE — Progress Notes (Signed)
Pt here for follow up. No new concerns voiced.   

## 2020-10-30 NOTE — Progress Notes (Signed)
Per MD, Dr. Tasia Catchings, order: urine protein lab does not need to be done today; ok to reference urine protein lab result from 10/16/2020 and proceed with scheduled Zirabev, Irinotecan, Leucovorin, and Fluorouracil treatment today.

## 2020-10-30 NOTE — Progress Notes (Signed)
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  #reports symptoms of upper abdomen band like discomfort and bloating for a few months, improved symptoms with omeprazole,.Chronic constipation, he has blood in the stool occationally Seen by PCP in September 2021. AlK phosphatase was recently noted to be elevated as well well elevated GGT. He was referred to see gastroenteralgias on 04/10/2020.  04/25/2020 EGD showed gastric fundus mild inflammation. Biopsy showed reactive gastropathy.  04/30/2020 [place] RUQ showed cirrhosis with multiple hepatic masses.  05/01/2020 AFP 1.7 05/02/2020 MR liver w wo contrast showed numerous heterozygous enhancing liver lesions, throughout the liver, not typical for [facility], likely metastatic disease.  Solid appearing enhancing lesion in pancreatic tail 1.9x1.5cm, suspicious for primary neoplasm.  Mild adenopathy within porta hepatic region and porta hepatic region. Splenomegaly.   Patient has history of SVT, psoriatic arthritis, morbid obesity, GERD, DM.  Family history + maternal uncle with esophageal cancer.   + unintentional weight loss, no fever, chills. "sweats a lot". Currently on antibiotics for proctitis.  He works at [employer], lives in [place]. He lives with his partner.     # 05/17/2020, colonoscopy showed a fungating and infiltrative partially obstructing large mass in the proximal rectum and in the mid rectum.  The mass was partially circumferential, involving two thirds of the lumen circumference is.  Biopsy showed adenocarcinoma.  05/23/2020, patient underwent EUS biopsy of the pancreatic tail lesion.  Biopsy pathology showed well-differentiated neuroendocrine tumor.  Ki-67 is noncontributory due to insufficient tissue in the cell block.  Genetic testing is negative.   INTERVAL HISTORY Dominic [name] is a 50 y.o. 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 Problems and complaints are listed below: Patient tolerates FOLFIRI with bevacizumab. Reports SVT episodes at home.  Currently no palpitation, chest pain, shortness of breath.  He is overdue for his appointment with cardiology No nausea vomiting.  Diarrhea symptoms are controlled Appetite is fair He has gained weight since last visit Numbness and tingling are worse recently.  Patient takes gabapentin 100 mg once daily.  Review of Systems  Constitutional: Positive for fatigue. Negative for appetite change, chills, fever and unexpected weight change.  HENT:   Negative for hearing loss and voice change.   Eyes: Negative for eye problems and icterus.  Respiratory: Negative for chest tightness, cough and shortness of breath.   Cardiovascular: Negative for chest pain, leg swelling and palpitations.  Gastrointestinal: Negative for abdominal distention and nausea.  Endocrine: Negative for hot flashes.  Genitourinary: Negative for difficulty urinating, dysuria and frequency.   Musculoskeletal: Negative for arthralgias.  Skin: Negative for itching and rash.  Neurological: Positive for numbness. Negative for light-headedness.  Hematological: Negative for adenopathy. Does not bruise/bleed easily.   Psychiatric/Behavioral: Negative for confusion.    MEDICAL HISTORY:  Past Medical  History:  Diagnosis Date  . Anxiety   . Cancer [facility])    Rectal cancer metastasized to liver ([place])  . Diabetes mellitus without complication ([place])   . Dysrhythmia    svt  . Family history of esophageal cancer   . Family history of prostate cancer   . GERD (gastroesophageal reflux disease)   . High triglycerides   . Hypertension   . Long-term insulin use in type 2 diabetes ([place])   . Morbid obesity ([place])   . Psoriatic arthritis ([place])   . SVT (supraventricular tachycardia) ([place])     SURGICAL HISTORY: Past Surgical History:  Procedure Laterality Date  . EUS N/A 05/23/2020   Procedure: FULL UPPER ENDOSCOPIC ULTRASOUND (EUS) RADIAL;  Surgeon: Jola Schmidt, MD;  Location: [facility] ENDOSCOPY;  Service: Endoscopy;  Laterality: N/A;  . PORTA CATH INSERTION N/A 05/30/2020   Procedure: PORTA CATH INSERTION;  Surgeon: Algernon Huxley, MD;  Location: [facility];  Service: Cardiovascular;  Laterality: N/A;  . UPPER GASTROINTESTINAL ENDOSCOPY      SOCIAL HISTORY: Social History   Socioeconomic History  . Marital status: Soil scientist    Spouse name: Marc Morgans  . Number of children: Not on file  . Years of education: Not on file  . Highest education level: Not on file  Occupational History  . Not on file  Tobacco Use  . Smoking status: Never Smoker  . Smokeless tobacco: Never Used  Vaping Use  . Vaping Use: Never used  Substance and Sexual Activity  . Alcohol use: Yes    Alcohol/week: 1.0 standard drink    Types: 1 Glasses of wine per week    Comment: very occasional  . Drug use: Never  . Sexual activity: Not on file  Other Topics Concern  . Not on file  Social History Narrative   Lives in [place].  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     Social Determinants of Health   Financial Resource Strain: Medium Risk  . Difficulty of Paying Living Expenses: Somewhat hard  Food Insecurity:  Not on file  Transportation Needs: Not on file  Physical Activity: Not on file  Stress: Not on file  Social Connections: Not on file  Intimate Partner Violence: Not on file    FAMILY HISTORY: Family History  Problem Relation Age of Onset  . Arthritis Mother   . Heart disease Mother   . Supraventricular tachycardia Mother        s/p ablation  . Hypertension Mother   . Diabetes Father   . Heart attack Maternal Uncle   . Throat cancer Maternal Uncle   . Esophageal cancer Maternal Uncle   . Heart attack Maternal Uncle   . Heart attack Maternal Uncle   . Prostate cancer Maternal Uncle   . Thyroid cancer Neg Hx   . Colon cancer Neg Hx   . Rectal cancer Neg Hx   . Stomach cancer Neg Hx   . Colon polyps Neg Hx     ALLERGIES:  has No Known Allergies.  MEDICATIONS:  Current Outpatient Medications  Medication Sig Dispense Refill  . gabapentin (NEURONTIN) 100 MG capsule TAKE 1 CAPSULE(100 MG) BY MOUTH THREE TIMES DAILY 90 capsule 0  . ALPRAZolam (XANAX) 0.5 MG tablet Take half tablet to one tablet by mouth as needed daily for insomnia, anxiety. 30 tablet 0  . amLODipine (NORVASC) 10 MG tablet TAKE 1 TABLET BY MOUTH  DAILY 90 tablet 3  . aspirin EC 81 MG tablet  Take 1 tablet (81 mg total) by mouth daily. Swallow whole. (Patient not taking: No sig reported) 30 tablet 11  . Continuous Blood Gluc Sensor (FREESTYLE LIBRE 2 SENSOR) MISC Scan glucose at least QID 2 each 11  . CONTOUR NEXT TEST test strip USE 1 STRIP TO TEST BLOOD  SUGAR TWICE DAILY 200 strip 3  . famotidine (PEPCID) 20 MG tablet Take 1 tablet (20 mg total) by mouth 2 (two) times daily. 120 tablet 5  . hydrochlorothiazide (HYDRODIURIL) 25 MG tablet Take 25 mg by mouth daily.    . insulin glargine (LANTUS SOLOSTAR) 100 UNIT/ML Solostar Pen Inject 56 Units into the skin daily. 51 mL 3  . insulin lispro (HUMALOG KWIKPEN) 100 UNIT/ML KwikPen Inject 18 Units into the skin 3 (three) times daily. Take with meals. Hold if premeal  glucose <100 45 mL 5  . Insulin Pen Needle (PEN NEEDLES) 32G X 6 MM MISC Used to give insulin injections 4 times daily. 100 each 10  . lactulose (CHRONULAC) 10 GM/15ML solution Take 15 mLs (10 g total) by mouth 2 (two) times daily as needed for moderate constipation or severe constipation. (Patient not taking: No sig reported) 236 mL 0  . Lancets (ONETOUCH ULTRASOFT) lancets Used to check blood sugars twice daily. 100 each 12  . lidocaine-prilocaine (EMLA) cream Apply to affected area once 30 g 3  . loperamide (IMODIUM A-D) 2 MG tablet Take 1 tablet (2 mg total) by mouth See admin instructions. Take 2 tabs at diarrhea onset , then 1 tab after every loose bowel movement. Maximum 8 tablets within 24 hour period of time (Patient not taking: Reported on 10/21/2020) 100 tablet 1  . magic mouthwash w/lidocaine SOLN Take 5 mLs by mouth 4 (four) times daily as needed for mouth pain. 80 ml viscous lidocaine 2%, 80 ml Mylanta, 80 ml Diphenhydramine 12.5 mg/5 ml Elixir, 80 ml Nystatin 100,000 Unit suspension, 80 ml Prednisolone 15 mg/48ml, 80 ml Distilled Water.  Sig: Swish/Swallow 5-10 ml four times a day as needed. (Patient not taking: Reported on 10/21/2020) 480 mL 3  . [name] Take 5 mg by mouth at bedtime as needed.  (Patient not taking: No sig reported)    . metFORMIN (GLUCOPHAGE XR) 500 MG 24 hr tablet Take 4 tablets (2,000 mg total) by mouth every evening. 120 tablet 3  . metoprolol succinate (TOPROL-XL) 100 MG 24 hr tablet TAKE 1 TABLET BY MOUTH  DAILY WITH OR IMMEDIATELY  FOLLOWING A MEAL 90 tablet 3  . Omega-3 Fatty Acids (FISH OIL PO) Take 800 mg by mouth daily.    . ondansetron (ZOFRAN ODT) 4 MG disintegrating tablet Take 1 tablet (4 mg total) by mouth every 8 (eight) hours as needed for nausea or vomiting. (Patient not taking: No sig reported) 30 tablet 2  . ondansetron (ZOFRAN) 8 MG tablet Take 1 tablet (8 mg total) by mouth 2 (two) times daily as needed for refractory nausea / vomiting. Start on  day 3 after chemotherapy. (Patient not taking: Reported on 10/21/2020) 30 tablet 1  . polyethylene glycol powder (GLYCOLAX/MIRALAX) 17 GM/SCOOP powder Take by mouth daily. 1/2 capful QD    . potassium chloride (KLOR-CON) 10 MEQ tablet Take 1 tablet (10 mEq total) by mouth daily. 30 tablet 0  . prochlorperazine (COMPAZINE) 10 MG tablet Take 1 tablet (10 mg total) by mouth every 6 (six) hours as needed (Nausea or vomiting). (Patient not taking: No sig reported) 30 tablet 1  . senna (SENOKOT) 8.6 MG  TABS tablet Take 2 tablets (17.2 mg total) by mouth daily. (Patient not taking: Reported on 10/21/2020) 120 tablet 0  . tamsulosin (FLOMAX) 0.4 MG CAPS capsule Take 1 capsule (0.4 mg total) by mouth daily. (Patient not taking: No sig reported) 30 capsule 1  . telmisartan (MICARDIS) 40 MG tablet Take 1 tablet (40 mg total) by mouth daily. PLEASE CALL OFFICE TO SCHEDULE FOLLOW UP APPOINTMENT FOR REFILLS. 30 tablet 1  . zinc gluconate 50 MG tablet Take 50 mg by mouth daily.     Current Facility-Administered Medications  Medication Dose Route Frequency Provider Last Rate Last Admin  . 0.9 %  sodium chloride infusion  500 mL Intravenous Once Doran Stabler, MD       Facility-Administered Medications Ordered in Other Visits  Medication Dose Route Frequency Provider Last Rate Last Admin  . sodium chloride flush (NS) 0.9 % injection 10 mL  10 mL Intravenous PRN Earlie Server, MD         PHYSICAL EXAMINATION: ECOG PERFORMANCE STATUS: 1 - Symptomatic but completely ambulatory Vitals:   10/30/20 0836  BP: 124/79  Pulse: 87  Resp: 18  Temp: 98 F (36.7 C)   Filed Weights   10/30/20 0836  Weight: 243 lb 8 oz (110.5 kg)    Physical Exam Constitutional:      General: He is not in acute distress. HENT:     Head: Normocephalic and atraumatic.  Eyes:     General: No scleral icterus. Cardiovascular:     Rate and Rhythm: Normal rate and regular rhythm.     Heart sounds: Normal heart sounds.  Pulmonary:      Effort: Pulmonary effort is normal. No respiratory distress.     Breath sounds: No wheezing.  Abdominal:     General: Bowel sounds are normal. There is no distension.     Palpations: Abdomen is soft.  Musculoskeletal:        General: No deformity. Normal range of motion.     Cervical back: Normal range of motion and neck supple.  Skin:    General: Skin is warm and dry.     Findings: No erythema or rash.  Neurological:     Mental Status: He is alert and oriented to person, place, and time. Mental status is at baseline.     Cranial Nerves: No cranial nerve deficit.     Coordination: Coordination normal.  Psychiatric:        Mood and Affect: Mood normal.     LABORATORY DATA:  I have reviewed the data as listed Lab Results  Component Value Date   WBC 6.6 10/16/2020   HGB 13.6 10/16/2020   HCT 39.8 10/16/2020   MCV 85.4 10/16/2020   PLT 220 10/16/2020   Recent Labs    04/10/20 1021 04/25/20 0942 05/07/20 0933 06/03/20 0815 06/21/20 0908 06/28/20 1019 09/18/20 0800 10/02/20 0825 10/16/20 0841  NA  --   --  136   < > 135   < > 134* 133* 136  K  --   --  4.4   < > 4.8   < > 3.9 3.5 3.9  CL  --   --  98   < > 95*   < > 97* 97* 100  CO2  --   --  21   < > 22   < > _0 GLUCOSE  --   --  175*   < > 202*   < > 158*  167* 186*  BUN 18  --  12   < > 11   < > _0 CREATININE 0.91  --  0.86   < > 0.80   < > 0.75 0.68 0.76  CALCIUM  --   --  9.2   < > 9.2   < > 9.0 8.8* 9.2  GFRNONAA 98  --  101   < > 104   < > >60 >60 >60  GFRAA 113  --  117  --  120  --   --   --   --   PROT  --  6.5  --    < >  --    < > 7.5 7.2 7.4  ALBUMIN  --  3.1*  --    < >  --    < > 3.2* 3.6 3.7  AST  --  18  --    < >  --    < > _1 ALT  --  17  --    < >  --    < > _2 ALKPHOS  --  215*  --    < >  --    < > 84 99 97  BILITOT  --  0.7  --    < >  --    < > 0.9 1.0 0.9  BILIDIR  --  0.47*  --   --   --   --   --   --   --    < > = values in this interval not displayed.    Iron/TIBC/Ferritin/ %Sat No results found for: IRON, TIBC, FERRITIN, IRONPCTSAT    RADIOGRAPHIC STUDIES: I have personally reviewed the radiological images as listed and agreed with the findings in the report. No results found.    ASSESSMENT & PLAN:  1. Rectal cancer metastasized to liver ([place])   2. Encounter for antineoplastic chemotherapy   3. Neuroendocrine carcinoma of pancreas ([place])   4. Neuropathy due to chemotherapeutic drug ([place])   5. Hypokalemia   6. Uncontrolled other specified diabetes mellitus with hyperglycemia ([place])   Cancer Staging Neuroendocrine carcinoma of pancreas ([place]) Staging form: Exocrine Pancreas, AJCC 8th Edition - Clinical: Stage IB (cT2, cN0, cM0) - Signed by Earlie Server, MD on 09/11/2020  Rectal cancer metastasized to liver [facility]) Staging form: Colon and Rectum, AJCC 8th Edition - Clinical stage from 06/03/2020: Stage Unknown (cTX, cNX, pM1) - Signed by Earlie Server, MD on 06/03/2020   #Stage IV rectal cancer  first-line palliative chemotherapy with FOLFOX and bevacizumab.-Infusion reaction to oxaliplatin during cycle 8 FOLFOX bevacizumab. Currently on second line treatment FOLFIRI with bevacizumab .  Labs are reviewed and discussed with patient .  Proceed with FOLFIRI with bevacizumab today.  Irinotecan 150 mg per metered squared.   #Pancreatic well differentiated neuroendocrine carcinoma Dotatate PET scan was reviewed and discussed with patient.  Lesion has grown in size now 3cm.  He is asymptomatic from neuroendocrine carcinoma at this point.  I will hold off starting lanreotide. May consider in the future.  # Chemotherapy induced neuropathy. Grade 1  Symptoms are worsened.  Recommend patient increase gabapentin 200 mg 3 times daily. # Hypokalemia, K 3.6 , continue potassium chloride 10 mEq daily.   # Uncontrolled diabetes,  continue follow-up with primary care provider for glycemic control.  #Episode of SVT.  Recommend patient to follow-up with  cardiology.  All questions were answered. The patient  knows to call the clinic with any problems questions or concerns.  cc Burnard Hawthorne, FNP    Return of visit: 2 weeks  Earlie Server, MD, PhD Hematology Oncology [facility] at [facility] Pager- [phone] 10/30/2020

## 2020-10-31 LAB — CEA: CEA: 1285 ng/mL — ABNORMAL HIGH (ref 0.0–4.7)

## 2020-11-01 ENCOUNTER — Other Ambulatory Visit: Payer: Self-pay

## 2020-11-01 ENCOUNTER — Inpatient Hospital Stay: Payer: Managed Care, Other (non HMO)

## 2020-11-01 DIAGNOSIS — Z5112 Encounter for antineoplastic immunotherapy: Secondary | ICD-10-CM | POA: Diagnosis not present

## 2020-11-01 DIAGNOSIS — C20 Malignant neoplasm of rectum: Secondary | ICD-10-CM

## 2020-11-01 DIAGNOSIS — C787 Secondary malignant neoplasm of liver and intrahepatic bile duct: Secondary | ICD-10-CM

## 2020-11-01 MED ORDER — HEPARIN SOD (PORK) LOCK FLUSH 100 UNIT/ML IV SOLN
INTRAVENOUS | Status: AC
  Filled 2020-11-01: qty 5

## 2020-11-01 MED ORDER — SODIUM CHLORIDE 0.9% FLUSH
10.0000 mL | INTRAVENOUS | Status: DC | PRN
  Administered 2020-11-01: 10 mL
  Filled 2020-11-01: qty 10

## 2020-11-01 MED ORDER — HEPARIN SOD (PORK) LOCK FLUSH 100 UNIT/ML IV SOLN
500.0000 [IU] | Freq: Once | INTRAVENOUS | Status: AC | PRN
  Administered 2020-11-01: 500 [IU]
  Filled 2020-11-01: qty 5

## 2020-11-13 ENCOUNTER — Inpatient Hospital Stay (HOSPITAL_BASED_OUTPATIENT_CLINIC_OR_DEPARTMENT_OTHER): Payer: Managed Care, Other (non HMO) | Admitting: Oncology

## 2020-11-13 ENCOUNTER — Inpatient Hospital Stay: Payer: Managed Care, Other (non HMO)

## 2020-11-13 ENCOUNTER — Encounter: Payer: Self-pay | Admitting: Oncology

## 2020-11-13 VITALS — BP 149/81 | HR 87 | Temp 97.3°F | Resp 18 | Wt 249.5 lb

## 2020-11-13 VITALS — BP 162/79 | HR 84 | Resp 18

## 2020-11-13 DIAGNOSIS — Z8042 Family history of malignant neoplasm of prostate: Secondary | ICD-10-CM | POA: Insufficient documentation

## 2020-11-13 DIAGNOSIS — C787 Secondary malignant neoplasm of liver and intrahepatic bile duct: Secondary | ICD-10-CM

## 2020-11-13 DIAGNOSIS — Z7189 Other specified counseling: Secondary | ICD-10-CM

## 2020-11-13 DIAGNOSIS — C7951 Secondary malignant neoplasm of bone: Secondary | ICD-10-CM

## 2020-11-13 DIAGNOSIS — Z5112 Encounter for antineoplastic immunotherapy: Secondary | ICD-10-CM | POA: Insufficient documentation

## 2020-11-13 DIAGNOSIS — Z5111 Encounter for antineoplastic chemotherapy: Secondary | ICD-10-CM

## 2020-11-13 DIAGNOSIS — Z801 Family history of malignant neoplasm of trachea, bronchus and lung: Secondary | ICD-10-CM | POA: Insufficient documentation

## 2020-11-13 DIAGNOSIS — E1365 Other specified diabetes mellitus with hyperglycemia: Secondary | ICD-10-CM

## 2020-11-13 DIAGNOSIS — C20 Malignant neoplasm of rectum: Secondary | ICD-10-CM | POA: Insufficient documentation

## 2020-11-13 DIAGNOSIS — E119 Type 2 diabetes mellitus without complications: Secondary | ICD-10-CM | POA: Diagnosis not present

## 2020-11-13 DIAGNOSIS — T451X5A Adverse effect of antineoplastic and immunosuppressive drugs, initial encounter: Secondary | ICD-10-CM

## 2020-11-13 DIAGNOSIS — Z794 Long term (current) use of insulin: Secondary | ICD-10-CM | POA: Insufficient documentation

## 2020-11-13 DIAGNOSIS — E876 Hypokalemia: Secondary | ICD-10-CM

## 2020-11-13 DIAGNOSIS — G620 Drug-induced polyneuropathy: Secondary | ICD-10-CM | POA: Insufficient documentation

## 2020-11-13 DIAGNOSIS — C7A098 Malignant carcinoid tumors of other sites: Secondary | ICD-10-CM | POA: Insufficient documentation

## 2020-11-13 DIAGNOSIS — Z79899 Other long term (current) drug therapy: Secondary | ICD-10-CM | POA: Insufficient documentation

## 2020-11-13 DIAGNOSIS — E1165 Type 2 diabetes mellitus with hyperglycemia: Secondary | ICD-10-CM | POA: Insufficient documentation

## 2020-11-13 DIAGNOSIS — Z7982 Long term (current) use of aspirin: Secondary | ICD-10-CM | POA: Insufficient documentation

## 2020-11-13 DIAGNOSIS — I471 Supraventricular tachycardia: Secondary | ICD-10-CM | POA: Insufficient documentation

## 2020-11-13 DIAGNOSIS — C7A8 Other malignant neuroendocrine tumors: Secondary | ICD-10-CM | POA: Diagnosis not present

## 2020-11-13 DIAGNOSIS — Z8249 Family history of ischemic heart disease and other diseases of the circulatory system: Secondary | ICD-10-CM | POA: Insufficient documentation

## 2020-11-13 DIAGNOSIS — I10 Essential (primary) hypertension: Secondary | ICD-10-CM | POA: Diagnosis not present

## 2020-11-13 DIAGNOSIS — K219 Gastro-esophageal reflux disease without esophagitis: Secondary | ICD-10-CM | POA: Insufficient documentation

## 2020-11-13 DIAGNOSIS — Z833 Family history of diabetes mellitus: Secondary | ICD-10-CM | POA: Insufficient documentation

## 2020-11-13 LAB — CBC WITH DIFFERENTIAL/PLATELET
Abs Immature Granulocytes: 0.03 10*3/uL (ref 0.00–0.07)
Basophils Absolute: 0 10*3/uL (ref 0.0–0.1)
Basophils Relative: 0 %
Eosinophils Absolute: 0.2 10*3/uL (ref 0.0–0.5)
Eosinophils Relative: 3 %
HCT: 40.6 % (ref 39.0–52.0)
Hemoglobin: 14 g/dL (ref 13.0–17.0)
Immature Granulocytes: 0 %
Lymphocytes Relative: 29 %
Lymphs Abs: 2.1 10*3/uL (ref 0.7–4.0)
MCH: 29.9 pg (ref 26.0–34.0)
MCHC: 34.5 g/dL (ref 30.0–36.0)
MCV: 86.8 fL (ref 80.0–100.0)
Monocytes Absolute: 0.6 10*3/uL (ref 0.1–1.0)
Monocytes Relative: 8 %
Neutro Abs: 4.2 10*3/uL (ref 1.7–7.7)
Neutrophils Relative %: 60 %
Platelets: 197 10*3/uL (ref 150–400)
RBC: 4.68 MIL/uL (ref 4.22–5.81)
RDW: 14.4 % (ref 11.5–15.5)
WBC: 7.1 10*3/uL (ref 4.0–10.5)
nRBC: 0 % (ref 0.0–0.2)

## 2020-11-13 LAB — COMPREHENSIVE METABOLIC PANEL
ALT: 21 U/L (ref 0–44)
AST: 32 U/L (ref 15–41)
Albumin: 3.7 g/dL (ref 3.5–5.0)
Alkaline Phosphatase: 89 U/L (ref 38–126)
Anion gap: 12 (ref 5–15)
BUN: 15 mg/dL (ref 6–20)
CO2: 27 mmol/L (ref 22–32)
Calcium: 9.2 mg/dL (ref 8.9–10.3)
Chloride: 98 mmol/L (ref 98–111)
Creatinine, Ser: 0.8 mg/dL (ref 0.61–1.24)
GFR, Estimated: >60 mL/min (ref >60)
Glucose, Bld: 275 mg/dL — ABNORMAL HIGH (ref 70–99)
Potassium: 3.9 mmol/L (ref 3.5–5.1)
Sodium: 137 mmol/L (ref 135–145)
Total Bilirubin: 0.6 mg/dL (ref 0.3–1.2)
Total Protein: 7.3 g/dL (ref 6.5–8.1)

## 2020-11-13 LAB — PROTEIN, URINE, RANDOM: Total Protein, Urine: <6 mg/dL

## 2020-11-13 MED ORDER — POTASSIUM CHLORIDE ER 10 MEQ PO TBCR: 10.0000 meq | EXTENDED_RELEASE_TABLET | Freq: Every day | ORAL | 0 refills | Status: DC

## 2020-11-13 MED ORDER — BEVACIZUMAB-BVZR CHEMO INJECTION 400 MG/16ML
5.0000 mg/kg | Freq: Once | INTRAVENOUS | Status: AC
Start: 2020-11-13 — End: 2020-11-13
  Administered 2020-11-13: 600 mg via INTRAVENOUS
  Filled 2020-11-13: qty 16

## 2020-11-13 MED ORDER — SODIUM CHLORIDE 0.9 % IV SOLN
2400.0000 mg/m2 | INTRAVENOUS | Status: DC
  Administered 2020-11-13: 5500 mg via INTRAVENOUS
  Filled 2020-11-13: qty 110

## 2020-11-13 MED ORDER — SODIUM CHLORIDE 0.9 % IV SOLN
150.0000 mg/m2 | Freq: Once | INTRAVENOUS | Status: AC
  Administered 2020-11-13: 340 mg via INTRAVENOUS
  Filled 2020-11-13: qty 15

## 2020-11-13 MED ORDER — SODIUM CHLORIDE 0.9 % IV SOLN
Freq: Once | INTRAVENOUS | Status: AC
Start: 2020-11-13 — End: 2020-11-13
  Filled 2020-11-13: qty 250

## 2020-11-13 MED ORDER — SODIUM CHLORIDE 0.9 % IV SOLN
900.0000 mg | Freq: Once | INTRAVENOUS | Status: AC
  Administered 2020-11-13: 900 mg via INTRAVENOUS
  Filled 2020-11-13: qty 25

## 2020-11-13 MED ORDER — PALONOSETRON HCL INJECTION 0.25 MG/5ML
0.2500 mg | Freq: Once | INTRAVENOUS | Status: AC
  Administered 2020-11-13: 0.25 mg via INTRAVENOUS
  Filled 2020-11-13: qty 5

## 2020-11-13 MED ORDER — FLUOROURACIL CHEMO INJECTION 2.5 GM/50ML
400.0000 mg/m2 | Freq: Once | INTRAVENOUS | Status: AC
  Administered 2020-11-13: 900 mg via INTRAVENOUS
  Filled 2020-11-13: qty 18

## 2020-11-13 MED ORDER — SODIUM CHLORIDE 0.9 % IV SOLN
10.0000 mg | Freq: Once | INTRAVENOUS | Status: AC
  Administered 2020-11-13: 10 mg via INTRAVENOUS
  Filled 2020-11-13: qty 10

## 2020-11-13 MED ORDER — SODIUM CHLORIDE 0.9 % IV SOLN: 5.0000 mg/kg | Freq: Once | INTRAVENOUS | Status: DC

## 2020-11-13 MED ORDER — ATROPINE SULFATE 1 MG/ML IJ SOLN
0.5000 mg | Freq: Once | INTRAMUSCULAR | Status: AC
  Administered 2020-11-13: 0.5 mg via INTRAVENOUS
  Filled 2020-11-13: qty 1

## 2020-11-13 MED ORDER — OMEPRAZOLE 20 MG PO CPDR: 20.0000 mg | DELAYED_RELEASE_CAPSULE | Freq: Two times a day (BID) | ORAL | 0 refills | Status: AC

## 2020-11-13 NOTE — Progress Notes (Signed)
Per Dr. Tasia Catchings, Okay to proceed with irinotecan and Leucovorin prior to [place]. Waiting on urine protein at this time.   1235: Pt "tripped over my feet" Pt reports that he hit his knees and right hand only. Pt denies any concerns or extreme pain. Beckey Rutter NP aware.  1250: Beckey Rutter NP aware and at chairside. Pt continues to deny any concerns. (pain remains a 1 out of 10 for bilateral knees and right hand).  Per Beckey Rutter NP okay to discharge pt home and pt educated to call clinic with any concerns or report to the ER in the event of an emergency.  ** see flow sheets for VS, Beckey Rutter NP aware of all VS during this time. **  Pt stable at discharge and denies any concerns at this time. No mobility concerns noted. No s/s of distress noted.

## 2020-11-13 NOTE — Patient Instructions (Signed)
[facility] [place] [facility]  Discharge Instructions: Thank you for choosing [facility] to provide your oncology and hematology care.  If you have a lab appointment with the Cancer Center, please go directly to the Cancer Center and check in at the registration area.  Wear comfortable clothing and clothing appropriate for easy access to any Portacath or PICC line.   We strive to give you quality time with your provider. You may need to reschedule your appointment if you arrive late (15 or more minutes).  Arriving late affects you and other patients whose appointments are after yours.  Also, if you miss three or more appointments without notifying the office, you may be dismissed from the clinic at the provider's discretion.      For prescription refill requests, have your pharmacy contact our office and allow 72 hours for refills to be completed.    Today you received the following chemotherapy and/or immunotherapy agents Zirabev, Irinotecan, Leucovorin and Adrucil       To help prevent nausea and vomiting after your treatment, we encourage you to take your nausea medication as directed.  BELOW ARE SYMPTOMS THAT SHOULD BE REPORTED IMMEDIATELY: *FEVER GREATER THAN 100.4 F (38 C) OR HIGHER *CHILLS OR SWEATING *NAUSEA AND VOMITING THAT IS NOT CONTROLLED WITH YOUR NAUSEA MEDICATION *UNUSUAL SHORTNESS OF BREATH *UNUSUAL BRUISING OR BLEEDING *URINARY PROBLEMS (pain or burning when urinating, or frequent urination) *BOWEL PROBLEMS (unusual diarrhea, constipation, pain near the anus) TENDERNESS IN MOUTH AND THROAT WITH OR WITHOUT PRESENCE OF ULCERS (sore throat, sores in mouth, or a toothache) UNUSUAL RASH, SWELLING OR PAIN  UNUSUAL VAGINAL DISCHARGE OR ITCHING   Items with * indicate a potential emergency and should be followed up as soon as possible or go to the Emergency Department if any problems should occur.  Please show the CHEMOTHERAPY ALERT CARD or  IMMUNOTHERAPY ALERT CARD at check-in to the Emergency Department and triage nurse.  Should you have questions after your visit or need to cancel or reschedule your appointment, please contact [facility] C[facility] [place] [facility]  [phone] and follow the prompts.  Office hours are 8:00 a.m. to 4:30 p.m. Monday - Friday. Please note that voicemails left after 4:00 p.m. may not be returned until the following business day.  We are closed weekends and major holidays. You have access to a nurse at all times for urgent questions. Please call the main number to the clinic [phone] and follow the prompts.  For any non-urgent questions, you may also contact your provider using MyChart. We now offer e-Visits for anyone 18 and older to request care online for non-urgent symptoms. For details visit [email or web address].   Also download the MyChart app! Go to the app store, search "MyChart", open the app, select [facility], and log in with your MyChart username and password.  Due to Covid, a mask is required upon entering the hospital/clinic. If you do not have a mask, one will be given to you upon arrival. For doctor visits, patients may have 1 support person aged 18 or older with them. For treatment visits, patients cannot have anyone with them due to current Covid guidelines and our immunocompromised population.  

## 2020-11-13 NOTE — Progress Notes (Signed)
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  #reports symptoms of upper abdomen band like discomfort and bloating for a few months, improved symptoms with omeprazole,.Chronic constipation, he has blood in the stool occationally Seen by PCP in September 2021. AlK phosphatase was recently noted to be elevated as well well elevated GGT. He was referred to see gastroenteralgias on 04/10/2020.  04/25/2020 EGD showed gastric fundus mild inflammation. Biopsy showed reactive gastropathy.  04/30/2020 [place] RUQ showed cirrhosis with multiple hepatic masses.  05/01/2020 AFP 1.7 05/02/2020 MR liver w wo contrast showed numerous heterozygous enhancing liver lesions, throughout the liver, not typical for [facility], likely metastatic disease.  Solid appearing enhancing lesion in pancreatic tail 1.9x1.5cm, suspicious for primary neoplasm.  Mild adenopathy within porta hepatic region and porta hepatic region. Splenomegaly.   Patient has history of SVT, psoriatic arthritis, morbid obesity, GERD, DM.  Family history + maternal uncle with esophageal cancer.   + unintentional weight loss, no fever, chills. "sweats a lot". Currently on antibiotics for proctitis.  He works at [employer], lives in [place]. He lives with his partner.     # 05/17/2020, colonoscopy showed a fungating and infiltrative partially obstructing large mass in the proximal rectum and in the mid rectum.  The mass was partially circumferential, involving two thirds of the lumen circumference is.  Biopsy showed adenocarcinoma.  05/23/2020, patient underwent EUS biopsy of the pancreatic tail lesion.  Biopsy pathology showed well-differentiated neuroendocrine tumor.  Ki-67 is noncontributory due to insufficient tissue in the cell block.  # Genetic testing is negative.  #06/03/2020  Start FOLFOX and bevacizumab. #09/18/2020 Infusion reaction to oxaliplatin during cycle 8 FOLFOX bevacizumab. # 10/02/2020 FOLFIRI +Bevacitzumab Patient has had intermittent SVT symptoms.  He is overdue for his appointment with cardiology.  INTERVAL HISTORY Dominic [name] is a 50 y.o. 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 Problems and complaints are listed below: Patient tolerates FOLFIRI with bevacizumab. Patient has gained weight. Today he reports burping/acid reflux symptoms are worse.  He has gained weight during interval. Numbness and tingling  takes gabapentin symptoms are stable  Review of Systems  Constitutional: Positive for fatigue. Negative for appetite change, chills, fever and unexpected weight change.  HENT:   Negative for hearing loss and voice change.   Eyes: Negative for eye problems and icterus.  Respiratory: Negative for chest tightness, cough and shortness of breath.   Cardiovascular: Negative for chest pain, leg swelling and palpitations.  Gastrointestinal: Negative for abdominal distention and nausea.       Burping  Endocrine: Negative for hot flashes.  Genitourinary: Negative for difficulty urinating, dysuria and frequency.   Musculoskeletal: Negative for arthralgias.  Skin: Negative for itching and rash.  Neurological: Positive  for numbness. Negative for light-headedness.  Hematological: Negative for adenopathy. Does  not bruise/bleed easily.  Psychiatric/Behavioral: Negative for confusion.    MEDICAL HISTORY:  Past Medical History:  Diagnosis Date  . Anxiety   . Cancer [facility])    Rectal cancer metastasized to liver ([place])  . Diabetes mellitus without complication ([place])   . Dysrhythmia    svt  . Family history of esophageal cancer   . Family history of prostate cancer   . GERD (gastroesophageal reflux disease)   . High triglycerides   . Hypertension   . Long-term insulin use in type 2 diabetes ([place])   . Morbid obesity ([place])   . Psoriatic arthritis ([place])   . SVT (supraventricular tachycardia) ([place])     SURGICAL HISTORY: Past Surgical History:  Procedure Laterality Date  . EUS N/A 05/23/2020   Procedure: FULL UPPER ENDOSCOPIC ULTRASOUND (EUS) RADIAL;  Surgeon: Jola Schmidt, MD;  Location: [facility] ENDOSCOPY;  Service: Endoscopy;  Laterality: N/A;  . PORTA CATH INSERTION N/A 05/30/2020   Procedure: PORTA CATH INSERTION;  Surgeon: Algernon Huxley, MD;  Location: [place] CV LAB;  Service: Cardiovascular;  Laterality: N/A;  . UPPER GASTROINTESTINAL ENDOSCOPY      SOCIAL HISTORY: Social History   Socioeconomic History  . Marital status: Soil scientist    Spouse name: Marc Morgans  . Number of children: Not on file  . Years of education: Not on file  . Highest education level: Not on file  Occupational History  . Not on file  Tobacco Use  . Smoking status: Never Smoker  . Smokeless tobacco: Never Used  Vaping Use  . Vaping Use: Never used  Substance and Sexual Activity  . Alcohol use: Yes    Alcohol/week: 1.0 standard drink    Types: 1 Glasses of wine per week    Comment: very occasional  . Drug use: Never  . Sexual activity: Not on file  Other Topics Concern  . Not on file  Social History Narrative   Lives in [place] partner [place].  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     Social Determinants of Health    Financial Resource Strain: Medium Risk  . Difficulty of Paying Living Expenses: Somewhat hard  Food Insecurity: Not on file  Transportation Needs: Not on file  Physical Activity: Not on file  Stress: Not on file  Social Connections: Not on file  Intimate Partner Violence: Not on file    FAMILY HISTORY: Family History  Problem Relation Age of Onset  . Arthritis Mother   . Heart disease Mother   . Supraventricular tachycardia Mother        s/p ablation  . Hypertension Mother   . Diabetes Father   . Heart attack Maternal Uncle   . Throat cancer Maternal Uncle   . Esophageal cancer Maternal Uncle   . Heart attack Maternal Uncle   . Heart attack Maternal Uncle   . Prostate cancer Maternal Uncle   . Thyroid cancer Neg Hx   . Colon cancer Neg Hx   . Rectal cancer Neg Hx   . Stomach cancer Neg Hx   . Colon polyps Neg Hx     ALLERGIES:  has No Known Allergies.  MEDICATIONS:  Current Outpatient Medications  Medication Sig Dispense Refill  . ALPRAZolam (XANAX) 0.5 MG tablet Take half tablet to one tablet by mouth as needed daily for insomnia, anxiety. 30 tablet 0  . amLODipine (NORVASC) 10 MG tablet TAKE 1 TABLET BY MOUTH  DAILY 90 tablet 3  . aspirin EC 81 MG tablet Take 1 tablet (  81 mg total) by mouth daily. Swallow whole. (Patient not taking: No sig reported) 30 tablet 11  . Continuous Blood Gluc Sensor (FREESTYLE LIBRE 2 SENSOR) MISC Scan glucose at least QID 2 each 11  . CONTOUR NEXT TEST test strip USE 1 STRIP TO TEST BLOOD  SUGAR TWICE DAILY 200 strip 3  . famotidine (PEPCID) 20 MG tablet Take 1 tablet (20 mg total) by mouth 2 (two) times daily. 120 tablet 5  . gabapentin (NEURONTIN) 100 MG capsule TAKE 1 CAPSULE(100 MG) BY MOUTH THREE TIMES DAILY (Patient taking differently: Take 100 mg by mouth daily at 6 (six) AM.) 90 capsule 0  . hydrochlorothiazide (HYDRODIURIL) 25 MG tablet Take 25 mg by mouth daily.    . insulin glargine (LANTUS SOLOSTAR) 100 UNIT/ML Solostar Pen  Inject 56 Units into the skin daily. (Patient taking differently: Inject 58 Units into the skin daily.) 51 mL 3  . insulin lispro (HUMALOG KWIKPEN) 100 UNIT/ML KwikPen Inject 18 Units into the skin 3 (three) times daily. Take with meals. Hold if premeal glucose <100 45 mL 5  . Insulin Pen Needle (PEN NEEDLES) 32G X 6 MM MISC Used to give insulin injections 4 times daily. 100 each 10  . lactulose (CHRONULAC) 10 GM/15ML solution Take 15 mLs (10 g total) by mouth 2 (two) times daily as needed for moderate constipation or severe constipation. (Patient not taking: No sig reported) 236 mL 0  . Lancets (ONETOUCH ULTRASOFT) lancets Used to check blood sugars twice daily. 100 each 12  . lidocaine-prilocaine (EMLA) cream Apply to affected area once 30 g 3  . loperamide (IMODIUM A-D) 2 MG tablet Take 1 tablet (2 mg total) by mouth See admin instructions. Take 2 tabs at diarrhea onset , then 1 tab after every loose bowel movement. Maximum 8 tablets within 24 hour period of time (Patient not taking: No sig reported) 100 tablet 1  . magic mouthwash w/lidocaine SOLN Take 5 mLs by mouth 4 (four) times daily as needed for mouth pain. 80 ml viscous lidocaine 2%, 80 ml Mylanta, 80 ml Diphenhydramine 12.5 mg/5 ml Elixir, 80 ml Nystatin 100,000 Unit suspension, 80 ml Prednisolone 15 mg/56m, 80 ml Distilled Water.  Sig: Swish/Swallow 5-10 ml four times a day as needed. (Patient not taking: No sig reported) 480 mL 3  . [name] Take 5 mg by mouth at bedtime as needed.  (Patient not taking: No sig reported)    . metFORMIN (GLUCOPHAGE XR) 500 MG 24 hr tablet Take 4 tablets (2,000 mg total) by mouth every evening. 120 tablet 3  . metoprolol succinate (TOPROL-XL) 100 MG 24 hr tablet TAKE 1 TABLET BY MOUTH  DAILY WITH OR IMMEDIATELY  FOLLOWING A MEAL 90 tablet 3  . Omega-3 Fatty Acids (FISH OIL PO) Take 800 mg by mouth daily.    . ondansetron (ZOFRAN ODT) 4 MG disintegrating tablet Take 1 tablet (4 mg total) by mouth every 8  (eight) hours as needed for nausea or vomiting. (Patient not taking: No sig reported) 30 tablet 2  . ondansetron (ZOFRAN) 8 MG tablet Take 1 tablet (8 mg total) by mouth 2 (two) times daily as needed for refractory nausea / vomiting. Start on day 3 after chemotherapy. (Patient not taking: No sig reported) 30 tablet 1  . polyethylene glycol powder (GLYCOLAX/MIRALAX) 17 GM/SCOOP powder Take by mouth daily. 1/2 capful QD (Patient not taking: Reported on 10/30/2020)    . potassium chloride (KLOR-CON) 10 MEQ tablet Take 1 tablet (10 mEq total)  by mouth daily. 30 tablet 0  . prochlorperazine (COMPAZINE) 10 MG tablet Take 1 tablet (10 mg total) by mouth every 6 (six) hours as needed (Nausea or vomiting). (Patient not taking: No sig reported) 30 tablet 1  . senna (SENOKOT) 8.6 MG TABS tablet Take 2 tablets (17.2 mg total) by mouth daily. (Patient not taking: No sig reported) 120 tablet 0  . tamsulosin (FLOMAX) 0.4 MG CAPS capsule Take 1 capsule (0.4 mg total) by mouth daily. (Patient not taking: No sig reported) 30 capsule 1  . telmisartan (MICARDIS) 40 MG tablet Take 1 tablet (40 mg total) by mouth daily. PLEASE CALL OFFICE TO SCHEDULE FOLLOW UP APPOINTMENT FOR REFILLS. 30 tablet 1  . zinc gluconate 50 MG tablet Take 50 mg by mouth daily.     Current Facility-Administered Medications  Medication Dose Route Frequency Provider Last Rate Last Admin  . 0.9 %  sodium chloride infusion  500 mL Intravenous Once Nelida Meuse III, MD         PHYSICAL EXAMINATION: ECOG PERFORMANCE STATUS: 1 - Symptomatic but completely ambulatory Vitals:   11/13/20 0859  BP: (!) 149/81  Pulse: 87  Resp: 18  Temp: (!) 97.3 F (36.3 C)   Filed Weights   11/13/20 0859  Weight: 249 lb 8 oz (113.2 kg)    Physical Exam Constitutional:      General: He is not in acute distress. HENT:     Head: Normocephalic and atraumatic.  Eyes:     General: No scleral icterus. Cardiovascular:     Rate and Rhythm: Normal rate and  regular rhythm.     Heart sounds: Normal heart sounds.  Pulmonary:     Effort: Pulmonary effort is normal. No respiratory distress.     Breath sounds: No wheezing.  Abdominal:     General: Bowel sounds are normal. There is no distension.     Palpations: Abdomen is soft.  Musculoskeletal:        General: No deformity. Normal range of motion.     Cervical back: Normal range of motion and neck supple.  Skin:    General: Skin is warm and dry.     Findings: No erythema or rash.  Neurological:     Mental Status: He is alert and oriented to person, place, and time. Mental status is at baseline.     Cranial Nerves: No cranial nerve deficit.     Coordination: Coordination normal.  Psychiatric:        Mood and Affect: Mood normal.     LABORATORY DATA:  I have reviewed the data as listed Lab Results  Component Value Date   WBC 9.0 10/30/2020   HGB 14.3 10/30/2020   HCT 42.0 10/30/2020   MCV 86.1 10/30/2020   PLT 227 10/30/2020   Recent Labs    04/10/20 1021 04/25/20 0942 05/07/20 0933 06/03/20 0815 06/21/20 0908 06/28/20 1019 10/02/20 0825 10/16/20 0841 10/30/20 0816  NA  --   --  136   < > 135   < > 133* 136 135  K  --   --  4.4   < > 4.8   < > 3.5 3.9 3.6  CL  --   --  98   < > 95*   < > 97* 100 98  CO2  --   --  21   < > 22   < > _0 GLUCOSE  --   --  175*   < >  202*   < > 167* 186* 169*  BUN 18  --  12   < > 11   < > _0 CREATININE 0.91  --  0.86   < > 0.80   < > 0.68 0.76 0.82  CALCIUM  --   --  9.2   < > 9.2   < > 8.8* 9.2 9.3  GFRNONAA 98  --  101   < > 104   < > >60 >60 >60  GFRAA 113  --  117  --  120  --   --   --   --   PROT  --  6.5  --    < >  --    < > 7.2 7.4 7.1  ALBUMIN  --  3.1*  --    < >  --    < > 3.6 3.7 3.6  AST  --  18  --    < >  --    < > _1 ALT  --  17  --    < >  --    < > _2 ALKPHOS  --  215*  --    < >  --    < > 99 97 91  BILITOT  --  0.7  --    < >  --    < > 1.0 0.9 0.7  BILIDIR  --  0.47*  --   --   --   --    --   --   --    < > = values in this interval not displayed.   Iron/TIBC/Ferritin/ %Sat No results found for: IRON, TIBC, FERRITIN, IRONPCTSAT    RADIOGRAPHIC STUDIES: I have personally reviewed the radiological images as listed and agreed with the findings in the report. No results found.    ASSESSMENT & PLAN:  1. Rectal cancer metastasized to liver ([place])   2. Encounter for antineoplastic chemotherapy   3. Neuroendocrine carcinoma of pancreas ([place])   4. Neuropathy due to chemotherapeutic drug ([place])   5. Hypokalemia   6. Uncontrolled other specified diabetes mellitus with hyperglycemia ([place])   Cancer Staging Neuroendocrine carcinoma of pancreas ([place]) Staging form: Exocrine Pancreas, AJCC 8th Edition - Clinical: Stage IB (cT2, cN0, cM0) - Signed by Earlie Server, MD on 09/11/2020  Rectal cancer metastasized to liver [facility]) Staging form: Colon and Rectum, AJCC 8th Edition - Clinical stage from 06/03/2020: Stage Unknown (cTX, cNX, pM1) - Signed by Earlie Server, MD on 06/03/2020   #Stage IV rectal cancer  first-line palliative chemotherapy with FOLFOX and bevacizumab.-Infusion reaction to oxaliplatin during cycle 8 FOLFOX bevacizumab. Currently on second line treatment FOLFIRI with bevacizumab Labs are reviewed and discussed with patient. Proceed with FOLFIRI with bevacizumab today.  Irinotecan 150 mg/m2 CEA increased.  Today CEA is pending.  Discussed about second opinion at [facility] and he agrees.  I will refer him to establish care with [facility] in the near future.  #Pancreatic well differentiated neuroendocrine carcinoma Dotatate PET scan was reviewed and discussed with patient.  Lesion has grown in size now 3cm.  He is asymptomatic from neuroendocrine carcinoma at this point.  I will hold off starting lanreotide. May consider in the future if he develops progressive symptoms. #GERD, discontinue famotidine.  Switch to omeprazole 20 mg twice daily. # Chemotherapy induced neuropathy.  Grade 1  Symptoms are worsened.  Continue gabapentin 200 mg 3 times daily. #  Hypokalemia, K 3.6 , continue potassium chloride 10 mEq daily.   # Uncontrolled diabetes,  continue follow-up with primary care provider for glycemic control.  Glucose 275 today #Episode of SVT.  Recommend patient to follow-up with cardiology.  All questions were answered. The patient knows to call the clinic with any problems questions or concerns.  cc Burnard Hawthorne, FNP    Return of visit: 2 weeks  Earlie Server, MD, PhD Hematology Oncology [facility] Pager- [phone] 11/13/2020

## 2020-11-13 NOTE — Progress Notes (Signed)
Patient reports his neuropathy is stable.  Still has GI issues of GERD, feeling like has a "lump" in throat, and bloated.

## 2020-11-14 LAB — CEA: CEA: 943 ng/mL — ABNORMAL HIGH (ref 0.0–4.7)

## 2020-11-15 ENCOUNTER — Inpatient Hospital Stay: Payer: Managed Care, Other (non HMO)

## 2020-11-15 ENCOUNTER — Telehealth: Payer: Self-pay | Admitting: Family

## 2020-11-15 VITALS — BP 152/86 | HR 90 | Resp 18

## 2020-11-15 DIAGNOSIS — C20 Malignant neoplasm of rectum: Secondary | ICD-10-CM

## 2020-11-15 DIAGNOSIS — Z5112 Encounter for antineoplastic immunotherapy: Secondary | ICD-10-CM | POA: Diagnosis not present

## 2020-11-15 MED ORDER — HEPARIN SOD (PORK) LOCK FLUSH 100 UNIT/ML IV SOLN
500.0000 [IU] | Freq: Once | INTRAVENOUS | Status: AC | PRN
  Administered 2020-11-15: 500 [IU]
  Filled 2020-11-15: qty 5

## 2020-11-15 MED ORDER — HEPARIN SOD (PORK) LOCK FLUSH 100 UNIT/ML IV SOLN
INTRAVENOUS | Status: AC
  Filled 2020-11-15: qty 5

## 2020-11-15 MED ORDER — SODIUM CHLORIDE 0.9% FLUSH
10.0000 mL | INTRAVENOUS | Status: DC | PRN
  Administered 2020-11-15: 10 mL
  Filled 2020-11-15: qty 10

## 2020-11-15 NOTE — Telephone Encounter (Signed)
noted 

## 2020-11-15 NOTE — Progress Notes (Signed)
Pt denies any concerns from 11/13/20 fall. Pt denies any severe pain. Pt reports pain rated a 1 out of 10 in right hand and upper arms when " I use them to much". Pt denies any mobility limitations. Pt aware to call clinic if any symptoms worsens or report to ER in the event of an emergency. Pt verbalizes understanding. Pt stable at discharge.

## 2020-11-15 NOTE — Telephone Encounter (Signed)
I spoke with patient & he stated that chemo was going "okay". Due to his CA number constantly rising that Dr. Tasia Catchings wanted him to get a second opinion & he has been referred to [facility]. He said that he is dealing with some neuropathy thought to be stemming from previous chemo. His said blood sugars just stay high. He did buy an exercise bike & plans to use.  I have scheduled patient 5/20 at 11:30, which is first time that worked for his & your schedule.

## 2020-11-15 NOTE — Telephone Encounter (Signed)
FYI Dominic [name]  Judson Roch,  Would call pt and see how he is doing Would also like to get f/u appt arranged so we can follow DM

## 2020-11-25 ENCOUNTER — Ambulatory Visit: Payer: Managed Care, Other (non HMO) | Admitting: Pharmacist

## 2020-11-25 DIAGNOSIS — G620 Drug-induced polyneuropathy: Secondary | ICD-10-CM

## 2020-11-25 DIAGNOSIS — E119 Type 2 diabetes mellitus without complications: Secondary | ICD-10-CM

## 2020-11-25 DIAGNOSIS — Z794 Long term (current) use of insulin: Secondary | ICD-10-CM

## 2020-11-25 DIAGNOSIS — I10 Essential (primary) hypertension: Secondary | ICD-10-CM

## 2020-11-25 NOTE — Chronic Care Management (AMB) (Addendum)
Care Management   Pharmacy Note  11/25/2020 Name: Dominic [name] MRN: [id number] DOB: May 04, 1971  Subjective: Dominic [name] is a 50 y.o. year old male who is a primary care patient of Burnard Hawthorne, FNP. The Care Management team was consulted for assistance with care management and care coordination needs.    Engaged with patient by telephone for follow up visit in response to provider referral for pharmacy case management and/or care coordination services.   The patient was given information about Care Management services today including:  1. Care Management services includes personalized support from designated clinical staff supervised by the patient's primary care provider, including individualized plan of care and coordination with other care providers. 2. 24/7 contact phone numbers for assistance for urgent and routine care needs. 3. The patient may stop case management services at any time by phone call to the office staff.  Patient agreed to services and consent obtained.  Assessment:  Review of patient status, including review of consultants reports, laboratory and other test data, was performed as part of comprehensive evaluation and provision of chronic care management services.   SDOH (Social Determinants of Health) assessments and interventions performed:  SDOH Interventions   Flowsheet Row Most Recent Value  SDOH Interventions   Financial Strain Interventions Intervention Not Indicated       Objective:  Lab Results  Component Value Date   CREATININE 0.80 11/13/2020   CREATININE 0.82 10/30/2020   CREATININE 0.76 10/16/2020    Lab Results  Component Value Date   HGBA1C 8.3 (H) 06/21/2020       Component Value Date/Time   CHOL 226 (H) 03/07/2020 0902   TRIG 123 03/07/2020 0902   HDL 33 (L) 03/07/2020 0902   CHOLHDL 6.8 (H) 03/07/2020 0902   LDLCALC 170 (H) 03/07/2020 0902    Clinical ASCVD: No  The 10-year ASCVD risk score [name], et al.,  2013) is: 18.3%   Values used to calculate the score:     Age: 61 years     Sex: Male     Is Non-Hispanic African American: No     Diabetic: Yes     Tobacco smoker: No     Systolic Blood Pressure: 0000000 mmHg     Is BP treated: Yes     HDL Cholesterol: 33 mg/dL     Total Cholesterol: 226 mg/dL    BP Readings from Last 3 Encounters:  11/15/20 (!) 152/86  11/13/20 (!) 162/79  11/13/20 (!) 149/81    Care Plan  No Known Allergies  Medications Reviewed Today    Reviewed by Avie Arenas, RPH (Pharmacist) on 11/25/20 at 1142  Med List Status: <None>  Medication Order Taking? Sig Documenting Provider Last Dose Status Informant  0.9 %  sodium chloride infusion [id number]   Doran Stabler, MD  Active   ALPRAZolam Duanne Moron) 0.5 MG tablet [id number] Yes Take half tablet to one tablet by mouth as needed daily for insomnia, anxiety. Burnard Hawthorne, FNP Taking Active   amLODipine (NORVASC) 10 MG tablet [id number] Yes TAKE 1 TABLET BY MOUTH  DAILY End, Harrell Gave, MD Taking Active   aspirin EC 81 MG tablet [id number] Yes Take 1 tablet (81 mg total) by mouth daily. Swallow whole. Earlie Server, MD Taking Active   Continuous Blood Gluc Sensor (FREESTYLE LIBRE 2 [place]) [place] [id number] Yes Scan glucose at least QID Burnard Hawthorne, FNP Taking Active   CONTOUR NEXT TEST test strip [id number]  USE 1 STRIP  TO TEST BLOOD  SUGAR TWICE DAILY Burnard Hawthorne, FNP  Active   gabapentin (NEURONTIN) 100 MG capsule [id number] Yes TAKE 1 CAPSULE(100 MG) BY MOUTH THREE TIMES DAILY  Patient taking differently: Take 100 mg by mouth daily at 6 (six) AM.   Earlie Server, MD Taking Active            Med Note [facility], Iridiana Fonner A   Mon Nov 25, 2020 11:39 AM) Taking TID  hydrochlorothiazide (HYDRODIURIL) 25 MG tablet [id number] Yes Take 25 mg by mouth daily. [provider] Taking Active   insulin glargine (LANTUS SOLOSTAR) 100 UNIT/ML Solostar Pen [id number] Yes Inject 56 Units into the skin daily.  Patient taking  differently: Inject 58 Units into the skin daily.   Burnard Hawthorne, FNP Taking Active   insulin lispro (HUMALOG KWIKPEN) 100 UNIT/ML KwikPen [id number] Yes Inject 18 Units into the skin 3 (three) times daily. Take with meals. Hold if premeal glucose <100 Burnard Hawthorne, FNP Taking Active   Insulin Pen Needle (PEN NEEDLES) 32G X 6 MM MISC [id number]  Used to give insulin injections 4 times daily. Burnard Hawthorne, FNP  Active   lactulose (CHRONULAC) 10 GM/15ML solution [id number] No Take 15 mLs (10 g total) by mouth 2 (two) times daily as needed for moderate constipation or severe constipation.  Patient not taking: No sig reported   Earlie Server, MD Not Taking Active   Lancets [facility] ULTRASOFT) lancets [id number]  Used to check blood sugars twice daily. Burnard Hawthorne, FNP  Active   lidocaine-prilocaine (EMLA) cream [id number] Yes Apply to affected area once Earlie Server, MD Taking Active   loperamide (IMODIUM A-D) 2 MG tablet [id number] No Take 1 tablet (2 mg total) by mouth See admin instructions. Take 2 tabs at diarrhea onset , then 1 tab after every loose bowel movement. Maximum 8 tablets within 24 hour period of time  Patient not taking: Reported on 11/25/2020   Earlie Server, MD Not Taking Active   magic mouthwash w/lidocaine SOLN [id number] Yes Take 5 mLs by mouth 4 (four) times daily as needed for mouth pain. 80 ml viscous lidocaine 2%, 80 ml Mylanta, 80 ml Diphenhydramine 12.5 mg/5 ml Elixir, 80 ml Nystatin 100,000 Unit suspension, 80 ml Prednisolone 15 mg/27ml, 80 ml Distilled Water.  Sig: Swish/Swallow 5-10 ml four times a day as needed. Earlie Server, MD Taking Active   [name] [id number] Yes Take 5 mg by mouth at bedtime as needed. [provider] Taking Active   metFORMIN (GLUCOPHAGE XR) 500 MG 24 hr tablet [id number] Yes Take 4 tablets (2,000 mg total) by mouth every evening. Burnard Hawthorne, FNP Taking Active            Med Note [facility], Nathanal Hermiz A   Mon Nov 25, 2020 11:36 AM) 2  tablets daily   metoprolol succinate (TOPROL-XL) 100 MG 24 hr tablet [id number] Yes TAKE 1 TABLET BY MOUTH  DAILY WITH OR IMMEDIATELY  FOLLOWING A MEAL Leone Haven, MD Taking Active   Omega-3 Fatty Acids (FISH OIL PO) [id number] Yes Take 800 mg by mouth daily. [provider] Taking Active Self  omeprazole (PRILOSEC) 20 MG capsule [id number] No Take 1 capsule (20 mg total) by mouth in the morning and at bedtime.  Patient not taking: Reported on 11/25/2020   Earlie Server, MD Not Taking Active   ondansetron (ZOFRAN ODT) 4 MG disintegrating tablet [id number] No Take 1 tablet (4 mg total) by mouth every 8 (eight) hours as  needed for nausea or vomiting.  Patient not taking: Reported on 11/25/2020   Burnard Hawthorne, FNP Not Taking Active   ondansetron [facility]) 8 MG tablet [id number] No Take 1 tablet (8 mg total) by mouth 2 (two) times daily as needed for refractory nausea / vomiting. Start on day 3 after chemotherapy.  Patient not taking: No sig reported   Earlie Server, MD Not Taking Active   polyethylene glycol powder (GLYCOLAX/MIRALAX) 17 GM/SCOOP powder [id number] No Take by mouth daily. 1/2 capful QD  Patient not taking: No sig reported   [provider] Not Taking Active   potassium chloride (KLOR-CON) 10 MEQ tablet [id number] Yes Take 1 tablet (10 mEq total) by mouth daily. Earlie Server, MD Taking Active   prochlorperazine (COMPAZINE) 10 MG tablet [id number] No Take 1 tablet (10 mg total) by mouth every 6 (six) hours as needed (Nausea or vomiting).  Patient not taking: No sig reported   Earlie Server, MD Not Taking Active   senna (SENOKOT) 8.6 MG TABS tablet [id number] No Take 2 tablets (17.2 mg total) by mouth daily.  Patient not taking: No sig reported   Earlie Server, MD Not Taking Active   tamsulosin (FLOMAX) 0.4 MG CAPS capsule [id number] No Take 1 capsule (0.4 mg total) by mouth daily.  Patient not taking: No sig reported   Stoioff, Ronda Fairly, MD Not Taking Active   telmisartan (MICARDIS) 40  MG tablet [id number] Yes Take 1 tablet (40 mg total) by mouth daily. PLEASE CALL OFFICE TO SCHEDULE FOLLOW UP APPOINTMENT FOR REFILLS. Loel Dubonnet, NP Taking Active   zinc gluconate 50 MG tablet [id number] Yes Take 50 mg by mouth daily. [provider] Taking Active           Patient Active Problem List   Diagnosis Date Noted  . Tachycardia 09/11/2020  . Nausea and vomiting 09/11/2020  . Genetic testing 07/23/2020  . Family history of esophageal cancer   . Family history of prostate cancer   . Insomnia 06/14/2020  . Neuropathy due to chemotherapeutic drug ([place]) 06/11/2020  . Uncontrolled diabetes mellitus ([place]) 06/11/2020  . Encounter for antineoplastic chemotherapy 06/03/2020  . 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 Rectal cancer metastasized to liver ([place]) 05/27/2020  . Goals of care, counseling/discussion 05/19/2020  . Dysuria 05/07/2020  . Acute prostatitis 05/07/2020  . Liver disease 05/02/2020  . Bloating 03/13/2020  . GERD (gastroesophageal reflux disease)   . Hypertriglyceridemia   . Hepatic steatosis 08/14/2019  . Unstable angina ([place]) 08/11/2019  . H/O supraventricular tachycardia 08/11/2019  . Morbid obesity ([place]) 08/11/2019  . COVID-19 07/18/2019  . Type 2 diabetes mellitus without complication, with long-term current use of insulin ([place]) 02/27/2019  . SVT (supraventricular tachycardia) ([place]) 02/27/2019  . Uncontrolled hypertension 02/27/2019  . Psoriatic arthritis ([place]) 02/27/2019    Conditions to be addressed/monitored: HTN, DMII and cancer  Care Plan : Medication Management  Updates made by Jacilyn Sanpedro A, RPH since 11/25/2020 12:00 AM    Problem: Diabetes, Cancer, HTN w/ SVT     Long-Range Goal: Disease Progression Prevention   This Visit's Progress: On track  Recent Progress: On track  Priority: High  Note:   Current Barriers:  . Unable to independently afford treatment regimen . Unable to achieve control of diabetes   . Complex patient with recent cancer diagnosis  Pharmacist Clinical Goal(s):  Marland Kitchen Over the next 90 days, patient will verbalize ability to afford treatment regimen. . Over the next 90 days,  patient will adhere to prescribed medication regimen  Interventions: . 1:1 collaboration with Allegra Grana, FNP regarding development and update of comprehensive plan of care as evidenced by provider attestation and co-signature . Inter-disciplinary care team collaboration (see longitudinal plan of care) . Comprehensive medication review performed; medication list updated in electronic medical record  Diabetes: . Uncontrolled; current treatment: metformin XR 500 mg QAM, 1500 mg QPM, Lantus 56 units daily, Humalog 18 units TID w/meals.  o Pt reduced metformin XR to 500 mg BID to reduce neuropathy given metformin has risk for vitamin B12 deficiency o Reports taking Lantus 58 units daily instead of 56 units as prescribed o Denies hypoglycemic episodes . Current glucose readings: CGM Libre o Date of Download: Nov 12, 2020 - Nov 25, 2020 o % Time CGM is active: 89% o Average Glucose: 251 mg/dL o Glucose Management Indicator: 9.3% o Glucose Variability: 29.7%(goal <36%) o Time in Goal:  o - Time in range 70-180: 21% o - Time above range: 79% o - Time below range: 0% o Observed patterns: High after meals and throughout the day most likely secondary to recent administration of chemotherapy supportive medications (IV steroids and IV dextrose) given every 14 days. . Diet - Eating 2-3 meals/day. Occasional nausea. Indulges in sweets occassionally (chocolate covered almonds), carb-sensitive; eats vegetables first, then protein, then carbs to minimize after meal BG spike . Given elevated BG post prandially, recommend increasing bolus insulin to 20 units with meals. I do not believe patient needs to hold bolus insulin if BG <100 given consistent regular elevations with meals and carb content with meals.   . Recommended to continue current regimen at this time  Hypertension w/ SVT . Appropriately controlled given comorbidities; current treatment: amlodipine 10 mg, HCTZ 25 mg daily, metoprolol succinate 100 mg daily, telmisartan 40 mg daily   o Ran out of Telmisartan a couple days ago. Advised patient to contact cardiology as additional refills require cardiology approval. Advised patient to call our office if experiencing any further issues/concerns. Patient verbalized understanding.   . Encouraged to continue current therapy at this time and collaboration with cardiology  Malignancy: . Followed by Dr. Cathie Hoops. Symptomatic management includes: alprazolam 0.25-0.5 mg PRN anxiety; gabapentin 100 mg TID, lactulose 10 g BID PRN severe constipation, ondansetron ODT PRN nausea, potassium 10 mEq daily, prochlorperazine 10 mg PRN nausea, senna 8.6 mg PRN moderate constipation . Receives IV steroids and IV dextrose with each chemotherapy cycle . Reports new chemotherapy regimen for pancreatic cancer management . Continue current collaboration with oncology at this time  BPH: . Controlled per patient report; current regimen: tamsulosin 0.4 mg daily (not taking); follows w/ Dr. Richardo Hanks . Continue current regimen along with urology collaboration at this time  Patient Goals/Self-Care Activities . Over the next 90 days, patient will:  - take medications as prescribed check glucose at least three times daily using CGM, document, and provide at future appointments Collaborate with interdisciplinary team   Follow Up Plan: Telephone follow up appointment with care management team member scheduled for: ~6 weeks      Medication Assistance:  None required.  Patient affirms current coverage meets needs.  Follow Up:  Patient agrees to Care Plan and Follow-up.  Plan: Telephone follow up appointment with care management team member scheduled for:  ~6 weeks  Fabio Neighbors, PharmD, BCPS PGY2 Ambulatory Care  Resident [facility]  Pharmacy   I was present for this visit and agree with the documentation by the resident as above.  Catie Darnelle Maffucci, PharmD, [place], CPP Clinical Pharmacist [place] at [facility] [phone]   Encounter details: CCM Time Spent      Value Time User   Total time (minutes)  0 11/25/2020 12:01 PM Ellisha Bankson A, RPH     Moderate to High Complex Decision Making    None     CCM Services: This encounter meets routine CCM services.  Prior to outreach and patient consent for Chronic Care Management, I referred this patient for services after reviewing the nominated patient list or from a personal encounter with the patient.  I have personally reviewed this encounter including the documentation in this note and have collaborated with the care management provider regarding care management and care coordination activities to include development and update of the comprehensive care plan. I am certifying that I agree with the content of this note and encounter as supervising physician. Agree with increase from 18 to 20 units of humalog TID.  Mable Paris, NP

## 2020-11-25 NOTE — Patient Instructions (Signed)
  Visit Information  Goals Addressed              This Visit's Progress   .  Medication Monitoring (pt-stated)        Patient Goals/Self-Care Activities . Over the next 90 days, patient will:  - take medications as prescribed -check glucose at least three times daily using CGM, document, and provide at future appointments -Collaborate with interdisciplinary team.        Patient verbalizes understanding of instructions provided today and agrees to view in [place].   Telephone follow up appointment with care management team member scheduled for: ~ 6 weeks  Lorel Monaco, PharmD, [place]

## 2020-11-27 ENCOUNTER — Encounter: Payer: Self-pay | Admitting: Oncology

## 2020-11-27 ENCOUNTER — Telehealth: Payer: Self-pay

## 2020-11-27 ENCOUNTER — Inpatient Hospital Stay: Payer: Managed Care, Other (non HMO)

## 2020-11-27 ENCOUNTER — Other Ambulatory Visit: Payer: Self-pay | Admitting: Family

## 2020-11-27 ENCOUNTER — Inpatient Hospital Stay: Payer: Managed Care, Other (non HMO) | Attending: Oncology

## 2020-11-27 ENCOUNTER — Inpatient Hospital Stay (HOSPITAL_BASED_OUTPATIENT_CLINIC_OR_DEPARTMENT_OTHER): Payer: Managed Care, Other (non HMO) | Admitting: Oncology

## 2020-11-27 ENCOUNTER — Other Ambulatory Visit: Payer: Self-pay

## 2020-11-27 VITALS — BP 134/89 | HR 84 | Temp 98.2°F | Resp 18 | Wt 249.6 lb

## 2020-11-27 DIAGNOSIS — Z79899 Other long term (current) drug therapy: Secondary | ICD-10-CM | POA: Insufficient documentation

## 2020-11-27 DIAGNOSIS — C20 Malignant neoplasm of rectum: Secondary | ICD-10-CM

## 2020-11-27 DIAGNOSIS — C787 Secondary malignant neoplasm of liver and intrahepatic bile duct: Secondary | ICD-10-CM

## 2020-11-27 DIAGNOSIS — T451X5A Adverse effect of antineoplastic and immunosuppressive drugs, initial encounter: Secondary | ICD-10-CM | POA: Insufficient documentation

## 2020-11-27 DIAGNOSIS — E119 Type 2 diabetes mellitus without complications: Secondary | ICD-10-CM | POA: Insufficient documentation

## 2020-11-27 DIAGNOSIS — G620 Drug-induced polyneuropathy: Secondary | ICD-10-CM | POA: Diagnosis not present

## 2020-11-27 DIAGNOSIS — K219 Gastro-esophageal reflux disease without esophagitis: Secondary | ICD-10-CM | POA: Insufficient documentation

## 2020-11-27 DIAGNOSIS — Z5111 Encounter for antineoplastic chemotherapy: Secondary | ICD-10-CM

## 2020-11-27 DIAGNOSIS — I10 Essential (primary) hypertension: Secondary | ICD-10-CM | POA: Insufficient documentation

## 2020-11-27 DIAGNOSIS — C7A8 Other malignant neuroendocrine tumors: Secondary | ICD-10-CM | POA: Diagnosis not present

## 2020-11-27 DIAGNOSIS — E1165 Type 2 diabetes mellitus with hyperglycemia: Secondary | ICD-10-CM | POA: Insufficient documentation

## 2020-11-27 DIAGNOSIS — E1365 Other specified diabetes mellitus with hyperglycemia: Secondary | ICD-10-CM

## 2020-11-27 DIAGNOSIS — Z5112 Encounter for antineoplastic immunotherapy: Secondary | ICD-10-CM | POA: Diagnosis not present

## 2020-11-27 DIAGNOSIS — Z7982 Long term (current) use of aspirin: Secondary | ICD-10-CM | POA: Insufficient documentation

## 2020-11-27 DIAGNOSIS — C7951 Secondary malignant neoplasm of bone: Secondary | ICD-10-CM

## 2020-11-27 DIAGNOSIS — Z794 Long term (current) use of insulin: Secondary | ICD-10-CM | POA: Insufficient documentation

## 2020-11-27 DIAGNOSIS — I471 Supraventricular tachycardia: Secondary | ICD-10-CM | POA: Insufficient documentation

## 2020-11-27 LAB — COMPREHENSIVE METABOLIC PANEL
ALT: 19 U/L (ref 0–44)
AST: 26 U/L (ref 15–41)
Albumin: 3.7 g/dL (ref 3.5–5.0)
Alkaline Phosphatase: 91 U/L (ref 38–126)
Anion gap: 9 (ref 5–15)
BUN: 19 mg/dL (ref 6–20)
CO2: 26 mmol/L (ref 22–32)
Calcium: 8.8 mg/dL — ABNORMAL LOW (ref 8.9–10.3)
Chloride: 99 mmol/L (ref 98–111)
Creatinine, Ser: 0.89 mg/dL (ref 0.61–1.24)
GFR, Estimated: >60 mL/min (ref >60)
Glucose, Bld: 175 mg/dL — ABNORMAL HIGH (ref 70–99)
Potassium: 3.8 mmol/L (ref 3.5–5.1)
Sodium: 134 mmol/L — ABNORMAL LOW (ref 135–145)
Total Bilirubin: 0.9 mg/dL (ref 0.3–1.2)
Total Protein: 7.3 g/dL (ref 6.5–8.1)

## 2020-11-27 LAB — CBC WITH DIFFERENTIAL/PLATELET
Abs Immature Granulocytes: 0.02 10*3/uL (ref 0.00–0.07)
Basophils Absolute: 0 10*3/uL (ref 0.0–0.1)
Basophils Relative: 1 %
Eosinophils Absolute: 0.2 10*3/uL (ref 0.0–0.5)
Eosinophils Relative: 3 %
HCT: 40.2 % (ref 39.0–52.0)
Hemoglobin: 13.8 g/dL (ref 13.0–17.0)
Immature Granulocytes: 0 %
Lymphocytes Relative: 30 %
Lymphs Abs: 2.1 10*3/uL (ref 0.7–4.0)
MCH: 29.8 pg (ref 26.0–34.0)
MCHC: 34.3 g/dL (ref 30.0–36.0)
MCV: 86.8 fL (ref 80.0–100.0)
Monocytes Absolute: 0.7 10*3/uL (ref 0.1–1.0)
Monocytes Relative: 10 %
Neutro Abs: 3.9 10*3/uL (ref 1.7–7.7)
Neutrophils Relative %: 56 %
Platelets: 237 10*3/uL (ref 150–400)
RBC: 4.63 MIL/uL (ref 4.22–5.81)
RDW: 14.3 % (ref 11.5–15.5)
WBC: 6.9 10*3/uL (ref 4.0–10.5)
nRBC: 0 % (ref 0.0–0.2)

## 2020-11-27 MED ORDER — SODIUM CHLORIDE 0.9 % IV SOLN
900.0000 mg | Freq: Once | INTRAVENOUS | Status: AC
  Administered 2020-11-27: 900 mg via INTRAVENOUS
  Filled 2020-11-27: qty 45

## 2020-11-27 MED ORDER — SODIUM CHLORIDE 0.9 % IV SOLN
2400.0000 mg/m2 | INTRAVENOUS | Status: DC
  Administered 2020-11-27: 5500 mg via INTRAVENOUS
  Filled 2020-11-27: qty 110

## 2020-11-27 MED ORDER — SODIUM CHLORIDE 0.9 % IV SOLN
150.0000 mg/m2 | Freq: Once | INTRAVENOUS | Status: AC
  Administered 2020-11-27: 340 mg via INTRAVENOUS
  Filled 2020-11-27: qty 2

## 2020-11-27 MED ORDER — SODIUM CHLORIDE 0.9 % IV SOLN
10.0000 mg | Freq: Once | INTRAVENOUS | Status: AC
  Administered 2020-11-27: 10 mg via INTRAVENOUS
  Filled 2020-11-27: qty 10

## 2020-11-27 MED ORDER — ATROPINE SULFATE 1 MG/ML IJ SOLN
0.5000 mg | Freq: Once | INTRAMUSCULAR | Status: AC
  Administered 2020-11-27: 0.5 mg via INTRAVENOUS
  Filled 2020-11-27: qty 1

## 2020-11-27 MED ORDER — PALONOSETRON HCL INJECTION 0.25 MG/5ML
0.2500 mg | Freq: Once | INTRAVENOUS | Status: AC
Start: 2020-11-27 — End: 2020-11-27
  Administered 2020-11-27: 0.25 mg via INTRAVENOUS
  Filled 2020-11-27: qty 5

## 2020-11-27 MED ORDER — GABAPENTIN 100 MG PO CAPS: 200.0000 mg | ORAL_CAPSULE | Freq: Three times a day (TID) | ORAL | 0 refills | Status: DC

## 2020-11-27 MED ORDER — SODIUM CHLORIDE 0.9 % IV SOLN
600.0000 mg | Freq: Once | INTRAVENOUS | Status: AC
  Administered 2020-11-27: 600 mg via INTRAVENOUS
  Filled 2020-11-27: qty 16

## 2020-11-27 MED ORDER — SODIUM CHLORIDE 0.9 % IV SOLN
Freq: Once | INTRAVENOUS | Status: AC
Start: 2020-11-27 — End: 2020-11-27
  Filled 2020-11-27: qty 250

## 2020-11-27 MED ORDER — FLUOROURACIL CHEMO INJECTION 2.5 GM/50ML
400.0000 mg/m2 | Freq: Once | INTRAVENOUS | Status: AC
Start: 2020-11-27 — End: 2020-11-27
  Administered 2020-11-27: 900 mg via INTRAVENOUS
  Filled 2020-11-27: qty 18

## 2020-11-27 NOTE — Progress Notes (Signed)
Patient reports slight improvement with neuropathy.  Would like for Dr. Tasia Catchings to look at places on his lower right leg.

## 2020-11-27 NOTE — Progress Notes (Signed)
Pt tolerated all infusions well today and left the chemo suite stable and ambulatory with his pump infusing as ordered.

## 2020-11-27 NOTE — Telephone Encounter (Signed)
Records faxed for 2nd opinion request to Dr. Erich Montane at [facility].  P: [phone] F: [phone]

## 2020-11-27 NOTE — Patient Instructions (Signed)
[facility]  Discharge Instructions: Thank you for choosing [facility] to provide your oncology and hematology care.  If you have a lab appointment with the [facility], please go directly to the [facility] and check in at the registration area.  Wear comfortable clothing and clothing appropriate for easy access to any Portacath or PICC line.   We strive to give you quality time with your provider. You may need to reschedule your appointment if you arrive late (15 or more minutes).  Arriving late affects you and other patients whose appointments are after yours.  Also, if you miss three or more appointments without notifying the office, you may be dismissed from the clinic at the provider's discretion.      For prescription refill requests, have your pharmacy contact our office and allow 72 hours for refills to be completed.    Today you received the following chemotherapy and/or immunotherapy agents leucovorin, irrinotecan, 5FU, avastin      To help prevent nausea and vomiting after your treatment, we encourage you to take your nausea medication as directed.  BELOW ARE SYMPTOMS THAT SHOULD BE REPORTED IMMEDIATELY: . *FEVER GREATER THAN 100.4 F (38 C) OR HIGHER . *CHILLS OR SWEATING . *NAUSEA AND VOMITING THAT IS NOT CONTROLLED WITH YOUR NAUSEA MEDICATION . *UNUSUAL SHORTNESS OF BREATH . *UNUSUAL BRUISING OR BLEEDING . *URINARY PROBLEMS (pain or burning when urinating, or frequent urination) . *BOWEL PROBLEMS (unusual diarrhea, constipation, pain near the anus) . TENDERNESS IN MOUTH AND THROAT WITH OR WITHOUT PRESENCE OF ULCERS (sore throat, sores in mouth, or a toothache) . UNUSUAL RASH, SWELLING OR PAIN  . UNUSUAL VAGINAL DISCHARGE OR ITCHING   Items with * indicate a potential emergency and should be followed up as soon as possible or go to the Emergency Department if any problems should occur.  Please show the CHEMOTHERAPY  ALERT CARD or IMMUNOTHERAPY ALERT CARD at check-in to the Emergency Department and triage nurse.  Should you have questions after your visit or need to cancel or reschedule your appointment, please contact [facility]  [phone] and follow the prompts.  Office hours are 8:00 a.m. to 4:30 p.m. Monday - Friday. Please note that voicemails left after 4:00 p.m. may not be returned until the following business day.  We are closed weekends and major holidays. You have access to a nurse at all times for urgent questions. Please call the main number to the clinic [phone] and follow the prompts.  For any non-urgent questions, you may also contact your provider using MyChart. We now offer e-Visits for anyone 64 and older to request care online for non-urgent symptoms. For details visit [email or web address].   Also download the MyChart app! Go to the app store, search "MyChart", open the app, select [facility], and log in with your MyChart username and password.  Due to Covid, a mask is required upon entering the hospital/clinic. If you do not have a mask, one will be given to you upon arrival. For doctor visits, patients may have 1 support person aged 51 or older with them. For treatment visits, patients cannot have anyone with them due to current Covid guidelines and our immunocompromised population.   Bevacizumab injection What is this medicine? BEVACIZUMAB (be va SIZ yoo mab) is a monoclonal antibody. It is used to treat many types of cancer. This medicine may be used for other purposes; ask your health care provider or pharmacist if you  have questions. COMMON BRAND NAME(S): Avastin, MVASI, Zirabev What should I tell my health care provider before I take this medicine? They need to know if you have any of these conditions:  diabetes  heart disease  high blood pressure  history of coughing up blood  prior anthracycline chemotherapy (e.g., doxorubicin,  daunorubicin, epirubicin)  recent or ongoing radiation therapy  recent or planning to have surgery  stroke  an unusual or allergic reaction to bevacizumab, hamster proteins, mouse proteins, other medicines, foods, dyes, or preservatives  pregnant or trying to get pregnant  breast-feeding How should I use this medicine? This medicine is for infusion into a vein. It is given by a health care professional in a hospital or clinic setting. Talk to your pediatrician regarding the use of this medicine in children. Special care may be needed. Overdosage: If you think you have taken too much of this medicine contact a poison control center or emergency room at once. NOTE: This medicine is only for you. Do not share this medicine with others. What if I miss a dose? It is important not to miss your dose. Call your doctor or health care professional if you are unable to keep an appointment. What may interact with this medicine? Interactions are not expected. This list may not describe all possible interactions. Give your health care provider a list of all the medicines, herbs, non-prescription drugs, or dietary supplements you use. Also tell them if you smoke, drink alcohol, or use illegal drugs. Some items may interact with your medicine. What should I watch for while using this medicine? Your condition will be monitored carefully while you are receiving this medicine. You will need important blood work and urine testing done while you are taking this medicine. This medicine may increase your risk to bruise or bleed. Call your doctor or health care professional if you notice any unusual bleeding. Before having surgery, talk to your health care provider to make sure it is ok. This drug can increase the risk of poor healing of your surgical site or wound. You will need to stop this drug for 28 days before surgery. After surgery, wait at least 28 days before restarting this drug. Make sure the surgical  site or wound is healed enough before restarting this drug. Talk to your health care provider if questions. Do not become pregnant while taking this medicine or for 6 months after stopping it. Women should inform their doctor if they wish to become pregnant or think they might be pregnant. There is a potential for serious side effects to an unborn child. Talk to your health care professional or pharmacist for more information. Do not breast-feed an infant while taking this medicine and for 6 months after the last dose. This medicine has caused ovarian failure in some women. This medicine may interfere with the ability to have a child. You should talk to your doctor or health care professional if you are concerned about your fertility. What side effects may I notice from receiving this medicine? Side effects that you should report to your doctor or health care professional as soon as possible:  allergic reactions like skin rash, itching or hives, swelling of the face, lips, or tongue  chest pain or chest tightness  chills  coughing up blood  high fever  seizures  severe constipation  signs and symptoms of bleeding such as bloody or black, tarry stools; red or dark-brown urine; spitting up blood or brown material that looks like coffee grounds;  red spots on the skin; unusual bruising or bleeding from the eye, gums, or nose  signs and symptoms of a blood clot such as breathing problems; chest pain; severe, sudden headache; pain, swelling, warmth in the leg  signs and symptoms of a stroke like changes in vision; confusion; trouble speaking or understanding; severe headaches; sudden numbness or weakness of the face, arm or leg; trouble walking; dizziness; loss of balance or coordination  stomach pain  sweating  swelling of legs or ankles  vomiting  weight gain Side effects that usually do not require medical attention (report to your doctor or health care professional if they continue  or are bothersome):  back pain  changes in taste  decreased appetite  dry skin  nausea  tiredness This list may not describe all possible side effects. Call your doctor for medical advice about side effects. You may report side effects to FDA at 1-800-FDA-1088. Where should I keep my medicine? This drug is given in a hospital or clinic and will not be stored at home. NOTE: This sheet is a summary. It may not cover all possible information. If you have questions about this medicine, talk to your doctor, pharmacist, or health care provider.  2021 Elsevier/Gold Standard (2019-04-26 10:50:46)  Fluorouracil, 5-FU injection What is this medicine? FLUOROURACIL, 5-FU (flure oh YOOR a sil) is a chemotherapy drug. It slows the growth of cancer cells. This medicine is used to treat many types of cancer like breast cancer, colon or rectal cancer, pancreatic cancer, and stomach cancer. This medicine may be used for other purposes; ask your health care provider or pharmacist if you have questions. COMMON BRAND NAME(S): Adrucil What should I tell my health care provider before I take this medicine? They need to know if you have any of these conditions:  blood disorders  dihydropyrimidine dehydrogenase (DPD) deficiency  infection (especially a virus infection such as chickenpox, cold sores, or herpes)  kidney disease  liver disease  malnourished, poor nutrition  recent or ongoing radiation therapy  an unusual or allergic reaction to fluorouracil, other chemotherapy, other medicines, foods, dyes, or preservatives  pregnant or trying to get pregnant  breast-feeding How should I use this medicine? This drug is given as an infusion or injection into a vein. It is administered in a hospital or clinic by a specially trained health care professional. Talk to your pediatrician regarding the use of this medicine in children. Special care may be needed. Overdosage: If you think you have taken  too much of this medicine contact a poison control center or emergency room at once. NOTE: This medicine is only for you. Do not share this medicine with others. What if I miss a dose? It is important not to miss your dose. Call your doctor or health care professional if you are unable to keep an appointment. What may interact with this medicine? Do not take this medicine with any of the following medications:  live virus vaccines This medicine may also interact with the following medications:  medicines that treat or prevent blood clots like warfarin, enoxaparin, and dalteparin This list may not describe all possible interactions. Give your health care provider a list of all the medicines, herbs, non-prescription drugs, or dietary supplements you use. Also tell them if you smoke, drink alcohol, or use illegal drugs. Some items may interact with your medicine. What should I watch for while using this medicine? Visit your doctor for checks on your progress. This drug may make you feel  generally unwell. This is not uncommon, as chemotherapy can affect healthy cells as well as cancer cells. Report any side effects. Continue your course of treatment even though you feel ill unless your doctor tells you to stop. In some cases, you may be given additional medicines to help with side effects. Follow all directions for their use. Call your doctor or health care professional for advice if you get a fever, chills or sore throat, or other symptoms of a cold or flu. Do not treat yourself. This drug decreases your body's ability to fight infections. Try to avoid being around people who are sick. This medicine may increase your risk to bruise or bleed. Call your doctor or health care professional if you notice any unusual bleeding. Be careful brushing and flossing your teeth or using a toothpick because you may get an infection or bleed more easily. If you have any dental work done, tell your dentist you are  receiving this medicine. Avoid taking products that contain aspirin, acetaminophen, ibuprofen, naproxen, or ketoprofen unless instructed by your doctor. These medicines may hide a fever. Do not become pregnant while taking this medicine. Women should inform their doctor if they wish to become pregnant or think they might be pregnant. There is a potential for serious side effects to an unborn child. Talk to your health care professional or pharmacist for more information. Do not breast-feed an infant while taking this medicine. Men should inform their doctor if they wish to father a child. This medicine may lower sperm counts. Do not treat diarrhea with over the counter products. Contact your doctor if you have diarrhea that lasts more than 2 days or if it is severe and watery. This medicine can make you more sensitive to the sun. Keep out of the sun. If you cannot avoid being in the sun, wear protective clothing and use sunscreen. Do not use sun lamps or tanning beds/booths. What side effects may I notice from receiving this medicine? Side effects that you should report to your doctor or health care professional as soon as possible:  allergic reactions like skin rash, itching or hives, swelling of the face, lips, or tongue  low blood counts - this medicine may decrease the number of white blood cells, red blood cells and platelets. You may be at increased risk for infections and bleeding.  signs of infection - fever or chills, cough, sore throat, pain or difficulty passing urine  signs of decreased platelets or bleeding - bruising, pinpoint red spots on the skin, black, tarry stools, blood in the urine  signs of decreased red blood cells - unusually weak or tired, fainting spells, lightheadedness  breathing problems  changes in vision  chest pain  mouth sores  nausea and vomiting  pain, swelling, redness at site where injected  pain, tingling, numbness in the hands or feet  redness,  swelling, or sores on hands or feet  stomach pain  unusual bleeding Side effects that usually do not require medical attention (report to your doctor or health care professional if they continue or are bothersome):  changes in finger or toe nails  diarrhea  dry or itchy skin  hair loss  headache  loss of appetite  sensitivity of eyes to the light  stomach upset  unusually teary eyes This list may not describe all possible side effects. Call your doctor for medical advice about side effects. You may report side effects to FDA at 1-800-FDA-1088. Where should I keep my medicine? This drug  is given in a hospital or clinic and will not be stored at home. NOTE: This sheet is a summary. It may not cover all possible information. If you have questions about this medicine, talk to your doctor, pharmacist, or health care provider.  2021 Elsevier/Gold Standard (2019-05-30 15:00:03)  Leucovorin injection What is this medicine? LEUCOVORIN (loo koe VOR in) is used to prevent or treat the harmful effects of some medicines. This medicine is used to treat anemia caused by a low amount of folic acid in the body. It is also used with 5-fluorouracil (5-FU) to treat colon cancer. This medicine may be used for other purposes; ask your health care provider or pharmacist if you have questions. What should I tell my health care provider before I take this medicine? They need to know if you have any of these conditions:  anemia from low levels of vitamin B-12 in the blood  an unusual or allergic reaction to leucovorin, folic acid, other medicines, foods, dyes, or preservatives  pregnant or trying to get pregnant  breast-feeding How should I use this medicine? This medicine is for injection into a muscle or into a vein. It is given by a health care professional in a hospital or clinic setting. Talk to your pediatrician regarding the use of this medicine in children. Special care may be  needed. Overdosage: If you think you have taken too much of this medicine contact a poison control center or emergency room at once. NOTE: This medicine is only for you. Do not share this medicine with others. What if I miss a dose? This does not apply. What may interact with this medicine?  capecitabine  fluorouracil  phenobarbital  phenytoin  primidone  trimethoprim-sulfamethoxazole This list may not describe all possible interactions. Give your health care provider a list of all the medicines, herbs, non-prescription drugs, or dietary supplements you use. Also tell them if you smoke, drink alcohol, or use illegal drugs. Some items may interact with your medicine. What should I watch for while using this medicine? Your condition will be monitored carefully while you are receiving this medicine. This medicine may increase the side effects of 5-fluorouracil, 5-FU. Tell your doctor or health care professional if you have diarrhea or mouth sores that do not get better or that get worse. What side effects may I notice from receiving this medicine? Side effects that you should report to your doctor or health care professional as soon as possible:  allergic reactions like skin rash, itching or hives, swelling of the face, lips, or tongue  breathing problems  fever, infection  mouth sores  unusual bleeding or bruising  unusually weak or tired Side effects that usually do not require medical attention (report to your doctor or health care professional if they continue or are bothersome):  constipation or diarrhea  loss of appetite  nausea, vomiting This list may not describe all possible side effects. Call your doctor for medical advice about side effects. You may report side effects to FDA at 1-800-FDA-1088. Where should I keep my medicine? This drug is given in a hospital or clinic and will not be stored at home. NOTE: This sheet is a summary. It may not cover all possible  information. If you have questions about this medicine, talk to your doctor, pharmacist, or health care provider.  2021 Elsevier/Gold Standard (2008-01-03 16:50:29)  Irinotecan injection What is this medicine? IRINOTECAN (ir in oh TEE kan ) is a chemotherapy drug. It is used to treat colon  and rectal cancer. This medicine may be used for other purposes; ask your health care provider or pharmacist if you have questions. COMMON BRAND NAME(S): Camptosar What should I tell my health care provider before I take this medicine? They need to know if you have any of these conditions:  dehydration  diarrhea  infection (especially a virus infection such as chickenpox, cold sores, or herpes)  liver disease  low blood counts, like low white cell, platelet, or red cell counts  low levels of calcium, magnesium, or potassium in the blood  recent or ongoing radiation therapy  an unusual or allergic reaction to irinotecan, other medicines, foods, dyes, or preservatives  pregnant or trying to get pregnant  breast-feeding How should I use this medicine? This drug is given as an infusion into a vein. It is administered in a hospital or clinic by a specially trained health care professional. Talk to your pediatrician regarding the use of this medicine in children. Special care may be needed. Overdosage: If you think you have taken too much of this medicine contact a poison control center or emergency room at once. NOTE: This medicine is only for you. Do not share this medicine with others. What if I miss a dose? It is important not to miss your dose. Call your doctor or health care professional if you are unable to keep an appointment. What may interact with this medicine? Do not take this medicine with any of the following medications:  cobicistat  itraconazole This medicine may interact with the following medications:  antiviral medicines for HIV or AIDS  certain antibiotics like rifampin  or rifabutin  certain medicines for fungal infections like ketoconazole, posaconazole, and voriconazole  certain medicines for seizures like carbamazepine, phenobarbital, phenotoin  clarithromycin  gemfibrozil  nefazodone  St. John's Wort This list may not describe all possible interactions. Give your health care provider a list of all the medicines, herbs, non-prescription drugs, or dietary supplements you use. Also tell them if you smoke, drink alcohol, or use illegal drugs. Some items may interact with your medicine. What should I watch for while using this medicine? Your condition will be monitored carefully while you are receiving this medicine. You will need important blood work done while you are taking this medicine. This drug may make you feel generally unwell. This is not uncommon, as chemotherapy can affect healthy cells as well as cancer cells. Report any side effects. Continue your course of treatment even though you feel ill unless your doctor tells you to stop. In some cases, you may be given additional medicines to help with side effects. Follow all directions for their use. You may get drowsy or dizzy. Do not drive, use machinery, or do anything that needs mental alertness until you know how this medicine affects you. Do not stand or sit up quickly, especially if you are an older patient. This reduces the risk of dizzy or fainting spells. Call your health care professional for advice if you get a fever, chills, or sore throat, or other symptoms of a cold or flu. Do not treat yourself. This medicine decreases your body's ability to fight infections. Try to avoid being around people who are sick. Avoid taking products that contain aspirin, acetaminophen, ibuprofen, naproxen, or ketoprofen unless instructed by your doctor. These medicines may hide a fever. This medicine may increase your risk to bruise or bleed. Call your doctor or health care professional if you notice any unusual  bleeding. Be careful brushing and flossing  your teeth or using a toothpick because you may get an infection or bleed more easily. If you have any dental work done, tell your dentist you are receiving this medicine. Do not become pregnant while taking this medicine or for 6 months after stopping it. Women should inform their health care professional if they wish to become pregnant or think they might be pregnant. Men should not father a child while taking this medicine and for 3 months after stopping it. There is potential for serious side effects to an unborn child. Talk to your health care professional for more information. Do not breast-feed an infant while taking this medicine or for 7 days after stopping it. This medicine has caused ovarian failure in some women. This medicine may make it more difficult to get pregnant. Talk to your health care professional if you are concerned about your fertility. This medicine has caused decreased sperm counts in some men. This may make it more difficult to father a child. Talk to your health care professional if you are concerned about your fertility. What side effects may I notice from receiving this medicine? Side effects that you should report to your doctor or health care professional as soon as possible:  allergic reactions like skin rash, itching or hives, swelling of the face, lips, or tongue  chest pain  diarrhea  flushing, runny nose, sweating during infusion  low blood counts - this medicine may decrease the number of white blood cells, red blood cells and platelets. You may be at increased risk for infections and bleeding.  nausea, vomiting  pain, swelling, warmth in the leg  signs of decreased platelets or bleeding - bruising, pinpoint red spots on the skin, black, tarry stools, blood in the urine  signs of infection - fever or chills, cough, sore throat, pain or difficulty passing urine  signs of decreased red blood cells - unusually  weak or tired, fainting spells, lightheadedness Side effects that usually do not require medical attention (report to your doctor or health care professional if they continue or are bothersome):  constipation  hair loss  headache  loss of appetite  mouth sores  stomach pain This list may not describe all possible side effects. Call your doctor for medical advice about side effects. You may report side effects to FDA at 1-800-FDA-1088. Where should I keep my medicine? This drug is given in a hospital or clinic and will not be stored at home. NOTE: This sheet is a summary. It may not cover all possible information. If you have questions about this medicine, talk to your doctor, pharmacist, or health care provider.  2021 Elsevier/Gold Standard (2019-05-30 17:46:13)

## 2020-11-27 NOTE — Progress Notes (Signed)
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  #reports symptoms of upper abdomen band like discomfort and bloating for a few months, improved symptoms with omeprazole,.Chronic constipation, he has blood in the stool occationally Seen by PCP in September 2021. AlK phosphatase was recently noted to be elevated as well well elevated GGT. He was referred to see gastroenteralgias on 04/10/2020.  04/25/2020 EGD showed gastric fundus mild inflammation. Biopsy showed reactive gastropathy.  04/30/2020 [place] RUQ showed cirrhosis with multiple hepatic masses.  05/01/2020 AFP 1.7 05/02/2020 MR liver w wo contrast showed numerous heterozygous enhancing liver lesions, throughout the liver, not typical for [facility], likely metastatic disease.  Solid appearing enhancing lesion in pancreatic tail 1.9x1.5cm, suspicious for primary neoplasm.  Mild adenopathy within porta hepatic region and porta hepatic region. Splenomegaly.   Patient has history of SVT, psoriatic arthritis, morbid obesity, GERD, DM.  Family history + maternal uncle with esophageal cancer.   + unintentional weight loss, no fever, chills. "sweats a lot". Currently on antibiotics for proctitis.  He works at [employer], lives in [place]. He lives with his partner.     # 05/17/2020, colonoscopy showed a fungating and infiltrative partially obstructing large mass in the proximal rectum and in the mid rectum.  The mass was partially circumferential, involving two thirds of the lumen circumference is.  Biopsy showed adenocarcinoma.  05/23/2020, patient underwent EUS biopsy of the pancreatic tail lesion.  Biopsy pathology showed well-differentiated neuroendocrine tumor.  Ki-67 is noncontributory due to insufficient tissue in the cell block.  # Genetic testing is negative.  #06/03/2020  Start FOLFOX and bevacizumab. #09/18/2020 Infusion reaction to oxaliplatin during cycle 8 FOLFOX bevacizumab. # 10/02/2020 FOLFIRI +Bevacitzumab Patient has had intermittent SVT symptoms.  He is overdue for his appointment with cardiology.  INTERVAL HISTORY Dominic [name] is a 50 y.o. 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 Problems and complaints are listed below: Patient tolerates FOLFIRI with bevacizumab. Denies any nausea vomiting. Great 1-2 diarrhea for a few days after therapy.  Improved with Imodium. Continues to have neuropathy.  Symptoms are stable.  Review of Systems  Constitutional: Positive for fatigue. Negative for appetite change, chills, fever and unexpected weight change.  HENT:   Negative for hearing loss and voice change.   Eyes: Negative for eye problems and icterus.  Respiratory: Negative for chest tightness, cough and shortness of breath.   Cardiovascular: Negative for chest pain, leg swelling and palpitations.  Gastrointestinal: Negative for abdominal distention and nausea.       Burping  Endocrine: Negative for hot flashes.  Genitourinary: Negative for difficulty urinating, dysuria and frequency.   Musculoskeletal: Negative for arthralgias.  Skin: Negative for itching and rash.  Neurological: Positive for numbness. Negative for  light-headedness.  Hematological: Negative for adenopathy. Does not bruise/bleed easily.  Psychiatric/Behavioral: Negative for confusion.    MEDICAL HISTORY:  Past Medical History:  Diagnosis Date  . Anxiety   . Cancer [facility])    Rectal cancer metastasized to liver ([place])  . Diabetes mellitus without complication ([place])   . Dysrhythmia    svt  . Family history of esophageal cancer   . Family history of prostate cancer   . GERD (gastroesophageal reflux disease)   . High triglycerides   . Hypertension   . Long-term insulin use in type 2 diabetes ([place])   . Morbid obesity ([place])   . Psoriatic arthritis ([place])   . SVT (supraventricular tachycardia) ([place])     SURGICAL HISTORY: Past Surgical History:  Procedure Laterality Date  . EUS N/A 05/23/2020   Procedure: FULL UPPER ENDOSCOPIC ULTRASOUND (EUS) RADIAL;  Surgeon: Jola Schmidt, MD;  Location: [facility] ENDOSCOPY;  Service: Endoscopy;  Laterality: N/A;  . PORTA CATH INSERTION N/A 05/30/2020   Procedure: PORTA CATH INSERTION;  Surgeon: Algernon Huxley, MD;  Location: [facility];  Service: Cardiovascular;  Laterality: N/A;  . UPPER GASTROINTESTINAL ENDOSCOPY      SOCIAL HISTORY: Social History   Socioeconomic History  . Marital status: Soil scientist    Spouse name: Marc Morgans  . Number of children: Not on file  . Years of education: Not on file  . Highest education level: Not on file  Occupational History  . Not on file  Tobacco Use  . Smoking status: Never Smoker  . Smokeless tobacco: Never Used  Vaping Use  . Vaping Use: Never used  Substance and Sexual Activity  . Alcohol use: Yes    Alcohol/week: 1.0 standard drink    Types: 1 Glasses of wine per week    Comment: very occasional  . Drug use: Never  . Sexual activity: Not on file  Other Topics Concern  . Not on file  Social History Narrative   Lives in [place] partner [place].  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     Social Determinants of Health   Financial Resource  Strain: Medium Risk  . Difficulty of Paying Living Expenses: Somewhat hard  Food Insecurity: Not on file  Transportation Needs: Not on file  Physical Activity: Not on file  Stress: Not on file  Social Connections: Not on file  Intimate Partner Violence: Not on file    FAMILY HISTORY: Family History  Problem Relation Age of Onset  . Arthritis Mother   . Heart disease Mother   . Supraventricular tachycardia Mother        s/p ablation  . Hypertension Mother   . Diabetes Father   . Heart attack Maternal Uncle   . Throat cancer Maternal Uncle   . Esophageal cancer Maternal Uncle   . Heart attack Maternal Uncle   . Heart attack Maternal Uncle   . Prostate cancer Maternal Uncle   . Thyroid cancer Neg Hx   . Colon cancer Neg Hx   . Rectal cancer Neg Hx   . Stomach cancer Neg Hx   . Colon polyps Neg Hx     ALLERGIES:  has No Known Allergies.  MEDICATIONS:  Current Outpatient Medications  Medication Sig Dispense Refill  . ALPRAZolam (XANAX) 0.5 MG tablet Take half tablet to one tablet by mouth as needed daily for insomnia, anxiety. 30 tablet 0  . amLODipine (NORVASC) 10 MG tablet TAKE 1 TABLET BY MOUTH  DAILY 90 tablet 3  . aspirin EC 81 MG tablet Take 1 tablet (81 mg total) by  mouth daily. Swallow whole. 30 tablet 11  . B Complex Vitamins (VITAMIN B COMPLEX PO) Take 1 tablet by mouth daily.    . Continuous Blood Gluc Sensor (FREESTYLE LIBRE 2 SENSOR) MISC Scan glucose at least QID 2 each 11  . CONTOUR NEXT TEST test strip USE 1 STRIP TO TEST BLOOD  SUGAR TWICE DAILY 200 strip 3  . gabapentin (NEURONTIN) 100 MG capsule TAKE 1 CAPSULE(100 MG) BY MOUTH THREE TIMES DAILY (Patient taking differently: Take 100 mg by mouth daily at 6 (six) AM.) 90 capsule 0  . hydrochlorothiazide (HYDRODIURIL) 25 MG tablet Take 25 mg by mouth daily.    . insulin glargine (LANTUS SOLOSTAR) 100 UNIT/ML Solostar Pen Inject 56 Units into the skin daily. (Patient taking differently: Inject 58 Units into the  skin daily.) 51 mL 3  . insulin lispro (HUMALOG KWIKPEN) 100 UNIT/ML KwikPen Inject 18 Units into the skin 3 (three) times daily. Take with meals. Hold if premeal glucose <100 45 mL 5  . Insulin Pen Needle (PEN NEEDLES) 32G X 6 MM MISC Used to give insulin injections 4 times daily. 100 each 10  . Lancets (ONETOUCH ULTRASOFT) lancets Used to check blood sugars twice daily. 100 each 12  . lidocaine-prilocaine (EMLA) cream Apply to affected area once 30 g 3  . magic mouthwash w/lidocaine SOLN Take 5 mLs by mouth 4 (four) times daily as needed for mouth pain. 80 ml viscous lidocaine 2%, 80 ml Mylanta, 80 ml Diphenhydramine 12.5 mg/5 ml Elixir, 80 ml Nystatin 100,000 Unit suspension, 80 ml Prednisolone 15 mg/17ml, 80 ml Distilled Water.  Sig: Swish/Swallow 5-10 ml four times a day as needed. 480 mL 3  . [name] Take 5 mg by mouth at bedtime as needed.    . metFORMIN (GLUCOPHAGE XR) 500 MG 24 hr tablet Take 4 tablets (2,000 mg total) by mouth every evening. 120 tablet 3  . metoprolol succinate (TOPROL-XL) 100 MG 24 hr tablet TAKE 1 TABLET BY MOUTH  DAILY WITH OR IMMEDIATELY  FOLLOWING A MEAL 90 tablet 3  . Omega-3 Fatty Acids (FISH OIL PO) Take 800 mg by mouth daily.    . potassium chloride (KLOR-CON) 10 MEQ tablet Take 1 tablet (10 mEq total) by mouth daily. 30 tablet 0  . zinc gluconate 50 MG tablet Take 50 mg by mouth daily.    Marland Kitchen lactulose (CHRONULAC) 10 GM/15ML solution Take 15 mLs (10 g total) by mouth 2 (two) times daily as needed for moderate constipation or severe constipation. (Patient not taking: No sig reported) 236 mL 0  . loperamide (IMODIUM A-D) 2 MG tablet Take 1 tablet (2 mg total) by mouth See admin instructions. Take 2 tabs at diarrhea onset , then 1 tab after every loose bowel movement. Maximum 8 tablets within 24 hour period of time (Patient not taking: No sig reported) 100 tablet 1  . omeprazole (PRILOSEC) 20 MG capsule Take 1 capsule (20 mg total) by mouth in the morning and at  bedtime. (Patient not taking: No sig reported) 180 capsule 0  . ondansetron (ZOFRAN ODT) 4 MG disintegrating tablet Take 1 tablet (4 mg total) by mouth every 8 (eight) hours as needed for nausea or vomiting. (Patient not taking: No sig reported) 30 tablet 2  . ondansetron (ZOFRAN) 8 MG tablet Take 1 tablet (8 mg total) by mouth 2 (two) times daily as needed for refractory nausea / vomiting. Start on day 3 after chemotherapy. (Patient not taking: No sig reported) 30 tablet 1  .  polyethylene glycol powder (GLYCOLAX/MIRALAX) 17 GM/SCOOP powder Take by mouth daily. 1/2 capful QD (Patient not taking: No sig reported)    . prochlorperazine (COMPAZINE) 10 MG tablet Take 1 tablet (10 mg total) by mouth every 6 (six) hours as needed (Nausea or vomiting). (Patient not taking: No sig reported) 30 tablet 1  . senna (SENOKOT) 8.6 MG TABS tablet Take 2 tablets (17.2 mg total) by mouth daily. (Patient not taking: No sig reported) 120 tablet 0  . tamsulosin (FLOMAX) 0.4 MG CAPS capsule Take 1 capsule (0.4 mg total) by mouth daily. (Patient not taking: No sig reported) 30 capsule 1  . telmisartan (MICARDIS) 40 MG tablet Take 1 tablet (40 mg total) by mouth daily. PLEASE CALL OFFICE TO SCHEDULE FOLLOW UP APPOINTMENT FOR REFILLS. (Patient not taking: Reported on 11/27/2020) 30 tablet 1   Current Facility-Administered Medications  Medication Dose Route Frequency Provider Last Rate Last Admin  . 0.9 %  sodium chloride infusion  500 mL Intravenous Once Nelida Meuse III, MD         PHYSICAL EXAMINATION: ECOG PERFORMANCE STATUS: 1 - Symptomatic but completely ambulatory Vitals:   11/27/20 0929  BP: 134/89  Pulse: 84  Resp: 18  Temp: 98.2 F (36.8 C)   Filed Weights   11/27/20 0929  Weight: 249 lb 9.6 oz (113.2 kg)    Physical Exam Constitutional:      General: He is not in acute distress. HENT:     Head: Normocephalic and atraumatic.  Eyes:     General: No scleral icterus. Cardiovascular:     Rate and  Rhythm: Normal rate and regular rhythm.     Heart sounds: Normal heart sounds.  Pulmonary:     Effort: Pulmonary effort is normal. No respiratory distress.     Breath sounds: No wheezing.  Abdominal:     General: Bowel sounds are normal. There is no distension.     Palpations: Abdomen is soft.  Musculoskeletal:        General: No deformity. Normal range of motion.     Cervical back: Normal range of motion and neck supple.  Skin:    General: Skin is warm and dry.     Findings: No erythema or rash.  Neurological:     Mental Status: He is alert and oriented to person, place, and time. Mental status is at baseline.     Cranial Nerves: No cranial nerve deficit.     Coordination: Coordination normal.  Psychiatric:        Mood and Affect: Mood normal.     LABORATORY DATA:  I have reviewed the data as listed Lab Results  Component Value Date   WBC 6.9 11/27/2020   HGB 13.8 11/27/2020   HCT 40.2 11/27/2020   MCV 86.8 11/27/2020   PLT 237 11/27/2020   Recent Labs    04/10/20 1021 04/25/20 0942 05/07/20 0933 06/03/20 0815 06/21/20 0908 06/28/20 1019 10/30/20 0816 11/13/20 0803 11/27/20 0907  NA  --   --  136   < > 135   < > 135 137 134*  K  --   --  4.4   < > 4.8   < > 3.6 3.9 3.8  CL  --   --  98   < > 95*   < > 98 98 99  CO2  --   --  21   < > 22   < > 25 27 26   GLUCOSE  --   --  175*   < > 202*   < > 169* 275* 175*  BUN 18  --  12   < > 11   < > 13 15 19   CREATININE 0.91  --  0.86   < > 0.80   < > 0.82 0.80 0.89  CALCIUM  --   --  9.2   < > 9.2   < > 9.3 9.2 8.8*  GFRNONAA 98  --  101   < > 104   < > >60 >60 >60  GFRAA 113  --  117  --  120  --   --   --   --   PROT  --  6.5  --    < >  --    < > 7.1 7.3 7.3  ALBUMIN  --  3.1*  --    < >  --    < > 3.6 3.7 3.7  AST  --  18  --    < >  --    < > 29 32 26  ALT  --  17  --    < >  --    < > 15 21 19   ALKPHOS  --  215*  --    < >  --    < > 91 89 91  BILITOT  --  0.7  --    < >  --    < > 0.7 0.6 0.9  BILIDIR  --   0.47*  --   --   --   --   --   --   --    < > = values in this interval not displayed.   Iron/TIBC/Ferritin/ %Sat No results found for: IRON, TIBC, FERRITIN, IRONPCTSAT    RADIOGRAPHIC STUDIES: I have personally reviewed the radiological images as listed and agreed with the findings in the report. No results found.    ASSESSMENT & PLAN:  1. Rectal cancer metastasized to liver ([place])   2. Encounter for antineoplastic chemotherapy   3. Neuroendocrine carcinoma of pancreas ([place])   4. Neuropathy due to chemotherapeutic drug ([place])   5. Uncontrolled other specified diabetes mellitus with hyperglycemia ([place])   Cancer Staging Neuroendocrine carcinoma of pancreas ([place]) Staging form: Exocrine Pancreas, AJCC 8th Edition - Clinical: Stage IB (cT2, cN0, cM0) - Signed by Earlie Server, MD on 09/11/2020  Rectal cancer metastasized to liver [facility]) Staging form: Colon and Rectum, AJCC 8th Edition - Clinical stage from 06/03/2020: Stage Unknown (cTX, cNX, pM1) - Signed by Earlie Server, MD on 06/03/2020   #Stage IV rectal cancer  first-line palliative chemotherapy with FOLFOX and bevacizumab.-Infusion reaction to oxaliplatin during cycle 8 FOLFOX bevacizumab. Currently on 2nd line treatment FOLFIRI with bevacizumab Labs are reviewed and discussed with patient. Proceed with FOLFIRI with bevacizumab today.  Irinotecan 150 mg/m2 CEA fluctuates.  Have not observed a significant downward trend.  Today's CEA is pending. Discussed with patient about a second opinion at [facility] and he agrees. Will refer to [facility] GI oncology Restaging CT chest abdomen pelvis in June.  #Pancreatic well differentiated neuroendocrine carcinoma Dotatate PET scan was reviewed and discussed with patient.  Lesion has grown in size now 3cm.  He is asymptomatic from neuroendocrine carcinoma at this point.  I will hold off starting lanreotide. May consider in the future if he develops progressive symptoms.  #GERD, discontinue famotidine.   Switch to omeprazole 20 mg twice daily. # Chemotherapy induced neuropathy. Grade 1  He reports  taking gabapentin 100 mg twice daily.  Recommend patient to increase to 200 mg 2-3 times daily. # Hypokalemia, K 3.8 , continue potassium chloride 10 mEq daily.   # Uncontrolled diabetes,  continue follow-up with primary care provider for glycemic control.  Glucose level 175 today. #Episode of SVT.  Recommend patient to follow-up with cardiology.  All questions were answered. The patient knows to call the clinic with any problems questions or concerns.  cc Burnard Hawthorne, FNP    Return of visit: 2 weeks  Earlie Server, MD, PhD Hematology Oncology [facility] at [facility] Pager- [phone] 11/27/2020

## 2020-11-28 LAB — CEA: CEA: 720 ng/mL — ABNORMAL HIGH (ref 0.0–4.7)

## 2020-11-28 NOTE — Telephone Encounter (Signed)
Going to ask PCP for refills

## 2020-11-28 NOTE — Telephone Encounter (Signed)
Please schedule overdue F/U appointment. Thank you! ?

## 2020-11-29 ENCOUNTER — Other Ambulatory Visit: Payer: Self-pay

## 2020-11-29 ENCOUNTER — Inpatient Hospital Stay: Payer: Managed Care, Other (non HMO)

## 2020-11-29 ENCOUNTER — Ambulatory Visit (INDEPENDENT_AMBULATORY_CARE_PROVIDER_SITE_OTHER): Payer: Managed Care, Other (non HMO) | Admitting: Family

## 2020-11-29 ENCOUNTER — Encounter: Payer: Self-pay | Admitting: Family

## 2020-11-29 VITALS — BP 142/82 | HR 89 | Ht 69.0 in | Wt 252.1 lb

## 2020-11-29 DIAGNOSIS — Z5112 Encounter for antineoplastic immunotherapy: Secondary | ICD-10-CM | POA: Diagnosis not present

## 2020-11-29 DIAGNOSIS — I10 Essential (primary) hypertension: Secondary | ICD-10-CM | POA: Diagnosis not present

## 2020-11-29 DIAGNOSIS — F419 Anxiety disorder, unspecified: Secondary | ICD-10-CM | POA: Diagnosis not present

## 2020-11-29 DIAGNOSIS — C20 Malignant neoplasm of rectum: Secondary | ICD-10-CM

## 2020-11-29 DIAGNOSIS — G4700 Insomnia, unspecified: Secondary | ICD-10-CM

## 2020-11-29 DIAGNOSIS — E119 Type 2 diabetes mellitus without complications: Secondary | ICD-10-CM

## 2020-11-29 DIAGNOSIS — Z794 Long term (current) use of insulin: Secondary | ICD-10-CM | POA: Diagnosis not present

## 2020-11-29 DIAGNOSIS — I471 Supraventricular tachycardia, unspecified: Secondary | ICD-10-CM

## 2020-11-29 LAB — POCT GLYCOSYLATED HEMOGLOBIN (HGB A1C): Hemoglobin A1C: 9.4 % — AB (ref 4.0–5.6)

## 2020-11-29 MED ORDER — TELMISARTAN 40 MG PO TABS: 40.0000 mg | ORAL_TABLET | Freq: Every day | ORAL | 1 refills | Status: AC

## 2020-11-29 MED ORDER — HEPARIN SOD (PORK) LOCK FLUSH 100 UNIT/ML IV SOLN
500.0000 [IU] | Freq: Once | INTRAVENOUS | Status: AC | PRN
  Administered 2020-11-29: 500 [IU]
  Filled 2020-11-29: qty 5

## 2020-11-29 MED ORDER — SODIUM CHLORIDE 0.9% FLUSH
3.0000 mL | INTRAVENOUS | Status: DC | PRN
  Administered 2020-11-29: 3 mL
  Filled 2020-11-29: qty 3

## 2020-11-29 MED ORDER — HEPARIN SOD (PORK) LOCK FLUSH 100 UNIT/ML IV SOLN
INTRAVENOUS | Status: AC
  Filled 2020-11-29: qty 5

## 2020-11-29 MED ORDER — ALPRAZOLAM 0.5 MG PO TABS: ORAL_TABLET | ORAL | 1 refills | Status: DC

## 2020-11-29 NOTE — Progress Notes (Signed)
Subjective:    Patient ID: Dominic [name], male    DOB: [birth year], 50 y.o.   MRN: [id number]  CC: Dominic [name] is a 50 y.o. male who presents today for follow up.   HPI: Feels well today No new complaints  Rectal cancer- Following with Dr Tasia Catchings, he has been referred to [facility] for second opinion. He is receiving IV steriods and IV dextrose with chemotherapy every 2 weeks. 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   He is on chemo today and wearing portable pump. During chemo reports escalations in blood glucose approx  300 range and 3 days after completion, will return to baseline.   Prior to chemo days, FBG around: 200 , prior to eating, blood glucose 170 on average. Exercise bike for 3 times per week, 10 minutes.   HTN with  SVT- compliant with amlodipine 10 mg, HCTZ 25 mg daily, metoprolol succinate 100 mg daily. He is out of the  telmisartan 40 mg daily for past week.   Palpitations at baseline and improved all overall. Reports palpitations  occur 'every once and while.' No cp. No follow up with cardiology scheduled.  DM-  compliant with metformin 500mg  bid ,Lantus 56 units daily, Humalog 18 units TID w/meals. He hasnt needed to hold humalog as it has not been less than 100.   No hypoglycemic episodes.   CCM active 89%. He is eating consistently and having carb with each meals  insomnia- controlled. He feels grateful and optimistic about decreased CEA. He takes xanax 0.5mg  qd prn with relief.   Glucose 175 2 days ago. Crt 0.89  covid vaccinated with booster     HISTORY:  Past Medical History:  Diagnosis Date  . Anxiety   . Cancer [facility])    Rectal cancer metastasized to liver ([place])  . Diabetes mellitus without complication ([place])   . Dysrhythmia    svt  . Family history of esophageal cancer   . Family history of prostate cancer   . GERD (gastroesophageal reflux disease)   . High triglycerides   . Hypertension   . Long-term insulin  use in type 2 diabetes ([place])   . Morbid obesity ([place])   . Psoriatic arthritis ([place])   . SVT (supraventricular tachycardia) (HCC)    Past Surgical History:  Procedure Laterality Date  . EUS N/A 05/23/2020   Procedure: FULL UPPER ENDOSCOPIC ULTRASOUND (EUS) RADIAL;  Surgeon: Jola Schmidt, MD;  Location: [facility] ENDOSCOPY;  Service: Endoscopy;  Laterality: N/A;  . PORTA CATH INSERTION N/A 05/30/2020   Procedure: PORTA CATH INSERTION;  Surgeon: Algernon Huxley, MD;  Location: [place] CV LAB;  Service: Cardiovascular;  Laterality: N/A;  . UPPER GASTROINTESTINAL ENDOSCOPY     Family History  Problem Relation Age of Onset  . Arthritis Mother   . Heart disease Mother   . Supraventricular tachycardia Mother        s/p ablation  . Hypertension Mother   . Diabetes Father   . Heart attack Maternal Uncle   . Throat cancer Maternal Uncle   . Esophageal cancer Maternal Uncle   . Heart attack Maternal Uncle   . Heart attack Maternal Uncle   . Prostate cancer Maternal Uncle   . Thyroid cancer Neg Hx   . Colon cancer Neg Hx   . Rectal cancer Neg Hx   . Stomach cancer Neg Hx   . Colon polyps Neg Hx     Allergies: Patient has no known allergies.  Current Outpatient Medications on File Prior to Visit  Medication Sig Dispense Refill  . ALPHA LIPOIC ACID PO Take 1 capsule by mouth daily.    Marland Kitchen amLODipine (NORVASC) 10 MG tablet TAKE 1 TABLET BY MOUTH  DAILY 90 tablet 3  . aspirin EC 81 MG tablet Take 1 tablet (81 mg total) by mouth daily. Swallow whole. 30 tablet 11  . B Complex Vitamins (VITAMIN B COMPLEX PO) Take 1 tablet by mouth daily.    . Continuous Blood Gluc Sensor (FREESTYLE LIBRE 2 SENSOR) MISC Scan glucose at least QID 2 each 11  . CONTOUR NEXT TEST test strip USE 1 STRIP TO TEST BLOOD  SUGAR TWICE DAILY 200 strip 3  . gabapentin (NEURONTIN) 100 MG capsule Take 2 capsules (200 mg total) by mouth 3 (three) times daily. 180 capsule 0  . hydrochlorothiazide (HYDRODIURIL) 25 MG tablet Take  25 mg by mouth daily.    . insulin glargine (LANTUS SOLOSTAR) 100 UNIT/ML Solostar Pen Inject 56 Units into the skin daily. (Patient taking differently: Inject 58 Units into the skin daily.) 51 mL 3  . insulin lispro (HUMALOG KWIKPEN) 100 UNIT/ML KwikPen Inject 18 Units into the skin 3 (three) times daily. Take with meals. Hold if premeal glucose <100 45 mL 5  . Insulin Pen Needle (PEN NEEDLES) 32G X 6 MM MISC Used to give insulin injections 4 times daily. 100 each 10  . lactulose (CHRONULAC) 10 GM/15ML solution Take 15 mLs (10 g total) by mouth 2 (two) times daily as needed for moderate constipation or severe constipation. 236 mL 0  . Lancets (ONETOUCH ULTRASOFT) lancets Used to check blood sugars twice daily. 100 each 12  . lidocaine-prilocaine (EMLA) cream Apply to affected area once 30 g 3  . loperamide (IMODIUM A-D) 2 MG tablet Take 1 tablet (2 mg total) by mouth See admin instructions. Take 2 tabs at diarrhea onset , then 1 tab after every loose bowel movement. Maximum 8 tablets within 24 hour period of time 100 tablet 1  . magic mouthwash w/lidocaine SOLN Take 5 mLs by mouth 4 (four) times daily as needed for mouth pain. 80 ml viscous lidocaine 2%, 80 ml Mylanta, 80 ml Diphenhydramine 12.5 mg/5 ml Elixir, 80 ml Nystatin 100,000 Unit suspension, 80 ml Prednisolone 15 mg/42ml, 80 ml Distilled Water.  Sig: Swish/Swallow 5-10 ml four times a day as needed. 480 mL 3  . [name] Take 5 mg by mouth at bedtime as needed.    . metFORMIN (GLUCOPHAGE XR) 500 MG 24 hr tablet Take 4 tablets (2,000 mg total) by mouth every evening. 120 tablet 3  . metoprolol succinate (TOPROL-XL) 100 MG 24 hr tablet TAKE 1 TABLET BY MOUTH  DAILY WITH OR IMMEDIATELY  FOLLOWING A MEAL 90 tablet 3  . Omega-3 Fatty Acids (FISH OIL PO) Take 800 mg by mouth daily.    Marland Kitchen omeprazole (PRILOSEC) 20 MG capsule Take 1 capsule (20 mg total) by mouth in the morning and at bedtime. 180 capsule 0  . ondansetron (ZOFRAN ODT) 4 MG  disintegrating tablet Take 1 tablet (4 mg total) by mouth every 8 (eight) hours as needed for nausea or vomiting. 30 tablet 2  . ondansetron (ZOFRAN) 8 MG tablet Take 1 tablet (8 mg total) by mouth 2 (two) times daily as needed for refractory nausea / vomiting. Start on day 3 after chemotherapy. 30 tablet 1  . polyethylene glycol powder (GLYCOLAX/MIRALAX) 17 GM/SCOOP powder Take by mouth daily. 1/2 capful QD    .  potassium chloride (KLOR-CON) 10 MEQ tablet Take 1 tablet (10 mEq total) by mouth daily. 30 tablet 0  . prochlorperazine (COMPAZINE) 10 MG tablet Take 1 tablet (10 mg total) by mouth every 6 (six) hours as needed (Nausea or vomiting). 30 tablet 1  . senna (SENOKOT) 8.6 MG TABS tablet Take 2 tablets (17.2 mg total) by mouth daily. 120 tablet 0  . tamsulosin (FLOMAX) 0.4 MG CAPS capsule Take 1 capsule (0.4 mg total) by mouth daily. 30 capsule 1  . zinc gluconate 50 MG tablet Take 50 mg by mouth daily.     Current Facility-Administered Medications on File Prior to Visit  Medication Dose Route Frequency Provider Last Rate Last Admin  . 0.9 %  sodium chloride infusion  500 mL Intravenous Once Nelida Meuse III, MD      . sodium chloride flush (NS) 0.9 % injection 3 mL  3 mL Intracatheter PRN Earlie Server, MD   3 mL at 11/29/20 1353    Social History   Tobacco Use  . Smoking status: Never Smoker  . Smokeless tobacco: Never Used  Vaping Use  . Vaping Use: Never used  Substance Use Topics  . Alcohol use: Yes    Alcohol/week: 1.0 standard drink    Types: 1 Glasses of wine per week    Comment: very occasional  . Drug use: Never    Review of Systems  Constitutional: Negative for chills and fever.  Respiratory: Negative for cough.   Cardiovascular: Negative for chest pain and palpitations.  Gastrointestinal: Negative for nausea and vomiting.  Neurological: Positive for numbness.      Objective:    BP (!) 142/82 (BP Location: Right Arm, Patient Position: Sitting, Cuff Size: Large)    Pulse 89   Ht 5\' 9"  (1.753 m)   Wt 252 lb 1.9 oz (114.4 kg)   SpO2 99%   BMI 37.23 kg/m  BP Readings from Last 3 Encounters:  11/29/20 (!) 142/82  11/27/20 134/89  11/15/20 (!) 152/86   Wt Readings from Last 3 Encounters:  11/29/20 252 lb 1.9 oz (114.4 kg)  11/27/20 249 lb 9.6 oz (113.2 kg)  11/13/20 249 lb 8 oz (113.2 kg)    Physical Exam Vitals reviewed.  Constitutional:      Appearance: He is well-developed.  Cardiovascular:     Rate and Rhythm: Regular rhythm.     Heart sounds: Normal heart sounds.  Pulmonary:     Effort: Pulmonary effort is normal. No respiratory distress.     Breath sounds: Normal breath sounds. No wheezing, rhonchi or rales.  Skin:    General: Skin is warm and dry.  Neurological:     Mental Status: He is alert.  Psychiatric:        Speech: Speech normal.        Behavior: Behavior normal.        Assessment & Plan:   Problem List Items Addressed This Visit      Cardiovascular and Mediastinum   Hypertension    Uncontrolled. Continue amlodipine 10 mg, HCTZ 25 mg daily, metoprolol succinate 100 mg daily. Resume telmisartan 40 mg daily       Relevant Medications   telmisartan (MICARDIS) 40 MG tablet   SVT (supraventricular tachycardia) (HCC)    Chronic, controlled. Continue  metoprolol succinate 100 mg daily.      Relevant Medications   telmisartan (MICARDIS) 40 MG tablet     Endocrine   Type 2 diabetes mellitus without complication, with long-term current  use of insulin ([place]) - Primary    Lab Results  Component Value Date   HGBA1C 9.4 (A) 11/29/2020  uncontrolled. Complicated by rectal cancer with pancreatic metastasis.increase humalog to 20 units with largest meal. Advised if 2 hours post prandial not approach 180, advised to increase all three meals to humalog 20 units from the 18 units. Continue metformin 500mg  BID ( declines maximizing due to interfering with b12 which I was unaware of), lantus 56 units.  Referral to endocrine.        Relevant Medications   telmisartan (MICARDIS) 40 MG tablet   Other Relevant Orders   Ambulatory referral to Endocrinology   POCT HgB A1C (Completed)     Other   Insomnia    Controlled. Continue xanax 0.5mg  qd prn       Other Visit Diagnoses    Essential hypertension       Relevant Medications   telmisartan (MICARDIS) 40 MG tablet   Anxiety       Relevant Medications   ALPRAZolam (XANAX) 0.5 MG tablet       I am having [name] maintain his [name], metoprolol succinate, Pen Needles, ondansetron, Contour Next Test, tamsulosin, lidocaine-prilocaine, prochlorperazine, ondansetron, polyethylene glycol powder, amLODipine, hydrochlorothiazide, metFORMIN, senna, lactulose, Omega-3 Fatty Acids (FISH OIL PO), zinc gluconate, loperamide, aspirin EC, magic mouthwash w/lidocaine, Lantus SoloStar, FreeStyle Libre 2 Sensor, insulin lispro, onetouch ultrasoft, omeprazole, potassium chloride, B Complex Vitamins (VITAMIN B COMPLEX PO), gabapentin, ALPHA LIPOIC ACID PO, telmisartan, and ALPRAZolam. We will continue to administer sodium chloride.   Meds ordered this encounter  Medications  . telmisartan (MICARDIS) 40 MG tablet    Sig: Take 1 tablet (40 mg total) by mouth daily. PLEASE CALL OFFICE TO SCHEDULE FOLLOW UP APPOINTMENT FOR REFILLS.    Dispense:  90 tablet    Refill:  1    PATIENT NEED TO CALL OFFICE FOR APPOINTMENT.    Order Specific Question:   Supervising Provider    Answer:   Crecencio Mc [[id number]]  . ALPRAZolam (XANAX) 0.5 MG tablet    Sig: Take half tablet to one tablet by mouth as needed daily for insomnia, anxiety.    Dispense:  30 tablet    Refill:  1    Order Specific Question:   Supervising Provider    Answer:   Crecencio Mc [[id number]]    Return precautions given.   Risks, benefits, and alternatives of the medications and treatment plan prescribed today were discussed, and patient expressed understanding.   Education regarding symptom management and  diagnosis given to patient on AVS.  Continue to follow with Burnard Hawthorne, FNP for routine health maintenance.   [name] and I agreed with plan.   Mable Paris, FNP

## 2020-11-29 NOTE — Progress Notes (Signed)
Patient chemo pump D/C'd today, no concerns voiced. Patient discharged, stable.

## 2020-11-29 NOTE — Assessment & Plan Note (Signed)
Controlled. Continue xanax 0.5mg  qd prn

## 2020-11-29 NOTE — Patient Instructions (Addendum)
resume telmisartan. It is imperative that you are seen AT least twice per year for labs and monitoring. Monitor blood pressure at home and me 5-6 reading on separate days. Goal is less than 120/80, based on newest guidelines, however we certainly want to be less than 130/80;  if persistently higher, please make sooner follow up appointment so we can recheck you blood pressure and manage/ adjust medications.  Increase humalog to 20 units with largest meal. Remain at 18 units with other two meals.   Referral endocrine  Let us know if you dont hear back within a week in regards to an appointment being scheduled.

## 2020-11-29 NOTE — Assessment & Plan Note (Signed)
Uncontrolled. Continue amlodipine 10 mg, HCTZ 25 mg daily, metoprolol succinate 100 mg daily. Resume telmisartan 40 mg daily

## 2020-11-29 NOTE — Assessment & Plan Note (Signed)
Chronic, controlled. Continue  metoprolol succinate 100 mg daily.

## 2020-11-29 NOTE — Assessment & Plan Note (Addendum)
Lab Results  Component Value Date   HGBA1C 9.4 (A) 11/29/2020  uncontrolled. Complicated by rectal cancer with pancreatic metastasis.increase humalog to 20 units with largest meal. Advised if 2 hours post prandial not approach 180, advised to increase all three meals to humalog 20 units from the 18 units. Continue metformin 500mg  BID ( declines maximizing due to interfering with b12 which I was unaware of), lantus 56 units.  Referral to endocrine.

## 2020-12-02 ENCOUNTER — Other Ambulatory Visit: Payer: Self-pay

## 2020-12-02 ENCOUNTER — Encounter: Payer: Self-pay | Admitting: Oncology

## 2020-12-02 ENCOUNTER — Telehealth: Payer: Self-pay

## 2020-12-02 MED ORDER — MICROLET LANCETS MISC: 5 refills | Status: AC

## 2020-12-02 NOTE — Telephone Encounter (Addendum)
Called [facility] and received correct fax number for referral.  Records faxed to [phone].

## 2020-12-02 NOTE — Telephone Encounter (Signed)
Medication approved, valid 12/02/20 to 12/02/21.

## 2020-12-02 NOTE — Telephone Encounter (Signed)
PA request submitted for Omeprazole 20 mg 1 BID vial cover my meds (Key: [id number])

## 2020-12-02 NOTE — Progress Notes (Signed)
Error

## 2020-12-10 ENCOUNTER — Other Ambulatory Visit: Payer: Self-pay | Admitting: *Deleted

## 2020-12-10 ENCOUNTER — Telehealth: Payer: Self-pay | Admitting: Family

## 2020-12-10 MED ORDER — GABAPENTIN 100 MG PO CAPS: 200.0000 mg | ORAL_CAPSULE | Freq: Three times a day (TID) | ORAL | 0 refills | Status: DC

## 2020-12-10 NOTE — Telephone Encounter (Signed)
Call [facility] endocrine Dr Joycie Peek office  Pt has appt with her end of August  I wanted to see if any way we can move this up.   His uncontrolled DM is complicated by rectal cancer with pancreatic metastasis.   Any ability to move up would be greatly appreciated

## 2020-12-11 ENCOUNTER — Ambulatory Visit: Payer: Managed Care, Other (non HMO) | Attending: Critical Care Medicine

## 2020-12-11 ENCOUNTER — Telehealth: Payer: Self-pay | Admitting: Oncology

## 2020-12-11 ENCOUNTER — Inpatient Hospital Stay: Payer: Managed Care, Other (non HMO) | Admitting: Oncology

## 2020-12-11 ENCOUNTER — Other Ambulatory Visit: Payer: Self-pay | Admitting: *Deleted

## 2020-12-11 ENCOUNTER — Inpatient Hospital Stay: Payer: Managed Care, Other (non HMO)

## 2020-12-11 ENCOUNTER — Inpatient Hospital Stay: Payer: Managed Care, Other (non HMO) | Attending: Oncology

## 2020-12-11 DIAGNOSIS — I10 Essential (primary) hypertension: Secondary | ICD-10-CM | POA: Insufficient documentation

## 2020-12-11 DIAGNOSIS — Z20822 Contact with and (suspected) exposure to covid-19: Secondary | ICD-10-CM

## 2020-12-11 DIAGNOSIS — E119 Type 2 diabetes mellitus without complications: Secondary | ICD-10-CM | POA: Insufficient documentation

## 2020-12-11 DIAGNOSIS — Z79899 Other long term (current) drug therapy: Secondary | ICD-10-CM | POA: Insufficient documentation

## 2020-12-11 DIAGNOSIS — K6289 Other specified diseases of anus and rectum: Secondary | ICD-10-CM | POA: Insufficient documentation

## 2020-12-11 DIAGNOSIS — E876 Hypokalemia: Secondary | ICD-10-CM | POA: Insufficient documentation

## 2020-12-11 DIAGNOSIS — Z7951 Long term (current) use of inhaled steroids: Secondary | ICD-10-CM | POA: Insufficient documentation

## 2020-12-11 DIAGNOSIS — Z5111 Encounter for antineoplastic chemotherapy: Secondary | ICD-10-CM | POA: Insufficient documentation

## 2020-12-11 DIAGNOSIS — Z794 Long term (current) use of insulin: Secondary | ICD-10-CM | POA: Insufficient documentation

## 2020-12-11 DIAGNOSIS — C20 Malignant neoplasm of rectum: Secondary | ICD-10-CM | POA: Insufficient documentation

## 2020-12-11 DIAGNOSIS — T451X5A Adverse effect of antineoplastic and immunosuppressive drugs, initial encounter: Secondary | ICD-10-CM | POA: Insufficient documentation

## 2020-12-11 DIAGNOSIS — Z7982 Long term (current) use of aspirin: Secondary | ICD-10-CM | POA: Insufficient documentation

## 2020-12-11 DIAGNOSIS — D3A8 Other benign neuroendocrine tumors: Secondary | ICD-10-CM | POA: Insufficient documentation

## 2020-12-11 DIAGNOSIS — C787 Secondary malignant neoplasm of liver and intrahepatic bile duct: Secondary | ICD-10-CM | POA: Insufficient documentation

## 2020-12-11 DIAGNOSIS — G620 Drug-induced polyneuropathy: Secondary | ICD-10-CM | POA: Insufficient documentation

## 2020-12-11 MED ORDER — GABAPENTIN 100 MG PO CAPS: 200.0000 mg | ORAL_CAPSULE | Freq: Three times a day (TID) | ORAL | 0 refills | Status: AC

## 2020-12-11 NOTE — Telephone Encounter (Signed)
I spoke with Anderson Malta at [facility] let her know situation. There nothing available, but sent message back to see if Dr. Gabriel Carina given patient's situation could work him in sooner.

## 2020-12-11 NOTE — Telephone Encounter (Signed)
Patient came in with Headache, runny nose, sore throat, cough --- going on for 2 days  Negative at home test -- symptoms going on for 2 days.   Called Dr. Tasia Catchings & confirmed patient go get tested

## 2020-12-12 ENCOUNTER — Ambulatory Visit: Admission: RE | Admit: 2020-12-12 | Payer: Managed Care, Other (non HMO) | Source: Ambulatory Visit

## 2020-12-12 LAB — SARS-COV-2, NAA 2 DAY TAT

## 2020-12-12 LAB — NOVEL CORONAVIRUS, NAA: SARS-CoV-2, NAA: NOT DETECTED

## 2020-12-12 NOTE — Telephone Encounter (Signed)
Confirmed that [facility] received the referral and they have been trying to contact patient to schedule an appt.  We sent patient MyChart message on 5/26 informing him [facility] was trying to contact him.

## 2020-12-13 ENCOUNTER — Inpatient Hospital Stay: Payer: Managed Care, Other (non HMO)

## 2020-12-13 ENCOUNTER — Telehealth: Payer: Self-pay | Admitting: Oncology

## 2020-12-13 NOTE — Telephone Encounter (Signed)
Dr. Tasia Catchings, is it OK to schedule him despite still having symptoms at this time?

## 2020-12-13 NOTE — Telephone Encounter (Signed)
Pt called to report that his Covid test was negative. Wanted to know bout getting his treatment rescheduled.

## 2020-12-13 NOTE — Telephone Encounter (Signed)
Returned call to patient and he still having symptoms. He reported that he didn't return to work today as he was not feeling well. Please advise.

## 2020-12-13 NOTE — Telephone Encounter (Signed)
[place] received from Dr. Tasia Catchings for patient to see NP in [facility] Monday if still symtomatic.  If not he can get scheduled for NP with tx.  Will follow up patient on Monday to see how he is feeling.

## 2020-12-16 DIAGNOSIS — K219 Gastro-esophageal reflux disease without esophagitis: Secondary | ICD-10-CM | POA: Insufficient documentation

## 2020-12-16 DIAGNOSIS — E781 Pure hyperglyceridemia: Secondary | ICD-10-CM | POA: Insufficient documentation

## 2020-12-16 DIAGNOSIS — E1101 Type 2 diabetes mellitus with hyperosmolarity with coma: Secondary | ICD-10-CM | POA: Insufficient documentation

## 2020-12-16 NOTE — Telephone Encounter (Signed)
Unable to reach pt by phone. Left VM for him to return call or sent Mychart message giving an update on symptoms and to r/s him for tx if he is feeling better.

## 2020-12-16 NOTE — Telephone Encounter (Signed)
Spoke to patient today and he states that he is still having mild cough and rattling in lungs. He went to urgent care today due to symptoms and was diagonsed with Pneumonia. He was prescribed Amoxicillin and Benzonatate and would like to know if these medications are ok to take since he is on chemo (last chemo on 5/18)?  I notified him that Dr. Tasia Catchings would like him to r/s tx to this week and see NP, if he was feeling better, but he states he would rather not have treatment this week. Please advise regarding medicaiton questions and when to r/s for treatment.

## 2020-12-17 NOTE — Telephone Encounter (Signed)
Dr. Tasia Catchings, Jul[facility]ier.   Patient notified of NP recommendations. Dominic [name]. please scheduled pt for lab/MD/ Folfiri/ Bev ; Dc pump day 3 - next week please and call pt pt with appts.

## 2020-12-17 NOTE — Telephone Encounter (Signed)
Medications are ok to take with his chemo. Let's plan to see him for possible treatment next week. If symptoms don't continue to improve however, may need to change antibiotics and/or hold chemotherapy. Would consider chest x-ray in 6-8 weeks to ensure resolution of pneumonia and ensure no underlying lung nodularity.

## 2020-12-18 ENCOUNTER — Telehealth: Payer: Self-pay | Admitting: Oncology

## 2020-12-18 NOTE — Telephone Encounter (Signed)
Spoke with patient to reschedule his missed appt due to illness. Patient confirmed 12/24/20.

## 2020-12-20 ENCOUNTER — Telehealth: Payer: Self-pay | Admitting: Family

## 2020-12-20 NOTE — Telephone Encounter (Signed)
Patient went to [facility] on Monday, 12/16/20, he was test 3x for covid, all tests negative. Patient called today and said he is finishing his antibiotics for walking pneumonia and his chest is rattling. Patient was transferred to [place] at [facility]. No appointments available.

## 2020-12-20 NOTE — Telephone Encounter (Signed)
I called patient & he stated that he has been seen at [facility] in [place]. He was prescribed antibiotics for pneumonia. He feels that he needs an extension of antibiotics or they are not working well enough. He will be seen at [facility] in [place] where today where he lives. Since patient is high risk due to colorectal cancer & chemo I scheduled him VV 2:30p w/ Sharyn Lull on Monday. He will call early Monday if he feels he needs to cancel appointment here & feels he got appropriate care at [facility].

## 2020-12-20 NOTE — Telephone Encounter (Signed)
[facility] Documentation   

## 2020-12-20 NOTE — Telephone Encounter (Signed)
Noted will wait for triage note.

## 2020-12-23 ENCOUNTER — Encounter: Payer: Self-pay | Admitting: Adult Health

## 2020-12-23 ENCOUNTER — Telehealth (INDEPENDENT_AMBULATORY_CARE_PROVIDER_SITE_OTHER): Payer: Managed Care, Other (non HMO) | Admitting: Adult Health

## 2020-12-23 VITALS — Wt 247.0 lb

## 2020-12-23 DIAGNOSIS — J069 Acute upper respiratory infection, unspecified: Secondary | ICD-10-CM | POA: Diagnosis not present

## 2020-12-23 DIAGNOSIS — R059 Cough, unspecified: Secondary | ICD-10-CM | POA: Diagnosis not present

## 2020-12-23 DIAGNOSIS — C20 Malignant neoplasm of rectum: Secondary | ICD-10-CM | POA: Diagnosis not present

## 2020-12-23 DIAGNOSIS — B9689 Other specified bacterial agents as the cause of diseases classified elsewhere: Secondary | ICD-10-CM

## 2020-12-23 DIAGNOSIS — J40 Bronchitis, not specified as acute or chronic: Secondary | ICD-10-CM

## 2020-12-23 DIAGNOSIS — D849 Immunodeficiency, unspecified: Secondary | ICD-10-CM

## 2020-12-23 DIAGNOSIS — C787 Secondary malignant neoplasm of liver and intrahepatic bile duct: Secondary | ICD-10-CM

## 2020-12-23 MED ORDER — ALBUTEROL SULFATE HFA 108 (90 BASE) MCG/ACT IN AERS: 2.0000 | INHALATION_SPRAY | Freq: Four times a day (QID) | RESPIRATORY_TRACT | 0 refills | Status: DC | PRN

## 2020-12-23 MED ORDER — LEVOFLOXACIN 750 MG PO TABS: 750.0000 mg | ORAL_TABLET | ORAL | 0 refills | Status: DC

## 2020-12-23 MED ORDER — BENZONATATE 100 MG PO CAPS: 100.0000 mg | ORAL_CAPSULE | Freq: Two times a day (BID) | ORAL | 0 refills | Status: DC | PRN

## 2020-12-23 MED ORDER — FLUTICASONE-SALMETEROL 100-50 MCG/ACT IN AEPB: 1.0000 | INHALATION_SPRAY | Freq: Two times a day (BID) | RESPIRATORY_TRACT | 0 refills | Status: DC

## 2020-12-23 NOTE — Patient Instructions (Signed)
Cough, Adult A cough helps to clear your throat and lungs. A cough may be a sign of anillness or another medical condition. An acute cough may only last 2-3 weeks, while a chronic cough may last 8 ormore weeks. Many things can cause a cough. They include: Germs (viruses or bacteria) that attack the airway. Breathing in things that bother (irritate) your lungs. Allergies. Asthma. Mucus that runs down the back of your throat (postnasal drip). Smoking. Acid backing up from the stomach into the tube that moves food from the mouth to the stomach (gastroesophageal reflux). Some medicines. Lung problems. Other medical conditions, such as heart failure or a blood clot in the lung (pulmonary embolism). Follow these instructions at home: Medicines Take over-the-counter and prescription medicines only as told by your doctor. Talk with your doctor before you take medicines that stop a cough (cough suppressants). Lifestyle  Do not smoke, and try not to be around smoke. Do not use any products that contain nicotine or tobacco, such as cigarettes, e-cigarettes, and chewing tobacco. If you need help quitting, ask your doctor. Drink enough fluid to keep your pee (urine) pale yellow. Avoid caffeine. Do not drink alcohol if your doctor tells you not to drink.  General instructions  Watch for any changes in your cough. Tell your doctor about them. Always cover your mouth when you cough. Stay away from things that make you cough, such as perfume, candles, campfire smoke, or cleaning products. If the air is dry, use a cool mist vaporizer or humidifier in your home. If your cough is worse at night, try using extra pillows to raise your head up higher while you sleep. Rest as needed. Keep all follow-up visits as told by your doctor. This is important.  Contact a doctor if: You have new symptoms. You cough up pus. Your cough does not get better after 2-3 weeks, or your cough gets worse. Cough medicine  does not help your cough and you are not sleeping well. You have pain that gets worse or pain that is not helped with medicine. You have a fever. You are losing weight and you do not know why. You have night sweats. Get help right away if: You cough up blood. You have trouble breathing. Your heartbeat is very fast. These symptoms may be an emergency. Do not wait to see if the symptoms will go away. Get medical help right away. Call your local emergency services (911 in the [place] Do not drive yourself to the hospital. Summary A cough helps to clear your throat and lungs. Many things can cause a cough. Take over-the-counter and prescription medicines only as told by your doctor. Always cover your mouth when you cough. Contact a doctor if you have new symptoms or you have a cough that does not get better or gets worse. This information is not intended to replace advice given to you by your health care provider. Make sure you discuss any questions you have with your healthcare provider. Document Revised: 08/18/2019 Document Reviewed: 07/18/2018 Elsevier Patient Education  [place]. Fluticasone; Salmeterol Metered Dose Inhaler (MDI) What is this medication? FLUTICASONE; SALMETEROL (floo TIK a sone; sal ME te role) treats asthma and chronic obstructive pulmonary disease (COPD). It works by opening the airways of the lungs, making it easier to breathe. It is a combination of an inhaled steroid and a bronchodilator. It is often called a controller inhaler. Do notuse it to treat a sudden asthma attack. This medicine may be used for  other purposes; ask your health care provider orpharmacist if you have questions. COMMON BRAND NAME(S): Advair HFA What should I tell my care team before I take this medication? They need to know if you have any of these conditions: Diabetes (high blood sugar) Eye disease, such as glaucoma, cataracts, or blurred vision Heart disease High blood  pressure Immune system problems Infections, such as tuberculosis (TB) or other bacterial, fungal, or viral infections Irregular heartbeat or rhythm Liver disease Osteoporosis, weak bones Seizures Taking other steroids like dexamethasone or prednisone Thyroid disease An unusual or allergic reaction to fluticasone, salmeterol, other corticosteroids, other medications, foods, dyes, or preservatives Pregnant or trying to get pregnant Breast-feeding How should I use this medication? This medication is inhaled through the mouth. Rinse your mouth with water after use. Make sure not to swallow the water. Take it as directed on the prescription label at the same time every day. Do not use it more often thandirected. This medication comes with INSTRUCTIONS FOR USE. Ask your pharmacist for directions on how to use this medication. Read the information carefully. Talkto your pharmacist or care team if you have questions. Talk to your care team about the use of this medication in children. While it may be prescribed for children as young as 12 years for selected conditions,precautions do apply. Overdosage: If you think you have taken too much of this medicine contact apoison control center or emergency room at once. NOTE: This medicine is only for you. Do not share this medicine with others. What if I miss a dose? If you miss a dose, use it as soon as you can. If it is almost time for yournext dose, use only that dose. Do not use double or extra doses. What may interact with this medication? Do not take this medication with any of the following: MAOIs like Carbex, Eldepryl, Marplan, Nardil, and Parnate This medication may also interact with the following: Aminophylline or theophylline Antiviral medications for HIV or AIDS Beta-blockers like metoprolol and propranolol Certain antibiotics like clarithromycin, erythromycin, levofloxacin, linezolid, and telithromycin Certain medications for fungal  infections like ketoconazole, itraconazole, posaconazole, voriconazole Conivaptan Diuretics Medications for colds Medications for depression or emotional conditions Nefazodone Vaccines This list may not describe all possible interactions. Give your health care provider a list of all the medicines, herbs, non-prescription drugs, or dietary supplements you use. Also tell them if you smoke, drink alcohol, or use illegaldrugs. Some items may interact with your medicine. What should I watch for while using this medication? Visit your care team for regular checks on your progress. Tell your care teamif your symptoms do not start to get better or if they get worse. Talk to your care team about how to treat an acute asthma attack or bronchospasm (wheezing). Be sure to always have a short-acting inhaler with you. If you use your short-acting inhaler and your symptoms do not get betteror if they get worse, call your care team right away. You and your care team should develop an Asthma Action Plan that is just for you. Be sure to know what to do if you are in the yellow (asthma is gettingworse) or red (medical alert) zones. This medication can worsen breathing or cause wheezing right after you use it. Be sure you have a short-acting inhaler for acute attacks (wheezing) nearby. Ifthis happens, stop using this medication right away and call your care team. This medication may increase your risk of dying from asthma-related problems.Talk to your care team if you have  questions. This medication may increase your risk of getting an infection. Call your care team for advice if you get a fever, chills, sore throat, or other symptoms of a cold or flu. Do not treat yourself. Try to avoid being around people who are sick. If you have not had the measles or chickenpox vaccines, tell your careteam right away if you are around someone with these viruses. This medication may slow your child's growth if it is taken for a long  time athigh doses. Your care team will monitor your child's growth. Using this medication for a long time may weaken your bones. The risk of bonefractures may be increased. Talk to your care team about your bone health. This medication may increase blood sugar. Ask your care team if changes in dietor medications are needed if you have diabetes. Do not treat yourself for coughs, colds or allergies without asking your careteam for advice. Some nonprescription medications can affect this one. What side effects may I notice from receiving this medication? Side effects that you should report to your care team as soon as possible: Allergic reactions-skin rash, itching, hives, swelling of the face, lips, tongue, or throat Flu-like symptoms-fever, chills, muscle pain, cough, headache, fatigue Heart rhythm changes-fast or irregular heartbeat, dizziness, feeling faint or lightheaded, chest pain, trouble breathing Increase in blood pressure Low adrenal gland function-nausea, vomiting, loss of appetite, unusual weakness, fatigue, dizziness Muscle pain or cramps Pain, tingling, or numbness in the hands or feet Sinus pain or pressure around the face or forehead Thrush-white patches in the mouth Wheezing or trouble breathing that is worse after use Side effects that usually do not require medical attention (report to your careteam if they continue or are bothersome): Change in taste Cough Dry mouth Headache Hoarseness Sore throat Tremors or shaking Trouble sleeping This list may not describe all possible side effects. Call your doctor for medical advice about side effects. You may report side effects to FDA at1-800-FDA-1088. Where should I keep my medication? Keep out of the reach of children and pets. Store at room temperature between 20 and 25 degrees C (68 and 77 degrees F). Keep inhaler away from extreme heat. Get rid of it when the dose counter reads"000" or after the expiration date, whichever is  first. NOTE: This sheet is a summary. It may not cover all possible information. If you have questions about this medicine, talk to your doctor, pharmacist, orhealth care provider.  2022 Elsevier/Gold Standard (2020-08-06 09:47:31) Benzonatate capsules What is this medication? BENZONATATE (ben ZOE na tate) is used to treat cough. This medicine may be used for other purposes; ask your health care provider orpharmacist if you have questions. COMMON BRAND NAME(S): Tessalon Perles, Zonatuss What should I tell my care team before I take this medication? They need to know if you have any of these conditions: kidney or liver disease an unusual or allergic reaction to benzonatate, anesthetics, other medicines, foods, dyes, or preservatives pregnant or trying to get pregnant breast-feeding How should I use this medication? Take this medicine by mouth with a glass of water. Follow the directions on the prescription label. Avoid breaking, chewing, or sucking the capsule, as this can cause serious side effects. Take your medicine at regular intervals. Do nottake your medicine more often than directed. Talk to your pediatrician regarding the use of this medicine in children. While this drug may be prescribed for children as young as 51 years old for selectedconditions, precautions do apply. Overdosage: If you think you  have taken too much of this medicine contact apoison control center or emergency room at once. NOTE: This medicine is only for you. Do not share this medicine with others. What if I miss a dose? If you miss a dose, take it as soon as you can. If it is almost time for yournext dose, take only that dose. Do not take double or extra doses. What may interact with this medication? Do not take this medicine with any of the following medications: MAOIs like Carbex, Eldepryl, Marplan, Nardil, and Parnate This list may not describe all possible interactions. Give your health care provider a list of  all the medicines, herbs, non-prescription drugs, or dietary supplements you use. Also tell them if you smoke, drink alcohol, or use illegaldrugs. Some items may interact with your medicine. What should I watch for while using this medication? Tell your doctor if your symptoms do not improve or if they get worse. If youhave a high fever, skin rash, or headache, see your health care professional. You may get drowsy or dizzy. Do not drive, use machinery, or do anything that needs mental alertness until you know how this medicine affects you. Do not sit or stand up quickly, especially if you are an older patient. This reduces therisk of dizzy or fainting spells. What side effects may I notice from receiving this medication? Side effects that you should report to your doctor or health care professionalas soon as possible: allergic reactions like skin rash, itching or hives, swelling of the face, lips, or tongue breathing problems chest pain confusion or hallucinations irregular heartbeat numbness of mouth or throat seizures Side effects that usually do not require medical attention (report to yourdoctor or health care professional if they continue or are bothersome): burning feeling in the eyes constipation headache nasal congestion stomach upset This list may not describe all possible side effects. Call your doctor for medical advice about side effects. You may report side effects to FDA at1-800-FDA-1088. Where should I keep my medication? Keep out of the reach of children. Store at room temperature between 15 and 30 degrees C (59 and 86 degrees F). Keep tightly closed. Protect from light and moisture. Throw away any unusedmedicine after the expiration date. NOTE: This sheet is a summary. It may not cover all possible information. If you have questions about this medicine, talk to your doctor, pharmacist, orhealth care provider.  2022 Elsevier/Gold Standard (2007-09-28 14:52:56) Levofloxacin  Tablets What is this medication? LEVOFLOXACIN (lee voe FLOX a sin) treats infections caused by bacteria. It belongs to a group of medications called quinolone antibiotics. It will nottreat colds, the flu, or infections caused by viruses. This medicine may be used for other purposes; ask your health care provider orpharmacist if you have questions. COMMON BRAND NAME(S): Levaquin, Levaquin Leva-Pak What should I tell my care team before I take this medication? They need to know if you have any of these conditions: Bone problems Diabetes Heart disease High blood pressure History of irregular heartbeat History of low levels of potassium in the blood Joint problems Kidney disease Liver disease Mental illness Myasthenia gravis Seizures Tendon problems Tingling of the fingers or toes, or other nerve disorder An unusual or allergic reaction to levofloxacin, other quinolone antibiotics, foods, dyes, or preservatives Pregnant or trying to get pregnant Breast-feeding How should I use this medication? Take this medication by mouth with a full glass of water. Follow the directions on the prescription label. You can take it with or without food. If  it upsets your stomach, take it with food. Take your medication at regular intervals. Do not take your medication more often than directed. Take all of your medication as directed even if you think you are better. Do not skip doses or stop yourmedication early. Avoid antacids, calcium, iron, and zinc products for 2 hours before and 2 hoursafter taking a dose of this medication. A special MedGuide will be given to you by the pharmacist with eachprescription and refill. Be sure to read this information carefully each time. Talk to your care team about the use of this medication in children. While this medication may be prescribed for children as young as 6 months for selectedconditions, precautions do apply. Overdosage: If you think you have taken too much of  this medicine contact apoison control center or emergency room at once. NOTE: This medicine is only for you. Do not share this medicine with others. What if I miss a dose? If you miss a dose, take it as soon as you remember. If it is almost time foryour next dose, take only that dose. Do not take double or extra doses. What may interact with this medication? Do not take this medication with any of the following: Cisapride Dronedarone Pimozide Thioridazine This medication may also interact with the following: Antacids Birth control pills Certain medications for diabetes, like glipizide, glyburide, or insulin Certain medications that treat or prevent blood clots like warfarin Didanosine buffered tablets or powder Multivitamins NSAIDS, medications for pain and inflammation, like ibuprofen or naproxen Other medications that prolong the QT interval (cause an abnormal heart rhythm) like dofetilide, ziprasidone Steroid medications like prednisone or cortisone Sucralfate Theophylline This list may not describe all possible interactions. Give your health care provider a list of all the medicines, herbs, non-prescription drugs, or dietary supplements you use. Also tell them if you smoke, drink alcohol, or use illegaldrugs. Some items may interact with your medicine. What should I watch for while using this medication? Tell your care team if your symptoms do not start to get better or if they getworse. Do not treat diarrhea with over the counter products. Contact your care team ifyou have diarrhea that lasts more than 2 days or if it is severe and watery. Check with your care team if you get an attack of severe diarrhea, nausea and vomiting, or if you sweat a lot. The loss of too much body fluid can make itdangerous for you to take this medication. This medication may increase blood sugar. Ask your care team if changes in dietor medications are needed if you have diabetes. You may get drowsy or  dizzy. Do not drive, use machinery, or do anything that needs mental alertness until you know how this medication affects you. Do not sit or stand up quickly, especially if you are an older patient. This reducesthe risk of dizzy or fainting spells. This medication can make you more sensitive to the sun. Keep out of the sun. If you cannot avoid being in the sun, wear protective clothing and use sunscreen.Do not use sun lamps or tanning beds/booths. What side effects may I notice from receiving this medication? Side effects that you should report to your care team as soon as possible: Allergic reactions-skin rash, itching, hives, swelling of the face, lips, tongue, or throat Heart rhythm changes-fast or irregular heartbeat, dizziness, feeling faint or lightheaded, chest pain, trouble breathing Increased pressure around the brain-severe headache, blurry vision, change in vision, nausea, vomiting Joint, muscle, or tendon pain, swelling, or stiffness  Liver injury-right upper belly pain, loss of appetite, nausea, light-colored stool, dark yellow or brown urine, yellowing skin or eyes, unusual weakness or fatigue Mood and behavior changes-anxiety, nervousness, confusion, hallucinations, irritability, hostility, thoughts of suicide or self-harm, worsening mood, feelings of depression Pain, tingling, or numbness in the hands or feet Redness, blistering, peeling, or loosening of the skin, including inside the mouth Seizures Severe diarrhea, fever Sudden or severe chest, back, or stomach pain Unusual vaginal discharge, itching, or odor Side effects that usually do not require medical attention (report to your careteam if they continue or are bothersome): Diarrhea Dizziness Headache Nausea Sensitivity to light Trouble sleeping This list may not describe all possible side effects. Call your doctor for medical advice about side effects. You may report side effects to FDA at1-800-FDA-1088. Where should I  keep my medication? Keep out of the reach of children. Store at room temperature between 15 and 30 degrees C (59 and 86 degrees F). Keep in a tightly closed container. Throw away any unused medication after theexpiration date. NOTE: This sheet is a summary. It may not cover all possible information. If you have questions about this medicine, talk to your doctor, pharmacist, orhealth care provider.  2022 Elsevier/Gold Standard (2020-08-29 15:01:25)

## 2020-12-23 NOTE — Progress Notes (Signed)
Virtual Visit via Video switched to Telephone, his video would not work - Note  I connected with Dominic [name] on 12/23/20 at  2:30 PM EDT by a video enabled telemedicine application and verified that I am speaking with the correct person using two identifiers.  Parties involved in visit as below:    Location: Patient: at home  Provider: Provider: Provider's office at  [facility], [place].      I discussed the limitations of evaluation and management by telemedicine and the availability of in person appointments. The patient expressed understanding and agreed to proceed.  History of Present Illness: Patient is a 50 year old male in no acute distress who comes by telephone visit as his video visit would not work.   12/17/2018 Seen at [facility] urgent care, was treated with Augmentin/ Doxycycline and benzonate. He felt he was getting some better but then started feeling worse again a few days later. No x ray or labs.    He is undergoing chemotherapy stage colorectal cancer has had to cancel treatments.   Urgent care at [place] seen 12/21/20, was given Mucinex, also Prednisone, chest x ray was repeated. He feels some better today as far as energy, dry cough. Denies any phlegm during the day occasionally has some at night. Denies any fever.   Has some chest tightness and wheezing at times " bronchial cough".Denies any chest pain. No labs were done at urgent care.  FINDINGS: Right chest Port-A-Cath. The cardiomediastinal silhouette is unremarkable.   The lungs are clear.  No effusion or pneumothorax. Mild thoracic spondylosis.      Imaging Results - X-Ray Chest PA and Lateral (12/21/2020 3:08 PM EDT) Procedure Note  Wilford Corner, MD - 12/21/2020   Formatting of this note might be different from the original. XR CHEST PA AND LATERAL  HISTORY: cough, dyspnea  TECHNIQUE: Frontal and lateral views of the chest were obtained.  COMPARISON: None  available.  FINDINGS: Right chest Port-A-Cath. The cardiomediastinal silhouette is unremarkable. The lungs are clear. No effusion or pneumothorax. Mild thoracic spondylosis.  IMPRESSION No acute cardiopulmonary process.  Wilford Corner, MD 12/21/2020 3:08 PM   No oxygen saturation available.  Patient  denies any fever, body aches,chills, rash, chest pain, nausea, vomiting, or diarrhea.  Denies dizziness, lightheadedness, pre syncopal or syncopal episodes.   Observations/Objective:   Patient is alert and oriented and responsive to questions Engages in conversation with provider. Speaks in full sentences without any pauses without any shortness of breath or distress. He is coughing on the phone, and has a bronchial sounding cough.    Assessment and Plan:  1. Bacterial upper respiratory infection Start as below after calling oncologist today with treatment plan.  - levofloxacin (LEVAQUIN) 750 MG tablet; Take 1 tablet (750 mg total) by mouth daily.  Dispense: 7 tablet; Refill: 0  2. Cough Suspect bacterial at this point, x ray did not show pneumonia on 12/21/20. - albuterol (VENTOLIN HFA) 108 (90 Base) MCG/ACT inhaler; Inhale 2 puffs into the lungs every 6 (six) hours as needed for wheezing or shortness of breath.  Dispense: 8 g; Refill: 0 - benzonatate (TESSALON) 100 MG capsule; Take 1 capsule (100 mg total) by mouth 2 (two) times daily as needed for cough.  Dispense: 20 capsule; Refill: 0 - fluticasone-salmeterol (ADVAIR) 100-50 MCG/ACT AEPB; Inhale 1 puff into the lungs 2 (two) times daily. Rinse mouth out after each use, swish with water and spit out after each use.  Dispense: 1 each; Refill: 0  3. Immunocompromised ([place]) Will verify with his oncologist before taking Levaquin.   4. Rectal cancer metastasized to liver [facility]) Noted for consistent history.   5. Bronchitis Has been using an expired albuterol, seems to help some will send Advair inhaler.   - albuterol (VENTOLIN HFA)  108 (90 Base) MCG/ACT inhaler; Inhale 2 puffs into the lungs every 6 (six) hours as needed for wheezing or shortness of breath.  Dispense: 8 g; Refill: 0 - fluticasone-salmeterol (ADVAIR) 100-50 MCG/ACT AEPB; Inhale 1 puff into the lungs 2 (two) times daily. Rinse mouth out after each use, swish with water and spit out after each use.  Dispense: 1 each; Refill: 0   Labs declined as he is going for labs at oncology on 12/24/2020.  Follow Up Instructions:  Return in about 3 days (around 12/26/2020), or if symptoms worsen or fail to improve, for at any time for any worsening symptoms, Go to Emergency room/ urgent care if worse.   Advised in person evaluation at anytime is advised if any symptoms do not improve, worsen or change at any given time.  Red Flags discussed. The patient was given clear instructions to go to ER or return to medical center if any red flags develop, symptoms do not improve, worsen or new problems develop. They verbalized understanding.    I discussed the assessment and treatment plan with the patient. The patient was provided an opportunity to ask questions and all were answered. The patient agreed with the plan and demonstrated an understanding of the instructions.   The patient was advised to call back or seek an in-person evaluation if the symptoms worsen or if the condition fails to improve as anticipated.  I provided 24 minutes of non-face-to-face time during this encounter.   Marcille Buffy, FNP

## 2020-12-23 NOTE — Telephone Encounter (Signed)
Seen by [facility]

## 2020-12-24 ENCOUNTER — Inpatient Hospital Stay: Payer: Managed Care, Other (non HMO) | Attending: Oncology

## 2020-12-24 ENCOUNTER — Inpatient Hospital Stay (HOSPITAL_BASED_OUTPATIENT_CLINIC_OR_DEPARTMENT_OTHER): Payer: Managed Care, Other (non HMO) | Admitting: Oncology

## 2020-12-24 ENCOUNTER — Encounter: Payer: Self-pay | Admitting: Oncology

## 2020-12-24 ENCOUNTER — Inpatient Hospital Stay: Payer: Managed Care, Other (non HMO)

## 2020-12-24 VITALS — BP 150/72 | HR 96 | Temp 97.3°F | Resp 16

## 2020-12-24 VITALS — BP 94/66 | HR 106 | Temp 99.6°F | Resp 18 | Wt 243.4 lb

## 2020-12-24 DIAGNOSIS — Z7982 Long term (current) use of aspirin: Secondary | ICD-10-CM | POA: Diagnosis not present

## 2020-12-24 DIAGNOSIS — C20 Malignant neoplasm of rectum: Secondary | ICD-10-CM

## 2020-12-24 DIAGNOSIS — Z800 Family history of malignant neoplasm of digestive organs: Secondary | ICD-10-CM | POA: Diagnosis not present

## 2020-12-24 DIAGNOSIS — C787 Secondary malignant neoplasm of liver and intrahepatic bile duct: Secondary | ICD-10-CM | POA: Insufficient documentation

## 2020-12-24 DIAGNOSIS — Z7951 Long term (current) use of inhaled steroids: Secondary | ICD-10-CM | POA: Diagnosis not present

## 2020-12-24 DIAGNOSIS — E1365 Other specified diabetes mellitus with hyperglycemia: Secondary | ICD-10-CM | POA: Diagnosis not present

## 2020-12-24 DIAGNOSIS — Z5111 Encounter for antineoplastic chemotherapy: Secondary | ICD-10-CM

## 2020-12-24 DIAGNOSIS — Z79899 Other long term (current) drug therapy: Secondary | ICD-10-CM | POA: Diagnosis not present

## 2020-12-24 DIAGNOSIS — C7A8 Other malignant neuroendocrine tumors: Secondary | ICD-10-CM

## 2020-12-24 DIAGNOSIS — C7A098 Malignant carcinoid tumors of other sites: Secondary | ICD-10-CM | POA: Diagnosis not present

## 2020-12-24 DIAGNOSIS — Z7189 Other specified counseling: Secondary | ICD-10-CM

## 2020-12-24 DIAGNOSIS — I10 Essential (primary) hypertension: Secondary | ICD-10-CM | POA: Insufficient documentation

## 2020-12-24 DIAGNOSIS — C7951 Secondary malignant neoplasm of bone: Secondary | ICD-10-CM

## 2020-12-24 DIAGNOSIS — T451X5A Adverse effect of antineoplastic and immunosuppressive drugs, initial encounter: Secondary | ICD-10-CM

## 2020-12-24 DIAGNOSIS — E871 Hypo-osmolality and hyponatremia: Secondary | ICD-10-CM

## 2020-12-24 DIAGNOSIS — Z794 Long term (current) use of insulin: Secondary | ICD-10-CM | POA: Diagnosis not present

## 2020-12-24 DIAGNOSIS — E876 Hypokalemia: Secondary | ICD-10-CM | POA: Diagnosis not present

## 2020-12-24 DIAGNOSIS — Z8249 Family history of ischemic heart disease and other diseases of the circulatory system: Secondary | ICD-10-CM | POA: Insufficient documentation

## 2020-12-24 DIAGNOSIS — G620 Drug-induced polyneuropathy: Secondary | ICD-10-CM

## 2020-12-24 DIAGNOSIS — Z5112 Encounter for antineoplastic immunotherapy: Secondary | ICD-10-CM | POA: Insufficient documentation

## 2020-12-24 DIAGNOSIS — E1165 Type 2 diabetes mellitus with hyperglycemia: Secondary | ICD-10-CM | POA: Insufficient documentation

## 2020-12-24 LAB — COMPREHENSIVE METABOLIC PANEL
ALT: 37 U/L (ref 0–44)
AST: 51 U/L — ABNORMAL HIGH (ref 15–41)
Albumin: 3.2 g/dL — ABNORMAL LOW (ref 3.5–5.0)
Alkaline Phosphatase: 166 U/L — ABNORMAL HIGH (ref 38–126)
Anion gap: 11 (ref 5–15)
BUN: 16 mg/dL (ref 6–20)
CO2: 28 mmol/L (ref 22–32)
Calcium: 9.1 mg/dL (ref 8.9–10.3)
Chloride: 90 mmol/L — ABNORMAL LOW (ref 98–111)
Creatinine, Ser: 0.79 mg/dL (ref 0.61–1.24)
GFR, Estimated: >60 mL/min (ref >60)
Glucose, Bld: 272 mg/dL — ABNORMAL HIGH (ref 70–99)
Potassium: 3.8 mmol/L (ref 3.5–5.1)
Sodium: 129 mmol/L — ABNORMAL LOW (ref 135–145)
Total Bilirubin: 0.6 mg/dL (ref 0.3–1.2)
Total Protein: 7.5 g/dL (ref 6.5–8.1)

## 2020-12-24 LAB — CBC WITH DIFFERENTIAL/PLATELET
Abs Immature Granulocytes: 0.13 10*3/uL — ABNORMAL HIGH (ref 0.00–0.07)
Basophils Absolute: 0.1 10*3/uL (ref 0.0–0.1)
Basophils Relative: 0 %
Eosinophils Absolute: 0 10*3/uL (ref 0.0–0.5)
Eosinophils Relative: 0 %
HCT: 37.8 % — ABNORMAL LOW (ref 39.0–52.0)
Hemoglobin: 12.8 g/dL — ABNORMAL LOW (ref 13.0–17.0)
Immature Granulocytes: 1 %
Lymphocytes Relative: 12 %
Lymphs Abs: 2.2 10*3/uL (ref 0.7–4.0)
MCH: 29 pg (ref 26.0–34.0)
MCHC: 33.9 g/dL (ref 30.0–36.0)
MCV: 85.5 fL (ref 80.0–100.0)
Monocytes Absolute: 1.4 10*3/uL — ABNORMAL HIGH (ref 0.1–1.0)
Monocytes Relative: 8 %
Neutro Abs: 14.1 10*3/uL — ABNORMAL HIGH (ref 1.7–7.7)
Neutrophils Relative %: 79 %
Platelets: 350 10*3/uL (ref 150–400)
RBC: 4.42 MIL/uL (ref 4.22–5.81)
RDW: 13.5 % (ref 11.5–15.5)
WBC: 18 10*3/uL — ABNORMAL HIGH (ref 4.0–10.5)
nRBC: 0 % (ref 0.0–0.2)

## 2020-12-24 LAB — PROTEIN, URINE, RANDOM: Total Protein, Urine: 14 mg/dL

## 2020-12-24 MED ORDER — SODIUM CHLORIDE 0.9 % IV SOLN
391.0000 mg/m2 | Freq: Once | INTRAVENOUS | Status: AC
  Administered 2020-12-24: 900 mg via INTRAVENOUS
  Filled 2020-12-24: qty 25

## 2020-12-24 MED ORDER — SODIUM CHLORIDE 0.9 % IV SOLN
Freq: Once | INTRAVENOUS | Status: AC
  Filled 2020-12-24: qty 250

## 2020-12-24 MED ORDER — SODIUM CHLORIDE 0.9 % IV SOLN
600.0000 mg | Freq: Once | INTRAVENOUS | Status: AC
  Administered 2020-12-24: 600 mg via INTRAVENOUS
  Filled 2020-12-24: qty 16

## 2020-12-24 MED ORDER — FLUOROURACIL CHEMO INJECTION 2.5 GM/50ML
400.0000 mg/m2 | Freq: Once | INTRAVENOUS | Status: AC
  Administered 2020-12-24: 900 mg via INTRAVENOUS
  Filled 2020-12-24: qty 18

## 2020-12-24 MED ORDER — SODIUM CHLORIDE 0.9 % IV SOLN
10.0000 mg | Freq: Once | INTRAVENOUS | Status: AC
  Administered 2020-12-24: 10 mg via INTRAVENOUS
  Filled 2020-12-24: qty 10

## 2020-12-24 MED ORDER — PALONOSETRON HCL INJECTION 0.25 MG/5ML
0.2500 mg | Freq: Once | INTRAVENOUS | Status: AC
Start: 2020-12-24 — End: 2020-12-24
  Administered 2020-12-24: 0.25 mg via INTRAVENOUS
  Filled 2020-12-24: qty 5

## 2020-12-24 MED ORDER — SODIUM CHLORIDE 0.9 % IV SOLN
150.0000 mg/m2 | Freq: Once | INTRAVENOUS | Status: AC
  Administered 2020-12-24: 340 mg via INTRAVENOUS
  Filled 2020-12-24: qty 15

## 2020-12-24 MED ORDER — ATROPINE SULFATE 1 MG/ML IJ SOLN
0.5000 mg | Freq: Once | INTRAMUSCULAR | Status: AC
  Administered 2020-12-24: 0.5 mg via INTRAVENOUS
  Filled 2020-12-24: qty 1

## 2020-12-24 MED ORDER — SODIUM CHLORIDE 0.9% FLUSH
10.0000 mL | INTRAVENOUS | Status: DC | PRN
  Filled 2020-12-24: qty 10

## 2020-12-24 MED ORDER — SODIUM CHLORIDE 0.9 % IV SOLN
2400.0000 mg/m2 | INTRAVENOUS | Status: DC
  Administered 2020-12-24: 5500 mg via INTRAVENOUS
  Filled 2020-12-24: qty 110

## 2020-12-24 NOTE — Telephone Encounter (Signed)
Dr. Joycie Peek office called patient to move up & he declined.

## 2020-12-24 NOTE — Progress Notes (Signed)
Pt here for follow up. Pt reports that he completed 5 days of amoxicillin for pneumonia. Pt was prescribed Levofloxacin yesterday, but would like to get ok from Dr. Tasia Catchings.

## 2020-12-24 NOTE — Progress Notes (Signed)
Patient to receive 1 liter NS then recheck VS prior to proceeding with treatment. VS stable BP (!) 150/72 (BP Location: Left Arm, Patient Position: Sitting)   Pulse 96   Temp (!) 97.3 F (36.3 C) (Tympanic)   Resp 16   SpO2 99%  and patient feels up to proceeding with chemotherapy today. Dr. Tasia Catchings notified and OK to proceed with treatment.

## 2020-12-24 NOTE — Telephone Encounter (Signed)
noted 

## 2020-12-24 NOTE — Progress Notes (Signed)
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  #reports symptoms of upper abdomen band like discomfort and bloating for a few months, improved symptoms with omeprazole,.Chronic constipation, he has blood in the stool occationally Seen by PCP in September 2021. AlK phosphatase was recently noted to be elevated as well well elevated GGT. He was referred to see gastroenteralgias on 04/10/2020.  04/25/2020 EGD showed gastric fundus mild inflammation. Biopsy showed reactive gastropathy.  04/30/2020 [place] RUQ showed cirrhosis with multiple hepatic masses.  05/01/2020 AFP 1.7 05/02/2020 MR liver w wo contrast showed numerous heterozygous enhancing liver lesions, throughout the liver, not typical for [facility], likely metastatic disease.  Solid appearing enhancing lesion in pancreatic tail 1.9x1.5cm, suspicious for primary neoplasm.  Mild adenopathy within porta hepatic region and porta hepatic region. Splenomegaly.   Patient has history of SVT, psoriatic arthritis, morbid obesity, GERD, DM.  Family history + maternal uncle with esophageal cancer.   + unintentional weight loss, no fever, chills. "sweats a lot". Currently on antibiotics for proctitis.  He  works at [employer], lives in [place]. He lives with his partner.    # 05/17/2020, colonoscopy showed a fungating and infiltrative partially obstructing large mass in the proximal rectum and in the mid rectum.  The mass was partially circumferential, involving two thirds of the lumen circumference is.  Biopsy showed adenocarcinoma.  05/23/2020, patient underwent EUS biopsy of the pancreatic tail lesion.  Biopsy pathology showed well-differentiated neuroendocrine tumor.  Ki-67 is noncontributory due to insufficient tissue in the cell block.  # Genetic testing is negative.  #06/03/2020  Start FOLFOX and bevacizumab. #09/18/2020 Infusion reaction to oxaliplatin during cycle 8 FOLFOX bevacizumab. # 10/02/2020 FOLFIRI +Bevacitzumab Patient has had intermittent SVT symptoms.  He is overdue for his appointment with cardiology.  INTERVAL HISTORY Dominic [name] is a 50 y.o. 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 Problems and complaints are listed below: Palliative chemotherapy FOLFIRI with bevacizumab was held due to acute upper respiratory infection. Patient has developed cough, sore throat postnasal drip, runny nose left ear fullness.  COVID-19 negative 12/16/2020, patient was seen by urgent care and was diagnosed with pneumonia prescribed amoxicillin for 5 days which he completed.  I do not see any x-ray was obtained at that point. 12/21/2020, patient continues to have symptoms and was seen by urgent care.  X-ray was obtained on 12/21/2020 which showed lungs are clear, no effusion or pneumothorax.  Patient was prescribed Levaquin and tapering course of steroid.  Patient took a few days of steroid.  He has not started on Levaquin yet.  Denies any fever.  He feels better today.  Cough has improved but not completely resolved yet.     Review of Systems  Constitutional:  Positive  for fatigue.  Negative for appetite change, chills, fever and unexpected weight change.  HENT:   Negative for hearing loss and voice change.   Eyes:  Negative for eye problems and icterus.  Respiratory:  Negative for chest tightness, cough and shortness of breath.   Cardiovascular:  Negative for chest pain, leg swelling and palpitations.  Gastrointestinal:  Negative for abdominal distention and nausea.       Burping  Endocrine: Negative for hot flashes.  Genitourinary:  Negative for difficulty urinating, dysuria and frequency.   Musculoskeletal:  Negative for arthralgias.  Skin:  Negative for itching and rash.  Neurological:  Positive for numbness. Negative for light-headedness.  Hematological:  Negative for adenopathy. Does not bruise/bleed easily.  Psychiatric/Behavioral:  Negative for confusion.    MEDICAL HISTORY:  Past Medical History:  Diagnosis Date   Anxiety    Cancer [name])    Rectal cancer metastasized to liver ([place])   Diabetes mellitus without complication (HCC)    Dysrhythmia    svt   Family history of esophageal cancer    Family history of prostate cancer    GERD (gastroesophageal reflux disease)    High triglycerides    Hypertension    Long-term insulin use in type 2 diabetes ([place])    Morbid obesity (HCC)    Psoriatic arthritis ([place])    SVT (supraventricular tachycardia) ([place])     SURGICAL HISTORY: Past Surgical History:  Procedure Laterality Date   EUS N/A 05/23/2020   Procedure: FULL UPPER ENDOSCOPIC ULTRASOUND (EUS) RADIAL;  Surgeon: Dominic Schmidt, MD;  Location: [facility] ENDOSCOPY;  Service: Endoscopy;  Laterality: N/A;   PORTA CATH INSERTION N/A 05/30/2020   Procedure: PORTA CATH INSERTION;  Surgeon: Dominic Huxley, MD;  Location: [facility] CV LAB;  Service: Cardiovascular;  Laterality: N/A;   UPPER GASTROINTESTINAL ENDOSCOPY      SOCIAL HISTORY: Social History   Socioeconomic History   Marital status: Soil scientist    Spouse name: [name]   Number of children:  Not on file   Years of education: Not on file   Highest education level: Not on file  Occupational History   Not on file  Tobacco Use   Smoking status: Never   Smokeless tobacco: Never  Vaping Use   Vaping Use: Never used  Substance and Sexual Activity   Alcohol use: Yes    Alcohol/week: 1.0 standard drink    Types: 1 Glasses of wine per week    Comment: very occasional   Drug use: Never   Sexual activity: Not on file  Other Topics Concern   Not on file  Social History Narrative   Lives in [place] partner [place].  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     Social Determinants of Health   Financial Resource Strain: Medium Risk   Difficulty of Paying Living Expenses: Somewhat hard  Food Insecurity: Not on file  Transportation Needs: Not on file  Physical Activity: Not on file  Stress: Not on file  Social Connections: Not on file  Intimate Partner Violence: Not on file    FAMILY HISTORY: Family History  Problem Relation Age of Onset   Arthritis Mother    Heart disease Mother    Supraventricular tachycardia Mother        s/p ablation   Hypertension Mother    Diabetes Father    Heart attack Maternal Uncle    Throat cancer Maternal Uncle    Esophageal cancer Maternal Uncle    Heart attack Maternal Uncle  Heart attack Maternal Uncle    Prostate cancer Maternal Uncle    Thyroid cancer Neg Hx    Colon cancer Neg Hx    Rectal cancer Neg Hx    Stomach cancer Neg Hx    Colon polyps Neg Hx     ALLERGIES:  has No Known Allergies.  MEDICATIONS:  Current Outpatient Medications  Medication Sig Dispense Refill   albuterol (VENTOLIN HFA) 108 (90 Base) MCG/ACT inhaler Inhale 2 puffs into the lungs every 6 (six) hours as needed for wheezing or shortness of breath. 8 g 0   ALPHA LIPOIC ACID PO Take 1 capsule by mouth daily.     ALPRAZolam (XANAX) 0.5 MG tablet Take half tablet to one tablet by mouth as needed daily for insomnia, anxiety. 30 tablet 1   amLODipine (NORVASC) 10 MG tablet  TAKE 1 TABLET BY MOUTH  DAILY 90 tablet 3   aspirin EC 81 MG tablet Take 1 tablet (81 mg total) by mouth daily. Swallow whole. 30 tablet 11   B Complex Vitamins (VITAMIN B COMPLEX PO) Take 1 tablet by mouth daily.     benzonatate (TESSALON) 100 MG capsule Take 1 capsule (100 mg total) by mouth 2 (two) times daily as needed for cough. 20 capsule 0   Continuous Blood Gluc Sensor (FREESTYLE LIBRE 2 SENSOR) MISC Scan glucose at least QID 2 each 11   CONTOUR NEXT TEST test strip USE 1 STRIP TO TEST BLOOD  SUGAR TWICE DAILY 200 strip 3   gabapentin (NEURONTIN) 100 MG capsule Take 2 capsules (200 mg total) by mouth 3 (three) times daily. 540 capsule 0   hydrochlorothiazide (HYDRODIURIL) 25 MG tablet Take 25 mg by mouth daily.     insulin glargine (LANTUS SOLOSTAR) 100 UNIT/ML Solostar Pen Inject 56 Units into the skin daily. (Patient taking differently: Inject 58 Units into the skin daily.) 51 mL 3   insulin lispro (HUMALOG KWIKPEN) 100 UNIT/ML KwikPen Inject 18 Units into the skin 3 (three) times daily. Take with meals. Hold if premeal glucose <100 45 mL 5   Insulin Pen Needle (PEN NEEDLES) 32G X 6 MM MISC Used to give insulin injections 4 times daily. 100 each 10   lidocaine-prilocaine (EMLA) cream Apply to affected area once 30 g 3   loperamide (IMODIUM A-D) 2 MG tablet Take 1 tablet (2 mg total) by mouth See admin instructions. Take 2 tabs at diarrhea onset , then 1 tab after every loose bowel movement. Maximum 8 tablets within 24 hour period of time 100 tablet 1   [name] Take 5 mg by mouth at bedtime as needed.     metFORMIN (GLUCOPHAGE XR) 500 MG 24 hr tablet Take 4 tablets (2,000 mg total) by mouth every evening. 120 tablet 3   metoprolol succinate (TOPROL-XL) 100 MG 24 hr tablet TAKE 1 TABLET BY MOUTH  DAILY WITH OR IMMEDIATELY  FOLLOWING A MEAL 90 tablet 3   Microlet Lancets MISC Used to check blood sugars three times a day. 200 each 5   Omega-3 Fatty Acids (FISH OIL PO) Take 800 mg by mouth  daily.     omeprazole (PRILOSEC) 20 MG capsule Take 1 capsule (20 mg total) by mouth in the morning and at bedtime. 180 capsule 0   ondansetron (ZOFRAN) 8 MG tablet Take 1 tablet (8 mg total) by mouth 2 (two) times daily as needed for refractory nausea / vomiting. Start on day 3 after chemotherapy. 30 tablet 1   potassium chloride (KLOR-CON) 10  MEQ tablet Take 1 tablet (10 mEq total) by mouth daily. 30 tablet 0   telmisartan (MICARDIS) 40 MG tablet Take 1 tablet (40 mg total) by mouth daily. PLEASE CALL OFFICE TO SCHEDULE FOLLOW UP APPOINTMENT FOR REFILLS. 90 tablet 1   zinc gluconate 50 MG tablet Take 50 mg by mouth daily.     fluticasone-salmeterol (ADVAIR) 100-50 MCG/ACT AEPB Inhale 1 puff into the lungs 2 (two) times daily. Rinse mouth out after each use, swish with water and spit out after each use. (Patient not taking: Reported on 12/24/2020) 1 each 0   lactulose (CHRONULAC) 10 GM/15ML solution Take 15 mLs (10 g total) by mouth 2 (two) times daily as needed for moderate constipation or severe constipation. (Patient not taking: Reported on 12/24/2020) 236 mL 0   levofloxacin (LEVAQUIN) 750 MG tablet Take 1 tablet (750 mg total) by mouth daily. (Patient not taking: Reported on 12/24/2020) 7 tablet 0   magic mouthwash w/lidocaine SOLN Take 5 mLs by mouth 4 (four) times daily as needed for mouth pain. 80 ml viscous lidocaine 2%, 80 ml Mylanta, 80 ml Diphenhydramine 12.5 mg/5 ml Elixir, 80 ml Nystatin 100,000 Unit suspension, 80 ml Prednisolone 15 mg/2m, 80 ml Distilled Water.  Sig: Swish/Swallow 5-10 ml four times a day as needed. (Patient not taking: Reported on 12/24/2020) 480 mL 3   ondansetron (ZOFRAN ODT) 4 MG disintegrating tablet Take 1 tablet (4 mg total) by mouth every 8 (eight) hours as needed for nausea or vomiting. (Patient not taking: Reported on 12/24/2020) 30 tablet 2   polyethylene glycol powder (GLYCOLAX/MIRALAX) 17 GM/SCOOP powder Take by mouth daily. 1/2 capful QD (Patient not taking:  Reported on 12/24/2020)     prochlorperazine (COMPAZINE) 10 MG tablet Take 1 tablet (10 mg total) by mouth every 6 (six) hours as needed (Nausea or vomiting). (Patient not taking: Reported on 12/24/2020) 30 tablet 1   senna (SENOKOT) 8.6 MG TABS tablet Take 2 tablets (17.2 mg total) by mouth daily. (Patient not taking: Reported on 12/24/2020) 120 tablet 0   tamsulosin (FLOMAX) 0.4 MG CAPS capsule Take 1 capsule (0.4 mg total) by mouth daily. (Patient not taking: Reported on 12/24/2020) 30 capsule 1   Current Facility-Administered Medications  Medication Dose Route Frequency Provider Last Rate Last Admin   0.9 %  sodium chloride infusion  500 mL Intravenous Once DDoran Stabler MD       Facility-Administered Medications Ordered in Other Visits  Medication Dose Route Frequency Provider Last Rate Last Admin   atropine injection 0.5 mg  0.5 mg Intravenous Once YEarlie Server MD       fluorouracil (ADRUCIL) 5,500 mg in sodium chloride 0.9 % 140 mL chemo infusion  2,400 mg/m2 (Treatment Plan Recorded) Intravenous 1 day or 1 dose YEarlie Server MD       fluorouracil (ADRUCIL) chemo injection 900 mg  400 mg/m2 (Treatment Plan Recorded) Intravenous Once YEarlie Server MD       irinotecan (CAMPTOSAR) 340 mg in sodium chloride 0.9 % 500 mL chemo infusion  150 mg/m2 (Treatment Plan Recorded) Intravenous Once YEarlie Server MD       leucovorin 900 mg in sodium chloride 0.9 % 250 mL infusion  391 mg/m2 (Treatment Plan Recorded) Intravenous Once YEarlie Server MD       sodium chloride flush (NS) 0.9 % injection 10 mL  10 mL Intracatheter PRN YEarlie Server MD         PHYSICAL EXAMINATION: ECOG PERFORMANCE STATUS: 1 - Symptomatic but  completely ambulatory Vitals:   12/24/20 0958  BP: 94/66  Pulse: (!) 106  Resp: 18  Temp: 99.6 F (37.6 C)   Filed Weights   12/24/20 0958  Weight: 243 lb 6.4 oz (110.4 kg)    Physical Exam Constitutional:      General: He is not in acute distress. HENT:     Head: Normocephalic and atraumatic.   Eyes:     General: No scleral icterus. Cardiovascular:     Rate and Rhythm: Normal rate and regular rhythm.     Heart sounds: Normal heart sounds.  Pulmonary:     Effort: Pulmonary effort is normal. No respiratory distress.     Breath sounds: No wheezing.  Abdominal:     General: Bowel sounds are normal. There is no distension.     Palpations: Abdomen is soft.  Musculoskeletal:        General: No deformity. Normal range of motion.     Cervical back: Normal range of motion and neck supple.  Skin:    General: Skin is warm and dry.     Findings: No erythema or rash.  Neurological:     Mental Status: He is alert and oriented to person, place, and time. Mental status is at baseline.     Cranial Nerves: No cranial nerve deficit.     Coordination: Coordination normal.  Psychiatric:        Mood and Affect: Mood normal.    LABORATORY DATA:  I have reviewed the data as listed Lab Results  Component Value Date   WBC 18.0 (H) 12/24/2020   HGB 12.8 (L) 12/24/2020   HCT 37.8 (L) 12/24/2020   MCV 85.5 12/24/2020   PLT 350 12/24/2020   Recent Labs    04/10/20 1021 04/25/20 0942 05/07/20 0933 06/03/20 0815 06/21/20 0908 06/28/20 1019 11/13/20 0803 11/27/20 0907 12/24/20 0935  NA  --   --  136   < > 135   < > 137 134* 129*  K  --   --  4.4   < > 4.8   < > 3.9 3.8 3.8  CL  --   --  98   < > 95*   < > 98 99 90*  CO2  --   --  21   < > 22   < > _0 GLUCOSE  --   --  175*   < > 202*   < > 275* 175* 272*  BUN 18  --  12   < > 11   < > _1 CREATININE 0.91  --  0.86   < > 0.80   < > 0.80 0.89 0.79  CALCIUM  --   --  9.2   < > 9.2   < > 9.2 8.8* 9.1  GFRNONAA 98  --  101   < > 104   < > >60 >60 >60  GFRAA 113  --  117  --  120  --   --   --   --   PROT  --  6.5  --    < >  --    < > 7.3 7.3 7.5  ALBUMIN  --  3.1*  --    < >  --    < > 3.7 3.7 3.2*  AST  --  18  --    < >  --    < > 32 26 51*  ALT  --  17  --    < >  --    < >  21 19 37  ALKPHOS  --  215*  --    < >  --     < > 89 91 166*  BILITOT  --  0.7  --    < >  --    < > 0.6 0.9 0.6  BILIDIR  --  0.47*  --   --   --   --   --   --   --    < > = values in this interval not displayed.    Iron/TIBC/Ferritin/ %Sat No results found for: IRON, TIBC, FERRITIN, IRONPCTSAT    RADIOGRAPHIC STUDIES: I have personally reviewed the radiological images as listed and agreed with the findings in the report. No results found.    ASSESSMENT & PLAN:  1. Rectal cancer metastasized to liver ([place])   2. Encounter for antineoplastic chemotherapy   3. Neuroendocrine carcinoma of pancreas ([place])   4. Neuropathy due to chemotherapeutic drug ([place])   5. Uncontrolled other specified diabetes mellitus with hyperglycemia ([place])   6. Hypokalemia   7. Hyponatremia   Cancer Staging Neuroendocrine carcinoma of pancreas ([place]) Staging form: Exocrine Pancreas, AJCC 8th Edition - Clinical: Stage IB (cT2, cN0, cM0) - Signed by Dominic Server, MD on 09/11/2020  Rectal cancer metastasized to liver [facility]) Staging form: Colon and Rectum, AJCC 8th Edition - Clinical stage from 06/03/2020: Stage Unknown (cTX, cNX, pM1) - Signed by Dominic Server, MD on 06/03/2020   #Stage IV rectal cancer  first-line palliative chemotherapy with FOLFOX and bevacizumab.-Infusion reaction to oxaliplatin during cycle 8 FOLFOX bevacizumab. Currently on 2nd line treatment FOLFIRI with bevacizumab Labs reviewed and discussed with patient. Proceed with FOLFIRI with bevacizumab today.  Irinotecan 150 mg/m2 CEA fluctuates.  Have not observed a significant downward trend.  Today's CEA is pending. He has second opinion appointment with [facility] From postpone her CT scan for 2 weeks due to recent URI  #Pancreatic well differentiated neuroendocrine carcinoma Dotatate PET scan was reviewed and discussed with patient.  Lesion has grown in size now 3cm.  He is asymptomatic from neuroendocrine carcinoma at this point.  I will hold off starting lanreotide. May consider  in the future if he develops progressive symptoms  #Hyponatremia, likely due to decreased oral intake and hyperglycemia.  Patient will receive 1 L of normal saline x1 today.  #GERD, discontinue famotidine.  Switch to omeprazole 20 mg twice daily. # Chemotherapy induced neuropathy. Grade 1  He reports taking gabapentin 100 mg twice daily.  Recommend patient to increase to 200 mg 2-3 times daily. # Hypokalemia, K 3.8 , continue potassium chloride 10 mEq daily.   # Uncontrolled diabetes,  continue follow-up with primary care provider for glycemic control.  Cozaar elevated due to recent steroid. #Episode of SVT.  Recommend patient to follow-up with cardiology.  All questions were answered. The patient knows to call the clinic with any problems questions or concerns.  cc Dominic Hawthorne, FNP    Return of visit: 2 weeks  Dominic Server, MD, PhD Hematology Oncology [name] at [facility] Pager- [phone] 12/24/2020

## 2020-12-24 NOTE — Patient Instructions (Signed)
[facility]  Discharge Instructions: Thank you for choosing [facility] to provide your oncology and hematology care.  If you have a lab appointment with the [facility], please go directly to the [facility] and check in at the registration area.  Wear comfortable clothing and clothing appropriate for easy access to any Portacath or PICC line.   We strive to give you quality time with your provider. You may need to reschedule your appointment if you arrive late (15 or more minutes).  Arriving late affects you and other patients whose appointments are after yours.  Also, if you miss three or more appointments without notifying the office, you may be dismissed from the clinic at the provider's discretion.      For prescription refill requests, have your pharmacy contact our office and allow 72 hours for refills to be completed.    Today you received the following chemotherapy and/or immunotherapy agents - irinotecan, bevacizumab, fluorouracil      To help prevent nausea and vomiting after your treatment, we encourage you to take your nausea medication as directed.  BELOW ARE SYMPTOMS THAT SHOULD BE REPORTED IMMEDIATELY: *FEVER GREATER THAN 100.4 F (38 C) OR HIGHER *CHILLS OR SWEATING *NAUSEA AND VOMITING THAT IS NOT CONTROLLED WITH YOUR NAUSEA MEDICATION *UNUSUAL SHORTNESS OF BREATH *UNUSUAL BRUISING OR BLEEDING *URINARY PROBLEMS (pain or burning when urinating, or frequent urination) *BOWEL PROBLEMS (unusual diarrhea, constipation, pain near the anus) TENDERNESS IN MOUTH AND THROAT WITH OR WITHOUT PRESENCE OF ULCERS (sore throat, sores in mouth, or a toothache) UNUSUAL RASH, SWELLING OR PAIN  UNUSUAL VAGINAL DISCHARGE OR ITCHING   Items with * indicate a potential emergency and should be followed up as soon as possible or go to the Emergency Department if any problems should occur.  Please show the CHEMOTHERAPY ALERT CARD or  IMMUNOTHERAPY ALERT CARD at check-in to the Emergency Department and triage nurse.  Should you have questions after your visit or need to cancel or reschedule your appointment, please contact [facility]  [phone] and follow the prompts.  Office hours are 8:00 a.m. to 4:30 p.m. Monday - Friday. Please note that voicemails left after 4:00 p.m. may not be returned until the following business day.  We are closed weekends and major holidays. You have access to a nurse at all times for urgent questions. Please call the main number to the clinic [phone] and follow the prompts.  For any non-urgent questions, you may also contact your provider using MyChart. We now offer e-Visits for anyone 36 and older to request care online for non-urgent symptoms. For details visit [email or web address].   Also download the MyChart app! Go to the app store, search "MyChart", open the app, select [facility], and log in with your MyChart username and password.  Due to Covid, a mask is required upon entering the hospital/clinic. If you do not have a mask, one will be given to you upon arrival. For doctor visits, patients may have 1 support person aged 27 or older with them. For treatment visits, patients cannot have anyone with them due to current Covid guidelines and our immunocompromised population.   Irinotecan injection What is this medication? IRINOTECAN (ir in oh TEE kan ) is a chemotherapy drug. It is used to treatcolon and rectal cancer. This medicine may be used for other purposes; ask your health care provider orpharmacist if you have questions. COMMON BRAND NAME(S): Camptosar What should I tell my  care team before I take this medication? They need to know if you have any of these conditions: dehydration diarrhea infection (especially a virus infection such as chickenpox, cold sores, or herpes) liver disease low blood counts, like low white cell, platelet, or  red cell counts low levels of calcium, magnesium, or potassium in the blood recent or ongoing radiation therapy an unusual or allergic reaction to irinotecan, other medicines, foods, dyes, or preservatives pregnant or trying to get pregnant breast-feeding How should I use this medication? This drug is given as an infusion into a vein. It is administered in a hospitalor clinic by a specially trained health care professional. Talk to your pediatrician regarding the use of this medicine in children.Special care may be needed. Overdosage: If you think you have taken too much of this medicine contact apoison control center or emergency room at once. NOTE: This medicine is only for you. Do not share this medicine with others. What if I miss a dose? It is important not to miss your dose. Call your doctor or health careprofessional if you are unable to keep an appointment. What may interact with this medication? Do not take this medicine with any of the following medications: cobicistat itraconazole This medicine may interact with the following medications: antiviral medicines for HIV or AIDS certain antibiotics like rifampin or rifabutin certain medicines for fungal infections like ketoconazole, posaconazole, and voriconazole certain medicines for seizures like carbamazepine, phenobarbital, phenotoin clarithromycin gemfibrozil nefazodone [facility] John's Wort This list may not describe all possible interactions. Give your health care provider a list of all the medicines, herbs, non-prescription drugs, or dietary supplements you use. Also tell them if you smoke, drink alcohol, or use illegaldrugs. Some items may interact with your medicine. What should I watch for while using this medication? Your condition will be monitored carefully while you are receiving this medicine. You will need important blood work done while you are taking thismedicine. This drug may make you feel generally unwell. This is  not uncommon, as chemotherapy can affect healthy cells as well as cancer cells. Report any side effects. Continue your course of treatment even though you feel ill unless yourdoctor tells you to stop. In some cases, you may be given additional medicines to help with side effects.Follow all directions for their use. You may get drowsy or dizzy. Do not drive, use machinery, or do anything that needs mental alertness until you know how this medicine affects you. Do not stand or sit up quickly, especially if you are an older patient. This reducesthe risk of dizzy or fainting spells. Call your health care professional for advice if you get a fever, chills, or sore throat, or other symptoms of a cold or flu. Do not treat yourself. This medicine decreases your body's ability to fight infections. Try to avoid beingaround people who are sick. Avoid taking products that contain aspirin, acetaminophen, ibuprofen, naproxen, or ketoprofen unless instructed by your doctor. These medicines may hide afever. This medicine may increase your risk to bruise or bleed. Call your doctor orhealth care professional if you notice any unusual bleeding. Be careful brushing and flossing your teeth or using a toothpick because you may get an infection or bleed more easily. If you have any dental work done,tell your dentist you are receiving this medicine. Do not become pregnant while taking this medicine or for 6 months after stopping it. Women should inform their health care professional if they wish to become pregnant or think they might be pregnant.  Men should not father a child while taking this medicine and for 3 months after stopping it. There is potential for serious side effects to an unborn child. Talk to your health careprofessional for more information. Do not breast-feed an infant while taking this medicine or for 7 days afterstopping it. This medicine has caused ovarian failure in some women. This medicine may make it more  difficult to get pregnant. Talk to your health care professional if [place] concerned about your fertility. This medicine has caused decreased sperm counts in some men. This may make it more difficult to father a child. Talk to your health care professional if [place] concerned about your fertility. What side effects may I notice from receiving this medication? Side effects that you should report to your doctor or health care professionalas soon as possible: allergic reactions like skin rash, itching or hives, swelling of the face, lips, or tongue chest pain diarrhea flushing, runny nose, sweating during infusion low blood counts - this medicine may decrease the number of white blood cells, red blood cells and platelets. You may be at increased risk for infections and bleeding. nausea, vomiting pain, swelling, warmth in the leg signs of decreased platelets or bleeding - bruising, pinpoint red spots on the skin, black, tarry stools, blood in the urine signs of infection - fever or chills, cough, sore throat, pain or difficulty passing urine signs of decreased red blood cells - unusually weak or tired, fainting spells, lightheadedness Side effects that usually do not require medical attention (report to yourdoctor or health care professional if they continue or are bothersome): constipation hair loss headache loss of appetite mouth sores stomach pain This list may not describe all possible side effects. Call your doctor for medical advice about side effects. You may report side effects to FDA at1-800-FDA-1088. Where should I keep my medication? This drug is given in a hospital or clinic and will not be stored at home. NOTE: This sheet is a summary. It may not cover all possible information. If you have questions about this medicine, talk to your doctor, pharmacist, orhealth care provider.  2022 Elsevier/Gold Standard (2019-05-30 17:46:13)  Fluorouracil, 5-FU injection What is this  medication? FLUOROURACIL, 5-FU (flure oh YOOR a sil) is a chemotherapy drug. It slows the growth of cancer cells. This medicine is used to treat many types of cancer like breast cancer, colon or rectal cancer, pancreatic cancer, and stomachcancer. This medicine may be used for other purposes; ask your health care provider orpharmacist if you have questions. COMMON BRAND NAME(S): Adrucil What should I tell my care team before I take this medication? They need to know if you have any of these conditions: blood disorders dihydropyrimidine dehydrogenase (DPD) deficiency infection (especially a virus infection such as chickenpox, cold sores, or herpes) kidney disease liver disease malnourished, poor nutrition recent or ongoing radiation therapy an unusual or allergic reaction to fluorouracil, other chemotherapy, other medicines, foods, dyes, or preservatives pregnant or trying to get pregnant breast-feeding How should I use this medication? This drug is given as an infusion or injection into a vein. It is administeredin a hospital or clinic by a specially trained health care professional. Talk to your pediatrician regarding the use of this medicine in children.Special care may be needed. Overdosage: If you think you have taken too much of this medicine contact apoison control center or emergency room at once. NOTE: This medicine is only for you. Do not share this medicine with others. What if I miss a  dose? It is important not to miss your dose. Call your doctor or health careprofessional if you are unable to keep an appointment. What may interact with this medication? Do not take this medicine with any of the following medications: live virus vaccines This medicine may also interact with the following medications: medicines that treat or prevent blood clots like warfarin, enoxaparin, and dalteparin This list may not describe all possible interactions. Give your health care provider a list of  all the medicines, herbs, non-prescription drugs, or dietary supplements you use. Also tell them if you smoke, drink alcohol, or use illegaldrugs. Some items may interact with your medicine. What should I watch for while using this medication? Visit your doctor for checks on your progress. This drug may make you feel generally unwell. This is not uncommon, as chemotherapy can affect healthy cells as well as cancer cells. Report any side effects. Continue your course oftreatment even though you feel ill unless your doctor tells you to stop. In some cases, you may be given additional medicines to help with side effects.Follow all directions for their use. Call your doctor or health care professional for advice if you get a fever, chills or sore throat, or other symptoms of a cold or flu. Do not treat yourself. This drug decreases your body's ability to fight infections. Try toavoid being around people who are sick. This medicine may increase your risk to bruise or bleed. Call your doctor orhealth care professional if you notice any unusual bleeding. Be careful brushing and flossing your teeth or using a toothpick because you may get an infection or bleed more easily. If you have any dental work done,tell your dentist you are receiving this medicine. Avoid taking products that contain aspirin, acetaminophen, ibuprofen, naproxen, or ketoprofen unless instructed by your doctor. These medicines may hide afever. Do not become pregnant while taking this medicine. Women should inform their doctor if they wish to become pregnant or think they might be pregnant. There is a potential for serious side effects to an unborn child. Talk to your health care professional or pharmacist for more information. Do not breast-feed aninfant while taking this medicine. Men should inform their doctor if they wish to father a child. This medicinemay lower sperm counts. Do not treat diarrhea with over the counter products. Contact  your doctor ifyou have diarrhea that lasts more than 2 days or if it is severe and watery. This medicine can make you more sensitive to the sun. Keep out of the sun. If you cannot avoid being in the sun, wear protective clothing and use sunscreen.Do not use sun lamps or tanning beds/booths. What side effects may I notice from receiving this medication? Side effects that you should report to your doctor or health care professionalas soon as possible: allergic reactions like skin rash, itching or hives, swelling of the face, lips, or tongue low blood counts - this medicine may decrease the number of white blood cells, red blood cells and platelets. You may be at increased risk for infections and bleeding. signs of infection - fever or chills, cough, sore throat, pain or difficulty passing urine signs of decreased platelets or bleeding - bruising, pinpoint red spots on the skin, black, tarry stools, blood in the urine signs of decreased red blood cells - unusually weak or tired, fainting spells, lightheadedness breathing problems changes in vision chest pain mouth sores nausea and vomiting pain, swelling, redness at site where injected pain, tingling, numbness in the hands or feet redness,  swelling, or sores on hands or feet stomach pain unusual bleeding Side effects that usually do not require medical attention (report to yourdoctor or health care professional if they continue or are bothersome): changes in finger or toe nails diarrhea dry or itchy skin hair loss headache loss of appetite sensitivity of eyes to the light stomach upset unusually teary eyes This list may not describe all possible side effects. Call your doctor for medical advice about side effects. You may report side effects to FDA at1-800-FDA-1088. Where should I keep my medication? This drug is given in a hospital or clinic and will not be stored at home. NOTE: This sheet is a summary. It may not cover all possible  information. If you have questions about this medicine, talk to your doctor, pharmacist, orhealth care provider.  2022 Elsevier/Gold Standard (2019-05-30 15:00:03)  Bevacizumab injection What is this medication? BEVACIZUMAB (be va SIZ yoo mab) is a monoclonal antibody. It is used to treatmany types of cancer. This medicine may be used for other purposes; ask your health care provider orpharmacist if you have questions. COMMON BRAND NAME(S): Avastin, MVASI, Noah Charon What should I tell my care team before I take this medication? They need to know if you have any of these conditions: diabetes heart disease high blood pressure history of coughing up blood prior anthracycline chemotherapy (e.g., doxorubicin, daunorubicin, epirubicin) recent or ongoing radiation therapy recent or planning to have surgery stroke an unusual or allergic reaction to bevacizumab, hamster proteins, mouse proteins, other medicines, foods, dyes, or preservatives pregnant or trying to get pregnant breast-feeding How should I use this medication? This medicine is for infusion into a vein. It is given by a health careprofessional in a hospital or clinic setting. Talk to your pediatrician regarding the use of this medicine in children.Special care may be needed. Overdosage: If you think you have taken too much of this medicine contact apoison control center or emergency room at once. NOTE: This medicine is only for you. Do not share this medicine with others. What if I miss a dose? It is important not to miss your dose. Call your doctor or health careprofessional if you are unable to keep an appointment. What may interact with this medication? Interactions are not expected. This list may not describe all possible interactions. Give your health care provider a list of all the medicines, herbs, non-prescription drugs, or dietary supplements you use. Also tell them if you smoke, drink alcohol, or use illegaldrugs. Some  items may interact with your medicine. What should I watch for while using this medication? Your condition will be monitored carefully while you are receiving this medicine. You will need important blood work and urine testing done while youare taking this medicine. This medicine may increase your risk to bruise or bleed. Call your doctor orhealth care professional if you notice any unusual bleeding. Before having surgery, talk to your health care provider to make sure it is ok. This drug can increase the risk of poor healing of your surgical site or wound. You will need to stop this drug for 28 days before surgery. After surgery, wait at least 28 days before restarting this drug. Make sure the surgical site or wound is healed enough before restarting this drug. Talk to your health careprovider if questions. Do not become pregnant while taking this medicine or for 6 months after stopping it. Women should inform their doctor if they wish to become pregnant or think they might be pregnant. There is a potential  for serious side effects to an unborn child. Talk to your health care professional or pharmacist for more information. Do not breast-feed an infant while taking this medicine andfor 6 months after the last dose. This medicine has caused ovarian failure in some women. This medicine may interfere with the ability to have a child. You should talk to your doctor orhealth care professional if you are concerned about your fertility. What side effects may I notice from receiving this medication? Side effects that you should report to your doctor or health care professionalas soon as possible: allergic reactions like skin rash, itching or hives, swelling of the face, lips, or tongue chest pain or chest tightness chills coughing up blood high fever seizures severe constipation signs and symptoms of bleeding such as bloody or black, tarry stools; red or dark-brown urine; spitting up blood or brown material  that looks like coffee grounds; red spots on the skin; unusual bruising or bleeding from the eye, gums, or nose signs and symptoms of a blood clot such as breathing problems; chest pain; severe, sudden headache; pain, swelling, warmth in the leg signs and symptoms of a stroke like changes in vision; confusion; trouble speaking or understanding; severe headaches; sudden numbness or weakness of the face, arm or leg; trouble walking; dizziness; loss of balance or coordination stomach pain sweating swelling of legs or ankles vomiting weight gain Side effects that usually do not require medical attention (report to yourdoctor or health care professional if they continue or are bothersome): back pain changes in taste decreased appetite dry skin nausea tiredness This list may not describe all possible side effects. Call your doctor for medical advice about side effects. You may report side effects to FDA at1-800-FDA-1088. Where should I keep my medication? This drug is given in a hospital or clinic and will not be stored at home. NOTE: This sheet is a summary. It may not cover all possible information. If you have questions about this medicine, talk to your doctor, pharmacist, orhealth care provider.  2022 Elsevier/Gold Standard (2019-04-26 10:50:46)

## 2020-12-24 NOTE — Telephone Encounter (Signed)
Sarah Can we add pt to cancelation list for dr Gabriel Carina?

## 2020-12-25 ENCOUNTER — Ambulatory Visit: Admission: RE | Admit: 2020-12-25 | Payer: Managed Care, Other (non HMO) | Source: Ambulatory Visit

## 2020-12-25 LAB — CEA: CEA: 1511 ng/mL — ABNORMAL HIGH (ref 0.0–4.7)

## 2020-12-26 ENCOUNTER — Other Ambulatory Visit: Payer: Self-pay

## 2020-12-26 ENCOUNTER — Inpatient Hospital Stay: Payer: Managed Care, Other (non HMO)

## 2020-12-26 VITALS — BP 147/81 | HR 99 | Temp 98.3°F | Resp 20

## 2020-12-26 DIAGNOSIS — I10 Essential (primary) hypertension: Secondary | ICD-10-CM | POA: Diagnosis not present

## 2020-12-26 DIAGNOSIS — Z5111 Encounter for antineoplastic chemotherapy: Secondary | ICD-10-CM | POA: Diagnosis present

## 2020-12-26 DIAGNOSIS — E876 Hypokalemia: Secondary | ICD-10-CM | POA: Diagnosis not present

## 2020-12-26 DIAGNOSIS — T451X5A Adverse effect of antineoplastic and immunosuppressive drugs, initial encounter: Secondary | ICD-10-CM | POA: Diagnosis not present

## 2020-12-26 DIAGNOSIS — C787 Secondary malignant neoplasm of liver and intrahepatic bile duct: Secondary | ICD-10-CM

## 2020-12-26 DIAGNOSIS — C20 Malignant neoplasm of rectum: Secondary | ICD-10-CM

## 2020-12-26 DIAGNOSIS — Z7982 Long term (current) use of aspirin: Secondary | ICD-10-CM | POA: Diagnosis not present

## 2020-12-26 DIAGNOSIS — E119 Type 2 diabetes mellitus without complications: Secondary | ICD-10-CM | POA: Diagnosis not present

## 2020-12-26 DIAGNOSIS — Z79899 Other long term (current) drug therapy: Secondary | ICD-10-CM | POA: Diagnosis not present

## 2020-12-26 DIAGNOSIS — D3A8 Other benign neuroendocrine tumors: Secondary | ICD-10-CM | POA: Diagnosis not present

## 2020-12-26 DIAGNOSIS — G620 Drug-induced polyneuropathy: Secondary | ICD-10-CM | POA: Diagnosis not present

## 2020-12-26 DIAGNOSIS — Z7951 Long term (current) use of inhaled steroids: Secondary | ICD-10-CM | POA: Diagnosis not present

## 2020-12-26 DIAGNOSIS — Z794 Long term (current) use of insulin: Secondary | ICD-10-CM | POA: Diagnosis not present

## 2020-12-26 DIAGNOSIS — K6289 Other specified diseases of anus and rectum: Secondary | ICD-10-CM | POA: Diagnosis not present

## 2020-12-26 MED ORDER — HEPARIN SOD (PORK) LOCK FLUSH 100 UNIT/ML IV SOLN
INTRAVENOUS | Status: AC
  Filled 2020-12-26: qty 5

## 2020-12-26 MED ORDER — SODIUM CHLORIDE 0.9% FLUSH
10.0000 mL | INTRAVENOUS | Status: DC | PRN
  Administered 2020-12-26: 10 mL
  Filled 2020-12-26: qty 10

## 2020-12-26 MED ORDER — HEPARIN SOD (PORK) LOCK FLUSH 100 UNIT/ML IV SOLN
500.0000 [IU] | Freq: Once | INTRAVENOUS | Status: AC | PRN
  Administered 2020-12-26: 500 [IU]
  Filled 2020-12-26: qty 5

## 2020-12-27 ENCOUNTER — Encounter: Payer: Self-pay | Admitting: Oncology

## 2021-01-06 ENCOUNTER — Ambulatory Visit: Payer: Managed Care, Other (non HMO) | Admitting: Pharmacist

## 2021-01-06 DIAGNOSIS — I10 Essential (primary) hypertension: Secondary | ICD-10-CM

## 2021-01-06 DIAGNOSIS — Z794 Long term (current) use of insulin: Secondary | ICD-10-CM

## 2021-01-06 DIAGNOSIS — E119 Type 2 diabetes mellitus without complications: Secondary | ICD-10-CM

## 2021-01-06 MED ORDER — LANTUS SOLOSTAR 100 UNIT/ML ~~LOC~~ SOPN: 64.0000 [IU] | PEN_INJECTOR | Freq: Every day | SUBCUTANEOUS | 3 refills | Status: AC

## 2021-01-06 NOTE — Patient Instructions (Signed)
Visit Information   Goals Addressed               This Visit's Progress     Medication Monitoring (pt-stated)        Patient Goals/Self-Care Activities Over the next 90 days, patient will:  - take medications as prescribed - check glucose at least three times daily using CGM, document, and provide at future appointments - Collaborate with interdisciplinary team.          Patient verbalizes understanding of instructions provided today and agrees to view in [place].   Telephone follow up appointment with care management team member scheduled for: ~4 weeks  Lorel Monaco, PharmD, [place]

## 2021-01-06 NOTE — Addendum Note (Signed)
Addended by: De Hollingshead on: 01/06/2021 01:05 PM   Modules accepted: Orders

## 2021-01-06 NOTE — Chronic Care Management (AMB) (Addendum)
Care Management   Pharmacy Note  01/06/2021 Name: Dominic [name] MRN: [id number] DOB: May 07, 1971  Subjective: Dominic [name] is a 50 y.o. year old male who is a primary care patient of Burnard Hawthorne, FNP. The Care Management team was consulted for assistance with care management and care coordination needs.    Engaged with patient by telephone for follow up visit in response to provider referral for pharmacy case management and/or care coordination services.   The patient was given information about Care Management services today including:  Care Management services includes personalized support from designated clinical staff supervised by the patient's primary care provider, including individualized plan of care and coordination with other care providers. 24/7 contact phone numbers for assistance for urgent and routine care needs. The patient may stop case management services at any time by phone call to the office staff.  Patient agreed to services and consent obtained.  Assessment:  Review of patient status, including review of consultants reports, laboratory and other test data, was performed as part of comprehensive evaluation and provision of chronic care management services.   SDOH (Social Determinants of Health) assessments and interventions performed:  SDOH Interventions    Flowsheet Row Most Recent Value  SDOH Interventions   Financial Strain Interventions Intervention Not Indicated        Objective:  Lab Results  Component Value Date   CREATININE 0.79 12/24/2020   CREATININE 0.89 11/27/2020   CREATININE 0.80 11/13/2020    Lab Results  Component Value Date   HGBA1C 9.4 (A) 11/29/2020       Component Value Date/Time   CHOL 226 (H) 03/07/2020 0902   TRIG 123 03/07/2020 0902   HDL 33 (L) 03/07/2020 0902   CHOLHDL 6.8 (H) 03/07/2020 0902   LDLCALC 170 (H) 03/07/2020 0902   Clinical ASCVD: No  The 10-year ASCVD risk score [name], et al., 2013) is:  15.1%   Values used to calculate the score:     Age: 15 years     Sex: Male     Is Non-Hispanic African American: No     Diabetic: Yes     Tobacco smoker: No     Systolic Blood Pressure: 242 mmHg     Is BP treated: Yes     HDL Cholesterol: 33 mg/dL     Total Cholesterol: 226 mg/dL    BP Readings from Last 3 Encounters:  12/26/20 (!) 147/81  12/24/20 (!) 150/72  12/24/20 94/66    Care Plan  No Known Allergies  Medications Reviewed Today     Reviewed by Avie Arenas, RPH (Pharmacist) on 01/06/21 at 0954  Med List Status: <None>   Medication Order Taking? Sig Documenting Provider Last Dose Status Informant  0.9 %  sodium chloride infusion [id number]   Danis, Estill Cotta III, MD  Active   albuterol (VENTOLIN HFA) 108 (90 Base) MCG/ACT inhaler [id number] No Inhale 2 puffs into the lungs every 6 (six) hours as needed for wheezing or shortness of breath.  Patient not taking: Reported on 01/06/2021   Doreen Beam, FNP Not Taking Active   ALPHA LIPOIC ACID PO [id number] Yes Take 1 capsule by mouth daily. [provider] Taking Active   ALPRAZolam Duanne Moron) 0.5 MG tablet [id number] Yes Take half tablet to one tablet by mouth as needed daily for insomnia, anxiety. Burnard Hawthorne, FNP Taking Active   amLODipine (NORVASC) 10 MG tablet [id number] Yes TAKE 1 TABLET BY MOUTH  DAILY End, Harrell Gave,  MD Taking Active   aspirin EC 81 MG tablet [id number] Yes Take 1 tablet (81 mg total) by mouth daily. Swallow whole. Earlie Server, MD Taking Active   B Complex Vitamins (VITAMIN B COMPLEX PO) [id number] Yes Take 1 tablet by mouth daily. [provider] Taking Active Self  benzonatate (TESSALON) 100 MG capsule [id number] Yes Take 1 capsule (100 mg total) by mouth 2 (two) times daily as needed for cough. Flinchum, Kelby Aline, FNP Taking Active   Continuous Blood Gluc Sensor (FREESTYLE LIBRE 2 SENSOR) [place] [id number] No Scan glucose at least QID  Patient not taking: Reported on 01/06/2021    Burnard Hawthorne, FNP Not Taking Active   CONTOUR NEXT TEST test strip [id number]  USE 1 STRIP TO TEST BLOOD  SUGAR TWICE DAILY Burnard Hawthorne, FNP  Active   fluticasone-salmeterol (ADVAIR) 100-50 MCG/ACT AEPB [id number] No Inhale 1 puff into the lungs 2 (two) times daily. Rinse mouth out after each use, swish with water and spit out after each use.  Patient not taking: No sig reported   Flinchum, Kelby Aline, FNP Not Taking Active   gabapentin (NEURONTIN) 100 MG capsule [id number] Yes Take 2 capsules (200 mg total) by mouth 3 (three) times daily. Earlie Server, MD Taking Active   hydrochlorothiazide (HYDRODIURIL) 25 MG tablet [id number] Yes Take 25 mg by mouth daily. [provider] Taking Active   insulin glargine (LANTUS SOLOSTAR) 100 UNIT/ML Solostar Pen [id number] Yes Inject 56 Units into the skin daily.  Patient taking differently: Inject 58 Units into the skin daily.   Burnard Hawthorne, FNP Taking Active   insulin lispro (HUMALOG KWIKPEN) 100 UNIT/ML KwikPen [id number] Yes Inject 18 Units into the skin 3 (three) times daily. Take with meals. Hold if premeal glucose <100 Burnard Hawthorne, FNP Taking Active   Insulin Pen Needle (PEN NEEDLES) 32G X 6 MM MISC [id number]  Used to give insulin injections 4 times daily. Burnard Hawthorne, FNP  Active   lactulose (CHRONULAC) 10 GM/15ML solution [id number] No Take 15 mLs (10 g total) by mouth 2 (two) times daily as needed for moderate constipation or severe constipation.  Patient not taking: No sig reported   Earlie Server, MD Not Taking Active   lidocaine-prilocaine (EMLA) cream [id number] Yes Apply to affected area once Earlie Server, MD Taking Active   loperamide (IMODIUM A-D) 2 MG tablet [id number] No Take 1 tablet (2 mg total) by mouth See admin instructions. Take 2 tabs at diarrhea onset , then 1 tab after every loose bowel movement. Maximum 8 tablets within 24 hour period of time  Patient not taking: Reported on 01/06/2021   Earlie Server, MD Not Taking  Active   magic mouthwash w/lidocaine SOLN [id number] No Take 5 mLs by mouth 4 (four) times daily as needed for mouth pain. 80 ml viscous lidocaine 2%, 80 ml Mylanta, 80 ml Diphenhydramine 12.5 mg/5 ml Elixir, 80 ml Nystatin 100,000 Unit suspension, 80 ml Prednisolone 15 mg/72ml, 80 ml Distilled Water.  Sig: Swish/Swallow 5-10 ml four times a day as needed.  Patient not taking: No sig reported   Earlie Server, MD Not Taking Active   [name] [id number] Yes Take 5 mg by mouth at bedtime as needed. [provider] Taking Active   metFORMIN (GLUCOPHAGE XR) 500 MG 24 hr tablet [id number] Yes Take 4 tablets (2,000 mg total) by mouth every evening. Burnard Hawthorne, FNP Taking Active            Med Note (Golden Emile,  Julious Langlois A   Mon Nov 25, 2020 11:36 AM) 2 tablets daily   metoprolol succinate (TOPROL-XL) 100 MG 24 hr tablet [id number] Yes TAKE 1 TABLET BY MOUTH  DAILY WITH OR IMMEDIATELY  FOLLOWING A MEAL Leone Haven, MD Taking Active   Microlet Lancets MISC [id number]  Used to check blood sugars three times a day. Burnard Hawthorne, FNP  Active   Omega-3 Fatty Acids (FISH OIL PO) [id number] Yes Take 800 mg by mouth daily. [provider] Taking Active Self  omeprazole (PRILOSEC) 20 MG capsule [id number] No Take 1 capsule (20 mg total) by mouth in the morning and at bedtime.  Patient not taking: Reported on 01/06/2021   Earlie Server, MD Not Taking Active   ondansetron (ZOFRAN ODT) 4 MG disintegrating tablet [id number] No Take 1 tablet (4 mg total) by mouth every 8 (eight) hours as needed for nausea or vomiting.  Patient not taking: No sig reported   Burnard Hawthorne, FNP Not Taking Active   ondansetron (ZOFRAN) 8 MG tablet [id number] No Take 1 tablet (8 mg total) by mouth 2 (two) times daily as needed for refractory nausea / vomiting. Start on day 3 after chemotherapy.  Patient not taking: Reported on 01/06/2021   Earlie Server, MD Not Taking Active   polyethylene glycol powder (GLYCOLAX/MIRALAX) 17  GM/SCOOP powder [id number] No Take by mouth daily. 1/2 capful QD  Patient not taking: No sig reported   [provider] Not Taking Active   potassium chloride (KLOR-CON) 10 MEQ tablet [id number] Yes Take 1 tablet (10 mEq total) by mouth daily. Earlie Server, MD Taking Active   prochlorperazine (COMPAZINE) 10 MG tablet [id number] No Take 1 tablet (10 mg total) by mouth every 6 (six) hours as needed (Nausea or vomiting).  Patient not taking: No sig reported   Earlie Server, MD Not Taking Active   senna (SENOKOT) 8.6 MG TABS tablet [id number] No Take 2 tablets (17.2 mg total) by mouth daily.  Patient not taking: No sig reported   Earlie Server, MD Not Taking Active   tamsulosin (FLOMAX) 0.4 MG CAPS capsule [id number] No Take 1 capsule (0.4 mg total) by mouth daily.  Patient not taking: No sig reported   Stoioff, Ronda Fairly, MD Not Taking Active   telmisartan (MICARDIS) 40 MG tablet [id number] Yes Take 1 tablet (40 mg total) by mouth daily. PLEASE CALL OFFICE TO SCHEDULE FOLLOW UP APPOINTMENT FOR REFILLS. Burnard Hawthorne, FNP Taking Active   zinc gluconate 50 MG tablet [id number] Yes Take 50 mg by mouth daily. [provider] Taking Active             Patient Active Problem List   Diagnosis Date Noted   GERD (gastroesophageal reflux disease) 12/16/2020   Hypertriglyceridemia 12/16/2020   Hyperosmolar coma due to type 2 diabetes mellitus ([place]) 12/16/2020   Tachycardia 09/11/2020   Nausea and vomiting 09/11/2020   Genetic testing 07/23/2020   Family history of esophageal cancer    Family history of prostate cancer    Insomnia 06/14/2020   Neuropathy due to chemotherapeutic drug ([place]) 06/11/2020   Uncontrolled diabetes mellitus ([place]) 06/11/2020   Encounter for antineoplastic chemotherapy 06/03/2020   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   Rectal cancer metastasized to liver ([place]) 05/27/2020   Goals of care, counseling/discussion 05/19/2020   Dysuria 05/07/2020   Acute  prostatitis 05/07/2020   Liver disease 05/02/2020   Bloating 03/13/2020   Hepatic steatosis 08/14/2019   Unstable angina ([place]) 08/11/2019  H/O supraventricular tachycardia 08/11/2019   Morbid obesity ([place]) 08/11/2019   COVID-19 07/18/2019   Type 2 diabetes mellitus without complication, with long-term current use of insulin ([place]) 02/27/2019   SVT (supraventricular tachycardia) ([place]) 02/27/2019   Hypertension 02/27/2019   Psoriatic arthritis ([place]) 02/27/2019    Conditions to be addressed/monitored: HTN, HLD, and DMII  Care Plan : Medication Management  Updates made by Jaton Eilers A, RPH since 01/06/2021 12:00 AM     Problem: Diabetes, Cancer, HTN w/ SVT      Long-Range Goal: Disease Progression Prevention   This Visit's Progress: On track  Recent Progress: On track  Priority: High  Note:   Current Barriers:  Unable to independently afford treatment regimen Unable to achieve control of diabetes  Complex patient with recent cancer diagnosis  Pharmacist Clinical Goal(s):  Over the next 90 days, patient will verbalize ability to afford treatment regimen. Over the next 90 days, patient will adhere to prescribed medication regimen  Interventions: 1:1 collaboration with Burnard Hawthorne, FNP regarding development and update of comprehensive plan of care as evidenced by provider attestation and co-signature Inter-disciplinary care team collaboration (see longitudinal plan of care) Comprehensive medication review performed; medication list updated in electronic medical record  Diabetes: Uncontrolled; current treatment: metformin XR 500 mg QAM, 1500 mg QPM, Lantus 58 units daily, Humalog 18-20 units TID w/meals.  Pt reduced metformin XR to 500 mg BID to reduce neuropathy given metformin has risk for vitamin B12 deficiency Denies hypoglycemic episodes Current glucose readings: fasting 160-low 200s; after meals: 150-200s up to 300s Reports libre sensors cost prohibitive at this  time Diet - Eating 2-3 meals/day. Occasional nausea. Indulges in sweets occassionally (chocolate covered almonds), carb-sensitive; eats vegetables first, then protein, then carbs to minimize after meal BG spike Will provide Libre sensor samples. Patient expressed appreciation.  Initial appointment with endocrinology, Dr. Gabriel Carina on 8/23 Follow-up appointment with Joycelyn Schmid (PCP) on 8/22 Given elevated BG throughout the day, recommend increasing basal insulin (Lantus) by 6 units to 64 units daily (~10% dose increase). Discussed with PCP , she is in agreement. . Recommended to continue Humalog 18-20 units TID w/meals.   Hypertension w/ SVT Appropriately controlled given comorbidities; current treatment: amlodipine 10 mg, HCTZ 25 mg daily, metoprolol succinate 100 mg daily, telmisartan 40 mg daily   Home BP: 130/80 Reports one episode of SBP ~98 during chemotherapy requiring IV fluids.  Encouraged to continue current therapy at this time and collaboration with cardiology  Malignancy: Followed by Dr. Tasia Catchings. Symptomatic management includes: alprazolam 0.25-0.5 mg PRN anxiety; gabapentin 100 mg TID, lactulose 10 g BID PRN severe constipation, ondansetron ODT PRN nausea, potassium 10 mEq daily, prochlorperazine 10 mg PRN nausea, senna 8.6 mg PRN moderate constipation Receives IV steroids and IV dextrose with each chemotherapy cycle Reports new chemotherapy regimen for pancreatic cancer management Continue current collaboration with oncology at this time  BPH: Controlled per patient report; current regimen: tamsulosin 0.4 mg daily (not taking); follows w/ Dr. Diamantina [place] Continue current regimen along with urology collaboration at this time  Patient Goals/Self-Care Activities Over the next 90 days, patient will:  - take medications as prescribed check glucose at least three times daily using CGM, document, and provide at future appointments Collaborate with interdisciplinary team   Follow Up Plan:  Telephone follow up appointment with care management team member scheduled for: ~4 weeks      Medication Assistance:  None required.  Patient affirms current coverage meets needs.  Follow Up:  Patient agrees  to Care Plan and Follow-up.  Plan: Telephone follow up appointment with care management team member scheduled for:  ~4 weeks  Lorel Monaco, PharmD, BCPS PGY2 [place]   I was present for this visit and agree with the documentation by the resident as above.   Catie Darnelle Maffucci, PharmD, [place], [place] Clinical Pharmacist [employer] [facility] [place]

## 2021-01-07 ENCOUNTER — Inpatient Hospital Stay (HOSPITAL_BASED_OUTPATIENT_CLINIC_OR_DEPARTMENT_OTHER): Payer: Managed Care, Other (non HMO) | Admitting: Oncology

## 2021-01-07 ENCOUNTER — Other Ambulatory Visit: Payer: Self-pay

## 2021-01-07 ENCOUNTER — Encounter: Payer: Self-pay | Admitting: Oncology

## 2021-01-07 ENCOUNTER — Inpatient Hospital Stay: Payer: Managed Care, Other (non HMO)

## 2021-01-07 VITALS — BP 143/79 | HR 100 | Temp 98.0°F | Resp 18 | Wt 246.1 lb

## 2021-01-07 DIAGNOSIS — G620 Drug-induced polyneuropathy: Secondary | ICD-10-CM

## 2021-01-07 DIAGNOSIS — Z7189 Other specified counseling: Secondary | ICD-10-CM

## 2021-01-07 DIAGNOSIS — C7A8 Other malignant neuroendocrine tumors: Secondary | ICD-10-CM | POA: Diagnosis not present

## 2021-01-07 DIAGNOSIS — Z5111 Encounter for antineoplastic chemotherapy: Secondary | ICD-10-CM | POA: Diagnosis not present

## 2021-01-07 DIAGNOSIS — C20 Malignant neoplasm of rectum: Secondary | ICD-10-CM | POA: Diagnosis not present

## 2021-01-07 DIAGNOSIS — C7951 Secondary malignant neoplasm of bone: Secondary | ICD-10-CM

## 2021-01-07 DIAGNOSIS — T451X5A Adverse effect of antineoplastic and immunosuppressive drugs, initial encounter: Secondary | ICD-10-CM

## 2021-01-07 DIAGNOSIS — C787 Secondary malignant neoplasm of liver and intrahepatic bile duct: Secondary | ICD-10-CM

## 2021-01-07 LAB — COMPREHENSIVE METABOLIC PANEL
ALT: 20 U/L (ref 0–44)
AST: 27 U/L (ref 15–41)
Albumin: 3.7 g/dL (ref 3.5–5.0)
Alkaline Phosphatase: 114 U/L (ref 38–126)
Anion gap: 9 (ref 5–15)
BUN: 17 mg/dL (ref 6–20)
CO2: 26 mmol/L (ref 22–32)
Calcium: 9.1 mg/dL (ref 8.9–10.3)
Chloride: 99 mmol/L (ref 98–111)
Creatinine, Ser: 0.8 mg/dL (ref 0.61–1.24)
GFR, Estimated: >60 mL/min (ref >60)
Glucose, Bld: 178 mg/dL — ABNORMAL HIGH (ref 70–99)
Potassium: 4.2 mmol/L (ref 3.5–5.1)
Sodium: 134 mmol/L — ABNORMAL LOW (ref 135–145)
Total Bilirubin: 0.6 mg/dL (ref 0.3–1.2)
Total Protein: 7.2 g/dL (ref 6.5–8.1)

## 2021-01-07 LAB — CBC WITH DIFFERENTIAL/PLATELET
Abs Immature Granulocytes: 0.02 10*3/uL (ref 0.00–0.07)
Basophils Absolute: 0.1 10*3/uL (ref 0.0–0.1)
Basophils Relative: 1 %
Eosinophils Absolute: 0.3 10*3/uL (ref 0.0–0.5)
Eosinophils Relative: 5 %
HCT: 39 % (ref 39.0–52.0)
Hemoglobin: 12.8 g/dL — ABNORMAL LOW (ref 13.0–17.0)
Immature Granulocytes: 0 %
Lymphocytes Relative: 31 %
Lymphs Abs: 1.7 10*3/uL (ref 0.7–4.0)
MCH: 28.4 pg (ref 26.0–34.0)
MCHC: 32.8 g/dL (ref 30.0–36.0)
MCV: 86.7 fL (ref 80.0–100.0)
Monocytes Absolute: 0.5 10*3/uL (ref 0.1–1.0)
Monocytes Relative: 9 %
Neutro Abs: 2.9 10*3/uL (ref 1.7–7.7)
Neutrophils Relative %: 54 %
Platelets: 242 10*3/uL (ref 150–400)
RBC: 4.5 MIL/uL (ref 4.22–5.81)
RDW: 13.7 % (ref 11.5–15.5)
WBC: 5.4 10*3/uL (ref 4.0–10.5)
nRBC: 0 % (ref 0.0–0.2)

## 2021-01-07 MED ORDER — SODIUM CHLORIDE 0.9 % IV SOLN
2400.0000 mg/m2 | INTRAVENOUS | Status: DC
  Administered 2021-01-07: 5500 mg via INTRAVENOUS
  Filled 2021-01-07: qty 110

## 2021-01-07 MED ORDER — ATROPINE SULFATE 1 MG/ML IJ SOLN
0.5000 mg | Freq: Once | INTRAMUSCULAR | Status: AC
  Administered 2021-01-07: 0.5 mg via INTRAVENOUS
  Filled 2021-01-07: qty 1

## 2021-01-07 MED ORDER — PALONOSETRON HCL INJECTION 0.25 MG/5ML
0.2500 mg | Freq: Once | INTRAVENOUS | Status: AC
  Administered 2021-01-07: 0.25 mg via INTRAVENOUS
  Filled 2021-01-07: qty 5

## 2021-01-07 MED ORDER — SODIUM CHLORIDE 0.9 % IV SOLN
10.0000 mg | Freq: Once | INTRAVENOUS | Status: AC
  Administered 2021-01-07: 10 mg via INTRAVENOUS
  Filled 2021-01-07: qty 10

## 2021-01-07 MED ORDER — SODIUM CHLORIDE 0.9 % IV SOLN
Freq: Once | INTRAVENOUS | Status: AC
  Filled 2021-01-07: qty 250

## 2021-01-07 MED ORDER — SODIUM CHLORIDE 0.9 % IV SOLN
150.0000 mg/m2 | Freq: Once | INTRAVENOUS | Status: AC
  Administered 2021-01-07: 340 mg via INTRAVENOUS
  Filled 2021-01-07: qty 15

## 2021-01-07 MED ORDER — SODIUM CHLORIDE 0.9% FLUSH
10.0000 mL | Freq: Once | INTRAVENOUS | Status: AC
  Administered 2021-01-07: 10 mL via INTRAVENOUS
  Filled 2021-01-07: qty 10

## 2021-01-07 MED ORDER — SODIUM CHLORIDE 0.9 % IV SOLN
900.0000 mg | Freq: Once | INTRAVENOUS | Status: AC
  Administered 2021-01-07: 900 mg via INTRAVENOUS
  Filled 2021-01-07: qty 45

## 2021-01-07 MED ORDER — HEPARIN SOD (PORK) LOCK FLUSH 100 UNIT/ML IV SOLN
500.0000 [IU] | Freq: Once | INTRAVENOUS | Status: DC
  Filled 2021-01-07: qty 5

## 2021-01-07 MED ORDER — FLUOROURACIL CHEMO INJECTION 2.5 GM/50ML
400.0000 mg/m2 | Freq: Once | INTRAVENOUS | Status: AC
  Administered 2021-01-07: 900 mg via INTRAVENOUS
  Filled 2021-01-07: qty 18

## 2021-01-07 NOTE — Progress Notes (Signed)
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  #reports symptoms of upper abdomen band like discomfort and bloating for a few months, improved symptoms with omeprazole,.Chronic constipation, Dominic has blood in the stool occationally Seen by PCP in September 2021. AlK phosphatase was recently noted to be elevated as well well elevated GGT. Dominic was referred to see gastroenteralgias on 04/10/2020.  04/25/2020 EGD showed gastric fundus mild inflammation. Biopsy showed reactive gastropathy.  04/30/2020 [place] RUQ showed cirrhosis with multiple hepatic masses.  05/01/2020 AFP 1.7 05/02/2020 MR liver [name] wo contrast showed numerous heterozygous enhancing liver lesions, throughout the liver, not typical for [name], likely metastatic disease.  Solid appearing enhancing lesion in pancreatic tail 1.9x1.5cm, suspicious for primary neoplasm.  Mild adenopathy within porta hepatic region and porta hepatic region. Splenomegaly.   Patient has history of SVT, psoriatic arthritis, morbid obesity, GERD, DM.  Family history + maternal uncle with esophageal cancer.   + unintentional weight loss, no fever, chills. "sweats a lot". Currently on antibiotics for proctitis.  Dominic  works at [employer], lives in [place]. Dominic lives with his partner.    # 05/17/2020, colonoscopy showed a fungating and infiltrative partially obstructing large mass in the proximal rectum and in the mid rectum.  The mass was partially circumferential, involving two thirds of the lumen circumference is.  Biopsy showed adenocarcinoma.  05/23/2020, patient underwent EUS biopsy of the pancreatic tail lesion.  Biopsy pathology showed well-differentiated neuroendocrine tumor.  Ki-67 is noncontributory due to insufficient tissue in the cell block.  # Genetic testing is negative.  #06/03/2020  Start FOLFOX and bevacizumab. #09/18/2020 Infusion reaction to oxaliplatin during cycle 8 FOLFOX bevacizumab. # 10/02/2020 FOLFIRI +Bevacitzumab Patient has had intermittent SVT symptoms.  Dominic is overdue for his appointment with cardiology. #12/16/2020, patient was seen by urgent care and was diagnosed with pneumonia prescribed amoxicillin for 5 days which Dominic completed.  I do not see any x-ray was obtained at that point. 12/21/2020, patient continues to have symptoms and was seen by urgent care.  X-ray was obtained on 12/21/2020 which showed lungs are clear, no effusion or pneumothorax.  Patient was prescribed Levaquin and tapering course of steroid.   INTERVAL HISTORY Dominic [name] is a 50 y.o. 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 Problems and complaints are listed below:  12/27/20 CT chest abdomen pelvis at [name] showed Enlarging hepatic metastases. Known colon primary is not clearly  delineated. 2. Stable indeterminate splenic lesion, not previously FDG avid. 3. Stable indeterminate 5 mm right apical pulmonary nodule. Attention on follow-up.    Dominic was seen by [facility] oncology Dr.Jia  and discussed on tumor board for chemotherapy refractory disease. Dominic was also seen by hepatic surgeon Dominic [name] for  evaluation  of HAI pump and was considered as a candidate.  I have discussed with Dominic [name] about the plan.   Today Dominic reports feeling ok, no pain. Denies dizziness, sob, abdomen pain or difficulty passing bowel movements.    Review of Systems  Constitutional:  Positive for fatigue. Negative for appetite change, chills, fever and unexpected weight change.  HENT:   Negative for hearing loss and voice change.   Eyes:  Negative for eye problems and icterus.  Respiratory:  Negative for chest tightness, cough and shortness of breath.   Cardiovascular:  Negative for chest pain, leg swelling and palpitations.  Gastrointestinal:  Negative for abdominal distention and nausea.       Burping  Endocrine: Negative for hot flashes.  Genitourinary:  Negative for difficulty urinating, dysuria and frequency.   Musculoskeletal:  Negative for arthralgias.  Skin:  Negative for itching and rash.  Neurological:  Positive for numbness. Negative for light-headedness.  Hematological:  Negative for adenopathy. Does not bruise/bleed easily.  Psychiatric/Behavioral:  Negative for confusion.    MEDICAL HISTORY:  Past Medical History:  Diagnosis Date   Anxiety    Cancer [facility])    Rectal cancer metastasized to liver ([place])   Diabetes mellitus without complication (HCC)    Dysrhythmia    svt   Family history of esophageal cancer    Family history of prostate cancer    GERD (gastroesophageal reflux disease)    High triglycerides    Hypertension    Long-term insulin use in type 2 diabetes ([place])    Morbid obesity (HCC)    Psoriatic arthritis ([place])    SVT (supraventricular tachycardia) ([place])     SURGICAL HISTORY: Past Surgical History:  Procedure Laterality Date   EUS N/A 05/23/2020   Procedure: FULL UPPER ENDOSCOPIC ULTRASOUND (EUS) RADIAL;  Surgeon: Jola Schmidt, MD;  Location: [facility] ENDOSCOPY;  Service: Endoscopy;  Laterality: N/A;   PORTA CATH INSERTION N/A 05/30/2020   Procedure: PORTA CATH INSERTION;   Surgeon: Algernon Huxley, MD;  Location: [facility] CV LAB;  Service: Cardiovascular;  Laterality: N/A;   UPPER GASTROINTESTINAL ENDOSCOPY      SOCIAL HISTORY: Social History   Socioeconomic History   Marital status: Soil scientist    Spouse name: [name]   Number of children: Not on file   Years of education: Not on file   Highest education level: Not on file  Occupational History   Not on file  Tobacco Use   Smoking status: Never   Smokeless tobacco: Never  Vaping Use   Vaping Use: Never used  Substance and Sexual Activity   Alcohol use: Yes    Alcohol/week: 1.0 standard drink    Types: 1 Glasses of wine per week    Comment: very occasional   Drug use: Never   Sexual activity: Not on file  Other Topics Concern   Not on file  Social History Narrative   Lives in [place] partner [place].  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     Social Determinants of Health   Financial Resource Strain: Medium Risk   Difficulty of Paying Living Expenses: Somewhat hard  Food Insecurity: Not on file  Transportation Needs: Not on file  Physical Activity: Not on file  Stress: Not on file  Social Connections: Not on file  Intimate Partner Violence: Not on file    FAMILY HISTORY: Family History  Problem Relation Age of Onset   Arthritis Mother    Heart disease Mother    Supraventricular tachycardia Mother  s/p ablation   Hypertension Mother    Diabetes Father    Heart attack Maternal Uncle    Throat cancer Maternal Uncle    Esophageal cancer Maternal Uncle    Heart attack Maternal Uncle    Heart attack Maternal Uncle    Prostate cancer Maternal Uncle    Thyroid cancer Neg Hx    Colon cancer Neg Hx    Rectal cancer Neg Hx    Stomach cancer Neg Hx    Colon polyps Neg Hx     ALLERGIES:  has No Known Allergies.  MEDICATIONS:  Current Outpatient Medications  Medication Sig Dispense Refill   albuterol (VENTOLIN HFA) 108 (90 Base) MCG/ACT inhaler Inhale 2 puffs into the lungs every  6 (six) hours as needed for wheezing or shortness of breath. (Patient not taking: Reported on 01/06/2021) 8 g 0   ALPHA LIPOIC ACID PO Take 1 capsule by mouth daily.     ALPRAZolam (XANAX) 0.5 MG tablet Take half tablet to one tablet by mouth as needed daily for insomnia, anxiety. 30 tablet 1   amLODipine (NORVASC) 10 MG tablet TAKE 1 TABLET BY MOUTH  DAILY 90 tablet 3   aspirin EC 81 MG tablet Take 1 tablet (81 mg total) by mouth daily. Swallow whole. 30 tablet 11   B Complex Vitamins (VITAMIN B COMPLEX PO) Take 1 tablet by mouth daily.     Barberry-Oreg Grape-Goldenseal (BERBERINE COMPLEX PO) Take 1 tablet by mouth daily.     benzonatate (TESSALON) 100 MG capsule Take 1 capsule (100 mg total) by mouth 2 (two) times daily as needed for cough. 20 capsule 0   Continuous Blood Gluc Sensor (FREESTYLE LIBRE 2 SENSOR) MISC Scan glucose at least QID (Patient not taking: Reported on 01/06/2021) 2 each 11   CONTOUR NEXT TEST test strip USE 1 STRIP TO TEST BLOOD  SUGAR TWICE DAILY 200 strip 3   fluticasone-salmeterol (ADVAIR) 100-50 MCG/ACT AEPB Inhale 1 puff into the lungs 2 (two) times daily. Rinse mouth out after each use, swish with water and spit out after each use. (Patient not taking: No sig reported) 1 each 0   gabapentin (NEURONTIN) 100 MG capsule Take 2 capsules (200 mg total) by mouth 3 (three) times daily. 540 capsule 0   hydrochlorothiazide (HYDRODIURIL) 25 MG tablet Take 25 mg by mouth daily.     insulin glargine (LANTUS SOLOSTAR) 100 UNIT/ML Solostar Pen Inject 64 Units into the skin daily. 60 mL 3   insulin lispro (HUMALOG KWIKPEN) 100 UNIT/ML KwikPen Inject 18 Units into the skin 3 (three) times daily. Take with meals. Hold if premeal glucose <100 45 mL 5   Insulin Pen Needle (PEN NEEDLES) 32G X 6 MM MISC Used to give insulin injections 4 times daily. 100 each 10   lactulose (CHRONULAC) 10 GM/15ML solution Take 15 mLs (10 g total) by mouth 2 (two) times daily as needed for moderate constipation  or severe constipation. (Patient not taking: No sig reported) 236 mL 0   lidocaine-prilocaine (EMLA) cream Apply to affected area once 30 g 3   loperamide (IMODIUM A-D) 2 MG tablet Take 1 tablet (2 mg total) by mouth See admin instructions. Take 2 tabs at diarrhea onset , then 1 tab after every loose bowel movement. Maximum 8 tablets within 24 hour period of time (Patient not taking: Reported on 01/06/2021) 100 tablet 1   magic mouthwash [name]/lidocaine SOLN Take 5 mLs by mouth 4 (four) times daily as needed for mouth pain. 80  ml viscous lidocaine 2%, 80 ml Mylanta, 80 ml Diphenhydramine 12.5 mg/5 ml Elixir, 80 ml Nystatin 100,000 Unit suspension, 80 ml Prednisolone 15 mg/66m, 80 ml Distilled Water.  Sig: Swish/Swallow 5-10 ml four times a day as needed. (Patient not taking: No sig reported) 480 mL 3   [name] Take 5 mg by mouth at bedtime as needed.     metFORMIN (GLUCOPHAGE XR) 500 MG 24 hr tablet Take 4 tablets (2,000 mg total) by mouth every evening. 120 tablet 3   metoprolol succinate (TOPROL-XL) 100 MG 24 hr tablet TAKE 1 TABLET BY MOUTH  DAILY WITH OR IMMEDIATELY  FOLLOWING A MEAL 90 tablet 3   Microlet Lancets MISC Used to check blood sugars three times a day. 200 each 5   Omega-3 Fatty Acids (FISH OIL PO) Take 800 mg by mouth daily.     omeprazole (PRILOSEC) 20 MG capsule Take 1 capsule (20 mg total) by mouth in the morning and at bedtime. (Patient not taking: Reported on 01/06/2021) 180 capsule 0   ondansetron (ZOFRAN ODT) 4 MG disintegrating tablet Take 1 tablet (4 mg total) by mouth every 8 (eight) hours as needed for nausea or vomiting. (Patient not taking: No sig reported) 30 tablet 2   ondansetron (ZOFRAN) 8 MG tablet Take 1 tablet (8 mg total) by mouth 2 (two) times daily as needed for refractory nausea / vomiting. Start on day 3 after chemotherapy. (Patient not taking: Reported on 01/06/2021) 30 tablet 1   polyethylene glycol powder (GLYCOLAX/MIRALAX) 17 GM/SCOOP powder Take by mouth  daily. 1/2 capful QD (Patient not taking: No sig reported)     potassium chloride (KLOR-CON) 10 MEQ tablet Take 1 tablet (10 mEq total) by mouth daily. 30 tablet 0   prochlorperazine (COMPAZINE) 10 MG tablet Take 1 tablet (10 mg total) by mouth every 6 (six) hours as needed (Nausea or vomiting). (Patient not taking: No sig reported) 30 tablet 1   senna (SENOKOT) 8.6 MG TABS tablet Take 2 tablets (17.2 mg total) by mouth daily. (Patient not taking: No sig reported) 120 tablet 0   tamsulosin (FLOMAX) 0.4 MG CAPS capsule Take 1 capsule (0.4 mg total) by mouth daily. (Patient not taking: No sig reported) 30 capsule 1   telmisartan (MICARDIS) 40 MG tablet Take 1 tablet (40 mg total) by mouth daily. PLEASE CALL OFFICE TO SCHEDULE FOLLOW UP APPOINTMENT FOR REFILLS. 90 tablet 1   zinc gluconate 50 MG tablet Take 50 mg by mouth daily.     Current Facility-Administered Medications  Medication Dose Route Frequency Provider Last Rate Last Admin   0.9 %  sodium chloride infusion  500 mL Intravenous Once DDoran Stabler MD       Facility-Administered Medications Ordered in Other Visits  Medication Dose Route Frequency Provider Last Rate Last Admin   fluorouracil (ADRUCIL) 5,500 mg in sodium chloride 0.9 % 140 mL chemo infusion  2,400 mg/m2 (Treatment Plan Recorded) Intravenous 1 day or 1 dose YEarlie Server MD   5,500 mg at 01/07/21 1206   heparin lock flush 100 unit/mL  500 Units Intravenous Once YEarlie Server MD         PHYSICAL EXAMINATION: ECOG PERFORMANCE STATUS: 1 - Symptomatic but completely ambulatory Vitals:   01/07/21 0901  BP: (!) 143/79  Pulse: 100  Resp: 18  Temp: 98 F (36.7 C)   Filed Weights   01/07/21 0901  Weight: 246 lb 1.6 oz (111.6 kg)    Physical Exam Constitutional:  General: Dominic is not in acute distress.    Appearance: Dominic is obese.  HENT:     Head: Normocephalic and atraumatic.  Eyes:     General: No scleral icterus. Cardiovascular:     Rate and Rhythm: Normal rate  and regular rhythm.     Heart sounds: Normal heart sounds.  Pulmonary:     Effort: Pulmonary effort is normal. No respiratory distress.     Breath sounds: No wheezing.  Abdominal:     General: Bowel sounds are normal. There is no distension.     Palpations: Abdomen is soft.  Musculoskeletal:        General: No deformity. Normal range of motion.     Cervical back: Normal range of motion and neck supple.  Skin:    General: Skin is warm and dry.     Findings: No erythema or rash.  Neurological:     Mental Status: Dominic is alert and oriented to person, place, and time. Mental status is at baseline.     Cranial Nerves: No cranial nerve deficit.     Coordination: Coordination normal.  Psychiatric:        Mood and Affect: Mood normal.    LABORATORY DATA:  I have reviewed the data as listed Lab Results  Component Value Date   WBC 5.4 01/07/2021   HGB 12.8 (L) 01/07/2021   HCT 39.0 01/07/2021   MCV 86.7 01/07/2021   PLT 242 01/07/2021   Recent Labs    04/10/20 1021 04/25/20 0942 05/07/20 0933 06/03/20 0815 06/21/20 0908 06/28/20 1019 11/27/20 0907 12/24/20 0935 01/07/21 0839  NA  --   --  136   < > 135   < > 134* 129* 134*  K  --   --  4.4   < > 4.8   < > 3.8 3.8 4.2  CL  --   --  98   < > 95*   < > 99 90* 99  CO2  --   --  21   < > 22   < > 26 28 26   GLUCOSE  --   --  175*   < > 202*   < > 175* 272* 178*  BUN 18  --  12   < > 11   < > 19 16 17   CREATININE 0.91  --  0.86   < > 0.80   < > 0.89 0.79 0.80  CALCIUM  --   --  9.2   < > 9.2   < > 8.8* 9.1 9.1  GFRNONAA 98  --  101   < > 104   < > >60 >60 >60  GFRAA 113  --  117  --  120  --   --   --   --   PROT  --  6.5  --    < >  --    < > 7.3 7.5 7.2  ALBUMIN  --  3.1*  --    < >  --    < > 3.7 3.2* 3.7  AST  --  18  --    < >  --    < > 26 51* 27  ALT  --  17  --    < >  --    < > 19 37 20  ALKPHOS  --  215*  --    < >  --    < > 91 166* 114  BILITOT  --  0.7  --    < >  --    < > 0.9 0.6 0.6  BILIDIR  --  0.47*  --   --    --   --   --   --   --    < > = values in this interval not displayed.    Iron/TIBC/Ferritin/ %Sat No results found for: IRON, TIBC, FERRITIN, IRONPCTSAT    RADIOGRAPHIC STUDIES: I have personally reviewed the radiological images as listed and agreed with the findings in the report. No results found. [facility] CT report was reviewed.    ASSESSMENT & PLAN:  1. Rectal cancer metastasized to liver ([place])   2. Encounter for antineoplastic chemotherapy   3. Neuroendocrine carcinoma of pancreas ([place])   4. Neuropathy due to chemotherapeutic drug ([place])   5. Goals of care, counseling/discussion   Cancer Staging Neuroendocrine carcinoma of pancreas ([place]) Staging form: Exocrine Pancreas, AJCC 8th Edition - Clinical: Stage IB (cT2, cN0, cM0) - Signed by Earlie Server, MD on 09/11/2020  Rectal cancer metastasized to liver [name]) Staging form: Colon and Rectum, AJCC 8th Edition - Clinical stage from 06/03/2020: Stage Unknown (cTX, cNX, pM1) - Signed by Earlie Server, MD on 06/03/2020   #Stage IV rectal cancer with liver only metastasis Progressed on second line FOLFIRI /bevacizumab.  I discussed plan with [facility] GI Oncologist Dr.Jia and GI surgeon [facility]ic [name] recommend hepatic HAI pump placement, currently scheduled on 8/3.  Will hold Bevacizumab. Dominic [name] recommends him to receive another cycle of FOLFIRI while waiting Bevacizumab to be washed out and planning for surgery.  Labs are reviewed and discussed with patient. Proceed with FOLFIRI today.    #Pancreatic well differentiated neuroendocrine carcinoma Observation.  #GERD, discontinue famotidine.  Switch to omeprazole 20 mg twice daily. # Chemotherapy induced neuropathy. Grade 1  Dominic reports taking gabapentin 100 mg twice daily.  Recommend patient to increase to 200 mg 2-3 times daily. # Hypokalemia, K 4.2 , ok to stop potassium chloride 10 mEq daily.   All questions were answered. The patient knows to call the clinic with any problems  questions or concerns.  cc Burnard Hawthorne, FNP    Return of visit: 2- 3 weeks for MD follow up.   Earlie Server, MD, PhD Hematology Oncology [name] Pager- [phone] 01/07/2021

## 2021-01-07 NOTE — Patient Instructions (Signed)
[facility]  Discharge Instructions: Thank you for choosing [facility] to provide your oncology and hematology care.  If you have a lab appointment with the [facility], please go directly to the [facility] and check in at the registration area.  Wear comfortable clothing and clothing appropriate for easy access to any Portacath or PICC line.   We strive to give you quality time with your provider. You may need to reschedule your appointment if you arrive late (15 or more minutes).  Arriving late affects you and other patients whose appointments are after yours.  Also, if you miss three or more appointments without notifying the office, you may be dismissed from the clinic at the provider's discretion.      For prescription refill requests, have your pharmacy contact our office and allow 72 hours for refills to be completed.    Today you received the following chemotherapy and/or immunotherapy agents Irinotecan, Leucovorin and Adrucil       To help prevent nausea and vomiting after your treatment, we encourage you to take your nausea medication as directed.  BELOW ARE SYMPTOMS THAT SHOULD BE REPORTED IMMEDIATELY: *FEVER GREATER THAN 100.4 F (38 C) OR HIGHER *CHILLS OR SWEATING *NAUSEA AND VOMITING THAT IS NOT CONTROLLED WITH YOUR NAUSEA MEDICATION *UNUSUAL SHORTNESS OF BREATH *UNUSUAL BRUISING OR BLEEDING *URINARY PROBLEMS (pain or burning when urinating, or frequent urination) *BOWEL PROBLEMS (unusual diarrhea, constipation, pain near the anus) TENDERNESS IN MOUTH AND THROAT WITH OR WITHOUT PRESENCE OF ULCERS (sore throat, sores in mouth, or a toothache) UNUSUAL RASH, SWELLING OR PAIN  UNUSUAL VAGINAL DISCHARGE OR ITCHING   Items with * indicate a potential emergency and should be followed up as soon as possible or go to the Emergency Department if any problems should occur.  Please show the CHEMOTHERAPY ALERT CARD or  IMMUNOTHERAPY ALERT CARD at check-in to the Emergency Department and triage nurse.  Should you have questions after your visit or need to cancel or reschedule your appointment, please contact [facility]  [phone] and follow the prompts.  Office hours are 8:00 a.m. to 4:30 p.m. Monday - Friday. Please note that voicemails left after 4:00 p.m. may not be returned until the following business day.  We are closed weekends and major holidays. You have access to a nurse at all times for urgent questions. Please call the main number to the clinic [phone] and follow the prompts.  For any non-urgent questions, you may also contact your provider using MyChart. We now offer e-Visits for anyone 46 and older to request care online for non-urgent symptoms. For details visit [email or web address].   Also download the MyChart app! Go to the app store, search "MyChart", open the app, select [facility], and log in with your MyChart username and password.  Due to Covid, a mask is required upon entering the hospital/clinic. If you do not have a mask, one will be given to you upon arrival. For doctor visits, patients may have 1 support person aged 73 or older with them. For treatment visits, patients cannot have anyone with them due to current Covid guidelines and our immunocompromised population.

## 2021-01-08 ENCOUNTER — Ambulatory Visit: Payer: Managed Care, Other (non HMO)

## 2021-01-08 LAB — CEA: CEA: 781 ng/mL — ABNORMAL HIGH (ref 0.0–4.7)

## 2021-01-09 ENCOUNTER — Inpatient Hospital Stay: Payer: Managed Care, Other (non HMO)

## 2021-01-09 DIAGNOSIS — C20 Malignant neoplasm of rectum: Secondary | ICD-10-CM

## 2021-01-09 DIAGNOSIS — Z5111 Encounter for antineoplastic chemotherapy: Secondary | ICD-10-CM | POA: Diagnosis not present

## 2021-01-09 MED ORDER — HEPARIN SOD (PORK) LOCK FLUSH 100 UNIT/ML IV SOLN
500.0000 [IU] | Freq: Once | INTRAVENOUS | Status: AC | PRN
  Administered 2021-01-09: 500 [IU]
  Filled 2021-01-09: qty 5

## 2021-01-09 MED ORDER — SODIUM CHLORIDE 0.9% FLUSH
10.0000 mL | INTRAVENOUS | Status: DC | PRN
  Administered 2021-01-09: 10 mL
  Filled 2021-01-09: qty 10

## 2021-01-21 ENCOUNTER — Other Ambulatory Visit: Payer: Self-pay | Admitting: Oncology

## 2021-01-24 ENCOUNTER — Ambulatory Visit: Payer: Managed Care, Other (non HMO) | Attending: Critical Care Medicine

## 2021-01-24 DIAGNOSIS — Z20822 Contact with and (suspected) exposure to covid-19: Secondary | ICD-10-CM

## 2021-01-25 LAB — SARS-COV-2, NAA 2 DAY TAT

## 2021-01-25 LAB — NOVEL CORONAVIRUS, NAA: SARS-CoV-2, NAA: DETECTED — AB

## 2021-01-27 ENCOUNTER — Inpatient Hospital Stay: Payer: Managed Care, Other (non HMO)

## 2021-01-27 ENCOUNTER — Inpatient Hospital Stay: Payer: Managed Care, Other (non HMO) | Admitting: Oncology

## 2021-01-27 ENCOUNTER — Telehealth: Payer: Self-pay | Admitting: Oncology

## 2021-01-27 NOTE — Telephone Encounter (Signed)
Patient called to report he tested positive for COVID today and needs to cancel his appointments. When asked if he would like to reschedule--patient stated that he was transferring care over to [facility].

## 2021-01-28 ENCOUNTER — Encounter: Payer: Self-pay | Admitting: Family

## 2021-01-28 ENCOUNTER — Telehealth (INDEPENDENT_AMBULATORY_CARE_PROVIDER_SITE_OTHER): Payer: Managed Care, Other (non HMO) | Admitting: Family

## 2021-01-28 VITALS — HR 93 | Temp 97.8°F | Ht 69.02 in | Wt 238.7 lb

## 2021-01-28 DIAGNOSIS — C20 Malignant neoplasm of rectum: Secondary | ICD-10-CM | POA: Diagnosis not present

## 2021-01-28 DIAGNOSIS — U071 COVID-19: Secondary | ICD-10-CM | POA: Diagnosis not present

## 2021-01-28 DIAGNOSIS — C787 Secondary malignant neoplasm of liver and intrahepatic bile duct: Secondary | ICD-10-CM | POA: Diagnosis not present

## 2021-01-28 DIAGNOSIS — R059 Cough, unspecified: Secondary | ICD-10-CM | POA: Diagnosis not present

## 2021-01-28 MED ORDER — NIRMATRELVIR/RITONAVIR (PAXLOVID) TABLET (RENAL DOSING): 2.0000 | ORAL_TABLET | Freq: Two times a day (BID) | ORAL | 0 refills | Status: AC

## 2021-01-28 NOTE — Progress Notes (Signed)
Virtual Visit via Video   I connected with patient on 01/28/21 at  2:45 PM EDT by a video enabled telemedicine application and verified that I am speaking with the correct person using two identifiers.  Location patient: Home Location provider: [employer], Office Persons participating in the virtual visit: Patient, Provider, Martin Majestic   I discussed the limitations of evaluation and management by telemedicine and the availability of in person appointments. The patient expressed understanding and agreed to proceed.  Subjective:   HPI:   50 year old male presents today via video visit with c/o cough, weakness, diarrhea after testing positive on 01/23/21 for COVID-19 by rapid antigen test. Believes he may have contracted it from his partner who was sick but did not test positive. Patient is concerned as he is a stage 4 rectal cancer patient and has surgery scheduled early August at [facility]. Would like to consider antiviral therapy.   ROS:   See pertinent positives and negatives per HPI.  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   Rectal cancer metastasized to liver ([place]) 05/27/2020   Goals of care, counseling/discussion 05/19/2020   Dysuria 05/07/2020   Acute prostatitis 05/07/2020   Liver disease 05/02/2020   Bloating 03/13/2020   Hepatic steatosis 08/14/2019   Unstable angina ([place]) 08/11/2019   H/O  supraventricular tachycardia 08/11/2019   Morbid obesity ([place]) 08/11/2019   COVID-19 07/18/2019   Type 2 diabetes mellitus without complication, with long-term current use of insulin ([place]) 02/27/2019   SVT (supraventricular tachycardia) ([place]) 02/27/2019   Hypertension 02/27/2019   Psoriatic arthritis ([place]) 02/27/2019    Social History   Tobacco Use   Smoking status: Never   Smokeless tobacco: Never  Substance Use Topics   Alcohol use: Yes    Alcohol/week: 1.0 standard drink    Types: 1 Glasses of wine per week    Comment: very occasional    Current Outpatient Medications:    ALPHA LIPOIC ACID PO, Take 1 capsule by mouth daily., Disp: , Rfl:    ALPRAZolam (XANAX) 0.5 MG tablet, Take half tablet to one tablet by mouth as needed daily for insomnia, anxiety., Disp: 30 tablet, Rfl: 1   amLODipine (NORVASC) 10 MG tablet, TAKE 1 TABLET BY MOUTH  DAILY, Disp: 90 tablet, Rfl: 3   aspirin EC 81 MG tablet, Take 1 tablet (81 mg total) by mouth daily. Swallow whole., Disp: 30 tablet, Rfl: 11   B Complex Vitamins (VITAMIN B COMPLEX PO), Take 1 tablet by mouth daily., Disp: , Rfl:    Barberry-Oreg Grape-Goldenseal (BERBERINE COMPLEX PO), Take 1 tablet by mouth daily., Disp: , Rfl:    benzonatate (TESSALON) 100 MG capsule, Take 1 capsule (100 mg total) by mouth 2 (two) times daily as needed for cough., Disp: 20 capsule, Rfl: 0   CONTOUR NEXT TEST test strip, USE 1 STRIP TO TEST BLOOD  SUGAR TWICE DAILY, Disp: 200 strip, Rfl: 3   gabapentin (NEURONTIN) 100 MG capsule, Take 2  capsules (200 mg total) by mouth 3 (three) times daily., Disp: 540 capsule, Rfl: 0   hydrochlorothiazide (HYDRODIURIL) 25 MG tablet, Take 25 mg by mouth daily., Disp: , Rfl:    insulin glargine (LANTUS SOLOSTAR) 100 UNIT/ML Solostar Pen, Inject 64 Units into the skin daily., Disp: 60 mL, Rfl: 3   insulin lispro (HUMALOG KWIKPEN) 100 UNIT/ML KwikPen, Inject 18 Units into the skin 3 (three) times daily. Take with meals. Hold if  premeal glucose <100, Disp: 45 mL, Rfl: 5   Insulin Pen Needle (PEN NEEDLES) 32G X 6 MM MISC, Used to give insulin injections 4 times daily., Disp: 100 each, Rfl: 10   lidocaine-prilocaine (EMLA) cream, Apply to affected area once, Disp: 30 g, Rfl: 3   [name], Take 5 mg by mouth at bedtime as needed., Disp: , Rfl:    metFORMIN (GLUCOPHAGE XR) 500 MG 24 hr tablet, Take 4 tablets (2,000 mg total) by mouth every evening., Disp: 120 tablet, Rfl: 3   metoprolol succinate (TOPROL-XL) 100 MG 24 hr tablet, TAKE 1 TABLET BY MOUTH  DAILY WITH OR IMMEDIATELY  FOLLOWING A MEAL, Disp: 90 tablet, Rfl: 3   Microlet Lancets MISC, Used to check blood sugars three times a day., Disp: 200 each, Rfl: 5   nirmatrelvir/ritonavir EUA, renal dosing, (PAXLOVID) TABS, Take 2 tablets by mouth 2 (two) times daily for 5 days. (Take nirmatrelvir 150 mg one tablet twice daily for 5 days and ritonavir 100 mg one tablet twice daily for 5 days) Patient GFR is normal, Disp: 20 tablet, Rfl: 0   Omega-3 Fatty Acids (FISH OIL PO), Take 800 mg by mouth daily., Disp: , Rfl:    telmisartan (MICARDIS) 40 MG tablet, Take 1 tablet (40 mg total) by mouth daily. PLEASE CALL OFFICE TO SCHEDULE FOLLOW UP APPOINTMENT FOR REFILLS., Disp: 90 tablet, Rfl: 1   zinc gluconate 50 MG tablet, Take 50 mg by mouth daily., Disp: , Rfl:    albuterol (VENTOLIN HFA) 108 (90 Base) MCG/ACT inhaler, Inhale 2 puffs into the lungs every 6 (six) hours as needed for wheezing or shortness of breath. (Patient not taking: No sig reported), Disp: 8 g, Rfl: 0   Continuous Blood Gluc Sensor (FREESTYLE LIBRE 2 SENSOR) MISC, Scan glucose at least QID (Patient not taking: No sig reported), Disp: 2 each, Rfl: 11   fluticasone-salmeterol (ADVAIR) 100-50 MCG/ACT AEPB, Inhale 1 puff into the lungs 2 (two) times daily. Rinse mouth out after each use, swish with water and spit out after each use. (Patient not taking: No sig reported), Disp: 1 each, Rfl: 0   lactulose (CHRONULAC)  10 GM/15ML solution, Take 15 mLs (10 g total) by mouth 2 (two) times daily as needed for moderate constipation or severe constipation. (Patient not taking: No sig reported), Disp: 236 mL, Rfl: 0   loperamide (IMODIUM A-D) 2 MG tablet, Take 1 tablet (2 mg total) by mouth See admin instructions. Take 2 tabs at diarrhea onset , then 1 tab after every loose bowel movement. Maximum 8 tablets within 24 hour period of time (Patient not taking: No sig reported), Disp: 100 tablet, Rfl: 1   magic mouthwash w/lidocaine SOLN, Take 5 mLs by mouth 4 (four) times daily as needed for mouth pain. 80 ml viscous lidocaine 2%, 80 ml Mylanta, 80 ml Diphenhydramine 12.5 mg/5 ml Elixir, 80 ml Nystatin 100,000 Unit suspension, 80 ml Prednisolone 15 mg/68ml, 80 ml Distilled Water.  Sig: Swish/Swallow 5-10 ml four times a day as needed. (Patient not taking:  No sig reported), Disp: 480 mL, Rfl: 3   omeprazole (PRILOSEC) 20 MG capsule, Take 1 capsule (20 mg total) by mouth in the morning and at bedtime. (Patient not taking: No sig reported), Disp: 180 capsule, Rfl: 0   ondansetron (ZOFRAN ODT) 4 MG disintegrating tablet, Take 1 tablet (4 mg total) by mouth every 8 (eight) hours as needed for nausea or vomiting. (Patient not taking: No sig reported), Disp: 30 tablet, Rfl: 2   ondansetron (ZOFRAN) 8 MG tablet, Take 1 tablet (8 mg total) by mouth 2 (two) times daily as needed for refractory nausea / vomiting. Start on day 3 after chemotherapy. (Patient not taking: No sig reported), Disp: 30 tablet, Rfl: 1   polyethylene glycol powder (GLYCOLAX/MIRALAX) 17 GM/SCOOP powder, Take by mouth daily. 1/2 capful QD (Patient not taking: No sig reported), Disp: , Rfl:    prochlorperazine (COMPAZINE) 10 MG tablet, Take 1 tablet (10 mg total) by mouth every 6 (six) hours as needed (Nausea or vomiting). (Patient not taking: No sig reported), Disp: 30 tablet, Rfl: 1   senna (SENOKOT) 8.6 MG TABS tablet, Take 2 tablets (17.2 mg total) by mouth daily.  (Patient not taking: No sig reported), Disp: 120 tablet, Rfl: 0   tamsulosin (FLOMAX) 0.4 MG CAPS capsule, Take 1 capsule (0.4 mg total) by mouth daily. (Patient not taking: No sig reported), Disp: 30 capsule, Rfl: 1  Current Facility-Administered Medications:    0.9 %  sodium chloride infusion, 500 mL, Intravenous, Once, Danis, Estill Cotta III, MD  No Known Allergies  Objective:   Pulse 93   Temp 97.8 F (36.6 C)   Ht 5' 9.02" (1.753 m)   Wt 238 lb 11.2 oz (108.3 kg)   SpO2 98%   BMI 35.23 kg/m   Patient is well-developed, well-nourished in no acute distress.  Resting comfortably at home.  Head is normocephalic, atraumatic.  No labored breathing.  Speech is clear and coherent with logical content.  Patient is alert and oriented at baseline.    Assessment and Plan:   Kalvin was seen today for covid positive.  Diagnoses and all orders for this visit:  COVID-19  Rectal cancer metastasized to liver ([place])  Cough  Other orders -     nirmatrelvir/ritonavir EUA, renal dosing, (PAXLOVID) TABS; Take 2 tablets by mouth 2 (two) times daily for 5 days. (Take nirmatrelvir 150 mg one tablet twice daily for 5 days and ritonavir 100 mg one tablet twice daily for 5 days) Patient GFR is normal  Discussed options for treatment given this is day 5 of symptoms. Scheduling an appointment for IV infusion not an option at this point. We have elected to go with renal dose Paxlovid (although renal function is normal), we will use cautiously as he has some mets to the liver. Call the office if symptoms worsen or persist. Recheck as scheduled and sooner as needed.    Kennyth Arnold, FNP 01/28/2021

## 2021-01-31 ENCOUNTER — Ambulatory Visit: Payer: Managed Care, Other (non HMO) | Attending: Internal Medicine

## 2021-01-31 DIAGNOSIS — Z20822 Contact with and (suspected) exposure to covid-19: Secondary | ICD-10-CM

## 2021-02-01 LAB — NOVEL CORONAVIRUS, NAA: SARS-CoV-2, NAA: NOT DETECTED

## 2021-02-01 LAB — SARS-COV-2, NAA 2 DAY TAT

## 2021-02-01 LAB — SPECIMEN STATUS REPORT

## 2021-02-03 ENCOUNTER — Telehealth: Payer: Self-pay | Admitting: Family

## 2021-02-03 ENCOUNTER — Telehealth: Payer: Managed Care, Other (non HMO)

## 2021-02-03 ENCOUNTER — Ambulatory Visit: Payer: Managed Care, Other (non HMO) | Admitting: Pharmacist

## 2021-02-03 ENCOUNTER — Other Ambulatory Visit: Payer: Self-pay | Admitting: Oncology

## 2021-02-03 DIAGNOSIS — E119 Type 2 diabetes mellitus without complications: Secondary | ICD-10-CM

## 2021-02-03 DIAGNOSIS — I10 Essential (primary) hypertension: Secondary | ICD-10-CM

## 2021-02-03 NOTE — Telephone Encounter (Signed)
Thank you catie for seeing him   Judson Roch,  Call pt Very much agree with catie's consult and commend him on his improvement of blood sugar. Very impressed  Agree with catie's recommendation below regarding SUPPER humalog, breakfast and lunch will not increase.but stay 18-20 units ; please change prescription so that it is accurate  Given elevated post-supper readings, recommend increasing supper Humalog dose to 22-24 units, pending anticipated carbohydrate content of meals.   Reiterate that if he is low carb or small meal, he can administer humalog 18-20 units with supper.

## 2021-02-03 NOTE — Patient Instructions (Addendum)
Visit Information   Goals Addressed               This Visit's Progress     Patient Stated     Medication Monitoring (pt-stated)        Patient Goals/Self-Care Activities Over the next 90 days, patient will:  - take medications as prescribed - check glucose at least three times daily using CGM, document, and provide at future appointments - Collaborate with interdisciplinary team.          Patient verbalizes understanding of instructions provided today and agrees to view in [place].   Plan: Telephone follow up appointment with care management team member scheduled for:  ~ 6 weeks   Catie Darnelle Maffucci, PharmD, [place], [place] Clinical Pharmacist [employer] [facility] [employer] [phone]

## 2021-02-03 NOTE — Chronic Care Management (AMB) (Signed)
Care Management   Pharmacy Note  02/03/2021 Name: Dominic [name] MRN: [id number] DOB: 03/09/1971  Subjective: Dominic [name] is a 50 y.o. year old male who is a primary care patient of Burnard Hawthorne, FNP. The Care Management team was consulted for assistance with care management and care coordination needs.    Engaged with patient by telephone for follow up visit in response to provider referral for pharmacy case management and/or care coordination services.   The patient was given information about Care Management services today including:  Care Management services includes personalized support from designated clinical staff supervised by the patient's primary care provider, including individualized plan of care and coordination with other care providers. 24/7 contact phone numbers for assistance for urgent and routine care needs. The patient may stop case management services at any time by phone call to the office staff.  Patient agreed to services and consent obtained.  Assessment:  Review of patient status, including review of consultants reports, laboratory and other test data, was performed as part of comprehensive evaluation and provision of chronic care management services.   SDOH (Social Determinants of Health) assessments and interventions performed:  SDOH Interventions    Flowsheet Row Most Recent Value  SDOH Interventions   Financial Strain Interventions Intervention Not Indicated        Objective:  Lab Results  Component Value Date   CREATININE 0.80 01/07/2021   CREATININE 0.79 12/24/2020   CREATININE 0.89 11/27/2020    Lab Results  Component Value Date   HGBA1C 9.4 (A) 11/29/2020       Component Value Date/Time   CHOL 226 (H) 03/07/2020 0902   TRIG 123 03/07/2020 0902   HDL 33 (L) 03/07/2020 0902   CHOLHDL 6.8 (H) 03/07/2020 0902   LDLCALC 170 (H) 03/07/2020 0902      Clinical ASCVD: No  The 10-year ASCVD risk score [name], et al.,  2013) is: 17.8%   Values used to calculate the score:     Age: 42 years     Sex: Male     Is Non-Hispanic African American: No     Diabetic: Yes     Tobacco smoker: No     Systolic Blood Pressure: [id number] mmHg     Is BP treated: Yes     HDL Cholesterol: 33 mg/dL     Total Cholesterol: 226 mg/dL     BP Readings from Last 3 Encounters:  01/07/21 (!) 143/79  12/26/20 (!) 147/81  12/24/20 (!) 150/72         Care Plan  No Known Allergies  Medications Reviewed Today     Reviewed by De Hollingshead, RPH-CPP (Pharmacist) on 02/03/21 at 1140  Med List Status: <None>   Medication Order Taking? Sig Documenting Provider Last Dose Status Informant  0.9 %  sodium chloride infusion [id number]   Danis, Estill Cotta III, MD  Active   albuterol (VENTOLIN HFA) 108 (90 Base) MCG/ACT inhaler [id number] No Inhale 2 puffs into the lungs every 6 (six) hours as needed for wheezing or shortness of breath.  Patient not taking: No sig reported   Flinchum, Kelby Aline, FNP Not Taking Active   ALPHA LIPOIC ACID PO [id number] Yes Take 1 capsule by mouth daily. [provider] Taking Active   ALPRAZolam Duanne Moron) 0.5 MG tablet [id number] Yes Take half tablet to one tablet by mouth as needed daily for insomnia, anxiety. Burnard Hawthorne, FNP Taking Active   amLODipine (NORVASC) 10 MG tablet [id number]  Yes TAKE 1 TABLET BY MOUTH  DAILY End, Harrell Gave, MD Taking Active   aspirin EC 81 MG tablet [id number] Yes Take 1 tablet (81 mg total) by mouth daily. Swallow whole. Earlie Server, MD Taking Active   B Complex Vitamins (VITAMIN B COMPLEX PO) [id number] Yes Take 1 tablet by mouth daily. [provider] Taking Active Self  Barberry-Oreg Grape-Goldenseal (BERBERINE COMPLEX PO) [id number] Yes Take 1 tablet by mouth daily. [provider] Taking Active Self  benzonatate (TESSALON) 100 MG capsule [id number] No Take 1 capsule (100 mg total) by mouth 2 (two) times daily as needed for cough.  Patient not  taking: Reported on 02/03/2021   Doreen Beam, FNP Not Taking Active   Continuous Blood Gluc Sensor (FREESTYLE LIBRE 2 SENSOR) [place] [id number]  Scan glucose at least QID  Patient not taking: No sig reported   Burnard Hawthorne, FNP  Active   CONTOUR NEXT TEST test strip [id number]  USE 1 STRIP TO TEST BLOOD  SUGAR TWICE DAILY Burnard Hawthorne, FNP  Active   fluticasone-salmeterol (ADVAIR) 100-50 MCG/ACT AEPB [id number] No Inhale 1 puff into the lungs 2 (two) times daily. Rinse mouth out after each use, swish with water and spit out after each use.  Patient not taking: No sig reported   Flinchum, Kelby Aline, FNP Not Taking Active   gabapentin (NEURONTIN) 100 MG capsule [id number] Yes Take 2 capsules (200 mg total) by mouth 3 (three) times daily. Earlie Server, MD Taking Active   hydrochlorothiazide (HYDRODIURIL) 25 MG tablet [id number] Yes Take 25 mg by mouth daily. [provider] Taking Active   insulin glargine (LANTUS SOLOSTAR) 100 UNIT/ML Solostar Pen [id number] Yes Inject 64 Units into the skin daily. Burnard Hawthorne, FNP Taking Active   insulin lispro (HUMALOG KWIKPEN) 100 UNIT/ML KwikPen [id number] Yes Inject 18 Units into the skin 3 (three) times daily. Take with meals. Hold if premeal glucose <100 Burnard Hawthorne, FNP Taking Active   Insulin Pen Needle (PEN NEEDLES) 32G X 6 MM MISC [id number] Yes Used to give insulin injections 4 times daily. Burnard Hawthorne, FNP Taking Active   lactulose (CHRONULAC) 10 GM/15ML solution [id number] No Take 15 mLs (10 g total) by mouth 2 (two) times daily as needed for moderate constipation or severe constipation.  Patient not taking: No sig reported   Earlie Server, MD Not Taking Active   lidocaine-prilocaine (EMLA) cream [id number] No Apply to affected area once  Patient not taking: Reported on 02/03/2021   Earlie Server, MD Not Taking Active   loperamide (IMODIUM A-D) 2 MG tablet [id number] No Take 1 tablet (2 mg total) by mouth See admin instructions.  Take 2 tabs at diarrhea onset , then 1 tab after every loose bowel movement. Maximum 8 tablets within 24 hour period of time  Patient not taking: No sig reported   Earlie Server, MD Not Taking Active   magic mouthwash w/lidocaine SOLN [id number] No Take 5 mLs by mouth 4 (four) times daily as needed for mouth pain. 80 ml viscous lidocaine 2%, 80 ml Mylanta, 80 ml Diphenhydramine 12.5 mg/5 ml Elixir, 80 ml Nystatin 100,000 Unit suspension, 80 ml Prednisolone 15 mg/82m, 80 ml Distilled Water.  Sig: Swish/Swallow 5-10 ml four times a day as needed.  Patient not taking: No sig reported   YEarlie Server MD Not Taking Active   [name] [id number] Take 5 mg by mouth at bedtime as needed. [provider] Taking Active   metFORMIN (GLUCOPHAGE XR) 500  MG 24 hr tablet [id number] Yes Take 4 tablets (2,000 mg total) by mouth every evening. Burnard Hawthorne, FNP Taking Active            Med Note [facility], IV[facility] A   Mon Nov 25, 2020 11:36 AM) 2 tablets daily   metoprolol succinate (TOPROL-XL) 100 MG 24 hr tablet [id number] Yes TAKE 1 TABLET BY MOUTH  DAILY WITH OR IMMEDIATELY  FOLLOWING A MEAL Leone Haven, MD Taking Active   Microlet Lancets MISC [id number]  Used to check blood sugars three times a day. Burnard Hawthorne, FNP  Active   Omega-3 Fatty Acids (FISH OIL PO) [id number] Yes Take 800 mg by mouth daily. [provider] Taking Active Self  omeprazole (PRILOSEC) 20 MG capsule [id number] Yes Take 1 capsule (20 mg total) by mouth in the morning and at bedtime. Earlie Server, MD Taking Active   ondansetron (ZOFRAN ODT) 4 MG disintegrating tablet [id number] No Take 1 tablet (4 mg total) by mouth every 8 (eight) hours as needed for nausea or vomiting.  Patient not taking: No sig reported   Burnard Hawthorne, FNP Not Taking Active   ondansetron (ZOFRAN) 8 MG tablet [id number] No Take 1 tablet (8 mg total) by mouth 2 (two) times daily as needed for refractory nausea / vomiting. Start on day 3 after  chemotherapy.  Patient not taking: No sig reported   Earlie Server, MD Not Taking Active   polyethylene glycol powder (GLYCOLAX/MIRALAX) 17 GM/SCOOP powder [id number] No Take by mouth daily. 1/2 capful QD  Patient not taking: No sig reported   [provider] Not Taking Active   prochlorperazine (COMPAZINE) 10 MG tablet [id number] No Take 1 tablet (10 mg total) by mouth every 6 (six) hours as needed (Nausea or vomiting).  Patient not taking: No sig reported   Earlie Server, MD Not Taking Active   senna (SENOKOT) 8.6 MG TABS tablet [id number] No Take 2 tablets (17.2 mg total) by mouth daily.  Patient not taking: No sig reported   Earlie Server, MD Not Taking Active   tamsulosin (FLOMAX) 0.4 MG CAPS capsule [id number] No Take 1 capsule (0.4 mg total) by mouth daily.  Patient not taking: No sig reported   Stoioff, Ronda Fairly, MD Not Taking Active   telmisartan (MICARDIS) 40 MG tablet [id number] Yes Take 1 tablet (40 mg total) by mouth daily. PLEASE CALL OFFICE TO SCHEDULE FOLLOW UP APPOINTMENT FOR REFILLS. Burnard Hawthorne, FNP Taking Active   zinc gluconate 50 MG tablet [id number] Yes Take 50 mg by mouth daily. [provider] Taking Active             Patient Active Problem List   Diagnosis Date Noted   GERD (gastroesophageal reflux disease) 12/16/2020   Hypertriglyceridemia 12/16/2020   Hyperosmolar coma due to type 2 diabetes mellitus ([place]) 12/16/2020   Tachycardia 09/11/2020   Nausea and vomiting 09/11/2020   Genetic testing 07/23/2020   Family history of esophageal cancer    Family history of prostate cancer    Insomnia 06/14/2020   Neuropathy due to chemotherapeutic drug ([place]) 06/11/2020   Uncontrolled diabetes mellitus ([place]) 06/11/2020   Encounter for antineoplastic chemotherapy 06/03/2020   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   Rectal cancer metastasized to liver ([place]) 05/27/2020   Goals of care, counseling/discussion 05/19/2020   Dysuria 05/07/2020    Acute prostatitis 05/07/2020   Liver disease 05/02/2020   Bloating 03/13/2020   Hepatic steatosis 08/14/2019   Unstable angina ([place]) 08/11/2019  H/O supraventricular tachycardia 08/11/2019   Morbid obesity ([place]) 08/11/2019   COVID-19 07/18/2019   Type 2 diabetes mellitus without complication, with long-term current use of insulin ([place]) 02/27/2019   SVT (supraventricular tachycardia) ([place]) 02/27/2019   Hypertension 02/27/2019   Psoriatic arthritis ([place]) 02/27/2019    Conditions to be addressed/monitored: HTN, HLD, and DMII  Care Plan : Medication Management  Updates made by De Hollingshead, RPH-CPP since 02/03/2021 12:00 AM     Problem: Diabetes, Cancer, HTN w/ SVT      Long-Range Goal: Disease Progression Prevention   This Visit's Progress: On track  Recent Progress: On track  Priority: High  Note:   Current Barriers:  Unable to independently afford treatment regimen Unable to achieve control of diabetes  Complex patient with recent cancer diagnosis  Pharmacist Clinical Goal(s):  Over the next 90 days, patient will verbalize ability to afford treatment regimen. Over the next 90 days, patient will adhere to prescribed medication regimen  Interventions: 1:1 collaboration with Burnard Hawthorne, FNP regarding development and update of comprehensive plan of care as evidenced by provider attestation and co-signature Inter-disciplinary care team collaboration (see longitudinal plan of care) Comprehensive medication review performed; medication list updated in electronic medical record  Health Maintenance: S/p COVID. Received tx w/ Paxlovid starting 7/19. Back at work today. Reports continued SOB, fatigue, but improving.   Diabetes: Uncontrolled but improving; current treatment: metformin XR 1000 mg BID (patient self-increased), Lantus 64 units daily, Humalog 18-20 units TID w/meals  Denies hypoglycemic episodes Current glucose readings: using Libre 2 CGM Date of  Download: 7/12-7/25/22 % Time CGM is active: 94% Average Glucose: 160 mg/dL Glucose Management Indicator: 7.1  Glucose Variability: 24.4 (goal <36%) Time in Goal:  - Time in range 70-180: 72% - Time above range: 28% - Time below range: 0% Observed patterns: post supper spikes highest  Counseled on Rule of 15 with low blood sugars.  Given elevated post-supper readings, recommend increasing supper Humalog dose to 22-24 units, pending anticipated carbohydrate content of meals. Will discuss w/ PCP.  Hypertension w/ SVT Appropriately controlled given comorbidities; current treatment: amlodipine 10 mg, HCTZ 25 mg daily, metoprolol succinate 100 mg daily, telmisartan 40 mg daily   Previously encouraged to continue current therapy at this time and collaboration with cardiology  Malignancy: Followed by Dr. Tasia Catchings, Dr. Hyman H[facility] at [facility], upcoming procedure 8/3. Symptomatic management includes: alprazolam 0.25-0.5 mg PRN anxiety; gabapentin 100 mg TID, lactulose 10 g BID PRN severe constipation, ondansetron ODT PRN nausea, potassium 10 mEq daily, prochlorperazine 10 mg PRN nausea, senna 8.6 mg PRN moderate constipation Recommended to continue current collaboration with oncology at this time  BPH: Controlled per patient report; current regimen: tamsulosin 0.4 mg daily (not taking); follows w/ Dr. Diamantina [place] Continue current regimen along with urology collaboration at this time.  Patient Goals/Self-Care Activities Over the next 90 days, patient will:  - take medications as prescribed check glucose at least three times daily using CGM, document, and provide at future appointments Collaborate with interdisciplinary team   Follow Up Plan: Telephone follow up appointment with care management team member scheduled for: ~6 weeks     Medication Assistance:  None required.  Patient affirms current coverage meets needs.  Follow Up:  Patient agrees to Care Plan and Follow-up.  Plan: Telephone follow up  appointment with care management team member scheduled for:  ~ 6 weeks  Catie Darnelle Maffucci, PharmD, [place], [place] Clinical Pharmacist [employer] [facility] [employer] [phone]

## 2021-02-04 ENCOUNTER — Other Ambulatory Visit: Payer: Self-pay

## 2021-02-04 DIAGNOSIS — E119 Type 2 diabetes mellitus without complications: Secondary | ICD-10-CM

## 2021-02-04 MED ORDER — INSULIN LISPRO (1 UNIT DIAL) 100 UNIT/ML (KWIKPEN): PEN_INJECTOR | SUBCUTANEOUS | 5 refills | Status: DC

## 2021-02-04 NOTE — Telephone Encounter (Signed)
I have changed prescription to reflect. I called [facility].

## 2021-02-17 NOTE — Telephone Encounter (Signed)
noted 

## 2021-02-17 NOTE — Telephone Encounter (Signed)
O reached patient & he stated that when he had surgery at [facility] after speaking with Catie, that they changed regimen once again. Since the surgery he has been sedimentary so [facility] increased Lantus to 45 units. They have kept meal time or Novolog insulin around the same at 20 units with meals then if consistently >200 to increase Novolog to 24 units. He f/u with Joycelyn Schmid 8/22 & sees Dr. Gabriel Carina for the first time 8/23.

## 2021-02-25 ENCOUNTER — Telehealth: Payer: Self-pay | Admitting: Family

## 2021-02-25 NOTE — Telephone Encounter (Signed)
Patient dropped off a short term disability form to be completed. It is only for 1 week. Form is up front in Arnett's color folder.

## 2021-02-28 NOTE — Telephone Encounter (Signed)
Received paperwork from the front desk. It has been placed in Dominic [name] red folder on her desk.

## 2021-03-03 ENCOUNTER — Ambulatory Visit (INDEPENDENT_AMBULATORY_CARE_PROVIDER_SITE_OTHER): Payer: Managed Care, Other (non HMO) | Admitting: Family

## 2021-03-03 ENCOUNTER — Encounter: Payer: Self-pay | Admitting: Family

## 2021-03-03 ENCOUNTER — Other Ambulatory Visit: Payer: Self-pay

## 2021-03-03 DIAGNOSIS — E119 Type 2 diabetes mellitus without complications: Secondary | ICD-10-CM

## 2021-03-03 DIAGNOSIS — L8990 Pressure ulcer of unspecified site, unspecified stage: Secondary | ICD-10-CM | POA: Insufficient documentation

## 2021-03-03 DIAGNOSIS — I10 Essential (primary) hypertension: Secondary | ICD-10-CM | POA: Diagnosis not present

## 2021-03-03 DIAGNOSIS — Z794 Long term (current) use of insulin: Secondary | ICD-10-CM

## 2021-03-03 DIAGNOSIS — K5900 Constipation, unspecified: Secondary | ICD-10-CM | POA: Insufficient documentation

## 2021-03-03 DIAGNOSIS — L8992 Pressure ulcer of unspecified site, stage 2: Secondary | ICD-10-CM

## 2021-03-03 NOTE — Assessment & Plan Note (Signed)
Last a1c 8.2.  CGM GMI 7.0%. target range 68%. 1% low. Active 100%. Overall improved control. Goal for patient is less <8 based on complexities and risk of hypoglycemic episodes complicated by rectal cancer, metastasized to pancreas.  We discussed at length today nausea, fluctuating appetite and my concern for hypoglycemia.  We jointly agreed to maximize metformin as tolerated to '2000mg'$ /day and decreased Humalog to 15 units with meals. Continue lantus 45 units ( had been 64 units). He also continues to continue to loose weight therefore we will continue to reduce insulin for safety.  He plans declines seeing endocrine at this time due to seeing so many specialists at this time.   I am certainly compassionate towards this.  I would like for him to see endocrine as I am concerned about the complexities of metastases to his pancreas and his ability to secrete insulin; he understands this.  I reiterated the importance of rescheduling with Dr. Gabriel Carina however meanwhile we will follow him very closely for clinic.

## 2021-03-03 NOTE — Patient Instructions (Addendum)
Please purchase over-the-counter Desitin cream for the pressure sore on coccyx.  Please ensure that you are shifting weight from side to side and not sit directly on the coccyx to allow it to heal.  Please call me with any concerns or if it is slow to heal   worried about your low blood sugar.  I would like to decrease meal dosed insulin, Humalog 15 units with high carb meal (and only if you are eating a substantial portion). Continue lantus 45 units.   Start metformin more consistently  with metformin  which doesn't cause hypoglycemic episodes.  Please call and reschedule with endocrinology as we discussed.   increase to one tablets in the morning and two tablets night ( total of '1000mg'$ ) . The next week, you may take take two tablets in the morning and two tablets in the morning.  The fourth week, you may take two tablets in the morning ( '1000mg'$  total) and two tablets at night ('1000mg'$  total). This will bring you to a maximum daily dose of '2000mg'$ /day which is maximum dose. Along the way, if you want to increase more slowly, please do as this medication can cause GI discomfort and loose stools which usually get better with time , however some patients find that they can only tolerate a certain dose and cannot increase to maximum dose.    Constipation plan  1) Take Miralax once to twice per day until you have a bowel movement. You will end up titrating the use of Miralax. For example, you  may find that using the medication every other day or three times a week is a good bowel regimen for you. Or perhaps, twice weekly.   2)  If you do not get results with Miralax alone, you may then add Bisacodyl suppository daily to regimen until you get desired bowel results.  3) Take Colace ( stool softener) twice daily every day.   It is MOST important to drink LOTS of water and follow a HIGH fiber diet to keep foods moving through the gut. You may add Metamucil to a beverage that you drink.  Information on  prevention of constipation as well as acute treatment for constipation as included below.  If there is no improvement in your symptoms, or if there is any worsening of symptoms, or if you have any additional concerns, please return to this clinic for re-evaluation; or, if we are closed, consider going to the Emergency Room for evaluation.    Constipation Prevention What is Constipation? Constipation is hard, dry bowel movements or the inability to have a bowel movement.  You can also feel like you need to have a bowel movement but not be able to.  It can also be painful when you strain to have a bowel movement.  Taking narcotic pain medicine after surgery can make you constipated, even if you have never had a problem with constipation. What Do I Need To Do? The best thing to do for constipation is to keep it from happening.  This can be done by: Adding laxatives to your daily routine, when taking prescription pain medicines after surgery. Add 17 gm Miralax daily or 100 mg Colace once or twice daily. (Miralax is mixed in water. Colace is a pill). They soften your bowel movements to make them easier to pass and hurt less. Drink plenty of water to help flush your bowels.  (Eight, 8 ounce glasses daily) Eat foods high in fiber such as whole grains, vegetables, cereals, fruits, and  prune juice (5-7 servings a day or 25 grams).  If you do not know how much fiber a food has in it you can look on the label under "dietary fiber."  If you have trouble getting enough fiber in your diet you may want to consider a fiber supplement such as Metamucil or Citrucel.  Also, be aware that eating fiber without drinking enough water can make constipation worse. If you do become constipated some medications that may help are: Bisacodyl (Dulcolax) is available in tablet form or a suppository. Glycerin suppositories are also a good choice if you need a fast acting medication. Everybody is different and may have different  results.  Talk to your pharmacist or health care provider about your specific problems. They can help you choose the best product for you.  Why Is It Important for Me To Do This? Being constipated is not something you have to live with.  There are many things you can do to help.   Feeling bad can interfere with your recovery after surgery.  If constipation goes on for too long it can become a very serious medical problem. You may need to visit your doctor or go to the hospital.  That is why it is very important to drink lots of water, eat enough fiber, and keep it from happening.  Ask Questions We want to answer all of your questions and concerns.  That's why we encourage you to use a program called Ask Me 3T, created by the [employer].  By using Ask Me 3T you are encouraged to ask 3 simple (yet, potentially life saving questions) whenever you are talking with your physician, nurse or pharmacist: What is my main problem? What do I need to do? Why is it important for me to do this? By understanding the answer to these three questions and any other questions you may have, you have the knowledge necessary to manage your health. Please feel very comfortable asking any questions. Healthcare is complicated, so if you hear an answer you do not understand, please ask your health care team to explain again.   Sources: [employer] Med[employer] Plus 05-01-10 N

## 2021-03-03 NOTE — Assessment & Plan Note (Signed)
Chronic, stable.  Continue current regimen at this time.

## 2021-03-03 NOTE — Assessment & Plan Note (Signed)
No evidence of infection.Counseled patient's on importance of weight redistribution continue to allow it to heal on its own.Although he remains fatigued, he is getting up and out of bed more often encouraged continued do as much as he is able.  Advised barrier cream.  Close follow-up in 2 weeks. Will consult wound care if not improving.  Counseled on importance of Dm control to prevent infection

## 2021-03-03 NOTE — Assessment & Plan Note (Signed)
Chronic, suspect exacerbated underlying rectal cancer.  Counseled on how to use and titrate MiraLAX and also to start daily Colace.  We will continue to follow.

## 2021-03-03 NOTE — Progress Notes (Signed)
Subjective:    Patient ID: Dominic [name], male    DOB: 1971-02-24, 50 y.o.   MRN: [id number]  CC: Dominic [name] is a 50 y.o. male who presents today for follow up.   HPI: He has been fatigued since surgery 3 weeks ago, this is unchanged.  Sleeping during the day. He is no longer working.   Dr Ivar Drape, Oncology prescribed augmentin  ( 7 day course) and urosodiol last week after surgery.unable to see a note pertaining to this.    No skin rash. Incision is healing well.  He has follow up with Dr Ivar Drape in 5 days.  He is eating more carbohydrates in general. Occasional nausea. Some days complete loss of appetite and he may not eat a meal.  He has laying in bed after surgery these past 3 weeks and has pressure sore on coccyx. No drainage from wound or fever.   DM-compliant with Lantus 45 units daily, Humalog 20-22 units 3 times daily with meals.   Last night had two small pizza slices, he woke up at 1am and blood sugar 52. Prior to dinner last night blood sugar at been 217. Ate a brownie and ham and blood sugar came back to 79 this morning.  He may start a meal and then get nauseated and not complete the meal. He is not taking metformin regularly. He took '1000mg'$  metformin yesterday.  No diarrhea. Endorses constipation.  He is not on stool softener or MiraLAX  Reports hypoglycemic episodes, 3 episodes since surgery 3 weeks ago.  All occurred in the early morning. He is currently using libre 2 CGM however ran out of strips 3 days ago. He plans to pick these up.   Hypertension, history of SVT-compliant with amlodipine 10 mg, HCTZ 25 mg, metoprolol succinate 100 mg, olmesartan 40 mg  Following with oncology, Dr Oralia Rud, surgical oncology, at [facility], Dr Hyman Hopes for rectal cancer metastasized to liver. No chemotherapy for the last 2 months.   Robot-assisted laparoscopy , cholecystectomy With insertion of implantable infusion pump for chemo of liver 02/12/21 at [facility] 02/27/21 bilirubin 2.1   Hemoglobin 9.2   A1 8.5 2 weeks ago  He canceled appt with Dr. Gabriel Carina tomorrow   HISTORY:  Past Medical History:  Diagnosis Date   Anxiety    Cancer [facility])    Rectal cancer metastasized to liver ([place])   Diabetes mellitus without complication ([place])    Dysrhythmia    svt   Family history of esophageal cancer    Family history of prostate cancer    GERD (gastroesophageal reflux disease)    High triglycerides    Hypertension    Long-term insulin use in type 2 diabetes ([place])    Morbid obesity (H[facility])    Psoriatic arthritis ([place])    SVT (supraventricular tachycardia) ([place])    Past Surgical History:  Procedure Laterality Date   EUS N/A 05/23/2020   Procedure: FULL UPPER ENDOSCOPIC ULTRASOUND (EUS) RADIAL;  Surgeon: Jola Schmidt, MD;  Location: [facility] ENDOSCOPY;  Service: Endoscopy;  Laterality: N/A;   PORTA CATH INSERTION N/A 05/30/2020   Procedure: PORTA CATH INSERTION;  Surgeon: Algernon Huxley, MD;  Location: [place] [facility];  Service: Cardiovascular;  Laterality: N/A;   UPPER GASTROINTESTINAL ENDOSCOPY     Family History  Problem Relation Age of Onset   Arthritis Mother    Heart disease Mother    Supraventricular tachycardia Mother        s/p ablation   Hypertension Mother  Diabetes Father    Heart attack Maternal Uncle    Throat cancer Maternal Uncle    Esophageal cancer Maternal Uncle    Heart attack Maternal Uncle    Heart attack Maternal Uncle    Prostate cancer Maternal Uncle    Thyroid cancer Neg Hx    Colon cancer Neg Hx    Rectal cancer Neg Hx    Stomach cancer Neg Hx    Colon polyps Neg Hx     Allergies: Patient has no known allergies. Current Outpatient Medications on File Prior to Visit  Medication Sig Dispense Refill   ALPHA LIPOIC ACID PO Take 1 capsule by mouth daily.     ALPRAZolam (XANAX) 0.5 MG tablet Take half tablet to one tablet by mouth as needed daily for insomnia, anxiety. 30 tablet 1   amLODipine (NORVASC) 10 MG tablet TAKE 1 TABLET BY  MOUTH  DAILY (Patient taking differently: 5 mg.) 90 tablet 3   aspirin EC 81 MG tablet Take 1 tablet (81 mg total) by mouth daily. Swallow whole. 30 tablet 11   B Complex Vitamins (VITAMIN B COMPLEX PO) Take 1 tablet by mouth daily.     Barberry-Oreg Grape-Goldenseal (BERBERINE COMPLEX PO) Take 1 tablet by mouth daily.     CONTOUR NEXT TEST test strip USE 1 STRIP TO TEST BLOOD  SUGAR TWICE DAILY 200 strip 3   gabapentin (NEURONTIN) 100 MG capsule Take 2 capsules (200 mg total) by mouth 3 (three) times daily. 540 capsule 0   hydrochlorothiazide (HYDRODIURIL) 25 MG tablet Take 25 mg by mouth daily.     insulin glargine (LANTUS SOLOSTAR) 100 UNIT/ML Solostar Pen Inject 64 Units into the skin daily. (Patient taking differently: Inject 45 Units into the skin daily.) 60 mL 3   insulin lispro (HUMALOG KWIKPEN) 100 UNIT/ML KwikPen Inject 18-20 units into the skin after breakfast & lunch. Inject 22-24 units into the skin after dinner. Hold if glucose premeal <100. 45 mL 5   Insulin Pen Needle (PEN NEEDLES) 32G X 6 MM MISC Used to give insulin injections 4 times daily. 100 each 10   [name] Take 5 mg by mouth at bedtime as needed.     metFORMIN (GLUCOPHAGE XR) 500 MG 24 hr tablet Take 4 tablets (2,000 mg total) by mouth every evening. 120 tablet 3   metoprolol succinate (TOPROL-XL) 100 MG 24 hr tablet TAKE 1 TABLET BY MOUTH  DAILY WITH OR IMMEDIATELY  FOLLOWING A MEAL 90 tablet 3   Microlet Lancets MISC Used to check blood sugars three times a day. 200 each 5   Omega-3 Fatty Acids (FISH OIL PO) Take 800 mg by mouth daily.     omeprazole (PRILOSEC) 20 MG capsule Take 1 capsule (20 mg total) by mouth in the morning and at bedtime. 180 capsule 0   telmisartan (MICARDIS) 40 MG tablet Take 1 tablet (40 mg total) by mouth daily. PLEASE CALL OFFICE TO SCHEDULE FOLLOW UP APPOINTMENT FOR REFILLS. 90 tablet 1   zinc gluconate 50 MG tablet Take 50 mg by mouth daily.     albuterol (VENTOLIN HFA) 108 (90 Base)  MCG/ACT inhaler Inhale 2 puffs into the lungs every 6 (six) hours as needed for wheezing or shortness of breath. (Patient not taking: No sig reported) 8 g 0   benzonatate (TESSALON) 100 MG capsule Take 1 capsule (100 mg total) by mouth 2 (two) times daily as needed for cough. (Patient not taking: No sig reported) 20 capsule 0   Continuous  Blood Gluc Sensor (FREESTYLE LIBRE 2 SENSOR) MISC Scan glucose at least QID (Patient not taking: No sig reported) 2 each 11   fluticasone-salmeterol (ADVAIR) 100-50 MCG/ACT AEPB Inhale 1 puff into the lungs 2 (two) times daily. Rinse mouth out after each use, swish with water and spit out after each use. (Patient not taking: No sig reported) 1 each 0   lactulose (CHRONULAC) 10 GM/15ML solution Take 15 mLs (10 g total) by mouth 2 (two) times daily as needed for moderate constipation or severe constipation. (Patient not taking: No sig reported) 236 mL 0   lidocaine-prilocaine (EMLA) cream Apply to affected area once (Patient not taking: No sig reported) 30 g 3   loperamide (IMODIUM A-D) 2 MG tablet Take 1 tablet (2 mg total) by mouth See admin instructions. Take 2 tabs at diarrhea onset , then 1 tab after every loose bowel movement. Maximum 8 tablets within 24 hour period of time (Patient not taking: No sig reported) 100 tablet 1   magic mouthwash w/lidocaine SOLN Take 5 mLs by mouth 4 (four) times daily as needed for mouth pain. 80 ml viscous lidocaine 2%, 80 ml Mylanta, 80 ml Diphenhydramine 12.5 mg/5 ml Elixir, 80 ml Nystatin 100,000 Unit suspension, 80 ml Prednisolone 15 mg/48m, 80 ml Distilled Water.  Sig: Swish/Swallow 5-10 ml four times a day as needed. (Patient not taking: No sig reported) 480 mL 3   ondansetron (ZOFRAN ODT) 4 MG disintegrating tablet Take 1 tablet (4 mg total) by mouth every 8 (eight) hours as needed for nausea or vomiting. (Patient not taking: No sig reported) 30 tablet 2   ondansetron (ZOFRAN) 8 MG tablet Take 1 tablet (8 mg total) by mouth 2  (two) times daily as needed for refractory nausea / vomiting. Start on day 3 after chemotherapy. (Patient not taking: No sig reported) 30 tablet 1   polyethylene glycol powder (GLYCOLAX/MIRALAX) 17 GM/SCOOP powder Take by mouth daily. 1/2 capful QD (Patient not taking: No sig reported)     prochlorperazine (COMPAZINE) 10 MG tablet Take 1 tablet (10 mg total) by mouth every 6 (six) hours as needed (Nausea or vomiting). (Patient not taking: No sig reported) 30 tablet 1   senna (SENOKOT) 8.6 MG TABS tablet Take 2 tablets (17.2 mg total) by mouth daily. (Patient not taking: No sig reported) 120 tablet 0   tamsulosin (FLOMAX) 0.4 MG CAPS capsule Take 1 capsule (0.4 mg total) by mouth daily. (Patient not taking: No sig reported) 30 capsule 1   Current Facility-Administered Medications on File Prior to Visit  Medication Dose Route Frequency Provider Last Rate Last Admin   0.9 %  sodium chloride infusion  500 mL Intravenous Once Danis, HKirke Corin MD        Social History   Tobacco Use   Smoking status: Never   Smokeless tobacco: Never  Vaping Use   Vaping Use: Never used  Substance Use Topics   Alcohol use: Yes    Alcohol/week: 1.0 standard drink    Types: 1 Glasses of wine per week    Comment: very occasional   Drug use: Never    Review of Systems  Constitutional:  Negative for chills and fever.  Respiratory:  Negative for cough.   Cardiovascular:  Negative for chest pain and palpitations.  Gastrointestinal:  Positive for constipation and nausea. Negative for diarrhea and vomiting.  Skin:  Negative for wound.     Objective:    BP 114/62   Pulse 97   Temp  98.5 F (36.9 C)   Ht 5' 9.02" (1.753 m)   Wt 229 lb 3.2 oz (104 kg)   SpO2 97%   BMI 33.83 kg/m  BP Readings from Last 3 Encounters:  03/03/21 114/62  01/07/21 (!) 143/79  12/26/20 (!) 147/81   Wt Readings from Last 3 Encounters:  03/03/21 229 lb 3.2 oz (104 kg)  01/28/21 238 lb 11.2 oz (108.3 kg)  01/07/21 246 lb 1.6  oz (111.6 kg)    Physical Exam Vitals reviewed.  Constitutional:      Appearance: He is well-developed.  Cardiovascular:     Rate and Rhythm: Regular rhythm.     Heart sounds: Normal heart sounds.  Pulmonary:     Effort: Pulmonary effort is normal. No respiratory distress.     Breath sounds: Normal breath sounds. No wheezing, rhonchi or rales.  Abdominal:       Comments: Implantable infusion pump noted left upper quadrant of abdomen.  3-4 Healing well approximated laparoscopic excision noted on abdomen.  Skin:    General: Skin is warm and dry.          Comments: Dime size erythematous skin breakdown over coccyx.  Wound bed is pink. no serous or purulent discharge.  Granulation tissue, eschar and slough not present  Neurological:     Mental Status: He is alert.  Psychiatric:        Speech: Speech normal.        Behavior: Behavior normal.       Assessment & Plan:   Problem List Items Addressed This Visit       Cardiovascular and Mediastinum   Hypertension    Chronic, stable.  Continue current regimen at this time.         Endocrine   Type 2 diabetes mellitus without complication, with long-term current use of insulin (HCC)    Last a1c 8.2.  CGM GMI 7.0%. target range 68%. 1% low. Active 100%. Overall improved control. Goal for patient is less <8 based on complexities and risk of hypoglycemic episodes complicated by rectal cancer, metastasized to pancreas.  We discussed at length today nausea, fluctuating appetite and my concern for hypoglycemia.  We jointly agreed to maximize metformin as tolerated to '2000mg'$ /day and decreased Humalog to 15 units with meals. Continue lantus 45 units ( had been 64 units). He also continues to continue to loose weight therefore we will continue to reduce insulin for safety.  He plans declines seeing endocrine at this time due to seeing so many specialists at this time.   I am certainly compassionate towards this.  I would like for him to see  endocrine as I am concerned about the complexities of metastases to his pancreas and his ability to secrete insulin; he understands this.  I reiterated the importance of rescheduling with Dr. Gabriel Carina however meanwhile we will follow him very closely for clinic.        Musculoskeletal and Integument   Pressure sore    No evidence of infection.Counseled patient's on importance of weight redistribution continue to allow it to heal on its own.Although he remains fatigued, he is getting up and out of bed more often encouraged continued do as much as he is able.  Advised barrier cream.  Close follow-up in 2 weeks. Will consult wound care if not improving.  Counseled on importance of Dm control to prevent infection        Other   Constipation    Chronic, suspect exacerbated underlying rectal cancer.  Counseled on how to use and titrate MiraLAX and also to start daily Colace.  We will continue to follow.        I am having Dominic [name] maintain his [name], metoprolol succinate, Pen Needles, ondansetron, Contour Next Test, tamsulosin, lidocaine-prilocaine, prochlorperazine, ondansetron, polyethylene glycol powder, amLODipine, hydrochlorothiazide, metFORMIN, senna, lactulose, Omega-3 Fatty Acids (FISH OIL PO), zinc gluconate, loperamide, aspirin EC, magic mouthwash w/lidocaine, FreeStyle Libre 2 Sensor, omeprazole, B Complex Vitamins (VITAMIN B COMPLEX PO), ALPHA LIPOIC ACID PO, telmisartan, ALPRAZolam, Microlet Lancets, gabapentin, albuterol, benzonatate, fluticasone-salmeterol, Barberry-Oreg Grape-Goldenseal (BERBERINE COMPLEX PO), Lantus SoloStar, and insulin lispro. We will continue to administer sodium chloride.   No orders of the defined types were placed in this encounter.   Return precautions given.   Risks, benefits, and alternatives of the medications and treatment plan prescribed today were discussed, and patient expressed understanding.   Education regarding symptom management  and diagnosis given to patient on AVS.  Continue to follow with Dominic Hawthorne, FNP for routine health maintenance.   Dominic [name] and I agreed with plan.   Mable Paris, FNP

## 2021-03-04 NOTE — Telephone Encounter (Signed)
Pt called and notified. Pt requested form be faxed and he will come and pick it up from front desk when he gets time.

## 2021-03-04 NOTE — Telephone Encounter (Signed)
Please complete and call pt

## 2021-03-21 ENCOUNTER — Telehealth: Payer: Self-pay

## 2021-03-21 NOTE — Telephone Encounter (Signed)
-----   Message from Earlie Server, MD sent at 03/20/2021  6:59 PM EDT ----- Please arrange him to see me and also Vonna Kotyk,  MD only follow up.

## 2021-03-21 NOTE — Telephone Encounter (Signed)
Please schedule patient as requested by MD and notify pt of appt. Thanks

## 2021-03-25 ENCOUNTER — Encounter: Payer: Self-pay | Admitting: Family

## 2021-03-25 ENCOUNTER — Other Ambulatory Visit: Payer: Self-pay | Admitting: Family

## 2021-03-25 ENCOUNTER — Ambulatory Visit (INDEPENDENT_AMBULATORY_CARE_PROVIDER_SITE_OTHER): Payer: Managed Care, Other (non HMO) | Admitting: Family

## 2021-03-25 ENCOUNTER — Other Ambulatory Visit: Payer: Self-pay

## 2021-03-25 VITALS — BP 102/58 | HR 100 | Temp 98.6°F | Ht 69.0 in | Wt 227.4 lb

## 2021-03-25 DIAGNOSIS — E119 Type 2 diabetes mellitus without complications: Secondary | ICD-10-CM

## 2021-03-25 DIAGNOSIS — Z794 Long term (current) use of insulin: Secondary | ICD-10-CM

## 2021-03-25 DIAGNOSIS — L299 Pruritus, unspecified: Secondary | ICD-10-CM

## 2021-03-25 DIAGNOSIS — F419 Anxiety disorder, unspecified: Secondary | ICD-10-CM

## 2021-03-25 DIAGNOSIS — I471 Supraventricular tachycardia: Secondary | ICD-10-CM

## 2021-03-25 DIAGNOSIS — I10 Essential (primary) hypertension: Secondary | ICD-10-CM

## 2021-03-25 DIAGNOSIS — C20 Malignant neoplasm of rectum: Secondary | ICD-10-CM

## 2021-03-25 DIAGNOSIS — F32A Depression, unspecified: Secondary | ICD-10-CM

## 2021-03-25 DIAGNOSIS — C787 Secondary malignant neoplasm of liver and intrahepatic bile duct: Secondary | ICD-10-CM

## 2021-03-25 MED ORDER — METFORMIN HCL ER 500 MG PO TB24: 2000.0000 mg | ORAL_TABLET | Freq: Every evening | ORAL | 3 refills | Status: DC

## 2021-03-25 MED ORDER — MOMETASONE FUROATE 0.1 % EX CREA
1.0000 | TOPICAL_CREAM | Freq: Every day | CUTANEOUS | 1 refills | Status: AC
Start: 2021-03-25 — End: ?

## 2021-03-25 MED ORDER — LORAZEPAM 0.5 MG PO TABS: 0.5000 mg | ORAL_TABLET | Freq: Two times a day (BID) | ORAL | 1 refills | Status: AC | PRN

## 2021-03-25 MED ORDER — AMLODIPINE BESYLATE 5 MG PO TABS: 5.0000 mg | ORAL_TABLET | Freq: Every day | ORAL | 1 refills | Status: AC

## 2021-03-25 MED ORDER — FLUOXETINE HCL 10 MG PO TABS: 10.0000 mg | ORAL_TABLET | Freq: Every day | ORAL | 3 refills | Status: AC

## 2021-03-25 NOTE — Assessment & Plan Note (Signed)
Very difficult conversation as relates to poor prognosis.  I was impressed by patient's coping mechanisms.  Congratulated him on his marriage.  Had a long discussion that he wanted to do everything I could to help him with his anxiety, depression as he enjoys all of his remaining days.  Also consulted with pharmacist, Catie Darnelle Maffucci as it relates to liver failure and metabolism of medications especially do not want him to be excessively sedated.  We jointly agree with change from alprazolam to lorazepam as lorazepam is less hepatically cleared.  Advised he may take 1 to 2 tablets a day of lorazepam 0.5 mg and to be very mindful of excessive sedation and to let me know right away.  We also agreed that we would start Prozac 10 mg, opted not to start 20 mg as SSRI are also hepatically cleared.  Close follow-up in 4 weeks time

## 2021-03-25 NOTE — Assessment & Plan Note (Signed)
Blood pressure on the low end today.  Continued weight loss.  Advised patient to take amlodipine 5 mg with telmisartan 40 mg.  Over time, we can gradually decrease his doses as well.  We will monitor

## 2021-03-25 NOTE — Progress Notes (Signed)
Subjective:    Patient ID: Dominic [name], male    DOB: Dec 07, 1970, 50 y.o.   MRN: [id number]  CC: Dominic [name] is a 50 y.o. male who presents today for follow up.   HPI: Complains of fatigue, intermittent nausea.  No pain at this time. comes and goes.  Dominic [name] was told Dominic [name] has somewhere between 2 to 5 months left leg.  Plans to have a conversation this week with oncology as it relates to hospice.    Comments are mostly regular.  Dominic [name] has times on his difficult value to come out and suspect is related to rectal mass.  Dominic [name] is taking Colace.No abdominal pain.   Dominic [name] was married over the weekend to long time partner.   Dominic [name] also reports that his ears have felt itchy.  occasional flaky discharge seen  Compliant with Xanax 0.'5mg'$  daily , approx 4 times per week. Would like refill today.  Endorses depression, anxiety. Gabapentin '100mg'$  taken once per week for peripheral neuropathy Zofran '8mg'$  3-4 times per week for nausea.   Dominic [name] has not been on chemo for 3 months.    Admitted 03/08/21 for liver failure HAI pump placed august 2022. Admitted for hydration and monitoring LFTs and hopes of giving Hepatic Arterial Infusion (HAI) With Floxuridine (FUDR)  Admission significant for her.  CT abdomen pelvis was 8/22 noted stable adenopathy.Bjorn Loser 03/01/2021 mild intrahepatic ductal dilatation from tumor, stable to slightly increased urinary dilatation. Bilirubin made overall stable too high for FUDR.  At discharge, started colace. Dominic [name] is having soft brown stool. No constipation.   DM- not wearing CGM as Dominic [name] is on STD and has resumed using glucometer. Blood glucose is 200 before dinner.   15 units of glargine.H e will Dominic a meal if feeling nauseated or planning to eat less carbs.  FBG 130.   20 units lispro TID with meals  . Previously on lantus 45 units, humalog 15 TID with meals.  Sliding scale begins if glucose > 200  201 - 250 mg/dL 3 units 251 - 300 mg/dL 6 units 301 - 350 mg/dL 9 units > 350 mg/dL 12  units & notify Provider  HTN- Dominic [name] was told to start '5mg'$  amlodipine. Dominic [name] has been on '10mg'$  amlodipine. Taking telmisartan '40mg'$ .  Palpitations are unchanged.  Dominic [name] is compliant with metoprolol 100 mg.  No chest pain.   Consult with Dr Lurline Hare 03/07/21 for second opinion. Prognosis worsened due to cancer's resistance to therapy. Recommended hospice   HISTORY:  Past Medical History:  Diagnosis Date   Anxiety    Cancer Dominic [name])    Rectal cancer metastasized to liver ([place])   Diabetes mellitus without complication ([place])    Dysrhythmia    svt   Family history of esophageal cancer    Family history of prostate cancer    GERD (gastroesophageal reflux disease)    High triglycerides    Hypertension    Long-term insulin use in type 2 diabetes ([place])    Morbid obesity ([facility])    Psoriatic arthritis ([place])    SVT (supraventricular tachycardia) ([place])    Past Surgical History:  Procedure Laterality Date   EUS N/A 05/23/2020   Procedure: FULL UPPER ENDOSCOPIC ULTRASOUND (EUS) RADIAL;  Surgeon: Jola Schmidt, MD;  Location: [facility] ENDOSCOPY;  Service: Endoscopy;  Laterality: N/A;   PORTA CATH INSERTION N/A 05/30/2020   Procedure: PORTA CATH INSERTION;  Surgeon: Algernon Huxley, MD;  Location: [facility];  Service: Cardiovascular;  Laterality: N/A;  UPPER GASTROINTESTINAL ENDOSCOPY     Family History  Problem Relation Age of Onset   Arthritis Mother    Heart disease Mother    Supraventricular tachycardia Mother        s/p ablation   Hypertension Mother    Diabetes Father    Heart attack Maternal Uncle    Throat cancer Maternal Uncle    Esophageal cancer Maternal Uncle    Heart attack Maternal Uncle    Heart attack Maternal Uncle    Prostate cancer Maternal Uncle    Thyroid cancer Neg Hx    Colon cancer Neg Hx    Rectal cancer Neg Hx    Stomach cancer Neg Hx    Colon polyps Neg Hx     Allergies: Patient has no known allergies. Current Outpatient Medications on File Prior to Visit  Medication  Sig Dispense Refill   Alpha-Lipoic Acid 200 MG CAPS Take 1 capsule by mouth daily.     aspirin EC 81 MG tablet Take 1 tablet (81 mg total) by mouth daily. Swallow whole. 30 tablet 11   B Complex Vitamins (VITAMIN B COMPLEX PO) Take 1 tablet by mouth daily.     CONTOUR NEXT TEST test strip USE 1 STRIP TO TEST BLOOD  SUGAR TWICE DAILY 200 strip 3   gabapentin (NEURONTIN) 100 MG capsule Take 2 capsules (200 mg total) by mouth 3 (three) times daily. 540 capsule 0   hydrochlorothiazide (HYDRODIURIL) 25 MG tablet Take 25 mg by mouth daily.     insulin lispro (HUMALOG KWIKPEN) 100 UNIT/ML KwikPen Inject 18-20 units into the skin after breakfast & lunch. Inject 22-24 units into the skin after dinner. Hold if glucose premeal <100. 45 mL 5   Insulin Pen Needle (PEN NEEDLES) 32G X 6 MM MISC Used to give insulin injections 4 times daily. 100 each 10   [name] Take 5 mg by mouth at bedtime as needed.     metoprolol succinate (TOPROL-XL) 100 MG 24 hr tablet TAKE 1 TABLET BY MOUTH  DAILY WITH OR IMMEDIATELY  FOLLOWING A MEAL 90 tablet 3   Microlet Lancets MISC Used to check blood sugars three times a day. 200 each 5   Omega-3 Fatty Acids (FISH OIL PO) Take 800 mg by mouth daily.     omeprazole (PRILOSEC) 20 MG capsule Take 1 capsule (20 mg total) by mouth in the morning and at bedtime. 180 capsule 0   ondansetron (ZOFRAN) 8 MG tablet Take 1 tablet (8 mg total) by mouth 2 (two) times daily as needed for refractory nausea / vomiting. Start on day 3 after chemotherapy. 30 tablet 1   polyethylene glycol powder (GLYCOLAX/MIRALAX) 17 GM/SCOOP powder Take by mouth daily. 1/2 capful QD     senna (SENOKOT) 8.6 MG TABS tablet Take 2 tablets (17.2 mg total) by mouth daily. 120 tablet 0   tamsulosin (FLOMAX) 0.4 MG CAPS capsule Take 1 capsule (0.4 mg total) by mouth daily. 30 capsule 1   telmisartan (MICARDIS) 40 MG tablet Take 1 tablet (40 mg total) by mouth daily. PLEASE CALL OFFICE TO SCHEDULE FOLLOW UP APPOINTMENT  FOR REFILLS. 90 tablet 1   zinc gluconate 50 MG tablet Take 50 mg by mouth daily.     Barberry-Oreg Grape-Goldenseal (BERBERINE COMPLEX PO) Take 1 tablet by mouth daily. (Patient not taking: Reported on 03/25/2021)     insulin glargine (LANTUS SOLOSTAR) 100 UNIT/ML Solostar Pen Inject 64 Units into the skin daily. (Patient taking differently: Inject 15 Units into  the skin daily.) 60 mL 3   lactulose (CHRONULAC) 10 GM/15ML solution Take 15 mLs (10 g total) by mouth 2 (two) times daily as needed for moderate constipation or severe constipation. (Patient not taking: No sig reported) 236 mL 0   lidocaine-prilocaine (EMLA) cream Apply to affected area once (Patient not taking: No sig reported) 30 g 3   loperamide (IMODIUM A-D) 2 MG tablet Take 1 tablet (2 mg total) by mouth See admin instructions. Take 2 tabs at diarrhea onset , then 1 tab after every loose bowel movement. Maximum 8 tablets within 24 hour period of time (Patient not taking: No sig reported) 100 tablet 1   magic mouthwash w/lidocaine SOLN Take 5 mLs by mouth 4 (four) times daily as needed for mouth pain. 80 ml viscous lidocaine 2%, 80 ml Mylanta, 80 ml Diphenhydramine 12.5 mg/5 ml Elixir, 80 ml Nystatin 100,000 Unit suspension, 80 ml Prednisolone 15 mg/12m, 80 ml Distilled Water.  Sig: Swish/Swallow 5-10 ml four times a day as needed. (Patient not taking: No sig reported) 480 mL 3   Current Facility-Administered Medications on File Prior to Visit  Medication Dose Route Frequency Provider Last Rate Last Admin   0.9 %  sodium chloride infusion  500 mL Intravenous Once Danis, HKirke Corin MD        Social History   Tobacco Use   Smoking status: Never   Smokeless tobacco: Never  Vaping Use   Vaping Use: Never used  Substance Use Topics   Alcohol use: Yes    Alcohol/week: 1.0 standard drink    Types: 1 Glasses of wine per week    Comment: very occasional   Drug use: Never    Review of Systems  Constitutional:  Positive for  fatigue. Negative for chills and fever.  Respiratory:  Negative for cough.   Cardiovascular:  Negative for chest pain and palpitations.  Gastrointestinal:  Positive for nausea. Negative for abdominal pain and vomiting.  Psychiatric/Behavioral:  Positive for sleep disturbance. The patient is nervous/anxious.      Objective:    BP (!) 102/58 (BP Location: Left Arm, Patient Position: Sitting, Cuff Size: Large)   Pulse 100   Temp 98.6 F (37 C) (Oral)   Ht '5\' 9"'$  (1.753 m)   Wt 227 lb 6.4 oz (103.1 kg)   SpO2 99%   BMI 33.58 kg/m  BP Readings from Last 3 Encounters:  03/25/21 (!) 102/58  03/03/21 114/62  01/07/21 (!) 143/79   Wt Readings from Last 3 Encounters:  03/25/21 227 lb 6.4 oz (103.1 kg)  03/03/21 229 lb 3.2 oz (104 kg)  01/28/21 238 lb 11.2 oz (108.3 kg)    Physical Exam Vitals reviewed.  Constitutional:      Appearance: Normal appearance. Dominic [name] is well-developed.  HENT:     Head: Normocephalic and atraumatic.     Right Ear: Hearing, tympanic membrane, ear canal and external ear normal. No decreased hearing noted. No drainage, swelling or tenderness. No middle ear effusion. Tympanic membrane is not injected, erythematous or bulging.     Left Ear: Hearing, tympanic membrane, ear canal and external ear normal. No decreased hearing noted. No drainage, swelling or tenderness.  No middle ear effusion. Tympanic membrane is not injected, erythematous or bulging.     Nose: Nose normal.     Right Sinus: No maxillary sinus tenderness or frontal sinus tenderness.     Left Sinus: No maxillary sinus tenderness or frontal sinus tenderness.     Mouth/Throat:  Pharynx: Uvula midline. No oropharyngeal exudate or posterior oropharyngeal erythema.     Tonsils: No tonsillar abscesses.  Eyes:     Conjunctiva/sclera: Conjunctivae normal.  Cardiovascular:     Rate and Rhythm: Regular rhythm.     Heart sounds: Normal heart sounds.  Pulmonary:     Effort: Pulmonary effort is normal. No  respiratory distress.     Breath sounds: Normal breath sounds. No wheezing, rhonchi or rales.  Abdominal:     General: Bowel sounds are normal. There is no distension.     Palpations: Abdomen is soft. Abdomen is not rigid. There is no fluid wave or mass.     Tenderness: There is no abdominal tenderness. There is no guarding or rebound. Negative signs include Murphy's sign and McBurney's sign.       Comments: Pump  noted luq Jaundice  noted abdomen  Lymphadenopathy:     Head:     Right side of head: No submental, submandibular, tonsillar, preauricular, posterior auricular or occipital adenopathy.     Left side of head: No submental, submandibular, tonsillar, preauricular, posterior auricular or occipital adenopathy.     Cervical: No cervical adenopathy.  Skin:    General: Skin is warm and dry.  Neurological:     Mental Status: Dominic [name] is alert.  Psychiatric:        Speech: Speech normal.        Behavior: Behavior normal.       Assessment & Plan:   Problem List Items Addressed This Visit       Cardiovascular and Mediastinum   Hypertension - Primary    Blood pressure on the low end today.  Continued weight loss.  Advised patient to take amlodipine 5 mg with telmisartan 40 mg.  Over time, we can gradually decrease his doses as well.  We will monitor      Relevant Medications   amLODipine (NORVASC) 5 MG tablet   SVT (supraventricular tachycardia) (HCC)    Chronic, stable.  Continue metoprolol 100 mg      Relevant Medications   amLODipine (NORVASC) 5 MG tablet     Digestive   Rectal cancer metastasized to liver [facility])    Reviewed recent hospital course and consult with Dr. Lurline Hare with patient today.  Dominic [name] is seeing Dr. Tasia Catchings tomorrow with intention to start hospice care.  Discussed that I would do everything I could to keep him comfortable.  We discussed in the setting of liver failure, metabolism of medications, and certainly ensuring that his quality life      Relevant Medications    LORazepam (ATIVAN) 0.5 MG tablet     Endocrine   Type 2 diabetes mellitus without complication, with long-term current use of insulin (HCC)    Lab Results  Component Value Date   HGBA1C 9.4 (A) 11/29/2020  Based on FBG , blood sugar is improving. Post prandial blood sugar remain elevated.  Patient also continues to lose weight and some of her nausea which influences his ability to eat regular meals.  Advised to continue 15 units of glargine, 20 units lispro TID with meals  . Continue Sliding scale begins if glucose > 200 stated below.    201 - 250 mg/dL 3 units 251 - 300 mg/dL 6 units 301 - 350 mg/dL 9 units > 350 mg/dL 12 units & notify Provider  We also were able to provide him with samples of libre sensor so patient could resume libre. I also consulted with Catie Darnelle Maffucci, [place].D. as it  relates to metformin and liver failure.  Shared my concern with patient about risk of lactic acidosis and advised to consider stopping metformin.  Dominic [name] declines at this time as Dominic [name] is read studies which metformin cannot be helpful in the setting of malignancy.  Advised that Dominic [name] speak with Dr. Tasia Catchings, hematology about this tomorrow.  We will certainly continue to monitor and have this discussion. F/u 4 weeks . Pending a1c and asked patient to have done at [facility] as not obtained today during visit.       Relevant Medications   metFORMIN (GLUCOPHAGE XR) 500 MG 24 hr tablet     Other   Anxiety and depression    Very difficult conversation as relates to poor prognosis.  I was impressed by patient's coping mechanisms.  Congratulated him on his marriage.  Had a long discussion that Dominic [name] wanted to do everything I could to help him with his anxiety, depression as Dominic [name] enjoys all of his remaining days.  Also consulted with pharmacist, Catie Darnelle Maffucci as it relates to liver failure and metabolism of medications especially do not want him to be excessively sedated.  We jointly agree with change from alprazolam to lorazepam as lorazepam  is less hepatically cleared.  Advised Dominic [name] may take 1 to 2 tablets a day of lorazepam 0.5 mg and to be very mindful of excessive sedation and to let me know right away.  We also agreed that we would start Prozac 10 mg, opted not to start 20 mg as SSRI are also hepatically cleared.  Close follow-up in 4 weeks time      Relevant Medications   LORazepam (ATIVAN) 0.5 MG tablet   FLUoxetine (PROZAC) 10 MG tablet   Other Visit Diagnoses     Anxiety       Relevant Medications   LORazepam (ATIVAN) 0.5 MG tablet   FLUoxetine (PROZAC) 10 MG tablet        I have discontinued Loralee Pacas. Overacker's prochlorperazine, amLODipine, FreeStyle Libre 2 Sensor, ALPRAZolam, albuterol, benzonatate, and fluticasone-salmeterol. I am also having him start on amLODipine, LORazepam, and FLUoxetine. Additionally, I am having him maintain his [name], metoprolol succinate, Pen Needles, Contour Next Test, tamsulosin, lidocaine-prilocaine, ondansetron, polyethylene glycol powder, hydrochlorothiazide, senna, lactulose, Omega-3 Fatty Acids (FISH OIL PO), zinc gluconate, loperamide, aspirin EC, magic mouthwash w/lidocaine, omeprazole, B Complex Vitamins (VITAMIN B COMPLEX PO), telmisartan, Microlet Lancets, gabapentin, Barberry-Oreg Grape-Goldenseal (BERBERINE COMPLEX PO), Lantus SoloStar, insulin lispro, Alpha-Lipoic Acid, and metFORMIN. We will continue to administer sodium chloride.   Meds ordered this encounter  Medications   amLODipine (NORVASC) 5 MG tablet    Sig: Take 1 tablet (5 mg total) by mouth daily.    Dispense:  90 tablet    Refill:  1    Order Specific Question:   Supervising Provider    Answer:   Deborra Medina L [[id number]]   metFORMIN (GLUCOPHAGE XR) 500 MG 24 hr tablet    Sig: Take 4 tablets (2,000 mg total) by mouth every evening.    Dispense:  120 tablet    Refill:  3    Order Specific Question:   Supervising Provider    Answer:   Deborra Medina L [[id number]]   LORazepam (ATIVAN) 0.5 MG tablet    Sig: Take  1 tablet (0.5 mg total) by mouth 2 (two) times daily as needed for anxiety.    Dispense:  60 tablet    Refill:  1    Order Specific Question:   Supervising Provider  Answer:   Deborra Medina L [[id number]]   FLUoxetine (PROZAC) 10 MG tablet    Sig: Take 1 tablet (10 mg total) by mouth daily.    Dispense:  90 tablet    Refill:  3    Order Specific Question:   Supervising Provider    Answer:   Crecencio Mc [[id number]]     Return precautions given.   Risks, benefits, and alternatives of the medications and treatment plan prescribed today were discussed, and patient expressed understanding.   Education regarding symptom management and diagnosis given to patient on AVS.  Continue to follow with Burnard Hawthorne, FNP for routine health maintenance.   [name] and I agreed with plan.   Mable Paris, FNP   I have spent 40 minutes with a patient including precharting, exam, reviewing medical records, and discussion plan of care.

## 2021-03-25 NOTE — Assessment & Plan Note (Signed)
Reviewed recent hospital course and consult with Dr. Lurline Hare with patient today.  He is seeing Dr. Tasia Catchings tomorrow with intention to start hospice care.  Discussed that I would do everything I could to keep him comfortable.  We discussed in the setting of liver failure, metabolism of medications, and certainly ensuring that his quality life

## 2021-03-25 NOTE — Assessment & Plan Note (Addendum)
Lab Results  Component Value Date   HGBA1C 9.4 (A) 11/29/2020  Based on FBG , blood sugar is improving. Post prandial blood sugar remain elevated.  Patient also continues to lose weight and some of her nausea which influences his ability to eat regular meals.  Advised to continue 15 units of glargine, 20 units lispro TID with meals  . Continue Sliding scale begins if glucose > 200 stated below.    201 - 250 mg/dL 3 units 251 - 300 mg/dL 6 units 301 - 350 mg/dL 9 units > 350 mg/dL 12 units & notify Provider  We also were able to provide him with samples of libre sensor so patient could resume libre. I also consulted with Catie Darnelle Maffucci, [place].D. as it relates to metformin and liver failure.  Shared my concern with patient about risk of lactic acidosis and advised to consider stopping metformin.  He declines at this time as he is read studies which metformin cannot be helpful in the setting of malignancy.  Advised that he speak with Dr. Tasia Catchings, hematology about this tomorrow.  We will certainly continue to monitor and have this discussion. F/u 4 weeks . Pending a1c and asked patient to have done at [facility] as not obtained today during visit.

## 2021-03-25 NOTE — Assessment & Plan Note (Signed)
Chronic, stable.  Continue metoprolol 100 mg

## 2021-03-25 NOTE — Patient Instructions (Addendum)
Stop xanax We will start ativan  which is safer in liver disease. Please be very vigilant regarding sedation as we discussed in setting of liver disease.   Decrease amlodipine to '5mg'$   Continue 15 units of glargine.  20 units lispro TID with meals    Sliding scale begins if glucose > 200  201 - 250 mg/dL 3 units 251 - 300 mg/dL 6 units 301 - 350 mg/dL 9 units > 350 mg/dL 12 units & notify Provider   Please let me know if you need anything at all.

## 2021-03-26 ENCOUNTER — Encounter: Payer: Self-pay | Admitting: Oncology

## 2021-03-26 ENCOUNTER — Inpatient Hospital Stay: Payer: Managed Care, Other (non HMO)

## 2021-03-26 ENCOUNTER — Inpatient Hospital Stay: Payer: Managed Care, Other (non HMO) | Attending: Oncology | Admitting: Oncology

## 2021-03-26 ENCOUNTER — Inpatient Hospital Stay (HOSPITAL_BASED_OUTPATIENT_CLINIC_OR_DEPARTMENT_OTHER): Payer: Managed Care, Other (non HMO) | Admitting: Hospice and Palliative Medicine

## 2021-03-26 VITALS — BP 102/65 | HR 79 | Temp 96.8°F | Resp 18 | Wt 226.1 lb

## 2021-03-26 DIAGNOSIS — C787 Secondary malignant neoplasm of liver and intrahepatic bile duct: Secondary | ICD-10-CM

## 2021-03-26 DIAGNOSIS — Z7982 Long term (current) use of aspirin: Secondary | ICD-10-CM | POA: Insufficient documentation

## 2021-03-26 DIAGNOSIS — Z7189 Other specified counseling: Secondary | ICD-10-CM | POA: Diagnosis not present

## 2021-03-26 DIAGNOSIS — C7A8 Other malignant neuroendocrine tumors: Secondary | ICD-10-CM | POA: Diagnosis not present

## 2021-03-26 DIAGNOSIS — C7951 Secondary malignant neoplasm of bone: Secondary | ICD-10-CM

## 2021-03-26 DIAGNOSIS — C20 Malignant neoplasm of rectum: Secondary | ICD-10-CM | POA: Diagnosis present

## 2021-03-26 DIAGNOSIS — R739 Hyperglycemia, unspecified: Secondary | ICD-10-CM

## 2021-03-26 DIAGNOSIS — Z794 Long term (current) use of insulin: Secondary | ICD-10-CM | POA: Diagnosis not present

## 2021-03-26 DIAGNOSIS — Z515 Encounter for palliative care: Secondary | ICD-10-CM | POA: Diagnosis not present

## 2021-03-26 DIAGNOSIS — Z9049 Acquired absence of other specified parts of digestive tract: Secondary | ICD-10-CM | POA: Diagnosis not present

## 2021-03-26 DIAGNOSIS — E1165 Type 2 diabetes mellitus with hyperglycemia: Secondary | ICD-10-CM | POA: Insufficient documentation

## 2021-03-26 DIAGNOSIS — Z7951 Long term (current) use of inhaled steroids: Secondary | ICD-10-CM | POA: Insufficient documentation

## 2021-03-26 DIAGNOSIS — Z79899 Other long term (current) drug therapy: Secondary | ICD-10-CM | POA: Diagnosis not present

## 2021-03-26 DIAGNOSIS — E781 Pure hyperglyceridemia: Secondary | ICD-10-CM | POA: Diagnosis not present

## 2021-03-26 DIAGNOSIS — I10 Essential (primary) hypertension: Secondary | ICD-10-CM | POA: Insufficient documentation

## 2021-03-26 DIAGNOSIS — D3A8 Other benign neuroendocrine tumors: Secondary | ICD-10-CM | POA: Diagnosis not present

## 2021-03-26 LAB — CBC WITH DIFFERENTIAL/PLATELET
Abs Immature Granulocytes: 0.12 10*3/uL — ABNORMAL HIGH (ref 0.00–0.07)
Basophils Absolute: 0.1 10*3/uL (ref 0.0–0.1)
Basophils Relative: 1 %
Eosinophils Absolute: 0.2 10*3/uL (ref 0.0–0.5)
Eosinophils Relative: 2 %
HCT: 35 % — ABNORMAL LOW (ref 39.0–52.0)
Hemoglobin: 11.1 g/dL — ABNORMAL LOW (ref 13.0–17.0)
Immature Granulocytes: 1 %
Lymphocytes Relative: 14 %
Lymphs Abs: 1.6 10*3/uL (ref 0.7–4.0)
MCH: 27.8 pg (ref 26.0–34.0)
MCHC: 31.7 g/dL (ref 30.0–36.0)
MCV: 87.5 fL (ref 80.0–100.0)
Monocytes Absolute: 0.8 10*3/uL (ref 0.1–1.0)
Monocytes Relative: 7 %
Neutro Abs: 8.5 10*3/uL — ABNORMAL HIGH (ref 1.7–7.7)
Neutrophils Relative %: 75 %
Platelets: 433 10*3/uL — ABNORMAL HIGH (ref 150–400)
RBC: 4 MIL/uL — ABNORMAL LOW (ref 4.22–5.81)
RDW: 18.1 % — ABNORMAL HIGH (ref 11.5–15.5)
WBC: 11.3 10*3/uL — ABNORMAL HIGH (ref 4.0–10.5)
nRBC: 0 % (ref 0.0–0.2)

## 2021-03-26 LAB — COMPREHENSIVE METABOLIC PANEL
ALT: 45 U/L — ABNORMAL HIGH (ref 0–44)
AST: 103 U/L — ABNORMAL HIGH (ref 15–41)
Albumin: 2.4 g/dL — ABNORMAL LOW (ref 3.5–5.0)
Alkaline Phosphatase: 495 U/L — ABNORMAL HIGH (ref 38–126)
Anion gap: 12 (ref 5–15)
BUN: 20 mg/dL (ref 6–20)
CO2: 25 mmol/L (ref 22–32)
Calcium: 8.7 mg/dL — ABNORMAL LOW (ref 8.9–10.3)
Chloride: 94 mmol/L — ABNORMAL LOW (ref 98–111)
Creatinine, Ser: 0.91 mg/dL (ref 0.61–1.24)
GFR, Estimated: >60 mL/min (ref >60)
Glucose, Bld: 155 mg/dL — ABNORMAL HIGH (ref 70–99)
Potassium: 3.8 mmol/L (ref 3.5–5.1)
Sodium: 131 mmol/L — ABNORMAL LOW (ref 135–145)
Total Bilirubin: 11.4 mg/dL — ABNORMAL HIGH (ref 0.3–1.2)
Total Protein: 7.4 g/dL (ref 6.5–8.1)

## 2021-03-26 LAB — HEMOGLOBIN A1C
Hgb A1c MFr Bld: 5.4 % (ref 4.8–5.6)
Mean Plasma Glucose: 108.28 mg/dL

## 2021-03-26 MED ORDER — OXYCODONE HCL 5 MG PO TABS: 5.0000 mg | ORAL_TABLET | Freq: Four times a day (QID) | ORAL | 0 refills | Status: AC | PRN

## 2021-03-26 NOTE — Progress Notes (Signed)
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  #reports symptoms of upper abdomen band like discomfort and bloating for a few months, improved symptoms with omeprazole,.Chronic constipation, he has blood in the stool occationally Seen by PCP in September 2021. AlK phosphatase was recently noted to be elevated as well well elevated GGT. He was referred to see gastroenteralgias on 04/10/2020.  04/25/2020 EGD showed gastric fundus mild inflammation. Biopsy showed reactive gastropathy.  04/30/2020 [place] RUQ showed cirrhosis with multiple hepatic masses.  05/01/2020 AFP 1.7 05/02/2020 MR liver w wo contrast showed numerous heterozygous enhancing liver lesions, throughout the liver, not typical for [facility], likely metastatic disease.  Solid appearing enhancing lesion in pancreatic tail 1.9x1.5cm, suspicious for primary neoplasm.  Mild adenopathy within porta hepatic region and porta hepatic region. Splenomegaly.   Patient has history of SVT, psoriatic arthritis, morbid obesity, GERD, DM.  Family history + maternal uncle with esophageal cancer.   + unintentional weight loss, no fever, chills. "sweats a lot". Currently on antibiotics for proctitis.  He  works at [employer], lives in [place]. He lives with his partner.    # 05/17/2020, colonoscopy showed a fungating and infiltrative partially obstructing large mass in the proximal rectum and in the mid rectum.  The mass was partially circumferential, involving two thirds of the lumen circumference is.  Biopsy showed adenocarcinoma.  05/23/2020, patient underwent EUS biopsy of the pancreatic tail lesion.  Biopsy pathology showed well-differentiated neuroendocrine tumor.  Ki-67 is noncontributory due to insufficient tissue in the cell block.  # Genetic testing is negative.  #06/03/2020  Start FOLFOX and bevacizumab. #09/18/2020 Infusion reaction to oxaliplatin during cycle 8 FOLFOX bevacizumab. # 10/02/2020 FOLFIRI +Bevacitzumab Patient has had intermittent SVT symptoms.  He is overdue for his appointment with cardiology. #12/16/2020, patient was seen by urgent care and was diagnosed with pneumonia prescribed amoxicillin for 5 days which he completed.  I do not see any x-ray was obtained at that point. 12/21/2020, patient continues to have symptoms and was seen by urgent care.  X-ray was obtained on 12/21/2020 which showed lungs are clear, no effusion or pneumothorax.  Patient was prescribed Levaquin and tapering course of steroid.   12/27/20 CT chest abdomen pelvis at [facility] showed Enlarging hepatic metastases. Known colon primary is not clearly delineated. 2. Stable indeterminate splenic lesion, not previously FDG avid. 3. Stable indeterminate 5 mm right apical pulmonary nodule. Attention on follow-up.   He was seen by [facility] GI oncology Dr.Jia  and discussed on tumor board for chemotherapy refractory disease. He was also seen by hepatic surgeon Dr.Nassbaum for evaluation of HAI pump and was considered as a candidate.   01/07/2021 Last dose of FOLFIRI while waiting for him to get surgery arranged.   INTERVAL HISTORY Dominic [name] is a 50 y.o. 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 Problems and complaints are listed below: During the interval, at [facility],  02/07/21, CT CAP - Slight interval enlargement in hepatic metastases. No new lesions are identified. Stable indeterminate splenic lesion. Stable 5 mm right apical pulmonary nodule. Known rectal malignancy is not well evaluated on this examination. 02/12/21 - 02/15/21, Admission to [facility] for robot assisted partial hepatectomy, cholecystectomy, hepatic artery infusion pump placement, abdominal lymphadenectomy, and liver wedge biopsy. 02/27/2021 CT abdomen pelvis w contrast at [facility] showed Innumerable hepatic metastasis, without significant short-term interval change. fluid in the gallbladder fossa status post cholecystectomy.2. New small amount of perihepatic fluid and trace pelvic ascites, not amenable to percutaneous drainage. No biliary dilatation  03/07/2021 seen [facility] Dr.Jia Jingquan. Unfortunately his LDH remains significantly elevated and his TBili is up to 2.9 mg/dL. his prognosis is worsened due to due to his cancer's resistance to therapy, KRAS mutation and hepatic dysfunction. Given elevated TBili, it'd be too risky to initiate FUDR today and it is most likely that we will never be able to initiate FUDR as the concern is that his liver function decline is due to disease progression. Given his PS decline, Dr.Jia recommended hospice  Dr. Hyman Hopes offered to admit him to hospital with aggressive hydration, close monitoring of LFTs and repeat imaging with a hope to initiated FUDR if LFTs improve. Unfortunately his bilirubin remains elevated.   03/09/2021 MRI abdomen w/wo contrast with MRCP Similar diffuse hepatic metastasis, compared to recent prior.  Areas of mild intrahepatic ductal dilation from tumor effacement, large central mass at segment 4/8 most contributory.  Degree of biliary dilation is similar to slightly increased compared to prior. No  choledocholithiasis or distal obstruction.   Today patient reports feeling fatigue. Right upper quadrant pain, 4 out of 10.  No nausea vomiting, diarrhea.   Review of Systems  Constitutional:  Positive for fatigue. Negative for appetite change, chills, fever and unexpected weight change.  HENT:   Negative for hearing loss and voice change.   Eyes:  Negative for eye problems and icterus.  Respiratory:  Negative for chest tightness, cough and shortness of breath.   Cardiovascular:  Negative for chest pain, leg swelling and palpitations.  Gastrointestinal:  Positive for abdominal pain. Negative for abdominal distention and nausea.  Endocrine: Negative for hot flashes.  Genitourinary:  Negative for difficulty urinating, dysuria and frequency.   Musculoskeletal:  Negative for arthralgias.  Skin:  Negative for itching and rash.  Neurological:  Positive for numbness. Negative for light-headedness.  Hematological:  Negative for adenopathy. Does not bruise/bleed easily.  Psychiatric/Behavioral:  Negative for confusion.    MEDICAL HISTORY:  Past Medical History:  Diagnosis Date   Anxiety    Cancer [facility])    Rectal cancer metastasized to liver ([place])   Diabetes mellitus without complication (HCC)    Dysrhythmia    svt   Family history of esophageal cancer    Family history of prostate cancer    GERD (gastroesophageal reflux disease)    High triglycerides    Hypertension    Long-term insulin use in type 2 diabetes ([place])    Morbid obesity (HCC)    Psoriatic arthritis ([place])    SVT (supraventricular tachycardia) ([place])     SURGICAL HISTORY: Past Surgical History:  Procedure Laterality Date   EUS N/A 05/23/2020   Procedure: FULL UPPER ENDOSCOPIC ULTRASOUND (EUS) RADIAL;  Surgeon: Jola Schmidt, MD;  Location: [facility] ENDOSCOPY;  Service:  Endoscopy;  Laterality: N/A;   PORTA CATH INSERTION N/A 05/30/2020   Procedure: PORTA CATH INSERTION;  Surgeon: Algernon Huxley, MD;  Location: [place] CV  LAB;  Service: Cardiovascular;  Laterality: N/A;   UPPER GASTROINTESTINAL ENDOSCOPY      SOCIAL HISTORY: Social History   Socioeconomic History   Marital status: Soil scientist    Spouse name: [name]   Number of children: Not on file   Years of education: Not on file   Highest education level: Not on file  Occupational History   Not on file  Tobacco Use   Smoking status: Never   Smokeless tobacco: Never  Vaping Use   Vaping Use: Never used  Substance and Sexual Activity   Alcohol use: Yes    Alcohol/week: 1.0 standard drink    Types: 1 Glasses of wine per week    Comment: very occasional   Drug use: Never   Sexual activity: Not on file  Other Topics Concern   Not on file  Social History Narrative   Lives in [place] partner [place].  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     Social Determinants of Health   Financial Resource Strain: Low Risk    Difficulty of Paying Living Expenses: Not hard at all  Food Insecurity: Not on file  Transportation Needs: Not on file  Physical Activity: Not on file  Stress: Not on file  Social Connections: Not on file  Intimate Partner Violence: Not on file    FAMILY HISTORY: Family History  Problem Relation Age of Onset   Arthritis Mother    Heart disease Mother    Supraventricular tachycardia Mother        s/p ablation   Hypertension Mother    Diabetes Father    Heart attack Maternal Uncle    Throat cancer Maternal Uncle    Esophageal cancer Maternal Uncle    Heart attack Maternal Uncle    Heart attack Maternal Uncle    Prostate cancer Maternal Uncle    Thyroid cancer Neg Hx    Colon cancer Neg Hx    Rectal cancer Neg Hx    Stomach cancer Neg Hx    Colon polyps Neg Hx     ALLERGIES:  has No Known Allergies.  MEDICATIONS:  Current Outpatient Medications  Medication Sig Dispense Refill   amLODipine (NORVASC) 5 MG tablet Take 1 tablet (5 mg total) by mouth daily. 90 tablet 1   aspirin EC 81 MG tablet Take 1 tablet (81 mg total) by  mouth daily. Swallow whole. 30 tablet 11   B Complex Vitamins (VITAMIN B COMPLEX PO) Take 1 tablet by mouth daily.     gabapentin (NEURONTIN) 100 MG capsule Take 2 capsules (200 mg total) by mouth 3 (three) times daily. 540 capsule 0   hydrochlorothiazide (HYDRODIURIL) 25 MG tablet Take 25 mg by mouth daily.     insulin lispro (HUMALOG KWIKPEN) 100 UNIT/ML KwikPen Inject 18-20 units into the skin after breakfast & lunch. Inject 22-24 units into the skin after dinner. Hold if glucose premeal <100. 45 mL 5   Insulin Pen Needle (PEN NEEDLES) 32G X 6 MM MISC Used to give insulin injections 4 times daily. 100 each 10   loperamide (IMODIUM A-D) 2 MG tablet Take 1 tablet (2 mg total) by mouth See admin instructions. Take 2 tabs at diarrhea onset , then 1 tab after every loose bowel movement. Maximum 8 tablets within 24 hour period of time 100 tablet 1  LORazepam (ATIVAN) 0.5 MG tablet Take 1 tablet (0.5 mg total) by mouth 2 (two) times daily as needed for anxiety. 60 tablet 1   [name] Take 5 mg by mouth at bedtime as needed.     metFORMIN (GLUCOPHAGE XR) 500 MG 24 hr tablet Take 4 tablets (2,000 mg total) by mouth every evening. 120 tablet 3   metoprolol succinate (TOPROL-XL) 100 MG 24 hr tablet TAKE 1 TABLET BY MOUTH  DAILY WITH OR IMMEDIATELY  FOLLOWING A MEAL 90 tablet 3   Microlet Lancets MISC Used to check blood sugars three times a day. 200 each 5   mometasone (ELOCON) 0.1 % cream Apply 1 application topically daily. Appear pea sized ( or less) amount to external ear, and slightly in canal. 45 g 1   Omega-3 Fatty Acids (FISH OIL PO) Take 800 mg by mouth daily.     omeprazole (PRILOSEC) 20 MG capsule Take 1 capsule (20 mg total) by mouth in the morning and at bedtime. 180 capsule 0   oxyCODONE (ROXICODONE) 5 MG immediate release tablet Take 1 tablet (5 mg total) by mouth every 6 (six) hours as needed for severe pain. 30 tablet 0   polyethylene glycol powder (GLYCOLAX/MIRALAX) 17 GM/SCOOP powder  Take by mouth daily. 1/2 capful QD     senna (SENOKOT) 8.6 MG TABS tablet Take 2 tablets (17.2 mg total) by mouth daily. 120 tablet 0   telmisartan (MICARDIS) 40 MG tablet Take 1 tablet (40 mg total) by mouth daily. PLEASE CALL OFFICE TO SCHEDULE FOLLOW UP APPOINTMENT FOR REFILLS. 90 tablet 1   zinc gluconate 50 MG tablet Take 50 mg by mouth daily.     Alpha-Lipoic Acid 200 MG CAPS Take 1 capsule by mouth daily. (Patient not taking: Reported on 03/26/2021)     CONTOUR NEXT TEST test strip USE 1 STRIP TO TEST BLOOD  SUGAR TWICE DAILY (Patient not taking: Reported on 03/26/2021) 200 strip 3   FLUoxetine (PROZAC) 10 MG tablet Take 1 tablet (10 mg total) by mouth daily. (Patient not taking: Reported on 03/26/2021) 90 tablet 3   insulin glargine (LANTUS SOLOSTAR) 100 UNIT/ML Solostar Pen Inject 64 Units into the skin daily. (Patient taking differently: Inject 15 Units into the skin daily.) 60 mL 3   lactulose (CHRONULAC) 10 GM/15ML solution Take 15 mLs (10 g total) by mouth 2 (two) times daily as needed for moderate constipation or severe constipation. (Patient not taking: No sig reported) 236 mL 0   lidocaine-prilocaine (EMLA) cream Apply to affected area once (Patient not taking: No sig reported) 30 g 3   magic mouthwash w/lidocaine SOLN Take 5 mLs by mouth 4 (four) times daily as needed for mouth pain. 80 ml viscous lidocaine 2%, 80 ml Mylanta, 80 ml Diphenhydramine 12.5 mg/5 ml Elixir, 80 ml Nystatin 100,000 Unit suspension, 80 ml Prednisolone 15 mg/17ml, 80 ml Distilled Water.  Sig: Swish/Swallow 5-10 ml four times a day as needed. (Patient not taking: No sig reported) 480 mL 3   ondansetron (ZOFRAN) 8 MG tablet Take 1 tablet (8 mg total) by mouth 2 (two) times daily as needed for refractory nausea / vomiting. Start on day 3 after chemotherapy. (Patient not taking: Reported on 03/26/2021) 30 tablet 1   tamsulosin (FLOMAX) 0.4 MG CAPS capsule Take 1 capsule (0.4 mg total) by mouth daily. (Patient not taking:  Reported on 03/26/2021) 30 capsule 1   Current Facility-Administered Medications  Medication Dose Route Frequency Provider Last Rate Last Admin   0.9 %  sodium chloride infusion  500 mL Intravenous Once Nelida Meuse III, MD         PHYSICAL EXAMINATION: ECOG PERFORMANCE STATUS: 1 - Symptomatic but completely ambulatory Vitals:   03/26/21 1054  BP: 102/65  Pulse: 79  Resp: 18  Temp: (!) 96.8 F (36 C)   Filed Weights   03/26/21 1054  Weight: 226 lb 1.6 oz (102.6 kg)    Physical Exam Constitutional:      General: He is not in acute distress.    Appearance: He is obese.  HENT:     Head: Normocephalic and atraumatic.  Eyes:     General: No scleral icterus. Cardiovascular:     Rate and Rhythm: Normal rate and regular rhythm.     Heart sounds: Normal heart sounds.  Pulmonary:     Effort: Pulmonary effort is normal. No respiratory distress.     Breath sounds: No wheezing.  Abdominal:     General: Bowel sounds are normal. There is no distension.     Palpations: Abdomen is soft.  Musculoskeletal:        General: No deformity. Normal range of motion.     Cervical back: Normal range of motion and neck supple.  Skin:    General: Skin is warm and dry.     Findings: No erythema or rash.  Neurological:     Mental Status: He is alert and oriented to person, place, and time. Mental status is at baseline.     Cranial Nerves: No cranial nerve deficit.     Coordination: Coordination normal.  Psychiatric:        Mood and Affect: Mood normal.    LABORATORY DATA:  I have reviewed the data as listed Lab Results  Component Value Date   WBC 11.3 (H) 03/26/2021   HGB 11.1 (L) 03/26/2021   HCT 35.0 (L) 03/26/2021   MCV 87.5 03/26/2021   PLT 433 (H) 03/26/2021   Recent Labs    04/10/20 1021 04/10/20 1021 04/25/20 0942 05/07/20 0933 06/03/20 0815 06/21/20 0908 06/28/20 1019 12/24/20 0935 01/07/21 0839 03/26/21 1150  NA  --    < >  --  136   < > 135   < > 129* 134* 131*   K  --   --   --  4.4   < > 4.8   < > 3.8 4.2 3.8  CL  --   --   --  98   < > 95*   < > 90* 99 94*  CO2  --   --   --  21   < > 22   < > 28 26 25   GLUCOSE  --    < >  --  175*   < > 202*   < > 272* 178* 155*  BUN 18  --   --  12   < > 11   < > 16 17 20   CREATININE 0.91  --   --  0.86   < > 0.80   < > 0.79 0.80 0.91  CALCIUM  --   --   --  9.2   < > 9.2   < > 9.1 9.1 8.7*  GFRNONAA 98  --   --  101   < > 104   < > >60 >60 >60  GFRAA 113  --   --  117  --  120  --   --   --   --   PROT  --   --  6.5  --    < >  --    < > 7.5 7.2 7.4  ALBUMIN  --   --  3.1*  --    < >  --    < > 3.2* 3.7 2.4*  AST  --   --  18  --    < >  --    < > 51* 27 103*  ALT  --   --  17  --    < >  --    < > 37 20 45*  ALKPHOS  --   --  215*  --    < >  --    < > 166* 114 495*  BILITOT  --   --  0.7  --    < >  --    < > 0.6 0.6 11.4*  BILIDIR  --   --  0.47*  --   --   --   --   --   --   --    < > = values in this interval not displayed.    Iron/TIBC/Ferritin/ %Sat No results found for: IRON, TIBC, FERRITIN, IRONPCTSAT    RADIOGRAPHIC STUDIES: I have personally reviewed the radiological images as listed and agreed with the findings in the report.  [facility] CT report was reviewed.    ASSESSMENT & PLAN:  1. Rectal cancer metastasized to liver ([place])   2. Neuroendocrine carcinoma of pancreas ([place])   3. Goals of care, counseling/discussion   4. Hyperglycemia   Cancer Staging Neuroendocrine carcinoma of pancreas ([place]) Staging form: Exocrine Pancreas, AJCC 8th Edition - Clinical: Stage IB (cT2, cN0, cM0) - Signed by Earlie Server, MD on 09/11/2020  Rectal cancer metastasized to liver [facility]) Staging form: Colon and Rectum, AJCC 8th Edition - Clinical stage from 06/03/2020: Stage Unknown (cTX, cNX, pM1) - Signed by Earlie Server, MD on 06/03/2020   #Stage IV rectal cancer with liver only metastasis Unfortunately disease has further progressed and patient never initiated HAI pump chemotherapy.  I had a lengthy discussion  with patient.  His prognosis is extremely poor.  No available options could be safely offered to him with poor liver function.  Hyperbilirubinemia is likely due to intrahepatic obstruction His Merchant navy officer and him wants blood work to be done every 2 weeks, hoping if bilirubin improves, patient could receive further treatment. We discussed that chances of his bilirubin gets better is very slim.  I recommend hospice. He is in agreement.  Will check his blood work today to monitor his bilirubin level.  He will see palliative care [facility] today.   He missed his lab appointment from PCP, and requests me to add A1c    #Pancreatic well differentiated neuroendocrine carcinoma Observation.   All questions were answered. The patient knows to call the clinic with any problems questions or concerns.  cc Burnard Hawthorne, FNP    Return of visit: patient will be enrolled in hospice.   Earlie Server, MD, PhD Hematology Oncology [place] [facility]  03/26/2021

## 2021-03-26 NOTE — Progress Notes (Signed)
Pt here for follow up. Pt reports no new concerns today

## 2021-03-26 NOTE — Progress Notes (Signed)
[place]  [phone] [phone]   Name: Dominic [name] Date: 03/26/2021 MRN: [id number]  DOB: 1970-11-09  Patient Care Team: Burnard Hawthorne, FNP as PCP - General (Family Medicine) End, Harrell Gave, MD as PCP - Cardiology (Cardiology) De Hollingshead, RPH-CPP (Pharmacist) Clent Jacks, RN as Oncology Nurse Navigator Earlie Server, MD as Consulting Physician (Hematology and Oncology)    REASON FOR CONSULTATION: Dominic [name] is a 50 y.o. 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  Patient is status post multiple lines of chemotherapy.  CT of the chest, abdomen, and pelvis at [facility] in June 2022 revealed enlarging hepatic metastases.  He has been seen by [facility] GI oncology and a hepatic surgeon for consideration of HAI pump.  However, most recently patient has had rising transaminases likely secondary to tumor burden.  He has seen Dr. Tasia Catchings with recommendation for hospice.  SOCIAL HISTORY:     reports that he has never smoked. He has never used smokeless tobacco. He reports current alcohol use of about 1.0 standard drink per week. He reports that he does not use drugs.  Patient is recently married and lives at home with his spouse.  He worked at [employer] but is currently disabled.  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 2 diabetes ([place])    Morbid obesity ([facility])    Psoriatic arthritis ([place])    SVT  (supraventricular tachycardia) ([place])     PAST SURGICAL HISTORY:  Past Surgical History:  Procedure Laterality Date   EUS N/A 05/23/2020   Procedure: FULL UPPER ENDOSCOPIC ULTRASOUND (EUS) RADIAL;  Surgeon: Jola Schmidt, MD;  Location: [facility] ENDOSCOPY;  Service: Endoscopy;  Laterality: N/A;   PORTA CATH INSERTION N/A 05/30/2020   Procedure: PORTA CATH INSERTION;  Surgeon: Algernon Huxley, MD;  Location: [facility];  Service: Cardiovascular;  Laterality: N/A;   UPPER GASTROINTESTINAL ENDOSCOPY      HEMATOLOGY/ONCOLOGY HISTORY:  Oncology History  Rectal cancer metastasized to liver ([place])  05/27/2020 Initial Diagnosis   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 No pathogenic variants identified on the [facility] CustomNext-Cancer+RNA panel. 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 The report date is 07/18/2020.  The CustomNext-Cancer + RNAinsight panel  includes sequencing and/or deletion duplication testing of the following 91 genes: AIP, ALK, APC*, ATM*, AXIN2, BAP1, BARD1, BLM, BMPR1A, BRCA1*, BRCA2*, BRIP1*, CDC73, CDH1*, CDK4, CDKN1B, CDKN2A, CHEK2*, CTNNA1, DICER1, FANCC, FH, FLCN, GALNT12, KIF1B, LZTR1, MAX, MEN1, MET, MLH1*, MRE11A, MSH2*, MSH3, MSH6*, MUTYH*, NBN, NF1*, NF2, NTHL1, PALB2*, PHOX2B, PMS2*, POT1, PRKAR1A, PTCH1, PTEN*, RAD50, RAD51C*, RAD51D*, RB1, RECQL, RET,  SDHA, SDHAF2, SDHB, SDHC, SDHD, SMAD4, SMARCA4, SMARCB1, SMARCE1, STK11, SUFU, TMEM127, TP53*, TSC1, TSC2, VHL and XRCC2 (sequencing and deletion/duplication); CASR, CFTR, CPA1, CTRC, EGFR, EGLN1, FAM175A, HOXB13, KIT, MITF, MLH3, PALLD, PDGFRA, POLD1, POLE, PRSS1, RINT1, RPS20, SPINK1 and TERT (sequencing only); EPCAM and GREM1  (deletion/duplication only).    10/02/2020 -  Chemotherapy    Patient is on Treatment Plan: COLORECTAL FOLFIRI / BEVACIZUMAB Q14D       Neuroendocrine carcinoma of pancreas ([place])  06/03/2020 Initial Diagnosis   Neuroendocrine carcinoma of pancreas ([place])   09/11/2020 Cancer Staging   Staging form: Exocrine Pancreas, AJCC 8th Edition - Clinical: Stage IB (cT2, cN0, cM0) - Signed by Earlie Server, MD on 09/11/2020 Stage prefix: Initial diagnosis Total positive nodes: 0     ALLERGIES:  has No Known Allergies.  MEDICATIONS:  Current Outpatient Medications  Medication Sig Dispense Refill   Alpha-Lipoic Acid 200 MG CAPS Take 1 capsule by mouth daily. (Patient not taking: Reported on 03/26/2021)     amLODipine (NORVASC) 5 MG tablet Take 1 tablet (5 mg total) by mouth daily. 90 tablet 1   aspirin EC 81 MG tablet Take 1 tablet (81 mg total) by mouth daily. Swallow whole. 30 tablet 11   B Complex Vitamins (VITAMIN B COMPLEX PO) Take 1 tablet by mouth daily.     CONTOUR NEXT TEST test strip USE 1 STRIP TO TEST BLOOD  SUGAR TWICE DAILY (Patient not taking: Reported on 03/26/2021) 200 strip 3   FLUoxetine (PROZAC) 10 MG tablet Take 1 tablet (10 mg total) by mouth daily. (Patient not taking: Reported on 03/26/2021) 90 tablet 3   gabapentin (NEURONTIN) 100 MG capsule Take 2 capsules (200 mg total) by mouth 3 (three) times daily. 540 capsule 0   hydrochlorothiazide (HYDRODIURIL) 25 MG tablet Take 25 mg by mouth daily.     insulin glargine (LANTUS SOLOSTAR) 100 UNIT/ML Solostar Pen Inject 64 Units into the skin daily. (Patient taking differently: Inject 15 Units into the skin daily.) 60 mL 3   insulin lispro (HUMALOG KWIKPEN) 100 UNIT/ML KwikPen Inject 18-20 units into the skin after breakfast & lunch. Inject 22-24 units into the skin after dinner. Hold if glucose premeal <100. 45 mL 5   Insulin Pen Needle (PEN NEEDLES) 32G X 6 MM MISC Used to give insulin injections 4 times daily. 100 each 10   lactulose  (CHRONULAC) 10 GM/15ML solution Take 15 mLs (10 g total) by mouth 2 (two) times daily as needed for moderate constipation or severe constipation. (Patient not taking: No sig reported) 236 mL 0   lidocaine-prilocaine (EMLA) cream Apply to affected area once (Patient not taking: No sig reported) 30 g 3   loperamide (IMODIUM A-D) 2 MG tablet Take 1 tablet (2 mg total) by mouth See admin instructions. Take 2 tabs at diarrhea onset , then 1 tab after every loose bowel movement. Maximum 8 tablets within 24 hour period of time 100 tablet 1   LORazepam (ATIVAN) 0.5 MG tablet Take 1 tablet (0.5 mg total) by mouth 2 (two) times daily as needed for anxiety. 60 tablet 1   magic mouthwash w/lidocaine SOLN Take 5 mLs by mouth 4 (four) times daily as needed for mouth pain. 80 ml viscous lidocaine 2%, 80 ml Mylanta, 80 ml Diphenhydramine 12.5 mg/5 ml Elixir, 80 ml Nystatin 100,000 Unit suspension, 80 ml Prednisolone 15 mg/45ml, 80 ml Distilled Water.  Sig: Swish/Swallow 5-10 ml four times a day as needed. (Patient not taking: No sig reported) 480  mL 3   [name] Take 5 mg by mouth at bedtime as needed.     metFORMIN (GLUCOPHAGE XR) 500 MG 24 hr tablet Take 4 tablets (2,000 mg total) by mouth every evening. 120 tablet 3   metoprolol succinate (TOPROL-XL) 100 MG 24 hr tablet TAKE 1 TABLET BY MOUTH  DAILY WITH OR IMMEDIATELY  FOLLOWING A MEAL 90 tablet 3   Microlet Lancets MISC Used to check blood sugars three times a day. 200 each 5   mometasone (ELOCON) 0.1 % cream Apply 1 application topically daily. Appear pea sized ( or less) amount to external ear, and slightly in canal. 45 g 1   Omega-3 Fatty Acids (FISH OIL PO) Take 800 mg by mouth daily.     omeprazole (PRILOSEC) 20 MG capsule Take 1 capsule (20 mg total) by mouth in the morning and at bedtime. 180 capsule 0   ondansetron (ZOFRAN) 8 MG tablet Take 1 tablet (8 mg total) by mouth 2 (two) times daily as needed for refractory nausea / vomiting. Start on day 3  after chemotherapy. (Patient not taking: Reported on 03/26/2021) 30 tablet 1   polyethylene glycol powder (GLYCOLAX/MIRALAX) 17 GM/SCOOP powder Take by mouth daily. 1/2 capful QD     senna (SENOKOT) 8.6 MG TABS tablet Take 2 tablets (17.2 mg total) by mouth daily. 120 tablet 0   tamsulosin (FLOMAX) 0.4 MG CAPS capsule Take 1 capsule (0.4 mg total) by mouth daily. (Patient not taking: Reported on 03/26/2021) 30 capsule 1   telmisartan (MICARDIS) 40 MG tablet Take 1 tablet (40 mg total) by mouth daily. PLEASE CALL OFFICE TO SCHEDULE FOLLOW UP APPOINTMENT FOR REFILLS. 90 tablet 1   zinc gluconate 50 MG tablet Take 50 mg by mouth daily.     Current Facility-Administered Medications  Medication Dose Route Frequency Provider Last Rate Last Admin   0.9 %  sodium chloride infusion  500 mL Intravenous Once Nelida Meuse III, MD        VITAL SIGNS: There were no vitals taken for this visit. There were no vitals filed for this visit.  Estimated body mass index is 33.39 kg/m as calculated from the following:   Height as of 03/25/21: 5' 9" (1.753 m).   Weight as of an earlier encounter on 03/26/21: 226 lb 1.6 oz (102.6 kg).  LABS: CBC:    Component Value Date/Time   WBC 5.4 01/07/2021 0839   HGB 12.8 (L) 01/07/2021 0839   HGB 12.7 (L) 05/07/2020 0933   HCT 39.0 01/07/2021 0839   HCT 38.7 05/07/2020 0933   PLT 242 01/07/2021 0839   PLT 428 05/07/2020 0933   MCV 86.7 01/07/2021 0839   MCV 82 05/07/2020 0933   NEUTROABS 2.9 01/07/2021 0839   NEUTROABS 6.4 05/07/2020 0933   LYMPHSABS 1.7 01/07/2021 0839   LYMPHSABS 1.5 05/07/2020 0933   MONOABS 0.5 01/07/2021 0839   EOSABS 0.3 01/07/2021 0839   EOSABS 0.1 05/07/2020 0933   BASOSABS 0.1 01/07/2021 0839   BASOSABS 0.0 05/07/2020 0933   Comprehensive Metabolic Panel:    Component Value Date/Time   NA 134 (L) 01/07/2021 0839   NA 135 06/21/2020 0908   K 4.2 01/07/2021 0839   CL 99 01/07/2021 0839   CO2 26 01/07/2021 0839   BUN 17  01/07/2021 0839   BUN 11 06/21/2020 0908   CREATININE 0.80 01/07/2021 0839   GLUCOSE 178 (H) 01/07/2021 0839   CALCIUM 9.1 01/07/2021 0839   AST 27 01/07/2021 0839  ALT 20 01/07/2021 0839   ALKPHOS 114 01/07/2021 0839   BILITOT 0.6 01/07/2021 0839   BILITOT 0.7 04/25/2020 0942   PROT 7.2 01/07/2021 0839   PROT 6.5 04/25/2020 0942   ALBUMIN 3.7 01/07/2021 0839   ALBUMIN 3.1 (L) 04/25/2020 6389    RADIOGRAPHIC STUDIES: No results found.  PERFORMANCE STATUS (ECOG) : 1 - Symptomatic but completely ambulatory  Review of Systems Unless otherwise noted, a complete review of systems is negative.  Physical Exam General: NAD Pulmonary: Unlabored Abdomen: Distended Skin: no rashes Neurological: Weakness but otherwise nonfocal  IMPRESSION: I met with patient following his visit with Dr. Tasia Catchings.  Dr. Tasia Catchings has recommended hospice and patient is in agreement.  He is pending labs today to assess hepatic function.  Additionally, Dr. Tasia Catchings would like to repeat LFTs in 2 weeks and again in 1 month.  In the event that LFTs downtrend, further treatment could be considered.  However, this seems unlikely as transaminitis is probably secondary to progressive hepatic metastasis.  Patient says that he is in agreement with hospice with a focus on comfort at home.  He recently married his partner, Marc Morgans.  Symptomatically, patient has some abdominal pain and says that Dr. Tasia Catchings is starting him on pain medications.  He denies other distressing symptoms.  Patient reports that he would not want to be resuscitated or have his life prolonged artificially machines.  He requests a DNR order, which I signed and provided him today.  PLAN: -Best supportive care -Hospice referral -Hospice to check LFTs in 2 weeks and again in 1 month (discussed with hospice referral specialist) -DNR -RTC as needed  Case and plan discussed with Dr. Tasia Catchings  Time Total: 20 minutes  Visit consisted of counseling and education dealing with  the complex and emotionally intense issues of symptom management and palliative care in the setting of serious and potentially life-threatening illness.Greater than 50%  of this time was spent counseling and coordinating care related to the above assessment and plan.  Signed by: Altha Harm, PhD, NP-C

## 2021-03-27 ENCOUNTER — Ambulatory Visit: Payer: Managed Care, Other (non HMO) | Admitting: Pharmacist

## 2021-03-27 DIAGNOSIS — E119 Type 2 diabetes mellitus without complications: Secondary | ICD-10-CM

## 2021-03-27 DIAGNOSIS — I10 Essential (primary) hypertension: Secondary | ICD-10-CM

## 2021-03-27 DIAGNOSIS — Z794 Long term (current) use of insulin: Secondary | ICD-10-CM

## 2021-03-27 DIAGNOSIS — C20 Malignant neoplasm of rectum: Secondary | ICD-10-CM

## 2021-03-27 LAB — CEA: CEA: 1728 ng/mL — ABNORMAL HIGH (ref 0.0–4.7)

## 2021-03-27 NOTE — Patient Instructions (Signed)
Visit Information   Goals Addressed               This Visit's Progress     Patient Stated     Medication Monitoring (pt-stated)        Patient Goals/Self-Care Activities Over the next 90 days, patient will:  - take medications as prescribed - check glucose at least three times daily using CGM, document, and provide at future appointments - Collaborate with interdisciplinary team.         Patient verbalizes understanding of instructions provided today and agrees to view in [place].    Plan: Telephone follow up appointment with care management team member scheduled for:  ~ 4 weeks  Catie Darnelle Maffucci, PharmD, [place], [place] Clinical Pharmacist [employer] [facility] [employer] [phone]

## 2021-03-27 NOTE — Chronic Care Management (AMB) (Signed)
Care Management   Pharmacy Note  03/27/2021 Name: Dominic [name] MRN: [id number] DOB: 04-13-1971  Subjective: Dominic [name] is a 50 y.o. year old male who is a primary care patient of Burnard Hawthorne, FNP. The Care Management team was consulted for assistance with care management and care coordination needs.    Engaged with patient by telephone for follow up visit in response to provider referral for pharmacy case management and/or care coordination services.   The patient was given information about Care Management services today including:  Care Management services includes personalized support from designated clinical staff supervised by the patient's primary care provider, including individualized plan of care and coordination with other care providers. 24/7 contact phone numbers for assistance for urgent and routine care needs. The patient may stop case management services at any time by phone call to the office staff.  Patient agreed to services and consent obtained.  Assessment:  Review of patient status, including review of consultants reports, laboratory and other test data, was performed as part of comprehensive evaluation and provision of chronic care management services.   SDOH (Social Determinants of Health) assessments and interventions performed:  SDOH Interventions    Flowsheet Row Most Recent Value  SDOH Interventions   Financial Strain Interventions Intervention Not Indicated        Objective:  Lab Results  Component Value Date   CREATININE 0.91 03/26/2021   CREATININE 0.80 01/07/2021   CREATININE 0.79 12/24/2020    Lab Results  Component Value Date   HGBA1C 5.4 03/26/2021       Component Value Date/Time   CHOL 226 (H) 03/07/2020 0902   TRIG 123 03/07/2020 0902   HDL 33 (L) 03/07/2020 0902   CHOLHDL 6.8 (H) 03/07/2020 0902   LDLCALC 170 (H) 03/07/2020 0902    Clinical ASCVD: No  The 10-year ASCVD risk score (Arnett DK, et al., 2019) is:  10.1%   Values used to calculate the score:     Age: 42 years     Sex: Male     Is Non-Hispanic African American: No     Diabetic: Yes     Tobacco smoker: No     Systolic Blood Pressure: [id number] mmHg     Is BP treated: Yes     HDL Cholesterol: 33 mg/dL     Total Cholesterol: 226 mg/dL     BP Readings from Last 3 Encounters:  03/26/21 102/65  03/25/21 (!) 102/58  03/03/21 114/62    Care Plan  No Known Allergies  Medications Reviewed Today     Reviewed by De Hollingshead, RPH-CPP (Pharmacist) on 03/27/21 at 1141  Med List Status: <None>   Medication Order Taking? Sig Documenting Provider Last Dose Status Informant  0.9 %  sodium chloride infusion [id number]   Nelida Meuse III, MD  Active   Alpha-Lipoic Acid 200 MG CAPS [id number] No Take 1 capsule by mouth daily.  Patient not taking: No sig reported   [provider] Not Taking Active   amLODipine (NORVASC) 5 MG tablet [id number] Yes Take 1 tablet (5 mg total) by mouth daily. Burnard Hawthorne, FNP Taking Active   aspirin EC 81 MG tablet [id number] Yes Take 1 tablet (81 mg total) by mouth daily. Swallow whole. Earlie Server, MD Taking Active   B Complex Vitamins (VITAMIN B COMPLEX PO) [id number] Yes Take 1 tablet by mouth daily. [provider] Taking Active Self  CONTOUR NEXT TEST test strip [id number]  USE 1 STRIP  TO TEST BLOOD  SUGAR TWICE DAILY  Patient not taking: Reported on 03/26/2021   Burnard Hawthorne, FNP  Active   FLUoxetine (PROZAC) 10 MG tablet [id number] No Take 1 tablet (10 mg total) by mouth daily.  Patient not taking: No sig reported   Burnard Hawthorne, FNP Not Taking Active            Med Note Darnelle Maffucci, Maralyn Sago Mar 27, 2021 11:37 AM)    gabapentin (NEURONTIN) 100 MG capsule [id number] Yes Take 2 capsules (200 mg total) by mouth 3 (three) times daily. Earlie Server, MD Taking Active            Med Note Darnelle Maffucci, Maralyn Sago Mar 27, 2021 11:39 AM) PRn  hydrochlorothiazide (HYDRODIURIL)  25 MG tablet [id number] Yes Take 25 mg by mouth daily. [provider] Taking Active   insulin glargine (LANTUS SOLOSTAR) 100 UNIT/ML Solostar Pen [id number] Yes Inject 64 Units into the skin daily.  Patient taking differently: Inject 15 Units into the skin daily.   Burnard Hawthorne, FNP Taking Active   insulin lispro (HUMALOG KWIKPEN) 100 UNIT/ML KwikPen [id number] Yes Inject 18-20 units into the skin after breakfast & lunch. Inject 22-24 units into the skin after dinner. Hold if glucose premeal <100. Kristen Loader, FNP Taking Active   Insulin Pen Needle (PEN NEEDLES) 32G X 6 MM MISC [id number]  Used to give insulin injections 4 times daily. Burnard Hawthorne, FNP  Active   lidocaine-prilocaine (EMLA) cream [id number] No Apply to affected area once  Patient not taking: No sig reported   Earlie Server, MD Not Taking Active   loperamide (IMODIUM A-D) 2 MG tablet [id number] Yes Take 1 tablet (2 mg total) by mouth See admin instructions. Take 2 tabs at diarrhea onset , then 1 tab after every loose bowel movement. Maximum 8 tablets within 24 hour period of time Earlie Server, MD Taking Active   LORazepam (ATIVAN) 0.5 MG tablet [id number] Yes Take 1 tablet (0.5 mg total) by mouth 2 (two) times daily as needed for anxiety. Burnard Hawthorne, FNP Taking Active   [name] [id number] Yes Take 5 mg by mouth at bedtime as needed. [provider] Taking Active   metFORMIN (GLUCOPHAGE XR) 500 MG 24 hr tablet [id number] Yes Take 4 tablets (2,000 mg total) by mouth every evening. Burnard Hawthorne, FNP Taking Active   metoprolol succinate (TOPROL-XL) 100 MG 24 hr tablet [id number] Yes TAKE 1 TABLET BY MOUTH  DAILY WITH OR IMMEDIATELY  FOLLOWING A MEAL Leone Haven, MD Taking Active   Microlet [place] [id number] Yes Used to check blood sugars three times a day. Burnard Hawthorne, FNP Taking Active   mometasone (ELOCON) 0.1 % cream [id number] Yes Apply 1 application topically daily. Appear pea sized  ( or less) amount to external ear, and slightly in canal. Burnard Hawthorne, FNP Taking Active   Omega-3 Fatty Acids (FISH OIL PO) [id number] Yes Take 800 mg by mouth daily. [provider] Taking Active Self  omeprazole (PRILOSEC) 20 MG capsule [id number] Yes Take 1 capsule (20 mg total) by mouth in the morning and at bedtime. Earlie Server, MD Taking Active   ondansetron [facility]) 8 MG tablet [id number]  Take 1 tablet (8 mg total) by mouth 2 (two) times daily as needed for refractory nausea / vomiting. Start on day 3 after chemotherapy.  Patient not taking: Reported on 03/26/2021   Earlie Server, MD  Active  oxyCODONE (ROXICODONE) 5 MG immediate release tablet [id number] Yes Take 1 tablet (5 mg total) by mouth every 6 (six) hours as needed for severe pain. Earlie Server, MD Taking Active   polyethylene glycol powder [facility]) 17 GM/SCOOP powder [id number] Yes Take by mouth daily. 1/2 capful QD [provider] Taking Active   senna (SENOKOT) 8.6 MG TABS tablet [id number] Yes Take 2 tablets (17.2 mg total) by mouth daily. Earlie Server, MD Taking Active   telmisartan (MICARDIS) 40 MG tablet [id number] Yes Take 1 tablet (40 mg total) by mouth daily. PLEASE CALL OFFICE TO SCHEDULE FOLLOW UP APPOINTMENT FOR REFILLS. Burnard Hawthorne, FNP Taking Active   zinc gluconate 50 MG tablet [id number] Yes Take 50 mg by mouth daily. [provider] Taking Active             Patient Active Problem List   Diagnosis Date Noted   Anxiety and depression    Pressure sore 03/03/2021   Constipation 03/03/2021   GERD (gastroesophageal reflux disease) 12/16/2020   Hypertriglyceridemia 12/16/2020   Hyperosmolar coma due to type 2 diabetes mellitus ([place]) 12/16/2020   Tachycardia 09/11/2020   Nausea and vomiting 09/11/2020   Genetic testing 07/23/2020   Family history of esophageal cancer    Family history of prostate cancer    Insomnia 06/14/2020   Neuropathy due to chemotherapeutic drug ([place])  06/11/2020   Uncontrolled diabetes mellitus ([place]) 06/11/2020   Encounter for antineoplastic chemotherapy 06/03/2020   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   Rectal cancer metastasized to liver ([place]) 05/27/2020   Goals of care, counseling/discussion 05/19/2020   Dysuria 05/07/2020   Acute prostatitis 05/07/2020   Liver disease 05/02/2020   Bloating 03/13/2020   Hepatic steatosis 08/14/2019   Unstable angina ([place]) 08/11/2019   H/O supraventricular tachycardia 08/11/2019   Morbid obesity ([place]) 08/11/2019   COVID-19 07/18/2019   Type 2 diabetes mellitus without complication, with long-term current use of insulin ([place]) 02/27/2019   SVT (supraventricular tachycardia) ([place]) 02/27/2019   Hypertension 02/27/2019   Psoriatic arthritis ([place]) 02/27/2019    Conditions to be addressed/monitored: HTN and DMII  Care Plan : Medication Management  Updates made by De Hollingshead, RPH-CPP since 03/27/2021 12:00 AM     Problem: Diabetes, Cancer, HTN w/ SVT      Long-Range Goal: Disease Progression Prevention   This Visit's Progress: On track  Recent Progress: On track  Priority: High  Note:   Current Barriers:  Unable to independently afford treatment regimen Unable to achieve control of diabetes  Complex patient with recent cancer diagnosis  Pharmacist Clinical Goal(s):  Over the next 90 days, patient will verbalize ability to afford treatment regimen. Over the next 90 days, patient will adhere to prescribed medication regimen  Interventions: 1:1 collaboration with Burnard Hawthorne, FNP regarding development and update of comprehensive plan of care as evidenced by provider attestation and co-signature Inter-disciplinary care team collaboration (see longitudinal plan of care) Comprehensive medication review performed; medication list updated in electronic medical record  Diabetes: Controlled; current treatment: metformin XR 1000 mg BID, Lantus 15 units daily,  Humalog 2-4 units TID w/meals  Denies hypoglycemic episodes in the past 4-6 weeks Current glucose readings: using Libre 2 CGM, attempted to place a sensor yesterday but it would not pair. Advised to place another sensor, contact manufacturer for a replacement.  Counseled on Rule of 15 with low blood sugars.  Patient requested metformin be resent for a 90 day supply. Recommend updating Lantus and Humalog orders  in the chart. Will collaborate w/ PCP. Discussed with PCP earlier this week regarding potential risk of lactic acidosis with liver impairment and continued high dose of metformin. Patient elected to continue metformin therapy at this time due to possible evidence of benefit with malignancy, per his report. Advised to discuss with hem/onc team.  Hypertension w/ SVT Appropriately controlled given comorbidities; current treatment: amlodipine 5 mg, HCTZ 25 mg daily, metoprolol succinate 100 mg daily, telmisartan 40 mg daily   Monitoring BP to evaluate need for dose reduction.  Previously encouraged to continue current therapy at this time and collaboration with cardiology  Malignancy: Followed by Dr. Tasia Catchings, Dr. Hyman Hopes at [facility], Symptomatic management includes:  Anxiety: lorazepam 0.5 mg PRN replacing alprazolam due to lower hepatic clearance Peripheral neuropathy: gabapentin 100 mg TID PRN Nausea; ondansetron 8 mg ODT  Constipation: senna 8.6 mg PRN moderate constipation, Miralax PRN Recommended to continue current collaboration with oncology at this time. Hospice order placed.   Patient Goals/Self-Care Activities Over the next 90 days, patient will:  - take medications as prescribed check glucose at least three times daily using CGM, document, and provide at future appointments Collaborate with interdisciplinary team   Follow Up Plan: Telephone follow up appointment with care management team member scheduled for: ~4 weeks     Medication Assistance:  None required.  Patient affirms  current coverage meets needs.  Follow Up:  Patient agrees to Care Plan and Follow-up.  Plan: Telephone follow up appointment with care management team member scheduled for:  ~ 4 weeks  Catie Darnelle Maffucci, PharmD, [place], [place] Clinical Pharmacist [employer] [facility] [employer] [phone]

## 2021-03-31 ENCOUNTER — Telehealth: Payer: Self-pay | Admitting: Hospice and Palliative Medicine

## 2021-03-31 NOTE — Telephone Encounter (Signed)
I spoke with patient's hospice nurse -Ellison Hughs regarding patient's LFTs/bilirubin.  No indication to continue monitoring or rechecking labs in her hospice.  Unfortunately, prognosis is quite poor.  She plans to see patient today and will let us know if he has any needs/questions.

## 2021-04-01 ENCOUNTER — Telehealth: Payer: Self-pay | Admitting: Family

## 2021-04-01 NOTE — Telephone Encounter (Signed)
Call pt How is he doing?  What do fasting blood sugar and postprandial blood sugars look like? Is he wearing cgm?   Lab Results  Component Value Date   HGBA1C 5.4 03/26/2021   A1c is very low.  I would recommend de-escalating regimen even further.  At last visit he was on the following  metformin XR 1000 mg BID, Lantus 15 units daily, Humalog 2-4 units TID w/meals   Would he consider discontinuing humalog?   Please ensure medication list is accurate and reflects above

## 2021-04-02 ENCOUNTER — Telehealth: Payer: Self-pay | Admitting: Family

## 2021-04-02 NOTE — Telephone Encounter (Signed)
Rejection Reason - Patient did not respond - Patient cancelled appointment with Dr. Gabriel Carina on 03/04/2021 and has not responded to attempts to reschedule this appointment. Please verify that patient does desire an appointment with endocrinology. If so, please place a new referral for this patient." Dominic [name] said on Apr 02, 2021 10:21 AM  Msg from [facility] endo

## 2021-04-04 ENCOUNTER — Other Ambulatory Visit: Payer: Self-pay

## 2021-04-04 NOTE — Telephone Encounter (Signed)
I spoke with patient & stated that fasting glucoses  have been running in the 120's. After meals 150'0-160's. He hasn't had any lows, but most definitely wants to avoid, so he will d/c the humalog. I will from chart as well. He is not wearing CGM right now due to an update on phone & stated that he needs to look to see if app needs updating bc he doesn't want to waste them.  He said that this morning the oxycodone prescribed aggravated his SVT & sent HR to 180. He is in touch with oncology team for recommendations on changing med. He was concerned though bc he stated that he saw where there was a drug interaction with the oxy as well as Ativan. He wanted to make sure he could take a Ativan if needed when he has taken the oxy. He will ask oncology as well since oxy will  have to be changed after his reaction this morning.

## 2021-04-08 ENCOUNTER — Other Ambulatory Visit: Payer: Self-pay

## 2021-04-08 NOTE — Telephone Encounter (Signed)
I spoke with patient & he has stopped the humalog. He said that he still see's 110-120's in the morning, but after meals he has seen 220-240. Do you still want him to half the Lantus to 7 units? He also said that he has been off metformin, but that was because he ran out & was just delivered. I advised stay off due to metabolic acidosis risk. He was sent in another pain medication other the the oxycodone, but advised him on your message as well.

## 2021-04-08 NOTE — Telephone Encounter (Signed)
Call pt  Now that he is off humalog, how are blood sugars?  I would advise to stop metformin to avoid metabolic acidosis as we discussed in office.  He can also consider reducing lantus in HALF to 7 units.   I am worried about hypoglycemic episodes.   I am not concerned with drug interaction with oxycodone and ativan in that he needs both of them at this time under hospice care. The concern is excessive sedation and I would advise to use only when needed and not at the same time and to also remain very vigilant for confusion, excessive sleepiness.

## 2021-04-09 NOTE — Telephone Encounter (Signed)
I called and patient will let us know if any fasting approach 100 & he will reduce the Lantus to the 7 units. He will remain off the metformin as well as Humalog.

## 2021-04-09 NOTE — Telephone Encounter (Signed)
Call pt Agree based on post prandial reasonable to stay on lantus 15 units.  Please remind him to be extra vigilant as it relates to hypoglycemic episodes and if fasting blood sugars start to routinely approach less than 100 he needs to reduce by half to 7 units.  He can remain off metformin and humalog.

## 2021-04-25 ENCOUNTER — Telehealth: Payer: Managed Care, Other (non HMO) | Admitting: Family

## 2021-05-06 ENCOUNTER — Telehealth: Payer: Managed Care, Other (non HMO)

## 2021-05-08 ENCOUNTER — Other Ambulatory Visit: Payer: Self-pay | Admitting: Family

## 2021-05-08 DIAGNOSIS — I10 Essential (primary) hypertension: Secondary | ICD-10-CM

## 2021-05-13 DEATH — deceased

## 2021-05-27 ENCOUNTER — Other Ambulatory Visit: Payer: Self-pay | Admitting: Family

## 2021-05-27 DIAGNOSIS — E119 Type 2 diabetes mellitus without complications: Secondary | ICD-10-CM

## 2021-05-27 DIAGNOSIS — Z794 Long term (current) use of insulin: Secondary | ICD-10-CM

## 2021-11-10 IMAGING — CT CT CHEST-ABD-PELV W/ CM
1 of 3 series · 10 of 30 positions shown, 16 images · IV contrast (omnipaque)
Comparison: 05/15/2020 chest CT. 05/02/2020 abdominal MRI. No prior
pelvic imaging.

CLINICAL DATA: Rectal cancer with liver metastasis. Rising CEA.
Severe constipation after chemotherapy [REDACTED].

EXAM:
CT CHEST, ABDOMEN, AND PELVIS WITH CONTRAST
TECHNIQUE: Multidetector CT imaging of the chest, abdomen and pelvis was
performed following the standard protocol during bolus
administration of intravenous contrast.
CONTRAST:  100mL OMNIPAQUE IOHEXOL 300 MG/ML  SOLN

[Series 2: cap with · axial · 0.85mm/px · z∈[-1081,-556]mm · 10 of 133 slices shown, 16 images]
[im 14/133  mediastinal]
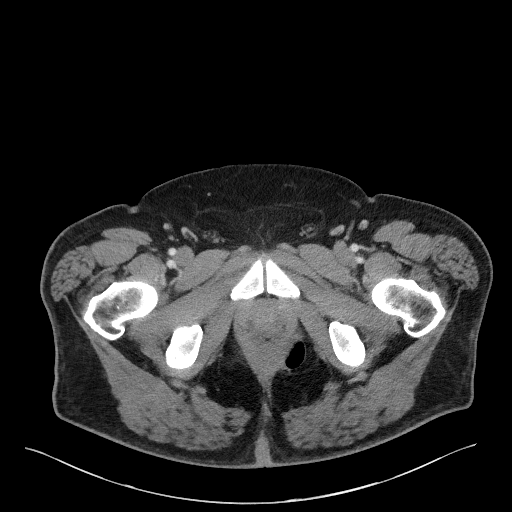
[im 14/133  bone]
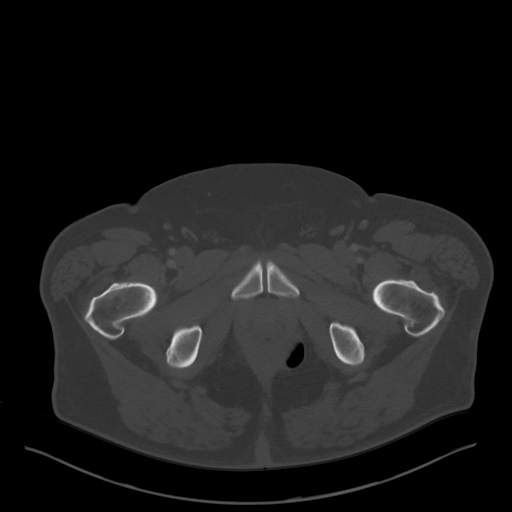
[im 27/133  mediastinal]
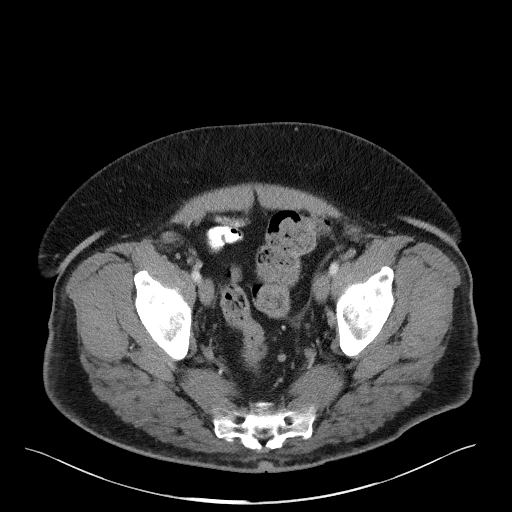
[im 40/133  mediastinal]
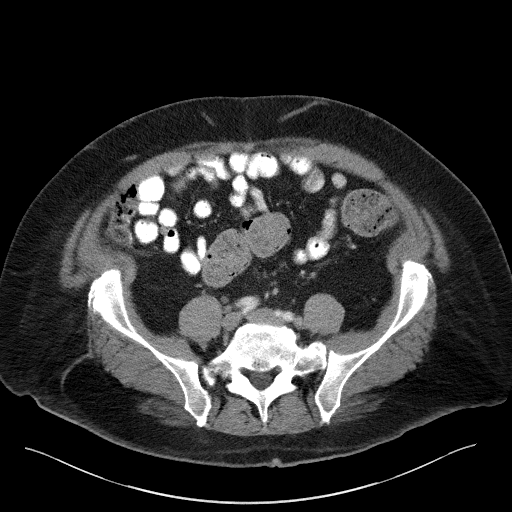
[im 53/133  mediastinal]
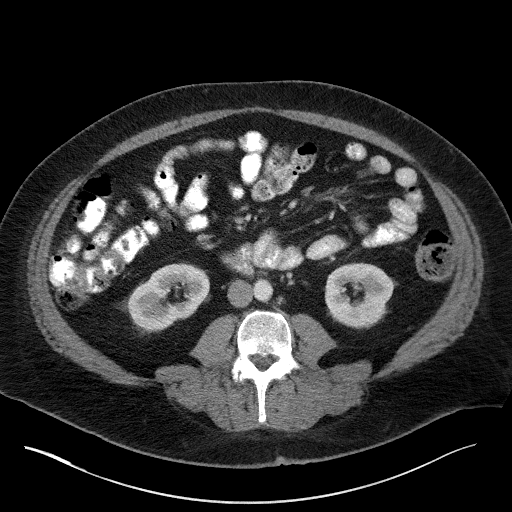
[im 65/133  mediastinal]
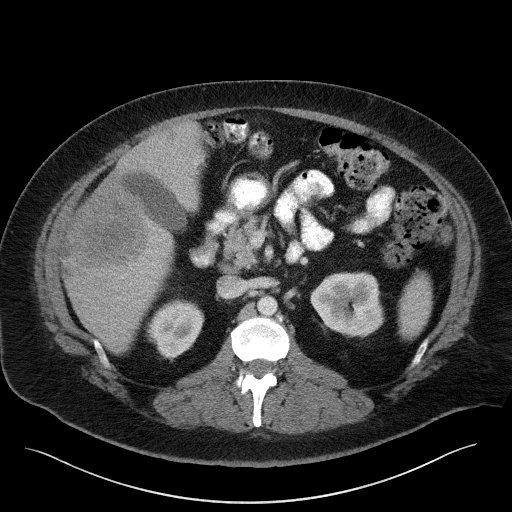
[im 67/133  mediastinal]
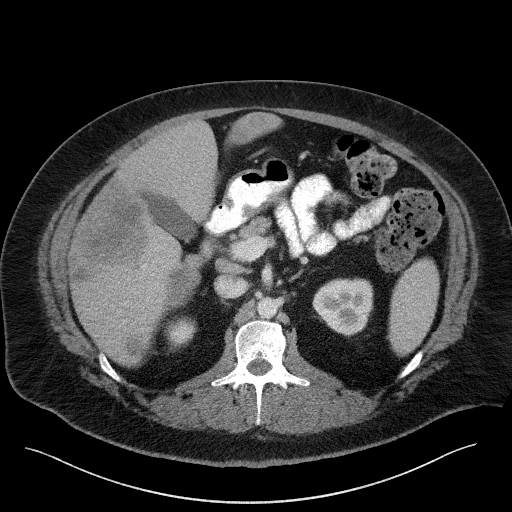
[im 80/133  mediastinal]
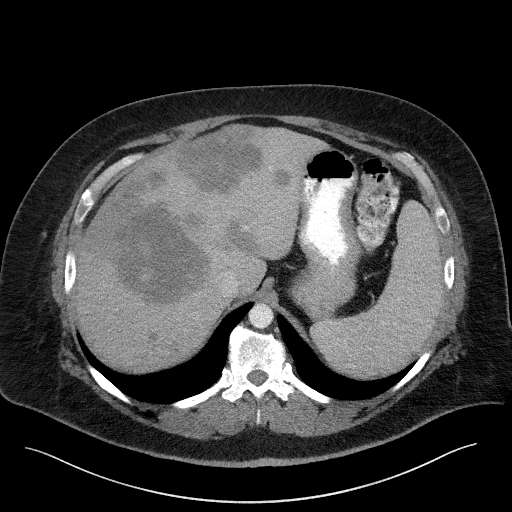
[im 80/133  lung]
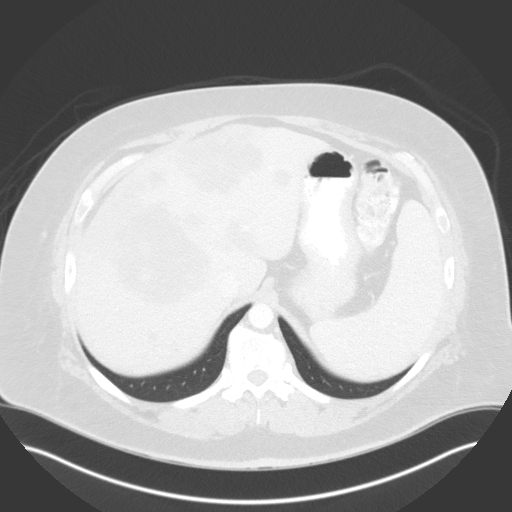
[im 93/133  mediastinal]
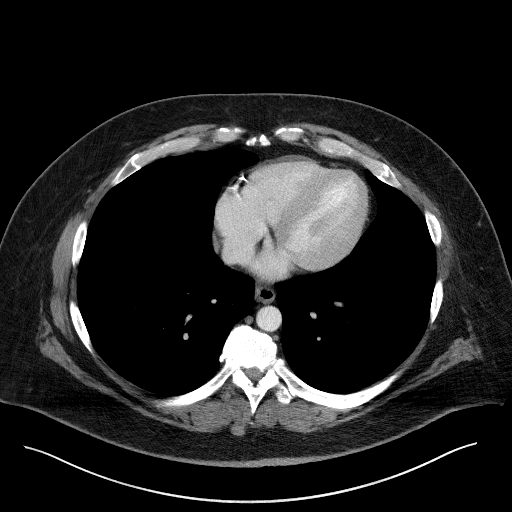
[im 93/133  lung]
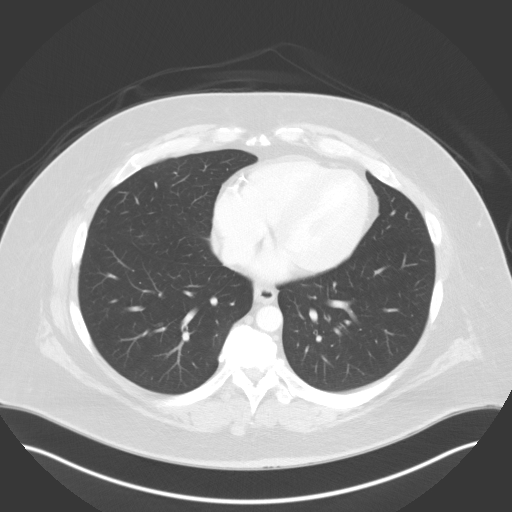
[im 106/133  mediastinal]
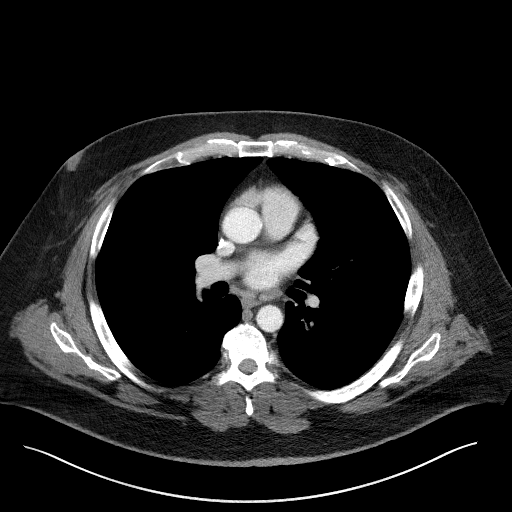
[im 106/133  lung]
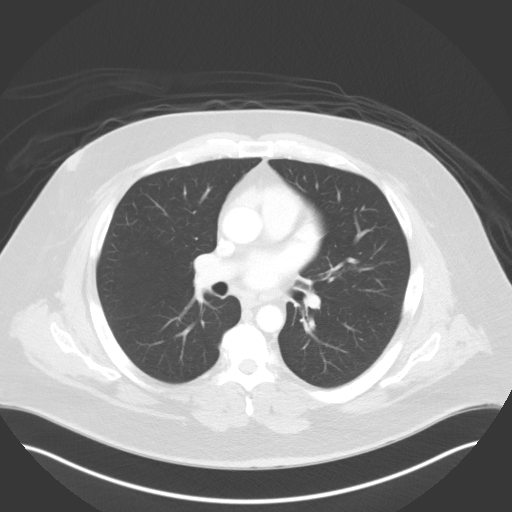
[im 106/133  bone]
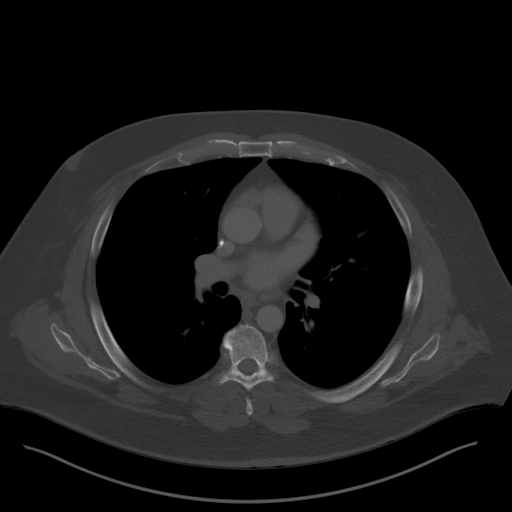
[im 119/133  mediastinal]
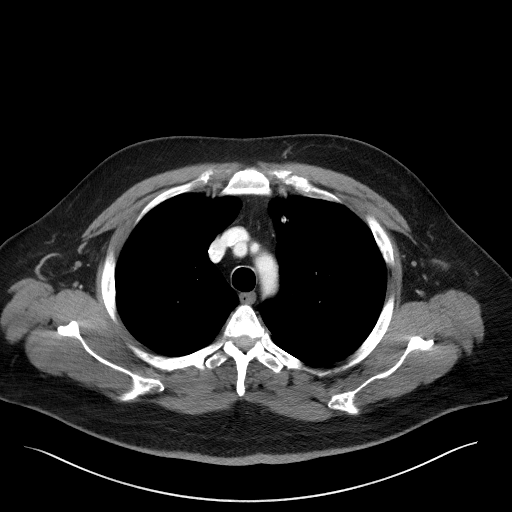
[im 119/133  lung]
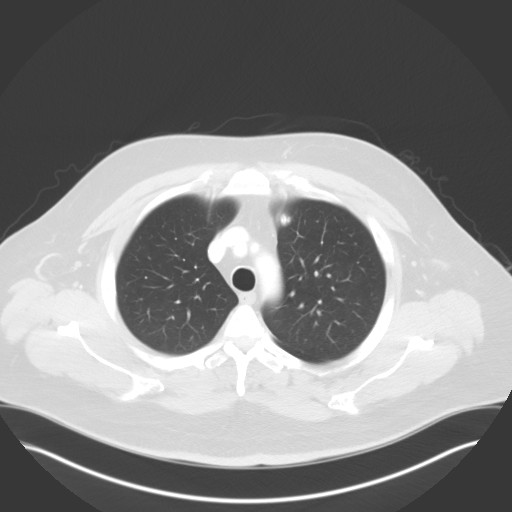

[10 of 30 positions shown; findings below may reference images not displayed]

FINDINGS: CT CHEST FINDINGS

Cardiovascular: Right Port-A-Cath tip at superior caval/atrial
junction. Aortic atherosclerosis. Normal heart size, without
pericardial effusion. Multivessel coronary artery atherosclerosis.
No central pulmonary embolism, on this non-dedicated study.

Mediastinum/Nodes: No supraclavicular adenopathy. No middle
mediastinal or hilar adenopathy.

Isolated anterior mediastinal node is upper normal sized at 5 mm
today versus similar on the prior exam (when remeasured).

Lungs/Pleura: No pleural fluid. Right apical pulmonary nodule
measures 5 mm on [DATE] versus 6 mm on the prior exam (when
remeasured).

Musculoskeletal: Mild, left greater than right gynecomastia. No
acute osseous abnormality.

CT ABDOMEN PELVIS FINDINGS

Hepatobiliary: Bilateral hepatic metastasis again identified. A mass
or conglomerate of masses within the right hepatic dome measures on
the order of 9.0 by 8.8 cm on 48/2. 10.1 x 9.0 cm on the prior MRI
(when remeasured).

Segment [DATE]A lesion measures 4.8 x 4.5 cm on 51/2 versus 4.7 x
cm on the prior exam (when remeasured).

Dominant right pericholecystic hepatic mass measures 6.9 x 6.9 cm on
69/2. Compare 6.8 x 7.7 cm at the same level on the prior MRI (when
remeasured).

Normal gallbladder, without biliary ductal dilatation.

Pancreas: Normal, without mass or ductal dilatation.

Spleen: Hypoattenuating wedge-shaped splenic lesion of 2.0 cm on
58/2 is similar to the prior MRI, favoring remote infarct.

Adrenals/Urinary Tract: Normal adrenal glands. Too small to
characterize lesions in both kidneys. No hydronephrosis.

Stomach/Bowel: Normal stomach, without wall thickening.

Colonic stool burden suggests constipation. Wall thickening and
underdistention within the upper rectum including on [DATE]
represent the rectal primary. No obstruction at the site. Normal
terminal ileum and appendix. Normal small bowel.

Vascular/Lymphatic: Aortic atherosclerosis. No abdominal or pelvic
sidewall adenopathy.

A high left perirectal 5 mm node on 107/2 is suspicious based on
location and clinical history. There is also a borderline sized 5 mm
node within the inferior sigmoid mesocolon on 103/2.

Reproductive: Normal prostate.

Other: No significant free fluid. No evidence of omental or
peritoneal disease.

Musculoskeletal: Scattered tiny sclerotic lesions throughout the
pelvis are likely bone islands.
IMPRESSION: 1. Extensive hepatic metastasis, similar to minimally improved
compared to 05/02/2020 MRI.
2. Possible constipation. High rectal wall thickening, likely the
site of primary. No obstruction or other acute complication.
3. Suspicious borderline sized perirectal nodes, as above.
4. Right apical isolated pulmonary nodule, similar to slightly
decreased. This remains indeterminate.
5. Age advanced coronary artery atherosclerosis. Recommend
assessment of coronary risk factors and consideration of medical
therapy.
6.  Aortic Atherosclerosis ([id number]).

These results will be called to the ordering clinician or
representative by the Radiologist Assistant, and communication
documented in the PACS or [REDACTED].

## 2021-12-14 IMAGING — CT NM PET SKULL BASE TO THIGH
1 of 9 series · 3 of 25 positions shown · non-contrast
Comparison: CT 07/18/2020, MRI 05/02/2020

CLINICAL DATA: Neuroendocrine tumor of the pancreas. Colorectal
carcinoma liver metastasis. Rectal carcinoma.

EXAM:
NUCLEAR MEDICINE PET SKULL BASE TO THIGH
TECHNIQUE: 5.6 mCi gallium 68 DOTATATE was injected intravenously. Full-ring
PET imaging was performed from the skull base to thigh after the
radiotracer. CT data was obtained and used for attenuation
correction and anatomic localization.

[Series 3: ct wb 5.0 b30f · axial · 5.0mm · 0.98mm/px · z∈[-244,+740]mm · 3 of 329 slices shown]
[im 1/329  brain]
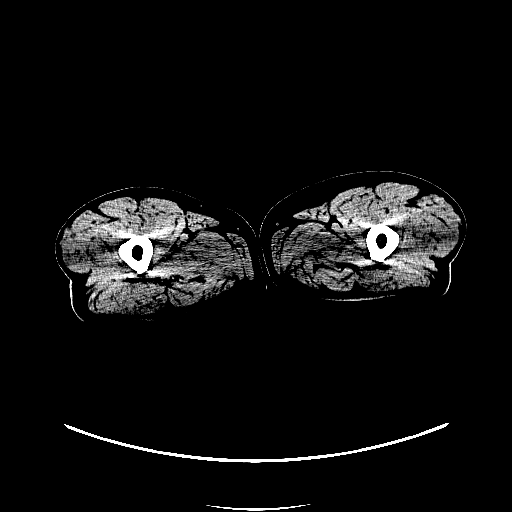
[im 165/329  brain]
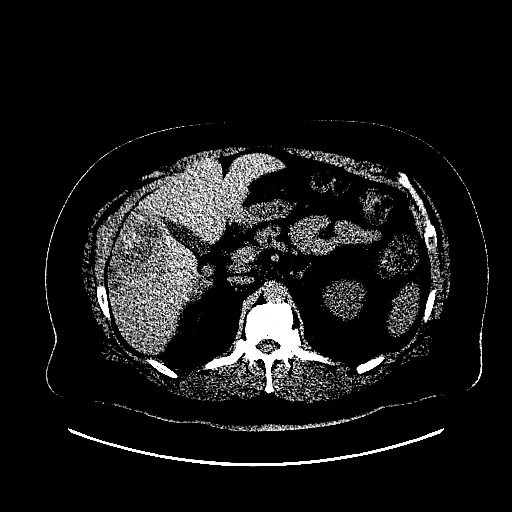
[im 329/329  brain]
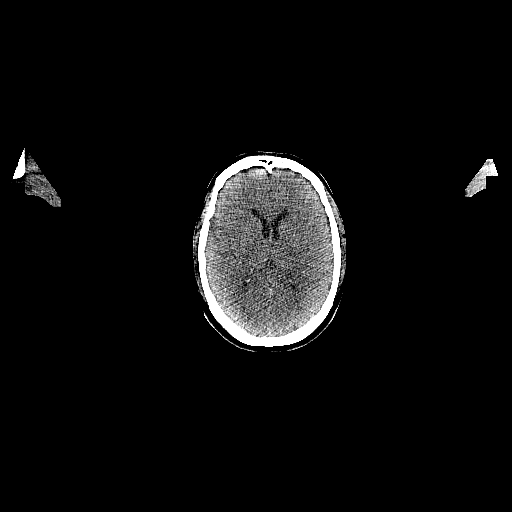

[3 of 25 positions shown; findings below may reference images not displayed]

FINDINGS: NECK

No radiotracer activity in neck lymph nodes.

Incidental CT findings: None

CHEST

No radiotracer accumulation within mediastinal or hilar lymph nodes.
No suspicious pulmonary nodules on the CT scan.

Incidental CT finding:None

ABDOMEN/PELVIS

Lesion in the mid pancreas measuring approximately 3 cm (image 153)
has intense radiotracer activity SUV max equal 15.5. Lesion
difficult to define on noncontrast CT.

There are large low-density lesions in the liver which do not
accumulate radiotracer. These lesions have radiotracer activity less
than background physiologic liver activity.

There are no radiotracer avid retroperitoneal lymph nodes. No focal
radiotracer avid peritoneal lesions. No radiotracer avid lesions
lesions the bowel.

Physiologic activity noted in the liver, spleen, adrenal glands and
kidneys.

Incidental CT findings:No evidence of bowel obstruction.

SKELETON

No focal activity to suggest skeletal metastasis.

Incidental CT findings:None
IMPRESSION: 1. Intense radiotracer activity associated with mid pancreatic body
lesion. Findings consistent well differentiated neuroendocrine tumor
of the pancreas.
2. No evidence of metastatic well differentiated neuroendocrine
tumor.
3. Multifocal liver metastasis without radiotracer activity
consistent with colorectal adenocarcinoma metastasis.

## 2022-01-17 IMAGING — CT CT CHEST-ABD-PELV W/ CM
2 of 3 series · 12 of 30 positions shown, 18 images · IV contrast (omnipaque)
Comparison: 08/21/2020 Dotatate PET. 07/18/2020 chest abdomen and
pelvic CT.

CLINICAL DATA: Follow-up of metastatic rectal cancer and pancreatic
neuroendocrine carcinoma.

EXAM:
CT CHEST, ABDOMEN, AND PELVIS WITH CONTRAST
TECHNIQUE: Multidetector CT imaging of the chest, abdomen and pelvis was
performed following the standard protocol during bolus
administration of intravenous contrast.
CONTRAST:  100mL OMNIPAQUE IOHEXOL 300 MG/ML  SOLN

[Series 2: cap with · axial · 0.87mm/px · z∈[-1110,-565]mm · 10 of 137 slices shown, 16 images]
[im 14/137  mediastinal]
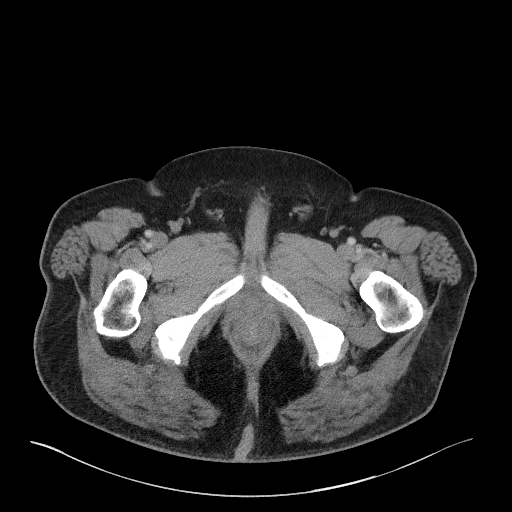
[im 14/137  bone]
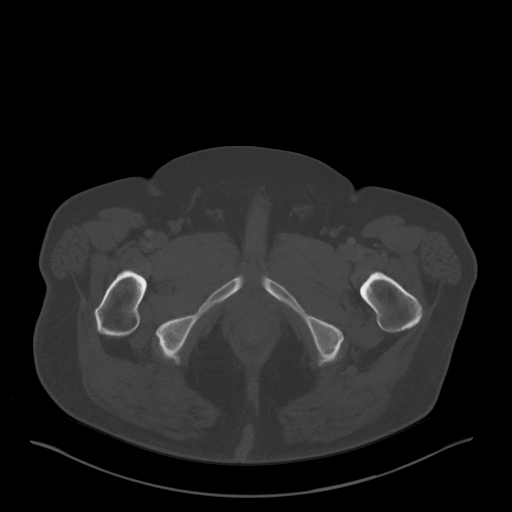
[im 28/137  mediastinal]
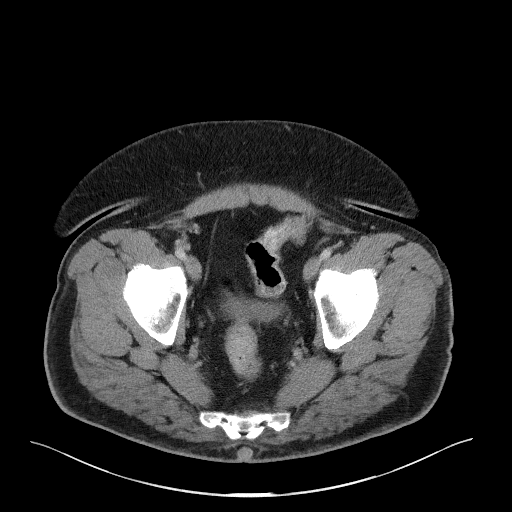
[im 41/137  mediastinal]
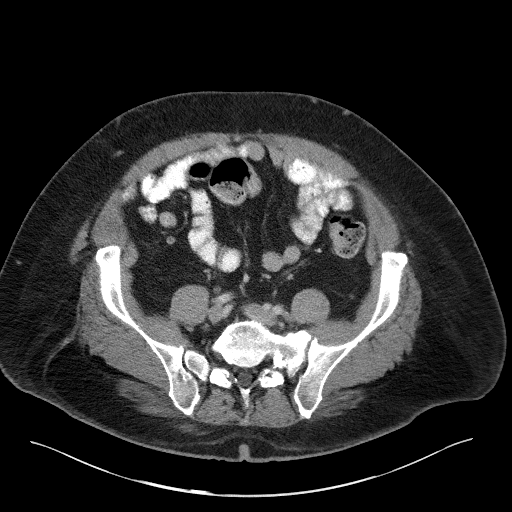
[im 55/137  mediastinal]
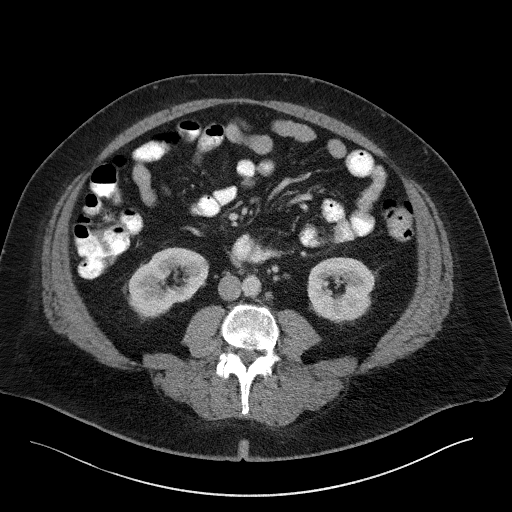
[im 67/137  mediastinal]
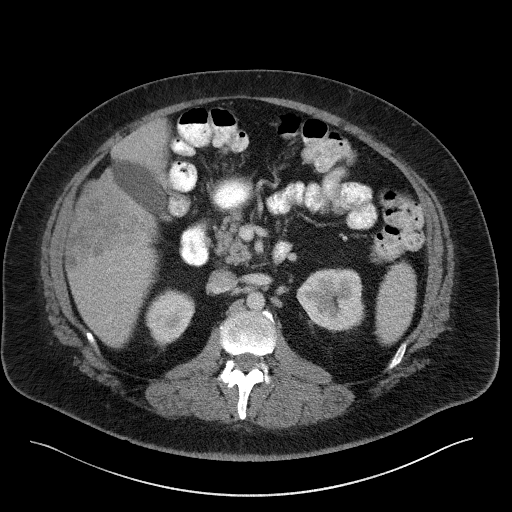
[im 69/137  mediastinal]
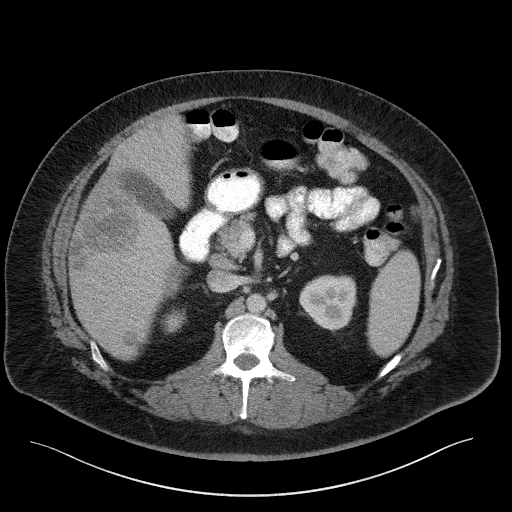
[im 82/137  mediastinal]
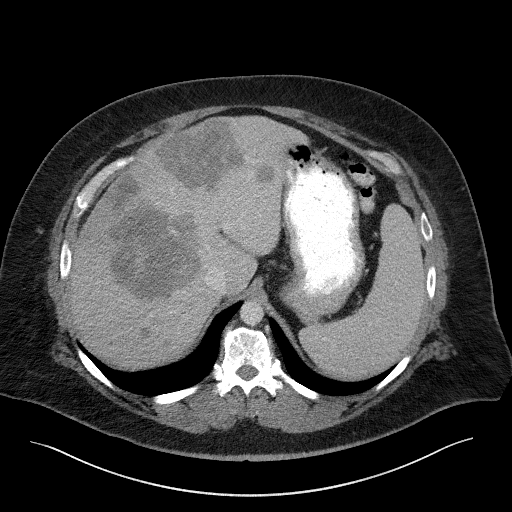
[im 82/137  lung]
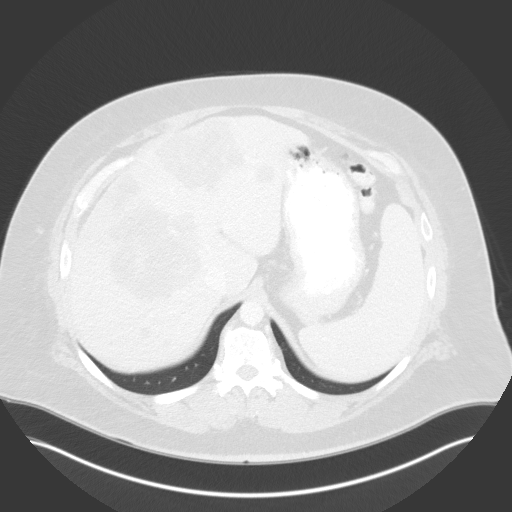
[im 96/137  mediastinal]
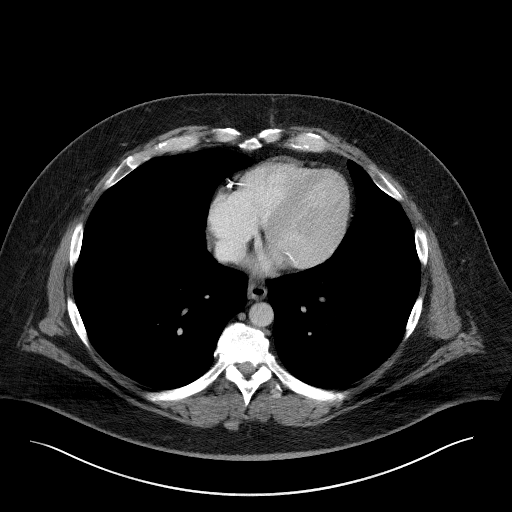
[im 96/137  lung]
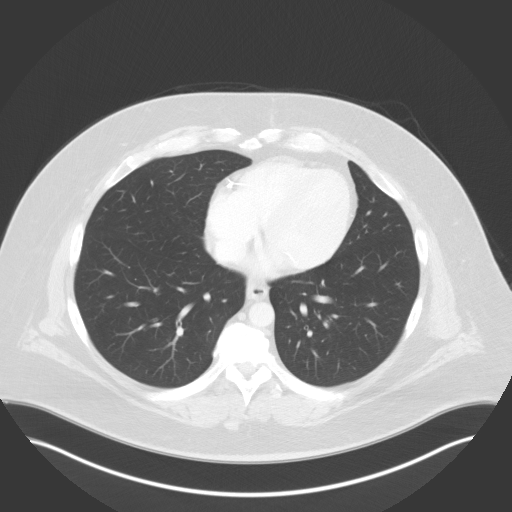
[im 109/137  mediastinal]
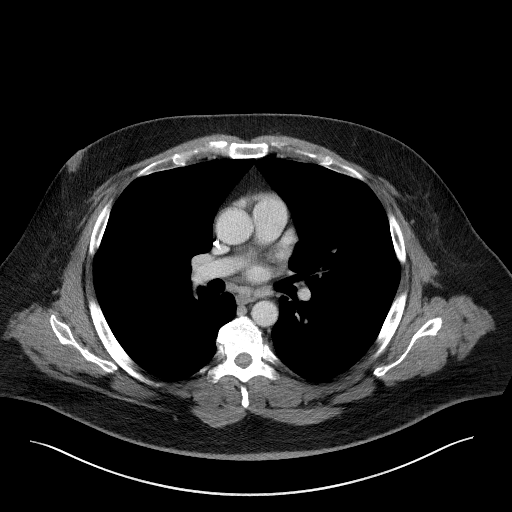
[im 109/137  lung]
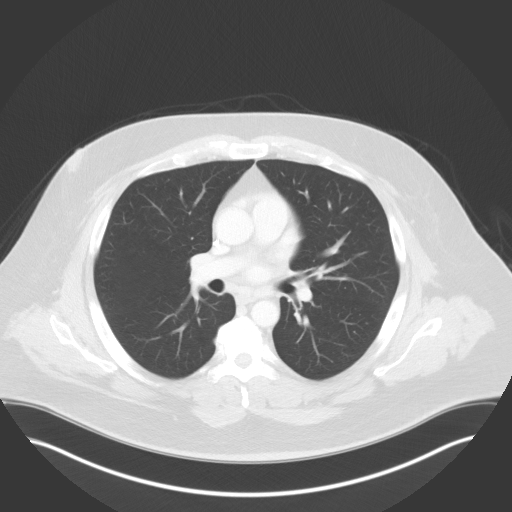
[im 109/137  bone]
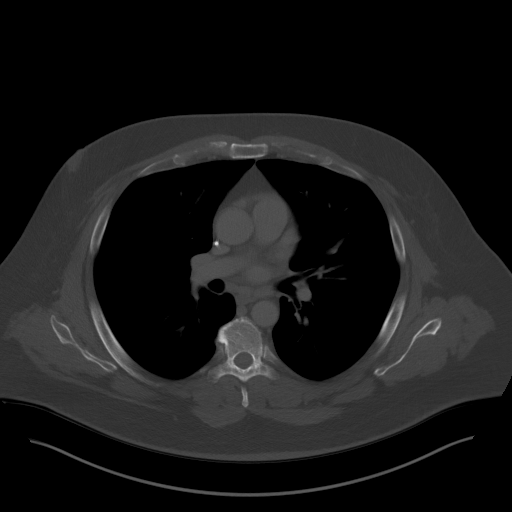
[im 123/137  mediastinal]
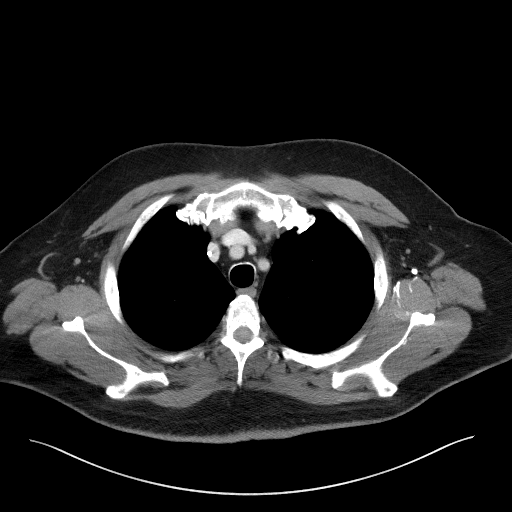
[im 123/137  lung]
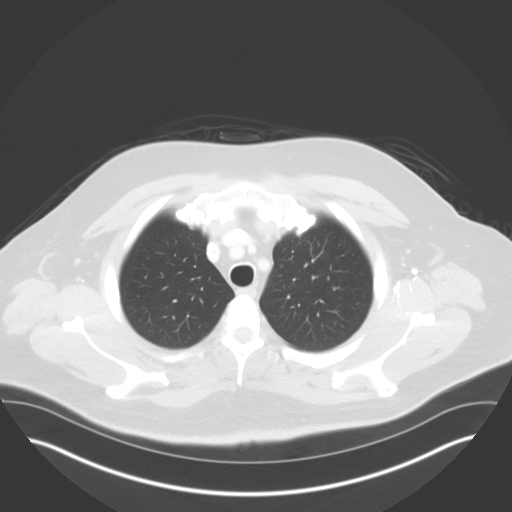

[Series 4: lung · axial · 0.87mm/px · z∈[-799,-743]mm · 2 of 166 slices shown]
[im 14/166  bone]
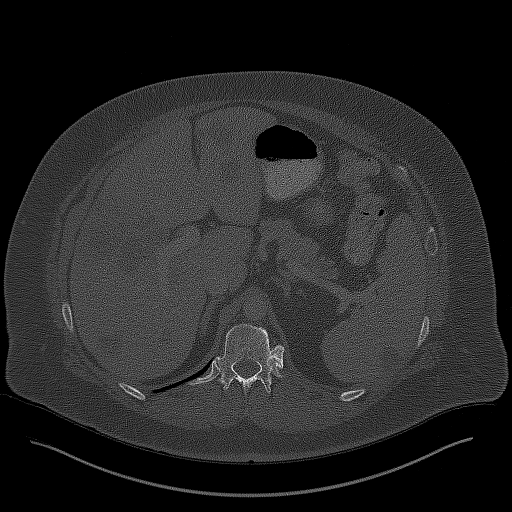
[im 42/166  bone]
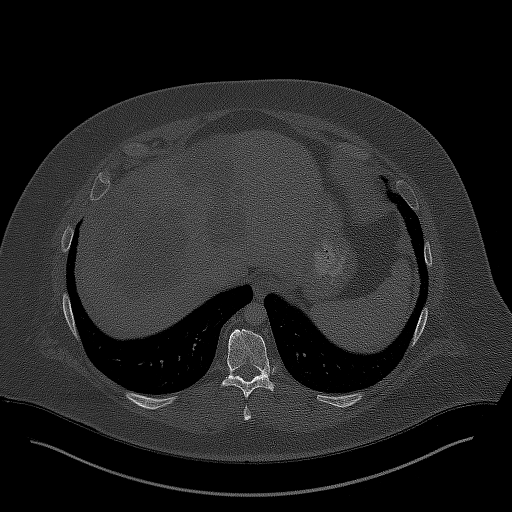

[12 of 30 positions shown; findings below may reference images not displayed]

FINDINGS: CT CHEST FINDINGS

Cardiovascular: Right Port-A-Cath tip superior caval/atrial
junction. Aortic atherosclerosis. Normal heart size, without
pericardial effusion. Multivessel coronary artery atherosclerosis.
No central pulmonary embolism, on this non-dedicated study.

Mediastinum/Nodes: No supraclavicular adenopathy. No mediastinal or
hilar adenopathy. Prevascular 5 mm node is unchanged, favored to be
reactive.

Lungs/Pleura: No pleural fluid. 5 mm vague, possibly sub solid right
apical pulmonary nodule is similar on [DATE].

Posterior lingular subpleural 2 mm nodule may represent a calcified
granuloma and is unchanged.

Musculoskeletal: Mild bilateral gynecomastia. No acute osseous
abnormality.

CT ABDOMEN PELVIS FINDINGS

Hepatobiliary: Multifocal, bilateral hepatic metastasis.

Index lesion straddling segments 2 and 4A measures 4.2 x 3.8 cm on
52/2. Compare 4.8 x 4.5 cm on the prior exam.

High right hepatic lobe subcapsular mass measures 9.5 x 8.3 cm on
50/2 versus 9.0 x 7.7 cm on the prior exam (when remeasured).

Anterior segment 4A mass measures 6.1 x 5.2 cm on 56/2 versus 6.3 x
4.9 cm on the prior exam (when remeasured).

Index pericholecystic right liver lobe mass measures 5.6 x 4.8 cm on
68/2. Compare 6.9 x 6.9 cm on the prior exam.

Normal gallbladder, without biliary ductal dilatation.

Pancreas: Normal, without mass or ductal dilatation.

Spleen: Subcapsular hypoattenuating splenic lesion of 1.7 cm is
nonspecific and decreased from 2.0 cm on the prior exam. Possibly
related to remote infarct.

Adrenals/Urinary Tract: Normal adrenal glands. Bilateral too small
to characterize renal lesions. No hydronephrosis. Normal urinary
bladder.

Stomach/Bowel: Normal stomach, without wall thickening. The upper
rectal primary is slightly less distinct today including on 108/2.
No obstruction. Normal terminal ileum and appendix. Normal small
bowel.

Vascular/Lymphatic: Aortic atherosclerosis. 9 mm portal caval node
is similar to on the prior exam and not pathologic by size criteria.
No pelvic sidewall adenopathy. Tiny perirectal nodes are not
significantly changed.

Reproductive: Normal prostate.

Other: No significant free fluid. No evidence of omental or
peritoneal disease.

Musculoskeletal: No acute osseous abnormality.
IMPRESSION: 1.  No acute process or evidence of metastatic disease in the chest.
2. Since 07/18/2020, hepatic metastasis are felt to be relatively
similar.
3. No extrahepatic metastatic disease within the abdomen or pelvis.
4. High rectal primary, slightly less distinct today.
5. Aortic atherosclerosis ([id number].P), coronary artery
atherosclerosis and emphysema ([id number].I).
6. Similar right apical pulmonary nodule, indeterminate but favored
to be benign.
